# Patient Record
Sex: Female | Born: 1960 | Race: Black or African American | Hispanic: No | Smoking: Never smoker
Health system: Southern US, Community
[De-identification: ages and names within clinical notes are randomized; demographics above are authoritative.]

## PROBLEM LIST (undated history)

## (undated) DIAGNOSIS — Z8619 Personal history of other infectious and parasitic diseases: Secondary | ICD-10-CM

## (undated) DIAGNOSIS — I2510 Atherosclerotic heart disease of native coronary artery without angina pectoris: Secondary | ICD-10-CM

## (undated) DIAGNOSIS — E785 Hyperlipidemia, unspecified: Secondary | ICD-10-CM

## (undated) DIAGNOSIS — F419 Anxiety disorder, unspecified: Secondary | ICD-10-CM

## (undated) DIAGNOSIS — I10 Essential (primary) hypertension: Secondary | ICD-10-CM

## (undated) DIAGNOSIS — F32A Depression, unspecified: Secondary | ICD-10-CM

## (undated) DIAGNOSIS — E0500 Thyrotoxicosis with diffuse goiter without thyrotoxic crisis or storm: Secondary | ICD-10-CM

## (undated) DIAGNOSIS — R87619 Unspecified abnormal cytological findings in specimens from cervix uteri: Secondary | ICD-10-CM

## (undated) DIAGNOSIS — G43909 Migraine, unspecified, not intractable, without status migrainosus: Secondary | ICD-10-CM

## (undated) HISTORY — DX: Essential (primary) hypertension: I10

## (undated) HISTORY — DX: Atherosclerotic heart disease of native coronary artery without angina pectoris: I25.10

## (undated) HISTORY — PX: TUBAL LIGATION: SHX77

## (undated) HISTORY — DX: Unspecified abnormal cytological findings in specimens from cervix uteri: R87.619

## (undated) HISTORY — DX: Migraine, unspecified, not intractable, without status migrainosus: G43.909

## (undated) HISTORY — DX: Anxiety disorder, unspecified: F41.9

## (undated) HISTORY — DX: Depression, unspecified: F32.A

## (undated) HISTORY — DX: Personal history of other infectious and parasitic diseases: Z86.19

## (undated) HISTORY — DX: Hyperlipidemia, unspecified: E78.5

---

## 1997-10-24 ENCOUNTER — Ambulatory Visit (HOSPITAL_COMMUNITY): Admission: RE | Admit: 1997-10-24 | Discharge: 1997-10-24 | Payer: Self-pay | Admitting: Obstetrics and Gynecology

## 2012-06-14 LAB — HM MAMMOGRAPHY

## 2012-06-14 LAB — HM PAP SMEAR

## 2015-05-04 ENCOUNTER — Encounter (HOSPITAL_COMMUNITY): Payer: Self-pay

## 2015-05-04 ENCOUNTER — Emergency Department (HOSPITAL_COMMUNITY): Payer: Self-pay

## 2015-05-04 ENCOUNTER — Emergency Department (HOSPITAL_COMMUNITY)
Admission: EM | Admit: 2015-05-04 | Discharge: 2015-05-04 | Disposition: A | Discharge status: Home / Self Care | Payer: Self-pay | Attending: Emergency Medicine | Admitting: Emergency Medicine

## 2015-05-04 DIAGNOSIS — I10 Essential (primary) hypertension: Secondary | ICD-10-CM | POA: Insufficient documentation

## 2015-05-04 DIAGNOSIS — Z79899 Other long term (current) drug therapy: Secondary | ICD-10-CM | POA: Insufficient documentation

## 2015-05-04 DIAGNOSIS — G43811 Other migraine, intractable, with status migrainosus: Secondary | ICD-10-CM | POA: Insufficient documentation

## 2015-05-04 MED ORDER — METOPROLOL TARTRATE 50 MG PO TABS: 50.0000 mg | ORAL_TABLET | Freq: Two times a day (BID) | ORAL | Status: DC

## 2015-05-04 MED ORDER — SODIUM CHLORIDE 0.9 % IV SOLN
1000.0000 mL | Freq: Once | INTRAVENOUS | Status: AC
  Administered 2015-05-04: 1000 mL via INTRAVENOUS

## 2015-05-04 MED ORDER — LISINOPRIL 10 MG PO TABS
10.0000 mg | ORAL_TABLET | Freq: Once | ORAL | Status: AC
  Administered 2015-05-04: 10 mg via ORAL
  Filled 2015-05-04: qty 1

## 2015-05-04 MED ORDER — DIPHENHYDRAMINE HCL 50 MG/ML IJ SOLN
25.0000 mg | Freq: Once | INTRAMUSCULAR | Status: AC
  Administered 2015-05-04: 25 mg via INTRAVENOUS
  Filled 2015-05-04: qty 1

## 2015-05-04 MED ORDER — HYDROCODONE-ACETAMINOPHEN 5-325 MG PO TABS: 1.0000 | ORAL_TABLET | ORAL | Status: DC | PRN

## 2015-05-04 MED ORDER — MORPHINE SULFATE (PF) 4 MG/ML IV SOLN
4.0000 mg | Freq: Once | INTRAVENOUS | Status: AC
  Administered 2015-05-04: 4 mg via INTRAVENOUS
  Filled 2015-05-04: qty 1

## 2015-05-04 MED ORDER — ONDANSETRON 4 MG PO TBDP: 4.0000 mg | ORAL_TABLET | ORAL | Status: DC | PRN

## 2015-05-04 MED ORDER — METOPROLOL TARTRATE 25 MG PO TABS
50.0000 mg | ORAL_TABLET | Freq: Once | ORAL | Status: AC
  Administered 2015-05-04: 50 mg via ORAL
  Filled 2015-05-04: qty 2

## 2015-05-04 MED ORDER — HYDRALAZINE HCL 20 MG/ML IJ SOLN
10.0000 mg | Freq: Once | INTRAMUSCULAR | Status: AC
  Administered 2015-05-04: 10 mg via INTRAVENOUS
  Filled 2015-05-04: qty 1

## 2015-05-04 MED ORDER — ONDANSETRON HCL 4 MG/2ML IJ SOLN
4.0000 mg | Freq: Once | INTRAMUSCULAR | Status: AC
  Administered 2015-05-04: 4 mg via INTRAVENOUS
  Filled 2015-05-04: qty 2

## 2015-05-04 MED ORDER — SODIUM CHLORIDE 0.9 % IV SOLN
1000.0000 mL | INTRAVENOUS | Status: DC
  Administered 2015-05-04: 1000 mL via INTRAVENOUS

## 2015-05-04 MED ORDER — AMITRIPTYLINE HCL 100 MG PO TABS: 100.0000 mg | ORAL_TABLET | Freq: Every day | ORAL | Status: DC

## 2015-05-04 MED ORDER — AMLODIPINE BESYLATE 5 MG PO TABS
5.0000 mg | ORAL_TABLET | Freq: Once | ORAL | Status: AC
  Administered 2015-05-04: 5 mg via ORAL
  Filled 2015-05-04: qty 1

## 2015-05-04 MED ORDER — METOCLOPRAMIDE HCL 5 MG/ML IJ SOLN
10.0000 mg | Freq: Once | INTRAMUSCULAR | Status: AC
  Administered 2015-05-04: 10 mg via INTRAVENOUS
  Filled 2015-05-04: qty 2

## 2015-05-04 MED ORDER — LISINOPRIL 10 MG PO TABS: 10.0000 mg | ORAL_TABLET | Freq: Every day | ORAL | Status: DC

## 2015-05-04 MED ORDER — HYDROMORPHONE HCL 1 MG/ML IJ SOLN
1.0000 mg | Freq: Once | INTRAMUSCULAR | Status: AC
  Administered 2015-05-04: 1 mg via INTRAVENOUS
  Filled 2015-05-04: qty 1

## 2015-05-04 NOTE — ED Provider Notes (Signed)
CSN: [id number]     Arrival date & time 05/04/15  0610 History   First MD Initiated Contact with Patient 05/04/15 [phone]     Chief Complaint  Patient presents with  . Migraine     (Consider location/radiation/quality/duration/timing/severity/associated sxs/prior Treatment) HPI Patient reports that she was awakened yesterday morning with a migraine headache. She states the headache is generalized and severe. She reports her blood pressure was also elevated. The patient reports that she gets migraines independent of her blood pressure being elevated and that has been a long-term diagnosis for her. She does describe this as a typical migraine. She has had nausea and vomiting in association with this as well as light sensitivity. She denies any gait dysfunction, focal weakness or numbness. The patient reports that due to lack of insurance coverage she has gone to taking her blood pressure medications every other day to try to cover the gap. She reports by doing that, her blood pressures usually fairly well controlled. She takes clonidine on an as-needed basis and otherwise takes amlodipine and metoprolol. Her migraine medication is diclofenac. She denies chest pain or shortness of breath. No abdominal pain. No lower extremity swelling or pain. Past Medical History  Diagnosis Date  . Migraines   . Hypertension    History reviewed. No pertinent past surgical history. History reviewed. No pertinent family history. Social History  Substance Use Topics  . Smoking status: Never Smoker   . Smokeless tobacco: None  . Alcohol Use: No   OB History    No data available     Review of Systems 10 Systems reviewed and are negative for acute change except as noted in the HPI.    Allergies  Review of patient's allergies indicates no known allergies.  Home Medications   Prior to Admission medications   Medication Sig Start Date End Date Taking? Authorizing Provider  amitriptyline (ELAVIL) 100 MG  tablet Take 100 mg by mouth at bedtime.   Yes Historical Provider, MD  amitriptyline (ELAVIL) 100 MG tablet Take 1 tablet (100 mg total) by mouth at bedtime. 05/04/15   Arby BarretteMarcy Marissa Weaver, MD  amLODipine (NORVASC) 5 MG tablet Take 5 mg by mouth daily.   Yes Historical Provider, MD  cloNIDine (CATAPRES) 0.1 MG tablet Take 0.1 mg by mouth daily as needed (for hypertension).   Yes Historical Provider, MD  diclofenac (VOLTAREN) 50 MG EC tablet Take 50 mg by mouth 2 (two) times daily as needed for moderate pain.   Yes Historical Provider, MD  divalproex (DEPAKOTE) 250 MG DR tablet Take 250 mg by mouth 2 (two) times daily.   Yes Historical Provider, MD  HYDROcodone-acetaminophen (NORCO/VICODIN) 5-325 MG tablet Take 1-2 tablets by mouth every 4 (four) hours as needed for moderate pain or severe pain. 05/04/15   Arby BarretteMarcy Ryen Rhames, MD  lisinopril (PRINIVIL,ZESTRIL) 10 MG tablet Take 1 tablet (10 mg total) by mouth daily. 05/04/15   Arby BarretteMarcy Denay Pleitez, MD  metoprolol (LOPRESSOR) 50 MG tablet Take 1 tablet (50 mg total) by mouth 2 (two) times daily. 05/04/15   Arby BarretteMarcy Genny Caulder, MD  metoprolol succinate (TOPROL-XL) 50 MG 24 hr tablet Take 50 mg by mouth daily. Take with or immediately following a meal.   Yes Historical Provider, MD  ondansetron (ZOFRAN ODT) 4 MG disintegrating tablet Take 1 tablet (4 mg total) by mouth every 4 (four) hours as needed for nausea or vomiting. 05/04/15   Arby BarretteMarcy Lucca Greggs, MD   BP 136/80 mmHg  Pulse 78  Temp(Src) 98.1 F (36.7 C) (  Oral)  Resp 16  Ht  (1.702 m)  Wt 140 lb (63.504 kg)  BMI 21.92 kg/m2  SpO2 95% Physical Exam  Constitutional: She is oriented to person, place, and time. She appears well-developed and well-nourished.  Patient is lying supine on her right side. She appears uncomfortable. She is alert and appropriate. She is nontoxic. No respiratory distress.  HENT:  Head: Normocephalic and atraumatic.  Right Ear: External ear normal.  Left Ear: External ear normal.   Nose: Nose normal.  Mouth/Throat: Oropharynx is clear and moist.  Eyes: EOM are normal. Pupils are equal, round, and reactive to light.  Neck: Neck supple.  Cardiovascular: Normal rate, regular rhythm, normal heart sounds and intact distal pulses.   Pulmonary/Chest: Effort normal and breath sounds normal.  Abdominal: Soft. Bowel sounds are normal. She exhibits no distension. There is no tenderness.  Musculoskeletal: Normal range of motion. She exhibits no edema or tenderness.  Neurological: She is alert and oriented to person, place, and time. She has normal strength. No cranial nerve deficit. She exhibits normal muscle tone. Coordination normal. GCS eye subscore is 4. GCS verbal subscore is 5. GCS motor subscore is 6.  Skin: Skin is warm, dry and intact.  Psychiatric: She has a normal mood and affect.    ED Course  Procedures (including critical care time) Labs Review Labs Reviewed - No data to display  Imaging Review No results found. I have personally reviewed and evaluated these images and lab results as part of my medical decision-making.   EKG Interpretation None     08:15 patient recheck. Patient has just received ordered medications thus no change yet. Recheck 09:00. Patient denies any significant improvement. Will add narcotic pain medication and additional antihypertensive as well as CT head. She is alert and appropriate without confusion. Still appears to be in pain. Recheck 10:35 patient reports headache has improved significantly now. Mental status clear. CT head negative Recheck 12:40 patient now starting to feel a significant improved. She feels this point that her migraine actually be resolving. MDM   Final diagnoses:  Essential hypertension  Other migraine with status migrainosus, intractable   Patient reports a similar episodes in the past. She identifies a history of both migraines as well as hypertension. Due to financial constraints, the patient has been  taking her blood pressure medications every other day. At this time she does present with headache but no neurologic dysfunction. Due to the patient's hypertension CT head was obtained and found to be negative. Treatment was approached both by managing hypertension as well as pain. Patient's mental status remained clear and once blood pressure and pain were managed, symptoms resolved. At this time she has been provided with the resource guide for community medical resources. Patient is encouraged to work with case management to identify weight have adequate blood pressure medications on a regular basis. I have filled all medications as closely as possible using the $4 list to assist the patient through this next month. She is counseled on signs and symptoms for which return.    Arby Barrette, MD 05/14/15 [phone]

## 2015-05-04 NOTE — ED Notes (Signed)
Patient transported to CT 

## 2015-05-04 NOTE — ED Notes (Signed)
Pt is in stable condition upon d/c and ambulates from ED. 

## 2015-05-04 NOTE — Discharge Instructions (Signed)
Hypertension Hypertension, commonly called high blood pressure, is when the force of blood pumping through your arteries is too strong. Your arteries are the blood vessels that carry blood from your heart throughout your body. A blood pressure reading consists of a higher number over a lower number, such as 110/72. The higher number (systolic) is the pressure inside your arteries when your heart pumps. The lower number (diastolic) is the pressure inside your arteries when your heart relaxes. Ideally you want your blood pressure below 120/80. Hypertension forces your heart to work harder to pump blood. Your arteries may become narrow or stiff. Having untreated or uncontrolled hypertension can cause heart attack, stroke, kidney disease, and other problems. RISK FACTORS Some risk factors for high blood pressure are controllable. Others are not.  Risk factors you cannot control include:   Race. You may be at higher risk if you are African American.  Age. Risk increases with age.  Gender. Men are at higher risk than women before age 45 years. After age 65, women are at higher risk than men. Risk factors you can control include:  Not getting enough exercise or physical activity.  Being overweight.  Getting too much fat, sugar, calories, or salt in your diet.  Drinking too much alcohol. SIGNS AND SYMPTOMS Hypertension does not usually cause signs or symptoms. Extremely high blood pressure (hypertensive crisis) may cause headache, anxiety, shortness of breath, and nosebleed. DIAGNOSIS To check if you have hypertension, your health care provider will measure your blood pressure while you are seated, with your arm held at the level of your heart. It should be measured at least twice using the same arm. Certain conditions can cause a difference in blood pressure between your right and left arms. A blood pressure reading that is higher than normal on one occasion does not mean that you need treatment. If  it is not clear whether you have high blood pressure, you may be asked to return on a different day to have your blood pressure checked again. Or, you may be asked to monitor your blood pressure at home for 1 or more weeks. TREATMENT Treating high blood pressure includes making lifestyle changes and possibly taking medicine. Living a healthy lifestyle can help lower high blood pressure. You may need to change some of your habits. Lifestyle changes may include:  Following the DASH diet. This diet is high in fruits, vegetables, and whole grains. It is low in salt, red meat, and added sugars.  Keep your sodium intake below 2,300 mg per day.  Getting at least 30-45 minutes of aerobic exercise at least 4 times per week.  Losing weight if necessary.  Not smoking.  Limiting alcoholic beverages.  Learning ways to reduce stress. Your health care provider may prescribe medicine if lifestyle changes are not enough to get your blood pressure under control, and if one of the following is true:  You are 18-59 years of age and your systolic blood pressure is above 140.  You are 60 years of age or older, and your systolic blood pressure is above 150.  Your diastolic blood pressure is above 90.  You have diabetes, and your systolic blood pressure is over 140 or your diastolic blood pressure is over 90.  You have kidney disease and your blood pressure is above 140/90.  You have heart disease and your blood pressure is above 140/90. Your personal target blood pressure may vary depending on your medical conditions, your age, and other factors. HOME CARE INSTRUCTIONS    Have your blood pressure rechecked as directed by your health care provider.   Take medicines only as directed by your health care provider. Follow the directions carefully. Blood pressure medicines must be taken as prescribed. The medicine does not work as well when you skip doses. Skipping doses also puts you at risk for  problems.  Do not smoke.   Monitor your blood pressure at home as directed by your health care provider. SEEK MEDICAL CARE IF:   You think you are having a reaction to medicines taken.  You have recurrent headaches or feel dizzy.  You have swelling in your ankles.  You have trouble with your vision. SEEK IMMEDIATE MEDICAL CARE IF:  You develop a severe headache or confusion.  You have unusual weakness, numbness, or feel faint.  You have severe chest or abdominal pain.  You vomit repeatedly.  You have trouble breathing. MAKE SURE YOU:   Understand these instructions.  Will watch your condition.  Will get help right away if you are not doing well or get worse.   This information is not intended to replace advice given to you by your health care provider. Make sure you discuss any questions you have with your health care provider.   Document Released: 05/31/2005 Document Revised: 10/15/2014 Document Reviewed: 03/23/2013 Elsevier Interactive Patient Education 2016 [employer]. Migraine Headache A migraine headache is an intense, throbbing pain on one or both sides of your head. A migraine can last for 30 minutes to several hours. CAUSES  The exact cause of a migraine headache is not always known. However, a migraine may be caused when nerves in the brain become irritated and release chemicals that cause inflammation. This causes pain. Certain things may also trigger migraines, such as:  Alcohol.  Smoking.  Stress.  Menstruation.  Aged cheeses.  Foods or drinks that contain nitrates, glutamate, aspartame, or tyramine.  Lack of sleep.  Chocolate.  Caffeine.  Hunger.  Physical exertion.  Fatigue.  Medicines used to treat chest pain (nitroglycerine), birth control pills, estrogen, and some blood pressure medicines. SIGNS AND SYMPTOMS  Pain on one or both sides of your head.  Pulsating or throbbing pain.  Severe pain that prevents daily  activities.  Pain that is aggravated by any physical activity.  Nausea, vomiting, or both.  Dizziness.  Pain with exposure to bright lights, loud noises, or activity.  General sensitivity to bright lights, loud noises, or smells. Before you get a migraine, you may get warning signs that a migraine is coming (aura). An aura may include:  Seeing flashing lights.  Seeing bright spots, halos, or zigzag lines.  Having tunnel vision or blurred vision.  Having feelings of numbness or tingling.  Having trouble talking.  Having muscle weakness. DIAGNOSIS  A migraine headache is often diagnosed based on:  Symptoms.  Physical exam.  A CT scan or MRI of your head. These imaging tests cannot diagnose migraines, but they can help rule out other causes of headaches. TREATMENT Medicines may be given for pain and nausea. Medicines can also be given to help prevent recurrent migraines.  HOME CARE INSTRUCTIONS  Only take over-the-counter or prescription medicines for pain or discomfort as directed by your health care provider. The use of long-term narcotics is not recommended.  Lie down in a dark, quiet room when you have a migraine.  Keep a journal to find out what may trigger your migraine headaches. For example, write down:  What you eat and drink.  How much  sleep you get.  Any change to your diet or medicines.  Limit alcohol consumption.  Quit smoking if you smoke.  Get 7-9 hours of sleep, or as recommended by your health care provider.  Limit stress.  Keep lights dim if bright lights bother you and make your migraines worse. SEEK IMMEDIATE MEDICAL CARE IF:   Your migraine becomes severe.  You have a fever.  You have a stiff neck.  You have vision loss.  You have muscular weakness or loss of muscle control.  You start losing your balance or have trouble walking.  You feel faint or pass out.  You have severe symptoms that are different from your first  symptoms. MAKE SURE YOU:   Understand these instructions.  Will watch your condition.  Will get help right away if you are not doing well or get worse.   This information is not intended to replace advice given to you by your health care provider. Make sure you discuss any questions you have with your health care provider.   Document Released: 05/31/2005 Document Revised: 06/21/2014 Document Reviewed: 02/05/2013 Elsevier Interactive Patient Education 2016 [employer].  Emergency Department Resource Guide 1) Find a Doctor and Pay Out of Pocket Although you won't have to find out who is covered by your insurance plan, it is a good idea to ask around and get recommendations. You will then need to call the office and see if the doctor you have chosen will accept you as a new patient and what types of options they offer for patients who are self-pay. Some doctors offer discounts or will set up payment plans for their patients who do not have insurance, but you will need to ask so you aren't surprised when you get to your appointment.  2) Contact Your Local Health Department Not all health departments have doctors that can see patients for sick visits, but many do, so it is worth a call to see if yours does. If you don't know where your local health department is, you can check in your phone book. The CDC also has a tool to help you locate your state's health department, and many state websites also have listings of all of their local health departments.  3) Find a Walk-in Clinic If your illness is not likely to be very severe or complicated, you may want to try a walk in clinic. These are popping up all over the country in pharmacies, drugstores, and shopping centers. They're usually staffed by nurse practitioners or physician assistants that have been trained to treat common illnesses and complaints. They're usually fairly quick and inexpensive. However, if you have serious medical issues or  chronic medical problems, these are probably not your best option.  No Primary Care Doctor: - Call Health Connect at  [phone] - they can help you locate a primary care doctor that  accepts your insurance, provides certain services, etc. - Physician Referral Service- [phone]  Chronic Pain Problems: Organization         Address  Phone   Notes  [facility]  [phone] Patients need to be referred by their primary care doctor.   Medication Assistance: Organization         Address  Phone   Notes  [facility] Medication [facility] [address] [place] [place], [place] [address] [phone] --Must be a resident of [facility] -- Must have NO insurance coverage whatsoever (no Medicaid/ Medicare, etc.) -- The pt. MUST  have a primary care doctor that directs their care regularly and follows them in the community   [facility]ist  [phone]   [employer]  [phone]    Agencies that provide inexpensive medical care: Organization         Address  Phone   Notes  [facility]  [phone]   [facility]    [phone]   [facility] [place] [address] [place], [place] [address] [phone]   [facility] [address] [place], [place] [phone]   Planned Parenthood    [phone]   [facility]    [phone]   Community Health and [facility]  [address], [place] Phone:  [phone], Fax:  [phone] Hours of Operation:  9 am - 6 pm, M-F.  Also accepts Medicaid/Medicare and self-pay.  [facility]  [address], [place] Phone: [phone], Fax: [phone]. Hours of Operation:  8:30 am - 5:30 pm, M-F.  Also accepts Medicaid and self-pay.  [facility] [place] [address] [place] Phone: [phone]   [facility] [address] Natasha Bence [place], [place]  [phone], Ext. [phone] Mondays & Thursdays: 7-9 AM.  First 15 patients are seen on a first come, first serve basis.    Medicaid-accepting [facility] Providers:  Organization         Address  Phone   Notes  [facility] [address], [place] [phone] Also accepts self-pay patients.  [facility] [address] Laurell Josephs [place], [place]  [phone]   [facility] [address], [place] [phone]   [facility] [address] [place] [phone]   Renaye Rakers [address], [place]   [phone] Only accepts [place] Access [place] patients after they have their name applied to their card.   Self-Pay (no insurance) in [facility]:  Organization         Address  Phone   Notes  Sickle Cell Patients, [facility] [address] [place], [place] [phone]   [facility] [address] [place], [place] [phone]   [facility]  [address], [place] [phone]   [facility]/Dr. Osei-Bonsu  [address], [place] or [address], [place] [phone] Phone number for both [place] and [place] locations is the same.  Urgent Medical and [facility] [address] [place] [phone]   [facility] [address], [place] or [address] [phone]   [facility] [address] [place] [phone], phone; [phone], fax Sees patients 1st and 3rd Saturday of every month.  Must not qualify for public or private insurance (i.e. Medicaid, Medicare, [place] Health Choice, Veterans' Benefits)  Household income should be no more than 200% of the poverty level The clinic cannot treat you if you are pregnant or think you are pregnant  Sexually transmitted  diseases are not treated at the clinic.    Dental Care: Organization         Address  Phone  Notes  [facility] Department of [facility] [address] [place],  [place] [phone] Accepts children up to age 52 who are enrolled in Medicaid or [place] Health Choice; pregnant women with a Medicaid card; and children who have applied for Medicaid or [place] Health Choice, but were declined, whose parents can pay a reduced fee at time of service.  [facility] [place]  [address], [place] [phone] Accepts children up to age 52 who are enrolled in [place] or [place] Health Choice; pregnant women with a Medicaid card; and children who have applied for Medicaid or [place] Health Choice, but were declined, whose parents can pay a reduced fee at time of service.  [facility]  [address], [place] [phone] Patients are seen by appointment only. Walk-ins are not accepted. [facility] will see patients 52 years of age and older. Monday - Tuesday (8am-5pm) Most Wednesdays (8:30-5pm) $30 per visit, cash only  [facility]  [address], [facility] [place] [phone] Patients are seen by appointment only. Walk-ins are not accepted. [facility] will see patients 52 years of age and older. One Wednesday Evening (Monthly: Volunteer Based).  $30 per visit, cash only  [employer] [facility] [facility]  [phone] for adults; Children under age 174, call Graduate Pediatric Dentistry at [phone]. Children aged 904-14, please call [phone] to request a pediatric application.  Dental services are provided in all areas of dental care including fillings, crowns and bridges, complete and partial dentures, implants, gum treatment, root canals, and extractions. Preventive care is also provided. Treatment is provided to both adults and children. Patients are selected via a  lottery and there is often a waiting list.   [facility] [address], [place]  [phone] [email or web address]   [facility] [address], [place], [place] [phone], Ext. [phone] Second and Fourth Thursday of each month, opens at 6:30 AM; Clinic ends at 9 AM.  Patients are seen on a first-come first-served basis, and a limited number are seen during each clinic.   [facility]  [address], [place], [place] [phone]   Eligibility Requirements You must have lived in [place], [place], or [place] counties for at least the last three months.   You cannot be eligible for state or federal sponsored [place]healthcare insurance, including CIGNAVeterans Administration, [place]icaid, or [place]icare.   You generally cannot be eligible for healthcare insurance through your employer.    How to apply: Eligibility screenings are held every Tuesday and Wednesday afternoon from 1:00 pm until 4:00 pm. You do not need an appointment for the interview!  [facility] [address], [place], [place] [phone]   [facility]  [phone]   [facility]  [phone]   [facility]  [phone]    Behavioral Health Resources in the Community: Intensive Outpatient Programs Organization         Address  Phone  Notes  [facility] [address], [place], [place] [phone]   [facility] [address], [place], [place] [phone]   [facility] [address], [place], [place]  [phone]   [facility] [address],  [place], [place] [phone] or [phone]   Substance Abuse Resources Organization         Address  Phone  Notes  [facility] Drug Services  [phone]   [facility]  [phone]   [facility] [place]  [phone]   Floydene Flock  [phone]   Residential &  Outpatient Substance Abuse Program  [phone]   Psychological Services Organization         Address  Phone  Notes  [facility] Health  [phone]   [facility]  [phone]   [facility] [address], [place] [phone] or [phone]    Mobile Crisis Teams Organization         Address  Phone  Notes  [facility], Mobile Crisis Care Unit  [phone]   [facility]  [address] [place], [place] [phone]   Doristine Locks [address] [place] [phone]    Self-Help/Support Groups Organization         Address  Phone             Notes  [facility] of [place] - variety of support groups  336- I7437963 Call for more information  Narcotics Anonymous (NA), [facility] [address], [employer] [place]  2 meetings at this location   Statistician         Address  Phone  Notes  [facility] [address],    [place]  [phone]   [facility]  [address], [place] [address], [place], [place] [phone]   [facility] [address] [place], [place] [phone] Admissions: 8am-3pm M-F  [facility] [address]    [place], [place] [phone]   [facility] [address] [place], [place], [place] [phone]   [facility] [address],  [place], [place] [phone]   [facility] - Intensive Outpatient [address]ure[address], [place], [place] [phone]   [facility] ([facility]iction [facility] [address] [place]  [place], [place] [phone] or [phone]   [facility] ([facility]) [address], [place] [employer] [place], [place] [phone] Accepts Medicaid  [facility] [address],  [place] [phone] Substance Abuse/Addiction Treatment   [facility] Organization          Address  Phone  Notes  [facility]  [phone]   Angie Fava, PhD [address] Ervin [place], [place]   [phone] or [phone]   [facility] Behavioral   [address] [place], [place] [phone]   [facility] [address], [place], [place] [phone] Insurance/Medicaid/sponsorship through [facility] and Families [address]                                    [place], [place] [phone] Therapy/tele-psych/case  [facility] [address], [place] [phone]    Dr. Lolly Mustache  [phone]   [facility]  [employer] [facility] Dept. 1) [address] [place] 2) [address], [place] 3)  [address], [place] [phone]  [phone]   [facility] Child Abuse Hotline (480) 321-0341 or [phone] (After Hours)

## 2015-05-04 NOTE — ED Notes (Addendum)
Pt states that she woke up yesterday morning with a migraine and has high blood pressure. Pt has a hx of both. Pt reports multiple episodes of vomiting over the night. Pt states that she has not taken anything for the migraine. Neuro in tact. Pt states that she has only been taking her BP medications every other day due to income/insurance reasons. Pt states that she was [place] for 1 week [place] ago [place] due to high BP.

## 2015-05-04 NOTE — ED Notes (Signed)
Waiting for case management to speak with pt rt inability to afford BP meds.

## 2015-05-29 ENCOUNTER — Ambulatory Visit: Payer: Self-pay | Admitting: Internal Medicine

## 2015-05-29 ENCOUNTER — Encounter (HOSPITAL_BASED_OUTPATIENT_CLINIC_OR_DEPARTMENT_OTHER): Payer: Self-pay | Admitting: *Deleted

## 2015-05-29 ENCOUNTER — Telehealth: Payer: Self-pay

## 2015-05-29 ENCOUNTER — Emergency Department (HOSPITAL_BASED_OUTPATIENT_CLINIC_OR_DEPARTMENT_OTHER)
Admission: EM | Admit: 2015-05-29 | Discharge: 2015-05-29 | Disposition: A | Discharge status: Home / Self Care | Payer: Self-pay | Attending: Emergency Medicine | Admitting: Emergency Medicine

## 2015-05-29 ENCOUNTER — Other Ambulatory Visit: Payer: Self-pay

## 2015-05-29 DIAGNOSIS — I10 Essential (primary) hypertension: Secondary | ICD-10-CM | POA: Insufficient documentation

## 2015-05-29 DIAGNOSIS — Z79899 Other long term (current) drug therapy: Secondary | ICD-10-CM | POA: Insufficient documentation

## 2015-05-29 DIAGNOSIS — R519 Headache, unspecified: Secondary | ICD-10-CM

## 2015-05-29 DIAGNOSIS — H53149 Visual discomfort, unspecified: Secondary | ICD-10-CM | POA: Insufficient documentation

## 2015-05-29 DIAGNOSIS — R51 Headache: Secondary | ICD-10-CM

## 2015-05-29 DIAGNOSIS — R112 Nausea with vomiting, unspecified: Secondary | ICD-10-CM | POA: Insufficient documentation

## 2015-05-29 LAB — CBC WITH DIFFERENTIAL/PLATELET
Basophils Absolute: 0 10*3/uL (ref 0.0–0.1)
Basophils Relative: 0 %
Eosinophils Absolute: 0 10*3/uL (ref 0.0–0.7)
Eosinophils Relative: 0 %
HCT: 37.2 % (ref 36.0–46.0)
Hemoglobin: 12.4 g/dL (ref 12.0–15.0)
Lymphocytes Relative: 12 %
Lymphs Abs: 0.6 10*3/uL — ABNORMAL LOW (ref 0.7–4.0)
MCH: 28.6 pg (ref 26.0–34.0)
MCHC: 33.3 g/dL (ref 30.0–36.0)
MCV: 85.9 fL (ref 78.0–100.0)
Monocytes Absolute: 0.3 10*3/uL (ref 0.1–1.0)
Monocytes Relative: 5 %
Neutro Abs: 4.5 10*3/uL (ref 1.7–7.7)
Neutrophils Relative %: 83 %
Platelets: 234 10*3/uL (ref 150–400)
RBC: 4.33 MIL/uL (ref 3.87–5.11)
RDW: 13.4 % (ref 11.5–15.5)
WBC: 5.4 10*3/uL (ref 4.0–10.5)

## 2015-05-29 LAB — BASIC METABOLIC PANEL
Anion gap: 10 (ref 5–15)
BUN: 14 mg/dL (ref 6–20)
CO2: 24 mmol/L (ref 22–32)
Calcium: 9.2 mg/dL (ref 8.9–10.3)
Chloride: 105 mmol/L (ref 101–111)
Creatinine, Ser: 0.73 mg/dL (ref 0.44–1.00)
GFR calc Af Amer: >60 mL/min (ref >60)
GFR calc non Af Amer: >60 mL/min (ref >60)
Glucose, Bld: 123 mg/dL — ABNORMAL HIGH (ref 65–99)
Potassium: 3.4 mmol/L — ABNORMAL LOW (ref 3.5–5.1)
Sodium: 139 mmol/L (ref 135–145)

## 2015-05-29 MED ORDER — LABETALOL HCL 5 MG/ML IV SOLN
10.0000 mg | Freq: Once | INTRAVENOUS | Status: AC
  Administered 2015-05-29: 10 mg via INTRAVENOUS
  Filled 2015-05-29: qty 4

## 2015-05-29 MED ORDER — DIPHENHYDRAMINE HCL 50 MG/ML IJ SOLN
25.0000 mg | Freq: Once | INTRAMUSCULAR | Status: AC
Start: 2015-05-29 — End: 2015-05-29
  Administered 2015-05-29: 25 mg via INTRAVENOUS
  Filled 2015-05-29: qty 1

## 2015-05-29 MED ORDER — SODIUM CHLORIDE 0.9 % IV BOLUS (SEPSIS)
1000.0000 mL | Freq: Once | INTRAVENOUS | Status: AC
  Administered 2015-05-29: 1000 mL via INTRAVENOUS

## 2015-05-29 MED ORDER — KETOROLAC TROMETHAMINE 30 MG/ML IJ SOLN
30.0000 mg | Freq: Once | INTRAMUSCULAR | Status: AC
  Administered 2015-05-29: 30 mg via INTRAVENOUS
  Filled 2015-05-29: qty 1

## 2015-05-29 MED ORDER — PROCHLORPERAZINE EDISYLATE 5 MG/ML IJ SOLN
10.0000 mg | Freq: Once | INTRAMUSCULAR | Status: AC
  Administered 2015-05-29: 10 mg via INTRAVENOUS
  Filled 2015-05-29: qty 2

## 2015-05-29 MED ORDER — POTASSIUM CHLORIDE CRYS ER 20 MEQ PO TBCR
40.0000 meq | EXTENDED_RELEASE_TABLET | Freq: Once | ORAL | Status: AC
  Administered 2015-05-29: 40 meq via ORAL
  Filled 2015-05-29: qty 2

## 2015-05-29 NOTE — Telephone Encounter (Signed)
thx

## 2015-05-29 NOTE — ED Provider Notes (Signed)
CSN: [id number]     Arrival date & time 05/29/15  1014 History   First MD Initiated Contact with Patient 05/29/15 1040     Chief Complaint  Patient presents with  . Migraine     (Consider location/radiation/quality/duration/timing/severity/associated sxs/prior Treatment) HPI  54 year old female with a history of migraines and hypertension presents with a recurrent left-sided migraine this started in the middle the night. Was milder and has progressively worsened. Feels very similar to prior migraines. Patient has photophobia, nausea with vomiting, and the left-sided aching headache. Pain is in her left neck as well but she states this pretty typical. She denies weakness but states she has numbness in her right fingertips. She also endorses this is happened multiple times with her migraines. Her blood pressure has been in the 200s/100s while checking it at work. This is been ongoing for last 3 days. Denies chest pain or shortness of breath. She has been taking all the medicine she was given last time she was here on 11/30 for hypertension. She has a PCP appointment next month. No fevers or chills. She has migrated about once a month and this feels pretty typical.  Past Medical History  Diagnosis Date  . Migraines   . Hypertension    History reviewed. No pertinent past surgical history. History reviewed. No pertinent family history. Social History  Substance Use Topics  . Smoking status: Never Smoker   . Smokeless tobacco: None  . Alcohol Use: No   OB History    No data available     Review of Systems  Constitutional: Negative for fever.  Eyes: Positive for photophobia. Negative for visual disturbance.  Respiratory: Negative for shortness of breath.   Cardiovascular: Negative for chest pain.  Gastrointestinal: Positive for nausea and vomiting.  Neurological: Positive for numbness and headaches. Negative for weakness.  All other systems reviewed and are negative.     Allergies   Review of patient's allergies indicates no known allergies.  Home Medications   Prior to Admission medications   Medication Sig Start Date End Date Taking? Authorizing Provider  amitriptyline (ELAVIL) 100 MG tablet Take 1 tablet (100 mg total) by mouth at bedtime. 05/04/15   Arby Barrette, MD  amLODipine (NORVASC) 5 MG tablet Take 5 mg by mouth daily.    Historical Provider, MD  cloNIDine (CATAPRES) 0.1 MG tablet Take 0.1 mg by mouth daily as needed (for hypertension).    Historical Provider, MD  diclofenac (VOLTAREN) 50 MG EC tablet Take 50 mg by mouth 2 (two) times daily as needed for moderate pain.    Historical Provider, MD  divalproex (DEPAKOTE) 250 MG DR tablet Take 250 mg by mouth 2 (two) times daily.    Historical Provider, MD  HYDROcodone-acetaminophen (NORCO/VICODIN) 5-325 MG tablet Take 1-2 tablets by mouth every 4 (four) hours as needed for moderate pain or severe pain. 05/04/15   Arby Barrette, MD  lisinopril (PRINIVIL,ZESTRIL) 10 MG tablet Take 1 tablet (10 mg total) by mouth daily. 05/04/15   Arby Barrette, MD  metoprolol (LOPRESSOR) 50 MG tablet Take 1 tablet (50 mg total) by mouth 2 (two) times daily. 05/04/15   Arby Barrette, MD  ondansetron (ZOFRAN ODT) 4 MG disintegrating tablet Take 1 tablet (4 mg total) by mouth every 4 (four) hours as needed for nausea or vomiting. 05/04/15   Arby Barrette, MD   BP 180/123 mmHg  Pulse 93  Temp(Src) 98.1 F (36.7 C) (Oral)  Resp 16  Ht  (1.702 m)  Wt  140 lb (63.504 kg)  BMI 21.92 kg/m2 Physical Exam  Constitutional: She is oriented to person, place, and time. She appears well-developed and well-nourished. No distress.  HENT:  Head: Normocephalic and atraumatic.  Right Ear: External ear normal.  Left Ear: External ear normal.  Nose: Nose normal.  Eyes: EOM are normal. Pupils are equal, round, and reactive to light. Right eye exhibits no discharge. Left eye exhibits no discharge.  Neck: Normal range of motion. Neck  supple.  Cardiovascular: Normal rate, regular rhythm and normal heart sounds.   Pulmonary/Chest: Effort normal and breath sounds normal.  Abdominal: Soft. She exhibits no distension. There is no tenderness.  Neurological: She is alert and oriented to person, place, and time.  CN 2-12 grossly intact. 5/5 strength in all 4 extremities. Grossly normal sensation, including fingers. Normal finger to nose  Skin: Skin is warm and dry. She is not diaphoretic.  Nursing note and vitals reviewed.   ED Course  Procedures (including critical care time) Labs Review Labs Reviewed  BASIC METABOLIC PANEL - Abnormal; Notable for the following:    Potassium 3.4 (*)    Glucose, Bld 123 (*)    All other components within normal limits  CBC WITH DIFFERENTIAL/PLATELET - Abnormal; Notable for the following:    Lymphs Abs 0.6 (*)    All other components within normal limits    Imaging Review No results found. I have personally reviewed and evaluated these images and lab results as part of my medical decision-making.   EKG Interpretation None      MDM   Final diagnoses:  Left-sided headache  Hypertension, essential    Patient with recurrent left-sided, typical migraine headache with hypertension. She endorses taking all of the medicine she was prescribed just a few weeks ago. Patient has no neuro deficits on my exam. Low suspicion for a subarachnoid hemorrhage, stroke, or other bleed or infection. She states all the symptoms, including hypertension, are typical of her migraines. Good pain control with treatment in the ER. She was given a dose of labetalol given rising blood pressure and continued headache. Given she is now a symptomatically plan discharge with close follow-up with PCP as well as strict return precautions.    Pricilla LovelessScott Livy Ross, MD 05/29/15 1310

## 2015-05-29 NOTE — ED Notes (Signed)
Pt reports her usual migraine symptoms with photophobia and nausea x this am. Pt states her bp has also been high per her home machine over the last few days "210's".

## 2015-05-29 NOTE — Telephone Encounter (Signed)
New patient appt with Dr. Abner GreenspanBlyth scheduled 07/02/15.    Hx. Migraine, essential hypertension, recent ER visit 05/04/15-Essential HTN and Migraine  Pt came in for an acute visit with Dr. Drue NovelPaz today at 10:45 am with complaints of migraines.  Pt arrived early stating that she has a migraine, is not feeling well with elevated blood pressure.   Husband and daughter present.  Pt was brought into procedure room for an assessment by nurse.  Pt states she woke up early this morning around 2 am with a migraine headache, nausea, dizziness and elevated blood pressure.   She reports the same now.    Vomited once in procedures room. Emesis: thick clear/greenish sputum.  Pt states has not eaten or drank anything this morning.   BP: 208/129.  HR: 86.  Pt is alert and oriented x 4.  Reported the same to Dr. Drue NovelPaz.  Verbal order given to escort pt to ER.  Pt was escorted to ER via wheelchair.

## 2015-05-30 NOTE — Telephone Encounter (Signed)
thanks

## 2015-07-02 ENCOUNTER — Ambulatory Visit: Payer: Self-pay | Admitting: Family Medicine

## 2015-07-04 ENCOUNTER — Telehealth: Payer: Self-pay | Admitting: Family Medicine

## 2015-07-04 NOTE — Telephone Encounter (Signed)
NO charge no show

## 2015-07-04 NOTE — Telephone Encounter (Signed)
Pt was no show for new pt appt 07/02/15 10:00am, pt has not rescheduled, charge or no charge? Ok to reschedule with you if pt calls in?

## 2015-07-08 NOTE — Telephone Encounter (Signed)
Pt's brother called in to schedule an appt with Dr. Abner Greenspan. Informed him that Dr. B is no longer accepting new patients. She (pt) is more then welcome to get established with a different provider. Suggested one of the new providers to him. He says that he will speak with his sister and let us know.

## 2015-07-16 ENCOUNTER — Telehealth: Payer: Self-pay | Admitting: Family Medicine

## 2015-07-16 NOTE — Telephone Encounter (Signed)
Pt's brother Erby Pian is a current pt of Dr. Leonard Schwartz. He says that during his appt he spoke w/ provider about scheduling pt a new pt appt. He says that provider OKay'd it and asked front desk to schedule.

## 2015-07-28 ENCOUNTER — Telehealth: Payer: Self-pay | Admitting: General Practice

## 2015-07-28 ENCOUNTER — Encounter: Payer: Self-pay | Admitting: General Practice

## 2015-07-28 NOTE — Telephone Encounter (Signed)
Pre-visit phone call for pt completed. Chart updated to reflect.   

## 2015-07-29 ENCOUNTER — Ambulatory Visit (INDEPENDENT_AMBULATORY_CARE_PROVIDER_SITE_OTHER): Payer: 59 | Admitting: Family Medicine

## 2015-07-29 ENCOUNTER — Encounter: Payer: Self-pay | Admitting: Family Medicine

## 2015-07-29 VITALS — BP 162/110 | HR 79 | Temp 98.0°F | Ht 67.0 in | Wt 155.4 lb

## 2015-07-29 DIAGNOSIS — I10 Essential (primary) hypertension: Secondary | ICD-10-CM | POA: Diagnosis not present

## 2015-07-29 DIAGNOSIS — G43809 Other migraine, not intractable, without status migrainosus: Secondary | ICD-10-CM

## 2015-07-29 DIAGNOSIS — R87619 Unspecified abnormal cytological findings in specimens from cervix uteri: Secondary | ICD-10-CM

## 2015-07-29 DIAGNOSIS — Z8619 Personal history of other infectious and parasitic diseases: Secondary | ICD-10-CM | POA: Insufficient documentation

## 2015-07-29 MED ORDER — PROMETHAZINE HCL 25 MG PO TABS: 25.0000 mg | ORAL_TABLET | Freq: Three times a day (TID) | ORAL | Status: DC | PRN

## 2015-07-29 MED ORDER — METOPROLOL SUCCINATE ER 200 MG PO TB24: 200.0000 mg | ORAL_TABLET | Freq: Every day | ORAL | Status: DC

## 2015-07-29 MED ORDER — HYDROCODONE-ACETAMINOPHEN 5-325 MG PO TABS: 1.0000 | ORAL_TABLET | ORAL | Status: DC | PRN

## 2015-07-29 MED ORDER — BUTALBITAL-ASA-CAFFEINE 50-325-40 MG PO CAPS: 1.0000 | ORAL_CAPSULE | Freq: Two times a day (BID) | ORAL | Status: DC | PRN

## 2015-07-29 MED ORDER — AMITRIPTYLINE HCL 100 MG PO TABS: 100.0000 mg | ORAL_TABLET | Freq: Every day | ORAL | Status: DC

## 2015-07-29 MED ORDER — LISINOPRIL 10 MG PO TABS: 10.0000 mg | ORAL_TABLET | Freq: Every day | ORAL | Status: DC

## 2015-07-29 MED FILL — METOPROLOL SUCC ER 200 MG T: 200 | 30 days supply | Qty: 30 | Fill #0

## 2015-07-29 MED FILL — PROMETHAZINE 25 MG TABLET: 25 | 10 days supply | Qty: 30 | Fill #0

## 2015-07-29 MED FILL — HYDROCODON-APAP 5-325: 5-325 | 3 days supply | Qty: 30 | Fill #0

## 2015-07-29 MED FILL — BUTALBITAL COMPOUND CAPSULE: 50-325-40 | 15 days supply | Qty: 30 | Fill #0

## 2015-07-29 MED FILL — LISINOPRIL 10 MG TABLET: 10 | 30 days supply | Qty: 30 | Fill #0

## 2015-07-29 MED FILL — AMITRIPTYLINE HCL 100 MG TA: 100 | 30 days supply | Qty: 30 | Fill #0

## 2015-07-29 NOTE — Patient Instructions (Signed)
Call insurance to see if they pay for Zostavax/Shingles shot in your 89s.    Preventive Care for Adults, Female A healthy lifestyle and preventive care can promote health and wellness. Preventive health guidelines for women include the following key practices.  A routine yearly physical is a good way to check with your health care provider about your health and preventive screening. It is a chance to share any concerns and updates on your health and to receive a thorough exam.  Visit your dentist for a routine exam and preventive care every 6 months. Brush your teeth twice a day and floss once a day. Good oral hygiene prevents tooth decay and gum disease.  The frequency of eye exams is based on your age, health, family medical history, use of contact lenses, and other factors. Follow your health care provider's recommendations for frequency of eye exams.  Eat a healthy diet. Foods like vegetables, fruits, whole grains, low-fat dairy products, and lean protein foods contain the nutrients you need without too many calories. Decrease your intake of foods high in solid fats, added sugars, and salt. Eat the right amount of calories for you.Get information about a proper diet from your health care provider, if necessary.  Regular physical exercise is one of the most important things you can do for your health. Most adults should get at least 150 minutes of moderate-intensity exercise (any activity that increases your heart rate and causes you to sweat) each week. In addition, most adults need muscle-strengthening exercises on 2 or more days a week.  Maintain a healthy weight. The body mass index (BMI) is a screening tool to identify possible weight problems. It provides an estimate of body fat based on height and weight. Your health care provider can find your BMI and can help you achieve or maintain a healthy weight.For adults 20 years and older:  A BMI below 18.5 is considered underweight.  A BMI  of 18.5 to 24.9 is normal.  A BMI of 25 to 29.9 is considered overweight.  A BMI of 30 and above is considered obese.  Maintain normal blood lipids and cholesterol levels by exercising and minimizing your intake of saturated fat. Eat a balanced diet with plenty of fruit and vegetables. Blood tests for lipids and cholesterol should begin at age 29 and be repeated every 5 years. If your lipid or cholesterol levels are high, you are over 50, or you are at high risk for heart disease, you may need your cholesterol levels checked more frequently.Ongoing high lipid and cholesterol levels should be treated with medicines if diet and exercise are not working.  If you smoke, find out from your health care provider how to quit. If you do not use tobacco, do not start.  Lung cancer screening is recommended for adults aged 78-80 years who are at high risk for developing lung cancer because of a history of smoking. A yearly low-dose CT scan of the lungs is recommended for people who have at least a 30-pack-year history of smoking and are a current smoker or have quit within the past 15 years. A pack year of smoking is smoking an average of 1 pack of cigarettes a day for 1 year (for example: 1 pack a day for 30 years or 2 packs a day for 15 years). Yearly screening should continue until the smoker has stopped smoking for at least 15 years. Yearly screening should be stopped for people who develop a health problem that would prevent them from  having lung cancer treatment.  If you are pregnant, do not drink alcohol. If you are breastfeeding, be very cautious about drinking alcohol. If you are not pregnant and choose to drink alcohol, do not have more than 1 drink per day. One drink is considered to be 12 ounces (355 mL) of beer, 5 ounces (148 mL) of wine, or 1.5 ounces (44 mL) of liquor.  Avoid use of street drugs. Do not share needles with anyone. Ask for help if you need support or instructions about stopping the  use of drugs.  High blood pressure causes heart disease and increases the risk of stroke. Your blood pressure should be checked at least every 1 to 2 years. Ongoing high blood pressure should be treated with medicines if weight loss and exercise do not work.  If you are 57-27 years old, ask your health care provider if you should take aspirin to prevent strokes.  Diabetes screening is done by taking a blood sample to check your blood glucose level after you have not eaten for a certain period of time (fasting). If you are not overweight and you do not have risk factors for diabetes, you should be screened once every 3 years starting at age 31. If you are overweight or obese and you are 64-74 years of age, you should be screened for diabetes every year as part of your cardiovascular risk assessment.  Breast cancer screening is essential preventive care for women. You should practice "breast self-awareness." This means understanding the normal appearance and feel of your breasts and may include breast self-examination. Any changes detected, no matter how small, should be reported to a health care provider. Women in their 2s and 30s should have a clinical breast exam (CBE) by a health care provider as part of a regular health exam every 1 to 3 years. After age 84, women should have a CBE every year. Starting at age 53, women should consider having a mammogram (breast X-ray test) every year. Women who have a family history of breast cancer should talk to their health care provider about genetic screening. Women at a high risk of breast cancer should talk to their health care providers about having an MRI and a mammogram every year.  Breast cancer gene (BRCA)-related cancer risk assessment is recommended for women who have family members with BRCA-related cancers. BRCA-related cancers include breast, ovarian, tubal, and peritoneal cancers. Having family members with these cancers may be associated with an  increased risk for harmful changes (mutations) in the breast cancer genes BRCA1 and BRCA2. Results of the assessment will determine the need for genetic counseling and BRCA1 and BRCA2 testing.  Your health care provider may recommend that you be screened regularly for cancer of the pelvic organs (ovaries, uterus, and vagina). This screening involves a pelvic examination, including checking for microscopic changes to the surface of your cervix (Pap test). You may be encouraged to have this screening done every 3 years, beginning at age 19.  For women ages 85-65, health care providers may recommend pelvic exams and Pap testing every 3 years, or they may recommend the Pap and pelvic exam, combined with testing for human papilloma virus (HPV), every 5 years. Some types of HPV increase your risk of cervical cancer. Testing for HPV may also be done on women of any age with unclear Pap test results.  Other health care providers may not recommend any screening for nonpregnant women who are considered low risk for pelvic cancer and who do  not have symptoms. Ask your health care provider if a screening pelvic exam is right for you.  If you have had past treatment for cervical cancer or a condition that could lead to cancer, you need Pap tests and screening for cancer for at least 20 years after your treatment. If Pap tests have been discontinued, your risk factors (such as having a new sexual partner) need to be reassessed to determine if screening should resume. Some women have medical problems that increase the chance of getting cervical cancer. In these cases, your health care provider may recommend more frequent screening and Pap tests.  Colorectal cancer can be detected and often prevented. Most routine colorectal cancer screening begins at the age of 24 years and continues through age 45 years. However, your health care provider may recommend screening at an earlier age if you have risk factors for colon  cancer. On a yearly basis, your health care provider may provide home test kits to check for hidden blood in the stool. Use of a small camera at the end of a tube, to directly examine the colon (sigmoidoscopy or colonoscopy), can detect the earliest forms of colorectal cancer. Talk to your health care provider about this at age 46, when routine screening begins. Direct exam of the colon should be repeated every 5-10 years through age 63 years, unless early forms of precancerous polyps or small growths are found.  People who are at an increased risk for hepatitis B should be screened for this virus. You are considered at high risk for hepatitis B if:  You were born in a country where hepatitis B occurs often. Talk with your health care provider about which countries are considered high risk.  Your parents were born in a high-risk country and you have not received a shot to protect against hepatitis B (hepatitis B vaccine).  You have HIV or AIDS.  You use needles to inject street drugs.  You live with, or have sex with, someone who has hepatitis B.  You get hemodialysis treatment.  You take certain medicines for conditions like cancer, organ transplantation, and autoimmune conditions.  Hepatitis C blood testing is recommended for all people born from [birth year] through 1965 and any individual with known risks for hepatitis C.  Practice safe sex. Use condoms and avoid high-risk sexual practices to reduce the spread of sexually transmitted infections (STIs). STIs include gonorrhea, chlamydia, syphilis, trichomonas, herpes, HPV, and human immunodeficiency virus (HIV). Herpes, HIV, and HPV are viral illnesses that have no cure. They can result in disability, cancer, and death.  You should be screened for sexually transmitted illnesses (STIs) including gonorrhea and chlamydia if:  You are sexually active and are younger than 24 years.  You are older than 24 years and your health care provider tells you  that you are at risk for this type of infection.  Your sexual activity has changed since you were last screened and you are at an increased risk for chlamydia or gonorrhea. Ask your health care provider if you are at risk.  If you are at risk of being infected with HIV, it is recommended that you take a prescription medicine daily to prevent HIV infection. This is called preexposure prophylaxis (PrEP). You are considered at risk if:  You are sexually active and do not regularly use condoms or know the HIV status of your partner(s).  You take drugs by injection.  You are sexually active with a partner who has HIV.  Talk with your  health care provider about whether you are at high risk of being infected with HIV. If you choose to begin PrEP, you should first be tested for HIV. You should then be tested every 3 months for as long as you are taking PrEP.  Osteoporosis is a disease in which the bones lose minerals and strength with aging. This can result in serious bone fractures or breaks. The risk of osteoporosis can be identified using a bone density scan. Women ages 68 years and over and women at risk for fractures or osteoporosis should discuss screening with their health care providers. Ask your health care provider whether you should take a calcium supplement or vitamin D to reduce the rate of osteoporosis.  Menopause can be associated with physical symptoms and risks. Hormone replacement therapy is available to decrease symptoms and risks. You should talk to your health care provider about whether hormone replacement therapy is right for you.  Use sunscreen. Apply sunscreen liberally and repeatedly throughout the day. You should seek shade when your shadow is shorter than you. Protect yourself by wearing long sleeves, pants, a wide-brimmed hat, and sunglasses year round, whenever you are outdoors.  Once a month, do a whole body skin exam, using a mirror to look at the skin on your back. Tell  your health care provider of new moles, moles that have irregular borders, moles that are larger than a pencil eraser, or moles that have changed in shape or color.  Stay current with required vaccines (immunizations).  Influenza vaccine. All adults should be immunized every year.  Tetanus, diphtheria, and acellular pertussis (Td, Tdap) vaccine. Pregnant women should receive 1 dose of Tdap vaccine during each pregnancy. The dose should be obtained regardless of the length of time since the last dose. Immunization is preferred during the 27th-36th week of gestation. An adult who has not previously received Tdap or who does not know her vaccine status should receive 1 dose of Tdap. This initial dose should be followed by tetanus and diphtheria toxoids (Td) booster doses every 10 years. Adults with an unknown or incomplete history of completing a 3-dose immunization series with Td-containing vaccines should begin or complete a primary immunization series including a Tdap dose. Adults should receive a Td booster every 10 years.  Varicella vaccine. An adult without evidence of immunity to varicella should receive 2 doses or a second dose if she has previously received 1 dose. Pregnant females who do not have evidence of immunity should receive the first dose after pregnancy. This first dose should be obtained before leaving the health care facility. The second dose should be obtained 4-8 weeks after the first dose.  Human papillomavirus (HPV) vaccine. Females aged 13-26 years who have not received the vaccine previously should obtain the 3-dose series. The vaccine is not recommended for use in pregnant females. However, pregnancy testing is not needed before receiving a dose. If a female is found to be pregnant after receiving a dose, no treatment is needed. In that case, the remaining doses should be delayed until after the pregnancy. Immunization is recommended for any person with an immunocompromised  condition through the age of 21 years if she did not get any or all doses earlier. During the 3-dose series, the second dose should be obtained 4-8 weeks after the first dose. The third dose should be obtained 24 weeks after the first dose and 16 weeks after the second dose.  Zoster vaccine. One dose is recommended for adults aged 78 years  or older unless certain conditions are present.  Measles, mumps, and rubella (MMR) vaccine. Adults born before [birth year] generally are considered immune to measles and mumps. Adults born in [birth year] or later should have 1 or more doses of MMR vaccine unless there is a contraindication to the vaccine or there is laboratory evidence of immunity to each of the three diseases. A routine second dose of MMR vaccine should be obtained at least 28 days after the first dose for students attending postsecondary schools, health care workers, or international travelers. People who received inactivated measles vaccine or an unknown type of measles vaccine during 1963-1967 should receive 2 doses of MMR vaccine. People who received inactivated mumps vaccine or an unknown type of mumps vaccine before 1979 and are at high risk for mumps infection should consider immunization with 2 doses of MMR vaccine. For females of childbearing age, rubella immunity should be determined. If there is no evidence of immunity, females who are not pregnant should be vaccinated. If there is no evidence of immunity, females who are pregnant should delay immunization until after pregnancy. Unvaccinated health care workers born before [birth year] who lack laboratory evidence of measles, mumps, or rubella immunity or laboratory confirmation of disease should consider measles and mumps immunization with 2 doses of MMR vaccine or rubella immunization with 1 dose of MMR vaccine.  Pneumococcal 13-valent conjugate (PCV13) vaccine. When indicated, a person who is uncertain of his immunization history and has no record of immunization  should receive the PCV13 vaccine. All adults 79 years of age and older should receive this vaccine. An adult aged 88 years or older who has certain medical conditions and has not been previously immunized should receive 1 dose of PCV13 vaccine. This PCV13 should be followed with a dose of pneumococcal polysaccharide (PPSV23) vaccine. Adults who are at high risk for pneumococcal disease should obtain the PPSV23 vaccine at least 8 weeks after the dose of PCV13 vaccine. Adults older than 53 years of age who have normal immune system function should obtain the PPSV23 vaccine dose at least 1 year after the dose of PCV13 vaccine.  Pneumococcal polysaccharide (PPSV23) vaccine. When PCV13 is also indicated, PCV13 should be obtained first. All adults aged 71 years and older should be immunized. An adult younger than age 64 years who has certain medical conditions should be immunized. Any person who resides in a nursing home or long-term care facility should be immunized. An adult smoker should be immunized. People with an immunocompromised condition and certain other conditions should receive both PCV13 and PPSV23 vaccines. People with human immunodeficiency virus (HIV) infection should be immunized as soon as possible after diagnosis. Immunization during chemotherapy or radiation therapy should be avoided. Routine use of PPSV23 vaccine is not recommended for American Indians, [place], or people younger than 65 years unless there are medical conditions that require PPSV23 vaccine. When indicated, people who have unknown immunization and have no record of immunization should receive PPSV23 vaccine. One-time revaccination 5 years after the first dose of PPSV23 is recommended for people aged 19-64 years who have chronic kidney failure, nephrotic syndrome, asplenia, or immunocompromised conditions. People who received 1-2 doses of PPSV23 before age 19 years should receive another dose of PPSV23 vaccine at age 44 years  or later if at least 5 years have passed since the previous dose. Doses of PPSV23 are not needed for people immunized with PPSV23 at or after age 33 years.  Meningococcal vaccine. Adults with asplenia or persistent complement component  deficiencies should receive 2 doses of quadrivalent meningococcal conjugate (MenACWY-D) vaccine. The doses should be obtained at least 2 months apart. Microbiologists working with certain meningococcal bacteria, [place] recruits, people at risk during an outbreak, and people who travel to or live in countries with a high rate of meningitis should be immunized. A first-year college student up through age 63 years who is living in a residence hall should receive a dose if she did not receive a dose on or after her 16th birthday. Adults who have certain high-risk conditions should receive one or more doses of vaccine.  Hepatitis A vaccine. Adults who wish to be protected from this disease, have certain high-risk conditions, work with hepatitis A-infected animals, work in hepatitis A research labs, or travel to or work in countries with a high rate of hepatitis A should be immunized. Adults who were previously unvaccinated and who anticipate close contact with an international adoptee during the first 60 days after arrival in the [place] from a country with a high rate of hepatitis A should be immunized.  Hepatitis B vaccine. Adults who wish to be protected from this disease, have certain high-risk conditions, may be exposed to blood or other infectious body fluids, are household contacts or sex partners of hepatitis B positive people, are clients or workers in certain care facilities, or travel to or work in countries with a high rate of hepatitis B should be immunized.  Haemophilus influenzae type b (Hib) vaccine. A previously unvaccinated person with asplenia or sickle cell disease or having a scheduled splenectomy should receive 1 dose of Hib vaccine. Regardless of  previous immunization, a recipient of a hematopoietic stem cell transplant should receive a 3-dose series 6-12 months after her successful transplant. Hib vaccine is not recommended for adults with HIV infection. Preventive Services / Frequency Ages 53 to 87 years  Blood pressure check.** / Every 3-5 years.  Lipid and cholesterol check.** / Every 5 years beginning at age 58.  Clinical breast exam.** / Every 3 years for women in their 54s and 33s.  BRCA-related cancer risk assessment.** / For women who have family members with a BRCA-related cancer (breast, ovarian, tubal, or peritoneal cancers).  Pap test.** / Every 2 years from ages 68 through 43. Every 3 years starting at age 75 through age 55 or 44 with a history of 3 consecutive normal Pap tests.  HPV screening.** / Every 3 years from ages 28 through ages 54 to 22 with a history of 3 consecutive normal Pap tests.  Hepatitis C blood test.** / For any individual with known risks for hepatitis C.  Skin self-exam. / Monthly.  Influenza vaccine. / Every year.  Tetanus, diphtheria, and acellular pertussis (Tdap, Td) vaccine.** / Consult your health care provider. Pregnant women should receive 1 dose of Tdap vaccine during each pregnancy. 1 dose of Td every 10 years.  Varicella vaccine.** / Consult your health care provider. Pregnant females who do not have evidence of immunity should receive the first dose after pregnancy.  HPV vaccine. / 3 doses over 6 months, if 83 and younger. The vaccine is not recommended for use in pregnant females. However, pregnancy testing is not needed before receiving a dose.  Measles, mumps, rubella (MMR) vaccine.** / You need at least 1 dose of MMR if you were born in 1957 or later. You may also need a 2nd dose. For females of childbearing age, rubella immunity should be determined. If there is no evidence of immunity, females who  are not pregnant should be vaccinated. If there is no evidence of immunity,  females who are pregnant should delay immunization until after pregnancy.  Pneumococcal 13-valent conjugate (PCV13) vaccine.** / Consult your health care provider.  Pneumococcal polysaccharide (PPSV23) vaccine.** / 1 to 2 doses if you smoke cigarettes or if you have certain conditions.  Meningococcal vaccine.** / 1 dose if you are age 39 to 21 years and a Market researcher living in a residence hall, or have one of several medical conditions, you need to get vaccinated against meningococcal disease. You may also need additional booster doses.  Hepatitis A vaccine.** / Consult your health care provider.  Hepatitis B vaccine.** / Consult your health care provider.  Haemophilus influenzae type b (Hib) vaccine.** / Consult your health care provider. Ages 109 to 53 years  Blood pressure check.** / Every year.  Lipid and cholesterol check.** / Every 5 years beginning at age 30 years.  Lung cancer screening. / Every year if you are aged 49-80 years and have a 30-pack-year history of smoking and currently smoke or have quit within the past 15 years. Yearly screening is stopped once you have quit smoking for at least 15 years or develop a health problem that would prevent you from having lung cancer treatment.  Clinical breast exam.** / Every year after age 66 years.  BRCA-related cancer risk assessment.** / For women who have family members with a BRCA-related cancer (breast, ovarian, tubal, or peritoneal cancers).  Mammogram.** / Every year beginning at age 56 years and continuing for as long as you are in good health. Consult with your health care provider.  Pap test.** / Every 3 years starting at age 18 years through age 78 or 10 years with a history of 3 consecutive normal Pap tests.  HPV screening.** / Every 3 years from ages 24 years through ages 43 to 21 years with a history of 3 consecutive normal Pap tests.  Fecal occult blood test (FOBT) of stool. / Every year beginning at  age 96 years and continuing until age 34 years. You may not need to do this test if you get a colonoscopy every 10 years.  Flexible sigmoidoscopy or colonoscopy.** / Every 5 years for a flexible sigmoidoscopy or every 10 years for a colonoscopy beginning at age 54 years and continuing until age 40 years.  Hepatitis C blood test.** / For all people born from [birth year] through 1963 and any individual with known risks for hepatitis C.  Skin self-exam. / Monthly.  Influenza vaccine. / Every year.  Tetanus, diphtheria, and acellular pertussis (Tdap/Td) vaccine.** / Consult your health care provider. Pregnant women should receive 1 dose of Tdap vaccine during each pregnancy. 1 dose of Td every 10 years.  Varicella vaccine.** / Consult your health care provider. Pregnant females who do not have evidence of immunity should receive the first dose after pregnancy.  Zoster vaccine.** / 1 dose for adults aged 98 years or older.  Measles, mumps, rubella (MMR) vaccine.** / You need at least 1 dose of MMR if you were born in 1957 or later. You may also need a second dose. For females of childbearing age, rubella immunity should be determined. If there is no evidence of immunity, females who are not pregnant should be vaccinated. If there is no evidence of immunity, females who are pregnant should delay immunization until after pregnancy.  Pneumococcal 13-valent conjugate (PCV13) vaccine.** / Consult your health care provider.  Pneumococcal polysaccharide (PPSV23) vaccine.** / 1 to  2 doses if you smoke cigarettes or if you have certain conditions.  Meningococcal vaccine.** / Consult your health care provider.  Hepatitis A vaccine.** / Consult your health care provider.  Hepatitis B vaccine.** / Consult your health care provider.  Haemophilus influenzae type b (Hib) vaccine.** / Consult your health care provider. Ages 19 years and over  Blood pressure check.** / Every year.  Lipid and cholesterol check.**  / Every 5 years beginning at age 74 years.  Lung cancer screening. / Every year if you are aged 73-80 years and have a 30-pack-year history of smoking and currently smoke or have quit within the past 15 years. Yearly screening is stopped once you have quit smoking for at least 15 years or develop a health problem that would prevent you from having lung cancer treatment.  Clinical breast exam.** / Every year after age 36 years.  BRCA-related cancer risk assessment.** / For women who have family members with a BRCA-related cancer (breast, ovarian, tubal, or peritoneal cancers).  Mammogram.** / Every year beginning at age 33 years and continuing for as long as you are in good health. Consult with your health care provider.  Pap test.** / Every 3 years starting at age 59 years through age 68 or 58 years with 3 consecutive normal Pap tests. Testing can be stopped between 65 and 70 years with 3 consecutive normal Pap tests and no abnormal Pap or HPV tests in the past 10 years.  HPV screening.** / Every 3 years from ages 41 years through ages 42 or 62 years with a history of 3 consecutive normal Pap tests. Testing can be stopped between 65 and 70 years with 3 consecutive normal Pap tests and no abnormal Pap or HPV tests in the past 10 years.  Fecal occult blood test (FOBT) of stool. / Every year beginning at age 73 years and continuing until age 11 years. You may not need to do this test if you get a colonoscopy every 10 years.  Flexible sigmoidoscopy or colonoscopy.** / Every 5 years for a flexible sigmoidoscopy or every 10 years for a colonoscopy beginning at age 41 years and continuing until age 60 years.  Hepatitis C blood test.** / For all people born from [birth year] through 1963 and any individual with known risks for hepatitis C.  Osteoporosis screening.** / A one-time screening for women ages 44 years and over and women at risk for fractures or osteoporosis.  Skin self-exam. / Monthly.  Influenza  vaccine. / Every year.  Tetanus, diphtheria, and acellular pertussis (Tdap/Td) vaccine.** / 1 dose of Td every 10 years.  Varicella vaccine.** / Consult your health care provider.  Zoster vaccine.** / 1 dose for adults aged 43 years or older.  Pneumococcal 13-valent conjugate (PCV13) vaccine.** / Consult your health care provider.  Pneumococcal polysaccharide (PPSV23) vaccine.** / 1 dose for all adults aged 63 years and older.  Meningococcal vaccine.** / Consult your health care provider.  Hepatitis A vaccine.** / Consult your health care provider.  Hepatitis B vaccine.** / Consult your health care provider.  Haemophilus influenzae type b (Hib) vaccine.** / Consult your health care provider. ** Family history and personal history of risk and conditions may change your health care provider's recommendations.   This information is not intended to replace advice given to you by your health care provider. Make sure you discuss any questions you have with your health care provider.   Document Released: 07/27/2001 Document Revised: 06/21/2014 Document Reviewed: 10/26/2010 Elsevier Interactive Patient  Education 2016 [employer].

## 2015-07-29 NOTE — Progress Notes (Signed)
Pre visit review using our clinic review tool, if applicable. No additional management support is needed unless otherwise documented below in the visit note. 

## 2015-07-30 ENCOUNTER — Telehealth: Payer: Self-pay | Admitting: Family Medicine

## 2015-07-30 NOTE — Telephone Encounter (Signed)
Reviewing for Dr. Abner Greenspan who is out today.Please call to assess further. The note is not finished -- If she was started on the medication yesterday and has never taken Amitriptyline before I would recommend she take 1/2 tablet for a few days before going up to a full tablet to let her body adjust to the medication. If symptoms still persist when taking half a tablet she is to stop medication and call the office.

## 2015-07-30 NOTE — Telephone Encounter (Signed)
Spoke with Pt, she can not remember if she has previously taken Elavil in any dosage. Informed her that Dr. Abner Greenspan is not here today but Selena Batten, [place] reviewed chart and OV notes from 2/14 which were not completed yet. Several new medications prescribed yesterday at visit, questioned Pt if she has taken Hydrocodone yet, which she states she didn not take one yesterday but has taken one this morning, also questioned if she has previously been able to tolerate Codeine, which she stated she has not had any previous problem with. Informed her Selena Batten recommended Pt to try 1/2 tablet of Elavil tonight and if she can tolerate to take only 1/2 tablet for several days and then increase to 1 tablet. Instructed Pt however if symptoms return w/ the 1/2 tablet do not continue taking and call office. Pt agreed to plan and wanted to thank [place] for calling.

## 2015-07-30 NOTE — Telephone Encounter (Signed)
Pt says that she was seen by Dr. B and was prescribed a medication amitriptyline. She says that it is causing nausea and headaches. Pt isn't sure if she should continue medication.   Please advise.    CB: [phone].

## 2015-07-30 NOTE — Telephone Encounter (Signed)
Please advise 

## 2015-07-31 ENCOUNTER — Ambulatory Visit (INDEPENDENT_AMBULATORY_CARE_PROVIDER_SITE_OTHER): Payer: 59 | Admitting: Medical

## 2015-07-31 ENCOUNTER — Telehealth: Payer: Self-pay | Admitting: Medical

## 2015-07-31 ENCOUNTER — Encounter: Payer: Self-pay | Admitting: Medical

## 2015-07-31 VITALS — BP 122/84 | HR 88 | Temp 97.8°F | Ht 67.0 in | Wt 154.0 lb

## 2015-07-31 DIAGNOSIS — K529 Noninfective gastroenteritis and colitis, unspecified: Secondary | ICD-10-CM | POA: Diagnosis not present

## 2015-07-31 DIAGNOSIS — I10 Essential (primary) hypertension: Secondary | ICD-10-CM

## 2015-07-31 DIAGNOSIS — R51 Headache: Secondary | ICD-10-CM | POA: Diagnosis not present

## 2015-07-31 DIAGNOSIS — R519 Headache, unspecified: Secondary | ICD-10-CM

## 2015-07-31 MED ORDER — PROMETHAZINE HCL 25 MG/ML IJ SOLN
25.0000 mg | Freq: Once | INTRAMUSCULAR | Status: AC
Start: 2015-07-31 — End: 2015-07-31
  Administered 2015-07-31: 25 mg via INTRAMUSCULAR

## 2015-07-31 MED ORDER — KETOROLAC TROMETHAMINE 60 MG/2ML IM SOLN
60.0000 mg | Freq: Once | INTRAMUSCULAR | Status: AC
  Administered 2015-07-31: 60 mg via INTRAMUSCULAR

## 2015-07-31 MED ORDER — ONDANSETRON 4 MG PO TBDP: 4.0000 mg | ORAL_TABLET | Freq: Three times a day (TID) | ORAL | Status: DC | PRN

## 2015-07-31 NOTE — Progress Notes (Signed)
Subjective:    Patient ID: Carla West, female    DOB: Mar 23, 1961, 55 y.o.   MRN: [id number]  HPI  Pt in with some pain. Left side of her neck. Pt has history of migraine ha. She feels like it is starting. Pt has hx of migraines. She states almost got toradol this week when in earlier. But pt declined then. But now willing to take toradol.    Pt started with vomiting and some diarrhea since wed am. Today all afternoon 6-7 times. Mild abdominal discomfort.  Pt in today reporting  diarrhea for at least 2days. On review no  association with with any foods or meal that immediately preceded onset of diarrhea. Reports no contact with  persons with GI illness. Recent no antibiotics. Reports no history of an inflammatory bowel diseases. Reports approximately number of stools a day 6-7. Some  nausea and vomiting. Stomach cramps. Pt has tried otc treatments.  Pt started amitriptylene, butalbital and phenergan on Tuesday night. Next morning felt sick. Nausea all day. Pt states she has been all of these before. Elavil was a lower dose in the past.    Review of Systems  Constitutional: Negative for fever, chills and fatigue.  Respiratory: Negative for cough and shortness of breath.   Cardiovascular: Negative for chest pain and palpitations.  Gastrointestinal: Positive for nausea, abdominal pain and diarrhea. Negative for vomiting, constipation, blood in stool, abdominal distention and rectal pain.       Faint mild abd pain.  Musculoskeletal:       Lt trapezius tender.  Skin: Negative for rash.  Neurological: Positive for headaches. Negative for dizziness, syncope, weakness, light-headedness and numbness.  Hematological: Negative for adenopathy. Does not bruise/bleed easily.  Psychiatric/Behavioral: Negative for behavioral problems and confusion.   Past Medical History  Diagnosis Date  . Migraines   . Hypertension   . History of chicken pox     Social History   Social History  . Marital  Status: Divorced    Spouse Name: N/A  . Number of Children: N/A  . Years of Education: N/A   Occupational History  . Not on file.   Social History Main Topics  . Smoking status: Never Smoker   . Smokeless tobacco: Not on file  . Alcohol Use: No  . Drug Use: No  . 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 Not on file   Social History Narrative    Past Surgical History  Procedure Laterality Date  . Tubal ligation      Family History  Problem Relation Age of Onset  . Hypertension Mother   . Heart disease Mother   . Hyperlipidemia Father   . Hyperlipidemia Sister   . Other Brother     plan crash in [employer]  . Migraines Son   . Heart disease Brother     CHF, arrythmia  . Hypertension Brother   . Gout Brother   . Hypertension Brother     No Known Allergies  Current Outpatient Prescriptions on File Prior to Visit  Medication Sig Dispense Refill  . amitriptyline (ELAVIL) 100 MG tablet Take 1 tablet (100 mg total) by mouth at bedtime. 30 tablet 3  . butalbital-aspirin-caffeine (FIORINAL) 50-325-40 MG capsule Take 1 capsule by mouth 2 (two) times daily as needed for headache. 30 capsule 1  . HYDROcodone-acetaminophen (NORCO/VICODIN) 5-325 MG tablet Take 1-2 tablets by mouth  every 4 (four) hours as needed for moderate pain or severe pain. 30 tablet 0  . lisinopril (PRINIVIL,ZESTRIL) 10 MG tablet Take 1 tablet (10 mg total) by mouth daily. 30 tablet 3  . metoprolol (TOPROL XL) 200 MG 24 hr tablet Take 1 tablet (200 mg total) by mouth daily. 30 tablet 3  . promethazine (PHENERGAN) 25 MG tablet Take 1 tablet (25 mg total) by mouth every 8 (eight) hours as needed for nausea or vomiting. 30 tablet 1   No current facility-administered medications on file prior to visit.    BP 122/84 mmHg  Pulse 88  Temp(Src) 97.8 F (36.6 C) (Oral)  Ht  (1.702 m)  Wt 154 lb (69.854 kg)   BMI 24.11 kg/m2  SpO2 97%       Objective:   Physical Exam  General Mental Status- Alert. General Appearance- Not in acute distress.   Skin General: Color- Normal Color. Moisture- Normal Moisture.  Neck Carotid Arteries-  No carotid bruits. No JVD. Lt trap tender to palpation  Chest and Lung Exam Auscultation: Breath Sounds:-Normal.  Cardiovascular Auscultation:Rythm- Regular. Murmurs & Other Heart Sounds:Auscultation of the heart reveals- No Murmurs.  Abdomen Inspection:-Inspeection Normal. Palpation/Percussion:Note:No mass. Palpation and Percussion of the abdomen reveal- only faint  Tender, Non Distended + BS, no rebound or guarding.  Back- no cva tenderness  Neurologic Cranial Nerve exam:- CN III-XII intact(No nystagmus), symmetric smile. Strength:- 5/5 equal and symmetric strength both upper and lower extremities.      Assessment & Plan:  For migraine ha with possible tension component tordaol 60 mg im and phenergan 25 mg IM. Brother will drive her home. If ha worsens/severe with changing/worsening symptoms then ED evaluation.  You may have early viral gastroenteritis. Rest hydrate, bland diet and immodium otc. If diarrhea perists by Monday  then collect stool panel and turn in.  For time being would stop butalbital, elavil and phenergan tabs. Will see how Dr. Abner Greenspan may want to restart.  Will rx zofran if you need.  Continue the toprol and lisinopril. Your bp is well controlled now.  Follow up 7 days or as needed

## 2015-07-31 NOTE — Telephone Encounter (Signed)
Dr. Abner Greenspan pt was seen yesterday. She had likely migraine ha with some recent viral gastroenteritis type symptoms. She had nausea on wed morning. She attributed this to elavil, butalbital and phenergan which she started I believe on Tuesday. She attributes these new med to nausea. For the time being I advised to hold those. Gave toradol and phenergan IM at her request.  Prescribed zofran for following day if nausea returns. If you would review my note. I mentioned would run by you whether or not you want her to restart elavil and butalbital. I thought maybe wait till GI symptoms resolve then maybe start on 50 mg elavil and resume same dose butalbital. Pt seems to think high dose of elavil caused her symptoms. Of course nausea likely migraine or gastroenteritis related.

## 2015-07-31 NOTE — Progress Notes (Signed)
Pre visit review using our clinic review tool, if applicable. No additional management support is needed unless otherwise documented below in the visit note. 

## 2015-07-31 NOTE — Patient Instructions (Addendum)
For migraine ha with possible tension component tordaol 60 mg im and phenergan 25 mg IM. Brother will drive her home. If ha worsens/severe with changing/worsening symptoms then ED evaluation.  You may have early viral gastroenteritis. Rest hydrate, bland diet and immodium otc. If diarrhea perists by Monday  then collect stool panel and turn in.  For time being would stop butalbital, elavil and phenergan tabs. Will see how Dr. Abner Greenspan may want to restart.  Will rx zofran if you need.  Continue the toprol and lisinopril. Your bp is well controlled now.  Follow up 7 days or as needed

## 2015-08-01 NOTE — Telephone Encounter (Signed)
Spoke to the patient this morning.  She is doing much better.  Did not take a Elavil last night or today.  Yes, she had taken this before but she thinks only elavil 10 mg.  But she will try again tonight to take only 1/2 of the 100 mg elavil you prescribed.  Her BP is good now.

## 2015-08-01 NOTE — Telephone Encounter (Signed)
Please check and see how patient is doing. It seems like she was not already taking the Elavil when she started earlier this week as I thought she was when she became our patient this week. She can either stop the Elavil altogether or we can call her in the 25 mg tab to take qhs if she would like. Clarify if she has taken it and tolerated it at lower doses in the past. r

## 2015-08-03 ENCOUNTER — Encounter: Payer: Self-pay | Admitting: Family Medicine

## 2015-08-03 DIAGNOSIS — I10 Essential (primary) hypertension: Secondary | ICD-10-CM | POA: Insufficient documentation

## 2015-08-03 DIAGNOSIS — R87619 Unspecified abnormal cytological findings in specimens from cervix uteri: Secondary | ICD-10-CM

## 2015-08-03 DIAGNOSIS — G43909 Migraine, unspecified, not intractable, without status migrainosus: Secondary | ICD-10-CM | POA: Insufficient documentation

## 2015-08-03 HISTORY — DX: Essential (primary) hypertension: I10

## 2015-08-03 HISTORY — DX: Unspecified abnormal cytological findings in specimens from cervix uteri: R87.619

## 2015-08-03 HISTORY — DX: Migraine, unspecified, not intractable, without status migrainosus: G43.909

## 2015-08-03 NOTE — Assessment & Plan Note (Signed)
Will request old records and monitor

## 2015-08-03 NOTE — Progress Notes (Signed)
Patient ID: Carla West, female   DOB: 07-02-1960, 55 y.o.   MRN: [id number]   Subjective:    Patient ID: Carla West, female    DOB: 1961/05/13, 55 y.o.   MRN: [id number]  Chief Complaint  Patient presents with  . Establish Care    HPI Patient is in today for new patient appointment. [place] Her PMH consists of significant migraine headaches with nausea and vomiting and a history of hypertension. Feels pretty good today but notes some left neck stiffness. No trauma or injury. Denies CP/palp/SOB/HA/congestion/fevers/GI or GU c/o. Taking meds as prescribed. Does note some increased stress with move  Past Medical History  Diagnosis Date  . Migraines   . Hypertension   . History of chicken pox   . Benign essential HTN 08/03/2015  . Migraine headache 08/03/2015  . Abnormal Pap smear of cervix 08/03/2015    Past Surgical History  Procedure Laterality Date  . Tubal ligation      Family History  Problem Relation Age of Onset  . Hypertension Mother   . Heart disease Mother   . Hyperlipidemia Father   . Hyperlipidemia Sister   . Other Brother     plan crash in [employer]  . Migraines Son   . Heart disease Brother     CHF, arrythmia  . Hypertension Brother   . Gout Brother   . Hypertension Brother     Social History   Social History  . Marital Status: Divorced    Spouse Name: N/A  . Number of Children: N/A  . Years of Education: N/A   Occupational History  . Not on file.   Social History Main Topics  . Smoking status: Never Smoker   . Smokeless tobacco: Not on file  . Alcohol Use: No  . Drug Use: No  . 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 Not on file   Social History Narrative    Outpatient Prescriptions Prior to Visit  Medication Sig Dispense Refill  . amitriptyline (ELAVIL) 100 MG tablet Take 1 tablet (100 mg total)  by mouth at bedtime. 30 tablet 1  . amLODipine (NORVASC) 5 MG tablet Take 5 mg by mouth daily.    . cloNIDine (CATAPRES) 0.1 MG tablet Take 0.1 mg by mouth daily as needed (for hypertension).    Marland Kitchen diclofenac (VOLTAREN) 50 MG EC tablet Take 50 mg by mouth 2 (two) times daily as needed for moderate pain.    . divalproex (DEPAKOTE) 250 MG DR tablet Take 250 mg by mouth 2 (two) times daily.    Marland Kitchen HYDROcodone-acetaminophen (NORCO/VICODIN) 5-325 MG tablet Take 1-2 tablets by mouth every 4 (four) hours as needed for moderate pain or severe pain. 20 tablet 0  . lisinopril (PRINIVIL,ZESTRIL) 10 MG tablet Take 1 tablet (10 mg total) by mouth daily. 30 tablet 1  . metoprolol (LOPRESSOR) 50 MG tablet Take 1 tablet (50 mg total) by mouth 2 (two) times daily. 60 tablet 1  . ondansetron (ZOFRAN ODT) 4 MG disintegrating tablet Take 1 tablet (4 mg total) by mouth every 4 (four) hours as needed for nausea or vomiting. 20 tablet 0   No facility-administered medications prior to visit.    No Known Allergies  Review of Systems  Constitutional: Negative for fever, chills and malaise/fatigue.  HENT: Negative for congestion and hearing loss.   Eyes:  Negative for discharge.  Respiratory: Negative for cough, sputum production and shortness of breath.   Cardiovascular: Negative for chest pain, palpitations and leg swelling.  Gastrointestinal: Negative for heartburn, nausea, vomiting, abdominal pain, diarrhea, constipation and blood in stool.  Genitourinary: Negative for dysuria, urgency, frequency and hematuria.  Musculoskeletal: Positive for neck pain. Negative for myalgias, back pain and falls.  Skin: Negative for rash.  Neurological: Negative for dizziness, sensory change, loss of consciousness, weakness and headaches.  Endo/Heme/Allergies: Negative for environmental allergies. Does not bruise/bleed easily.  Psychiatric/Behavioral: Negative for depression and suicidal ideas. The patient is not nervous/anxious and  does not have insomnia.        Objective:    Physical Exam  Constitutional: She is oriented to person, place, and time. She appears well-developed and well-nourished. No distress.  HENT:  Head: Normocephalic and atraumatic.  Eyes: Conjunctivae are normal.  Neck: Neck supple. No thyromegaly present.  Cardiovascular: Normal rate, regular rhythm and normal heart sounds.   No murmur heard. Pulmonary/Chest: Effort normal and breath sounds normal. No respiratory distress.  Abdominal: Soft. Bowel sounds are normal. She exhibits no distension and no mass. There is no tenderness.  Musculoskeletal: She exhibits no edema.  Lymphadenopathy:    She has no cervical adenopathy.  Neurological: She is alert and oriented to person, place, and time.  Skin: Skin is warm and dry.  Psychiatric: She has a normal mood and affect. Her behavior is normal.    BP 162/110 mmHg  Pulse 79  Temp(Src) 98 F (36.7 C) (Oral)  Ht  (1.702 m)  Wt 155 lb 6 oz (70.478 kg)  BMI 24.33 kg/m2  SpO2 96% Wt Readings from Last 3 Encounters:  07/31/15 154 lb (69.854 kg)  07/29/15 155 lb 6 oz (70.478 kg)  05/29/15 140 lb (63.504 kg)     Lab Results  Component Value Date   WBC 5.4 05/29/2015   HGB 12.4 05/29/2015   HCT 37.2 05/29/2015   PLT 234 05/29/2015   GLUCOSE 123* 05/29/2015   NA 139 05/29/2015   K 3.4* 05/29/2015   CL 105 05/29/2015   CREATININE 0.73 05/29/2015   BUN 14 05/29/2015   CO2 24 05/29/2015    No results found for: TSH Lab Results  Component Value Date   WBC 5.4 05/29/2015   HGB 12.4 05/29/2015   HCT 37.2 05/29/2015   MCV 85.9 05/29/2015   PLT 234 05/29/2015   Lab Results  Component Value Date   NA 139 05/29/2015   K 3.4* 05/29/2015   CO2 24 05/29/2015   GLUCOSE 123* 05/29/2015   BUN 14 05/29/2015   CREATININE 0.73 05/29/2015   CALCIUM 9.2 05/29/2015   ANIONGAP 10 05/29/2015   No results found for: CHOL No results found for: HDL No results found for: LDLCALC No  results found for: TRIG No results found for: CHOLHDL No results found for: ZOXW9U     Assessment & Plan:   Problem List Items Addressed This Visit    Abnormal Pap smear of cervix    Will request old records and monitor      Benign essential HTN - Primary    Poorly controlled will alter medications, encouraged DASH diet, minimize caffeine and obtain adequate sleep. Report concerning symptoms and follow up as directed and as needed. Will increase metoprolol and continue Lisinopril      Relevant Medications   metoprolol (TOPROL XL) 200 MG 24 hr tablet   lisinopril (PRINIVIL,ZESTRIL) 10 MG tablet   Migraine  headache    Long history, has used Elavil and Butalbital with some results. Is given refills and Encouraged increased hydration, 64 ounces of clear fluids daily. Minimize alcohol and caffeine. Eat small frequent meals with lean proteins and complex carbs. Avoid high and low blood sugars. Get adequate sleep, 7-8 hours a night. Needs exercise daily preferably in the morning. Has used Norco, Phenergan and Zofran prn, may continue      Relevant Medications   metoprolol (TOPROL XL) 200 MG 24 hr tablet   butalbital-aspirin-caffeine (FIORINAL) 50-325-40 MG capsule   lisinopril (PRINIVIL,ZESTRIL) 10 MG tablet   HYDROcodone-acetaminophen (NORCO/VICODIN) 5-325 MG tablet   amitriptyline (ELAVIL) 100 MG tablet      I have discontinued Ms. Matt's amLODipine, cloNIDine, divalproex, diclofenac, and metoprolol. I am also having her start on metoprolol, butalbital-aspirin-caffeine, and promethazine. Additionally, I am having her maintain her lisinopril, HYDROcodone-acetaminophen, and amitriptyline.  Meds ordered this encounter  Medications  . metoprolol (TOPROL XL) 200 MG 24 hr tablet    Sig: Take 1 tablet (200 mg total) by mouth daily.    Dispense:  30 tablet    Refill:  3  . butalbital-aspirin-caffeine (FIORINAL) 50-325-40 MG capsule    Sig: Take 1 capsule by mouth 2 (two) times daily  as needed for headache.    Dispense:  30 capsule    Refill:  1  . lisinopril (PRINIVIL,ZESTRIL) 10 MG tablet    Sig: Take 1 tablet (10 mg total) by mouth daily.    Dispense:  30 tablet    Refill:  3  . HYDROcodone-acetaminophen (NORCO/VICODIN) 5-325 MG tablet    Sig: Take 1-2 tablets by mouth every 4 (four) hours as needed for moderate pain or severe pain.    Dispense:  30 tablet    Refill:  0  . amitriptyline (ELAVIL) 100 MG tablet    Sig: Take 1 tablet (100 mg total) by mouth at bedtime.    Dispense:  30 tablet    Refill:  3  . promethazine (PHENERGAN) 25 MG tablet    Sig: Take 1 tablet (25 mg total) by mouth every 8 (eight) hours as needed for nausea or vomiting.    Dispense:  30 tablet    Refill:  1     Danise Edge, MD

## 2015-08-03 NOTE — Assessment & Plan Note (Addendum)
Long history, has used Elavil and Butalbital with some results. Is given refills and Encouraged increased hydration, 64 ounces of clear fluids daily. Minimize alcohol and caffeine. Eat small frequent meals with lean proteins and complex carbs. Avoid high and low blood sugars. Get adequate sleep, 7-8 hours a night. Needs exercise daily preferably in the morning. Has used Norco, Phenergan and Zofran prn, may continue

## 2015-08-03 NOTE — Assessment & Plan Note (Addendum)
Poorly controlled will alter medications, encouraged DASH diet, minimize caffeine and obtain adequate sleep. Report concerning symptoms and follow up as directed and as needed. Will increase metoprolol and continue Lisinopril

## 2015-08-03 NOTE — Telephone Encounter (Signed)
Does she want [place] to send in the 10 mg tabs, I am willing to send in the Elavil 10 or 25 mg tabs to restart it. r

## 2015-08-05 NOTE — Telephone Encounter (Signed)
Patient is ok to keep prescriptions as they are for now.  She is cutting them in half for now and doing ok. She is going to check BP when she gets home today and if it still is high she will call back to inform PCP.

## 2015-08-05 NOTE — Telephone Encounter (Signed)
Called left message to call back 

## 2015-10-09 MED FILL — LISINOPRIL 10 MG TABLET: 10 | 30 days supply | Qty: 30 | Fill #1

## 2015-10-09 MED FILL — PROMETHAZINE 25 MG TABLET: 25 | 10 days supply | Qty: 30 | Fill #1

## 2015-10-10 ENCOUNTER — Telehealth: Payer: Self-pay | Admitting: Family Medicine

## 2015-10-10 MED ORDER — HYDROCODONE-ACETAMINOPHEN 5-325 MG PO TABS: 1.0000 | ORAL_TABLET | ORAL | Status: DC | PRN

## 2015-10-10 MED ORDER — PROMETHAZINE HCL 25 MG RE SUPP: RECTAL | Status: DC

## 2015-10-10 MED FILL — AMITRIPTYLINE HCL 100 MG TA: 100 | 30 days supply | Qty: 30 | Fill #1

## 2015-10-10 MED FILL — PHENADOZ 25 MG SUPP: 25 | 15 days supply | Qty: 30 | Fill #0

## 2015-10-10 MED FILL — METOPROLOL SUCC ER 200 MG T: 200 | 30 days supply | Qty: 30 | Fill #1

## 2015-10-10 MED FILL — HYDROCODON-APAP 5-325: 5-325 | 3 days supply | Qty: 30 | Fill #0

## 2015-10-10 NOTE — Telephone Encounter (Signed)
Caller name: Velna HatchetSheila Relation to JW:JXBJpt:self Call back number: [phone] Pharmacy: [facility]  Reason for call: Pt is requesting rx for  promethazine (PHENERGAN) 25 MG suppository (since pt not able to tolerate pills in stomach when she has migraine, so pt is needing it as suppository) and is needing rx for HYDROcodone-acetaminophen (NORCO/VICODIN) 5-325 MG tablet. Pt states called yesterday for pharmacy to request prescription and is wanting to know if she can have it ASAP since her insurance is not active after Oct 13, 2015. Pt states is needing it today since pharmacy [facility] not open on the weekend need it today. Please advise.

## 2015-10-10 NOTE — Telephone Encounter (Signed)
Ok to d/c oral phenergan and start Phenergan 25 mg supp 1 pr bid prn n/v, disp #30 with 2 rf

## 2015-10-10 NOTE — Telephone Encounter (Signed)
Last refill for hydrocodone was on 07/29/2015  For #30 with 0 refills

## 2015-10-10 NOTE — Telephone Encounter (Signed)
Ok to refill??      Hydrocodone  

## 2015-10-10 NOTE — Telephone Encounter (Signed)
Sent in suppositories and patient informed hardcopy is ready for pickup at the front desk.

## 2016-01-14 ENCOUNTER — Emergency Department (HOSPITAL_COMMUNITY)
Admission: EM | Admit: 2016-01-14 | Discharge: 2016-01-14 | Disposition: A | Discharge status: Home / Self Care | Payer: Self-pay | Attending: Emergency Medicine | Admitting: Emergency Medicine

## 2016-01-14 ENCOUNTER — Emergency Department (HOSPITAL_COMMUNITY): Payer: Self-pay

## 2016-01-14 ENCOUNTER — Encounter (HOSPITAL_COMMUNITY): Payer: Self-pay | Admitting: Neurology

## 2016-01-14 DIAGNOSIS — R519 Headache, unspecified: Secondary | ICD-10-CM

## 2016-01-14 DIAGNOSIS — I160 Hypertensive urgency: Secondary | ICD-10-CM | POA: Insufficient documentation

## 2016-01-14 DIAGNOSIS — G43819 Other migraine, intractable, without status migrainosus: Secondary | ICD-10-CM | POA: Insufficient documentation

## 2016-01-14 DIAGNOSIS — I10 Essential (primary) hypertension: Secondary | ICD-10-CM

## 2016-01-14 DIAGNOSIS — R51 Headache: Secondary | ICD-10-CM

## 2016-01-14 LAB — BASIC METABOLIC PANEL
Anion gap: 10 (ref 5–15)
BUN: 13 mg/dL (ref 6–20)
CO2: 22 mmol/L (ref 22–32)
Calcium: 9.4 mg/dL (ref 8.9–10.3)
Chloride: 106 mmol/L (ref 101–111)
Creatinine, Ser: 0.89 mg/dL (ref 0.44–1.00)
GFR calc Af Amer: >60 mL/min (ref >60)
GFR calc non Af Amer: >60 mL/min (ref >60)
Glucose, Bld: 100 mg/dL — ABNORMAL HIGH (ref 65–99)
Potassium: 3.4 mmol/L — ABNORMAL LOW (ref 3.5–5.1)
Sodium: 138 mmol/L (ref 135–145)

## 2016-01-14 LAB — CBC WITH DIFFERENTIAL/PLATELET
Basophils Absolute: 0 10*3/uL (ref 0.0–0.1)
Basophils Relative: 0 %
Eosinophils Absolute: 0 10*3/uL (ref 0.0–0.7)
Eosinophils Relative: 0 %
HCT: 40.2 % (ref 36.0–46.0)
Hemoglobin: 13.2 g/dL (ref 12.0–15.0)
Lymphocytes Relative: 10 %
Lymphs Abs: 0.4 10*3/uL — ABNORMAL LOW (ref 0.7–4.0)
MCH: 28.9 pg (ref 26.0–34.0)
MCHC: 32.8 g/dL (ref 30.0–36.0)
MCV: 88.2 fL (ref 78.0–100.0)
Monocytes Absolute: 0.4 10*3/uL (ref 0.1–1.0)
Monocytes Relative: 10 %
Neutro Abs: 3.7 10*3/uL (ref 1.7–7.7)
Neutrophils Relative %: 80 %
Platelets: 226 10*3/uL (ref 150–400)
RBC: 4.56 MIL/uL (ref 3.87–5.11)
RDW: 13.8 % (ref 11.5–15.5)
WBC: 4.6 10*3/uL (ref 4.0–10.5)

## 2016-01-14 MED ORDER — SODIUM CHLORIDE 0.9 % IV SOLN: 12.5000 mg | Freq: Once | INTRAVENOUS | Status: DC

## 2016-01-14 MED ORDER — DIPHENHYDRAMINE HCL 50 MG/ML IJ SOLN
12.5000 mg | Freq: Once | INTRAMUSCULAR | Status: AC
  Administered 2016-01-14: 12.5 mg via INTRAVENOUS
  Filled 2016-01-14: qty 1

## 2016-01-14 MED ORDER — HALOPERIDOL LACTATE 5 MG/ML IJ SOLN
5.0000 mg | Freq: Once | INTRAMUSCULAR | Status: AC
  Administered 2016-01-14: 5 mg via INTRAVENOUS
  Filled 2016-01-14: qty 1

## 2016-01-14 MED ORDER — LABETALOL HCL 5 MG/ML IV SOLN
10.0000 mg | Freq: Once | INTRAVENOUS | Status: AC
  Administered 2016-01-14: 10 mg via INTRAVENOUS
  Filled 2016-01-14: qty 4

## 2016-01-14 MED ORDER — OXYCODONE-ACETAMINOPHEN 5-325 MG PO TABS
2.0000 | ORAL_TABLET | Freq: Once | ORAL | Status: AC
  Administered 2016-01-14: 2 via ORAL
  Filled 2016-01-14: qty 2

## 2016-01-14 MED ORDER — PROCHLORPERAZINE EDISYLATE 5 MG/ML IJ SOLN
10.0000 mg | Freq: Once | INTRAMUSCULAR | Status: AC
  Administered 2016-01-14: 10 mg via INTRAVENOUS
  Filled 2016-01-14: qty 2

## 2016-01-14 MED ORDER — ONDANSETRON HCL 4 MG/2ML IJ SOLN
4.0000 mg | Freq: Once | INTRAMUSCULAR | Status: AC
  Administered 2016-01-14: 4 mg via INTRAVENOUS
  Filled 2016-01-14: qty 2

## 2016-01-14 MED ORDER — METOPROLOL TARTRATE 5 MG/5ML IV SOLN
5.0000 mg | Freq: Once | INTRAVENOUS | Status: AC
  Administered 2016-01-14: 5 mg via INTRAVENOUS
  Filled 2016-01-14: qty 5

## 2016-01-14 MED ORDER — LISINOPRIL 10 MG PO TABS
10.0000 mg | ORAL_TABLET | Freq: Once | ORAL | Status: AC
  Administered 2016-01-14: 10 mg via ORAL
  Filled 2016-01-14: qty 1

## 2016-01-14 MED ORDER — MORPHINE SULFATE (PF) 4 MG/ML IV SOLN
4.0000 mg | Freq: Once | INTRAVENOUS | Status: AC
  Administered 2016-01-14: 4 mg via INTRAVENOUS
  Filled 2016-01-14: qty 1

## 2016-01-14 NOTE — ED Provider Notes (Signed)
[facility]-EMERGENCY DEPT Provider Note   CSN: [id number] Arrival date & time: 01/14/16  1040  First Provider Contact:  None       History   Chief Complaint Chief Complaint  Patient presents with  . Migraine    HPI Carla West is a 55 y.o. female.  HPI Patient presents to the emergency room with complaints of a migraine headache and hypertension. Patient states last evening she started having headache. She wasn't sure if it was a migraine or if she was starting to come down with a flu type illness.  She had a little bit of a sore throat but not now.  She denies any fevers. No rashes. No neck stiffness.  The patient does have a history of hypertension. She has been taking her medications. She's had similar trouble in the past with developing headaches when her blood pressure becomes very high. Past Medical History:  Diagnosis Date  . Abnormal Pap smear of cervix 08/03/2015  . Benign essential HTN 08/03/2015  . History of chicken pox   . Hypertension   . Migraine headache 08/03/2015  . Migraines     Patient Active Problem List   Diagnosis Date Noted  . Benign essential HTN 08/03/2015  . Migraine headache 08/03/2015  . Abnormal Pap smear of cervix 08/03/2015  . History of chicken pox     Past Surgical History:  Procedure Laterality Date  . TUBAL LIGATION      OB History    No data available       Home Medications    Prior to Admission medications   Medication Sig Start Date End Date Taking? Authorizing Provider  amitriptyline (ELAVIL) 100 MG tablet Take 1 tablet (100 mg total) by mouth at bedtime. 07/29/15  Yes Bradd Canary, MD  butalbital-aspirin-caffeine [facility]) [phone] MG capsule Take 1 capsule by mouth 2 (two) times daily as needed for headache. 07/29/15  Yes Bradd Canary, MD  HYDROcodone-acetaminophen (NORCO/VICODIN) 5-325 MG tablet Take 1-2 tablets by mouth every 4 (four) hours as needed for moderate pain or severe pain. 10/10/15  Yes Bradd Canary, MD    lisinopril (PRINIVIL,ZESTRIL) 10 MG tablet Take 1 tablet (10 mg total) by mouth daily. 07/29/15  Yes Bradd Canary, MD  metoprolol (TOPROL XL) 200 MG 24 hr tablet Take 1 tablet (200 mg total) by mouth daily. 07/29/15  Yes Bradd Canary, MD  ondansetron (ZOFRAN ODT) 4 MG disintegrating tablet Take 1 tablet (4 mg total) by mouth every 8 (eight) hours as needed for nausea or vomiting. 07/31/15  Yes Edward Saguier, PA-C  promethazine (PHENERGAN) 25 MG suppository Use one twice daily as needed for nausea and vomiting. 10/10/15  Yes Bradd Canary, MD    Family History Family History  Problem Relation Age of Onset  . Hypertension Mother   . Heart disease Mother   . Hyperlipidemia Father   . Hyperlipidemia Sister   . Other Brother     plan crash in [employer]  . Migraines Son   . Heart disease Brother     CHF, arrythmia  . Hypertension Brother   . Gout Brother   . Hypertension Brother     Social History Social History  Substance Use Topics  . Smoking status: Never Smoker  . Smokeless tobacco: Not on file  . Alcohol use No     Allergies   Review of patient's allergies indicates no known allergies.   Review of Systems Review of Systems  All other systems  reviewed and are negative.    Physical Exam Updated Vital Signs BP (!) 207/129   Pulse 78   Temp 98.7 F (37.1 C) (Oral)   Resp 18   SpO2 100%   Physical Exam  Constitutional: She appears well-developed and well-nourished. No distress.  HENT:  Head: Normocephalic and atraumatic.  Right Ear: External ear normal.  Left Ear: External ear normal.  Eyes: Conjunctivae are normal. Right eye exhibits no discharge. Left eye exhibits no discharge. No scleral icterus.  Neck: Normal range of motion. Neck supple. No tracheal deviation present.  No meningeal signs  Cardiovascular: Normal rate, regular rhythm and intact distal pulses.   Pulmonary/Chest: Effort normal and breath sounds normal. No stridor. No respiratory distress.  She has no wheezes. She has no rales.  Abdominal: Soft. Bowel sounds are normal. She exhibits no distension. There is no tenderness. There is no rebound and no guarding.  Musculoskeletal: She exhibits no edema or tenderness.  Neurological: She is alert. She has normal strength. No cranial nerve deficit (no facial droop, extraocular movements intact, no slurred speech) or sensory deficit. She exhibits normal muscle tone. She displays no seizure activity. Coordination normal.  Skin: Skin is warm and dry. No rash noted.  Psychiatric: She has a normal mood and affect.  Nursing note and vitals reviewed.    ED Treatments / Results  Labs (all labs ordered are listed, but only abnormal results are displayed) Labs Reviewed  CBC WITH DIFFERENTIAL/PLATELET - Abnormal; Notable for the following:       Result Value   Lymphs Abs 0.4 (*)    All other components within normal limits  BASIC METABOLIC PANEL - Abnormal; Notable for the following:    Potassium 3.4 (*)    Glucose, Bld 100 (*)    All other components within normal limits     Radiology Ct Head Wo Contrast  Result Date: 01/14/2016 CLINICAL DATA:  Right-sided headache and dizziness beginning last night. EXAM: CT HEAD WITHOUT CONTRAST TECHNIQUE: Contiguous axial images were obtained from the base of the skull through the vertex without intravenous contrast. COMPARISON:  CT head without contrast 05/04/2015 FINDINGS: No acute cortical infarct, hemorrhage, or mass lesion is present. The ventricles are of normal size. No significant extra-axial fluid collection is evident. The paranasal sinuses and mastoid air cells are clear. The calvarium is intact. No significant extracranial soft tissue lesion is present. The globes and orbits are intact. IMPRESSION: Negative CT of the head. Electronically Signed   By: Marin Roberts M.D.   On: 01/14/2016 16:32    Procedures Procedures (including critical care time)  Medications Ordered in  ED Medications  haloperidol lactate (HALDOL) injection 5 mg (not administered)  prochlorperazine (COMPAZINE) injection 10 mg (10 mg Intravenous Given 01/14/16 1419)  metoprolol (LOPRESSOR) injection 5 mg (5 mg Intravenous Given 01/14/16 1420)  diphenhydrAMINE (BENADRYL) injection 12.5 mg (12.5 mg Intravenous Given 01/14/16 1420)  labetalol (NORMODYNE,TRANDATE) injection 10 mg (10 mg Intravenous Given 01/14/16 1554)  morphine 4 MG/ML injection 4 mg (4 mg Intravenous Given 01/14/16 1604)  ondansetron (ZOFRAN) injection 4 mg (4 mg Intravenous Given 01/14/16 1601)     Initial Impression / Assessment and Plan / ED Course  I have reviewed the triage vital signs and the nursing notes.  Pertinent labs & imaging results that were available during my care of the patient were reviewed by me and considered in my medical decision making (see chart for details).  Clinical Course  Comment By Time  BP is  still elevated.  IV labetalol ordered.  Will ct head to rule out acute pathology Linwood Dibbles, MD 08/02 1532  Pt vomited.  Morphine and zofran ordered Linwood Dibbles, MD 08/02 1558   Pt presented with headache and HTN.  CT scan without acute findings.  Suspect HA is migraine related but could be component of htn urgency.  Will give additional dose of bp meds and additional med for migraine.  If no better may need admission.  Final Clinical Impressions(s) / ED Diagnoses   Final diagnoses:  Other migraine without status migrainosus, intractable  Hypertensive urgency      Linwood Dibbles, MD 01/14/16 1655

## 2016-01-14 NOTE — ED Provider Notes (Signed)
Patient signed out to me by Dr. Lynelle Doctor with complaints of headache and hypertension. Patient was hypertensive over 200 earlier in her visit. She has received labetalol and oral medication for high blood pressure. Her blood pressures currently down to a systolic of 150 and her symptoms have completely resolved. She is advised regarding return precautions and need for follow-up and voices understanding.   Carla Grizzle, MD 01/14/16 2031

## 2016-01-14 NOTE — ED Notes (Signed)
The pt returned from c-t  Headache better bp lower

## 2016-01-14 NOTE — ED Triage Notes (Signed)
Pt here c/o migraine h/a since last night, also BP at home 170 sytstolic with hx of migraines and HTN. Took BP meds this morning. H/a is right sided with photophobia, is a x 4. Ambulatory. BP 176/130.

## 2016-01-20 ENCOUNTER — Telehealth: Payer: Self-pay | Admitting: Family Medicine

## 2016-01-20 NOTE — Telephone Encounter (Signed)
That is fine to do.  Of course if admitted you will have to wait until released of course.

## 2016-01-20 NOTE — Telephone Encounter (Signed)
When calling pt's brother to schedule appt. He mentioned that sister (pt) was in the Emergency room also.    Should I call pt to schedule F/U?

## 2016-01-29 NOTE — Telephone Encounter (Signed)
Called pt several times to schedule FU appt. Unable to get through.    Please schedule if pt calls back in.

## 2016-04-02 ENCOUNTER — Ambulatory Visit (INDEPENDENT_AMBULATORY_CARE_PROVIDER_SITE_OTHER): Payer: PRIVATE HEALTH INSURANCE | Admitting: Family

## 2016-04-02 ENCOUNTER — Telehealth: Payer: Self-pay | Admitting: Family Medicine

## 2016-04-02 ENCOUNTER — Encounter: Payer: Self-pay | Admitting: Family

## 2016-04-02 DIAGNOSIS — G43809 Other migraine, not intractable, without status migrainosus: Secondary | ICD-10-CM

## 2016-04-02 DIAGNOSIS — I10 Essential (primary) hypertension: Secondary | ICD-10-CM

## 2016-04-02 MED ORDER — METOPROLOL SUCCINATE ER 200 MG PO TB24: 200.0000 mg | ORAL_TABLET | Freq: Every day | ORAL | 3 refills | Status: DC

## 2016-04-02 MED ORDER — ONDANSETRON 4 MG PO TBDP: 4.0000 mg | ORAL_TABLET | Freq: Three times a day (TID) | ORAL | 0 refills | Status: DC | PRN

## 2016-04-02 MED ORDER — SUMATRIPTAN SUCCINATE 50 MG PO TABS: ORAL_TABLET | ORAL | 5 refills | Status: DC

## 2016-04-02 MED ORDER — CLONIDINE HCL 0.1 MG PO TABS
0.1000 mg | ORAL_TABLET | Freq: Once | ORAL | Status: AC
  Administered 2016-04-02: 0.1 mg via ORAL

## 2016-04-02 MED ORDER — BUTALBITAL-ASA-CAFFEINE 50-325-40 MG PO CAPS: 1.0000 | ORAL_CAPSULE | Freq: Two times a day (BID) | ORAL | 0 refills | Status: DC | PRN

## 2016-04-02 MED ORDER — LISINOPRIL 20 MG PO TABS: 20.0000 mg | ORAL_TABLET | Freq: Every day | ORAL | 1 refills | Status: DC

## 2016-04-02 NOTE — Telephone Encounter (Signed)
Caller name: Relationship to patient: Self  Can be reached: [phone]   Reason for call: Patient called to ask if she can get an out of work note for today and tomorrow because of her BP. [facility]

## 2016-04-02 NOTE — Assessment & Plan Note (Addendum)
Likely worsened by recent elevated BP.  She has responded to imitrex in the past, will give trial of prn imitrex.  She reports that she was intolerant to elavil.  Reports good compliance with her medications but per refill hx she should have been out of medications  In May.

## 2016-04-02 NOTE — Progress Notes (Signed)
Pre visit review using our clinic review tool, if applicable. No additional management support is needed unless otherwise documented below in the visit note. 

## 2016-04-02 NOTE — Telephone Encounter (Signed)
Ok with me 

## 2016-04-02 NOTE — Telephone Encounter (Signed)
Pt says that she would like to switch providers [facility]. Is this switch okay?      CB: [phone]   Pt has to come back in 2 weeks per AVS (per Melissa), pt would like to schedule cpe. Is this okay,Melissa ?

## 2016-04-02 NOTE — Progress Notes (Signed)
Subjective:    Patient ID: [name], female    DOB: 1960/06/26, 55 y.o.   MRN: [id number]  HPI  Carla West is  55 yr old female who presents today for follow up.  1) Migraine- patient reports increased frequency of migraines. Typical pattern is once a month, she is now having once every 2 weeks. She has not used elavil in 3 days.  Makes her jittery and hard to sleep.  She used fiorinal as needed for migraines.  She is on metoprolol but is out currently.   2) HTN- notes elevated BP's at home.   BP Readings from Last 3 Encounters:  04/02/16 (!) 166/102  01/14/16 141/91  07/31/15 122/84      Review of Systems    see HPI  Past Medical History:  Diagnosis Date  . Abnormal Pap smear of cervix 08/03/2015  . Benign essential HTN 08/03/2015  . History of chicken pox   . Hypertension   . Migraine headache 08/03/2015  . Migraines      Social History   Social History  . Marital status: Divorced    Spouse name: N/A  . Number of children: N/A  . Years of education: N/A   Occupational History  . Not on file.   Social History Main Topics  . Smoking status: Never Smoker  . Smokeless tobacco: Not on file  . Alcohol use No  . Drug use: No  . 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 Not on file   Social History Narrative  . No narrative on file    Past Surgical History:  Procedure Laterality Date  . TUBAL LIGATION      Family History  Problem Relation Age of Onset  . Hypertension Mother   . Heart disease Mother   . Hyperlipidemia Father   . Hyperlipidemia Sister   . Other Brother     [place] [employer]  . Migraines Son   . Heart disease Brother     CHF, arrythmia  . Hypertension Brother   . Gout Brother   . Hypertension Brother     Allergies  Allergen Reactions  . Elavil [Amitriptyline Hcl]     Current Outpatient Prescriptions on File Prior to  Visit  Medication Sig Dispense Refill  . amitriptyline (ELAVIL) 100 MG tablet Take 1 tablet (100 mg total) by mouth at bedtime. 30 tablet 3  . HYDROcodone-acetaminophen (NORCO/VICODIN) 5-325 MG tablet Take 1-2 tablets by mouth every 4 (four) hours as needed for moderate pain or severe pain. 30 tablet 0  . promethazine (PHENERGAN) 25 MG suppository Use one twice daily as needed for nausea and vomiting. 30 each 2   No current facility-administered medications on file prior to visit.     BP (!) 166/102   Pulse 80   Temp 98 F (36.7 C) (Oral)   Resp 16   Ht 5\' 7"  (1.702 m)   Wt 154 lb 9.6 oz (70.1 kg)   SpO2 99%   BMI 24.21 kg/m    Objective:   Physical Exam  Constitutional: She appears well-developed and well-nourished.  HENT:  Head: Normocephalic and atraumatic.  Cardiovascular: Normal rate, regular rhythm and normal heart sounds.   No murmur heard. Pulmonary/Chest: Effort normal and breath sounds normal. No respiratory distress. She has no wheezes.  Musculoskeletal: She exhibits no edema.  Psychiatric: She has a normal mood and  affect. Her behavior is normal. Judgment and thought content normal.          Assessment & Plan:

## 2016-04-02 NOTE — Assessment & Plan Note (Addendum)
Uncontrolled. Increase lisinopril from 10mg  ot 20mg .  Follow up in 2 weeks for BP recheck and blood work. Clonidine given 0.1mg  PO x 1 today.  Following administration of clonidine, patient's BP was improved at 166/102.

## 2016-04-02 NOTE — Patient Instructions (Signed)
If you develop migraine you may take a dose of imitrex.  Reserve fiorinal only for use if headache is not resolved with imitrex.  Increase lisinopril form 10mg  to 20mg .

## 2016-04-02 NOTE — Telephone Encounter (Signed)
OK with me.

## 2016-04-02 NOTE — Telephone Encounter (Signed)
Letter faxed to [phone] per patient request.   KP

## 2016-04-15 NOTE — Telephone Encounter (Signed)
Pt has been scheduled.  °

## 2016-04-15 NOTE — Telephone Encounter (Signed)
Ok

## 2016-04-19 ENCOUNTER — Telehealth: Payer: Self-pay | Admitting: *Deleted

## 2016-04-19 NOTE — Telephone Encounter (Signed)
Attempted to reach pt for pre-visit call. Received message stating the the mailbox was full and could not leave message.

## 2016-04-20 ENCOUNTER — Ambulatory Visit: Payer: PRIVATE HEALTH INSURANCE | Admitting: Family

## 2016-05-19 ENCOUNTER — Ambulatory Visit (INDEPENDENT_AMBULATORY_CARE_PROVIDER_SITE_OTHER): Payer: PRIVATE HEALTH INSURANCE | Admitting: Family

## 2016-05-19 ENCOUNTER — Encounter: Payer: Self-pay | Admitting: Family

## 2016-05-19 VITALS — BP 174/122 | HR 69 | Temp 97.9°F | Resp 18 | Ht 66.0 in | Wt 157.6 lb

## 2016-05-19 DIAGNOSIS — I10 Essential (primary) hypertension: Secondary | ICD-10-CM | POA: Diagnosis not present

## 2016-05-19 DIAGNOSIS — Z0000 Encounter for general adult medical examination without abnormal findings: Secondary | ICD-10-CM

## 2016-05-19 MED ORDER — METOPROLOL TARTRATE 50 MG PO TABS: 50.0000 mg | ORAL_TABLET | Freq: Two times a day (BID) | ORAL | 2 refills | Status: DC

## 2016-05-19 MED ORDER — AMITRIPTYLINE HCL 100 MG PO TABS: 100.0000 mg | ORAL_TABLET | Freq: Every day | ORAL | 3 refills | Status: DC

## 2016-05-19 MED ORDER — CLONIDINE HCL 0.1 MG PO TABS
0.1000 mg | ORAL_TABLET | Freq: Once | ORAL | Status: AC
  Administered 2016-05-19: 0.1 mg via ORAL

## 2016-05-19 MED ORDER — LISINOPRIL 40 MG PO TABS: 40.0000 mg | ORAL_TABLET | Freq: Every day | ORAL | 3 refills | Status: DC

## 2016-05-19 MED ORDER — ONDANSETRON 4 MG PO TBDP: 4.0000 mg | ORAL_TABLET | Freq: Three times a day (TID) | ORAL | 0 refills | Status: DC | PRN

## 2016-05-19 MED ORDER — BUTALBITAL-ASA-CAFFEINE 50-325-40 MG PO CAPS: 1.0000 | ORAL_CAPSULE | Freq: Two times a day (BID) | ORAL | 0 refills | Status: DC | PRN

## 2016-05-19 NOTE — Patient Instructions (Addendum)
Take an additional 20mg  of lisinopril today. This evening take 50mg  of metoprolol, and beginning tomorrow you should continue metoprolol 50mg  twice daily and lisinopril 40mg  once daily. If BP >200/120, go to the ER.  If BP is <110/60, hold BP medication and call us. You will be contacted about your referral for colonoscopy. Complete lab work prior to leaving.

## 2016-05-19 NOTE — Progress Notes (Signed)
Subjective:    Patient ID: Unknown Carla West, female    DOB: [birth year], 55 y.o.   MRN: [id number]  HPI  Ms. Carla West is a 55 yr old female who presents today to establish care with me. She has been followed by another provider at our clinic previously. Pmhx is significant for the following:  1) HTN- last visit her lisinopril was increased from 10mg  to 20mg . She is only taking 50mg  of toprol xl.  States that the 200mg  dose was going to be too expensive so she never picked it up.   BP Readings from Last 3 Encounters:  05/19/16 (!) 199/123  04/02/16 (!) 166/102  01/14/16 141/91    She reports + abdominal bloating/gas.  She has not changed her diet. Notes normal BM's.  Review of Systems    see HPI  Past Medical History:  Diagnosis Date  . Abnormal Pap smear of cervix 08/03/2015  . Benign essential HTN 08/03/2015  . History of chicken pox   . Migraine headache 08/03/2015     Social History   Social History  . Marital status: Divorced    Spouse name: N/A  . Number of children: N/A  . Years of education: N/A   Occupational History  . Not on file.   Social History Main Topics  . Smoking status: Never Smoker  . Smokeless tobacco: Not on file  . Alcohol use No  . Drug use: No  . 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 Not on file   Social History Narrative  . No narrative on file    Past Surgical History:  Procedure Laterality Date  . TUBAL LIGATION      Family History  Problem Relation Age of Onset  . Hypertension Mother   . Heart disease Mother   . Hyperlipidemia Father   . Hyperlipidemia Sister   . Other Brother     [place] [employer]  . Migraines Son   . Heart disease Brother     CHF, arrythmia  . Hypertension Brother   . Gout Brother   . Hypertension Brother     Allergies  Allergen Reactions  . Elavil [Amitriptyline Hcl]     Current  Outpatient Prescriptions on File Prior to Visit  Medication Sig Dispense Refill  . amitriptyline (ELAVIL) 100 MG tablet Take 1 tablet (100 mg total) by mouth at bedtime. 30 tablet 3  . butalbital-aspirin-caffeine (FIORINAL) 50-325-40 MG capsule Take 1 capsule by mouth 2 (two) times daily as needed for headache. 30 capsule 0  . HYDROcodone-acetaminophen (NORCO/VICODIN) 5-325 MG tablet Take 1-2 tablets by mouth every 4 (four) hours as needed for moderate pain or severe pain. 30 tablet 0  . lisinopril (PRINIVIL,ZESTRIL) 20 MG tablet Take 1 tablet (20 mg total) by mouth daily. 30 tablet 1  . metoprolol (TOPROL XL) 200 MG 24 hr tablet Take 1 tablet (200 mg total) by mouth daily. 30 tablet 3  . ondansetron (ZOFRAN ODT) 4 MG disintegrating tablet Take 1 tablet (4 mg total) by mouth every 8 (eight) hours as needed for nausea or vomiting. 20 tablet 0  . promethazine (PHENERGAN) 25 MG suppository Use one twice daily as needed for nausea and vomiting. 30 each 2  . SUMAtriptan (IMITREX) 50 MG tablet 1 tab by mouth at start of migraine, may repeat in 2 hrs as needed (max 2 tabs in 24 hours) 10 tablet  5   No current facility-administered medications on file prior to visit.     BP (!) 199/123 (BP Location: Right Arm, Cuff Size: Normal)   Pulse 67   Temp 97.9 F (36.6 C) (Oral)   Resp 18   Ht 5\' 6"  (1.676 m)   Wt 157 lb 9.6 oz (71.5 kg)   SpO2 100% Comment: room air  BMI 25.44 kg/m    Objective:   Physical Exam  Constitutional: She is oriented to person, place, and time. She appears well-developed and well-nourished.  HENT:  Head: Normocephalic and atraumatic.  Cardiovascular: Normal rate, regular rhythm and normal heart sounds.   No murmur heard. Pulmonary/Chest: Effort normal and breath sounds normal. No respiratory distress. She has no wheezes.  Neurological: She is alert and oriented to person, place, and time.  Psychiatric: She has a normal mood and affect. Her behavior is normal. Judgment  and thought content normal.          Assessment & Plan:  HTN- patient was given 0.1mg  clonidine, and BP remained elevated.  Pt given another 0.1mg  and follow up BP 174/122.  Advised pt as follows:  Take an additional 20mg  of lisinopril today. This evening take 50mg  of metoprolol, and beginning tomorrow you should continue metoprolol 50mg  twice daily and lisinopril 40mg  once daily. If BP >200/120, go to the ER.  If BP is <110/60, hold BP medication and call us. You will be contacted about your referral for colonoscopy. Complete lab work prior to leaving.

## 2016-05-19 NOTE — Progress Notes (Signed)
Pre visit review using our clinic review tool, if applicable. No additional management support is needed unless otherwise documented below in the visit note. 

## 2016-05-28 ENCOUNTER — Ambulatory Visit: Payer: PRIVATE HEALTH INSURANCE | Admitting: Family

## 2016-05-29 ENCOUNTER — Ambulatory Visit (HOSPITAL_BASED_OUTPATIENT_CLINIC_OR_DEPARTMENT_OTHER): Payer: PRIVATE HEALTH INSURANCE

## 2016-06-28 ENCOUNTER — Emergency Department (HOSPITAL_COMMUNITY)
Admission: EM | Admit: 2016-06-28 | Discharge: 2016-06-28 | Disposition: A | Discharge status: Home / Self Care | Payer: PRIVATE HEALTH INSURANCE | Attending: Emergency Medicine | Admitting: Emergency Medicine

## 2016-06-28 ENCOUNTER — Encounter (HOSPITAL_COMMUNITY): Payer: Self-pay | Admitting: *Deleted

## 2016-06-28 ENCOUNTER — Emergency Department (HOSPITAL_COMMUNITY): Payer: PRIVATE HEALTH INSURANCE

## 2016-06-28 DIAGNOSIS — J111 Influenza due to unidentified influenza virus with other respiratory manifestations: Secondary | ICD-10-CM

## 2016-06-28 DIAGNOSIS — R69 Illness, unspecified: Secondary | ICD-10-CM

## 2016-06-28 DIAGNOSIS — R197 Diarrhea, unspecified: Secondary | ICD-10-CM | POA: Insufficient documentation

## 2016-06-28 DIAGNOSIS — J069 Acute upper respiratory infection, unspecified: Secondary | ICD-10-CM | POA: Diagnosis not present

## 2016-06-28 LAB — CBC
HCT: 33.2 % — ABNORMAL LOW (ref 36.0–46.0)
Hemoglobin: 11 g/dL — ABNORMAL LOW (ref 12.0–15.0)
MCH: 28.7 pg (ref 26.0–34.0)
MCHC: 33.1 g/dL (ref 30.0–36.0)
MCV: 86.7 fL (ref 78.0–100.0)
Platelets: 165 10*3/uL (ref 150–400)
RBC: 3.83 MIL/uL — ABNORMAL LOW (ref 3.87–5.11)
RDW: 14.1 % (ref 11.5–15.5)
WBC: 3.3 10*3/uL — ABNORMAL LOW (ref 4.0–10.5)

## 2016-06-28 LAB — COMPREHENSIVE METABOLIC PANEL
ALT: 22 U/L (ref 14–54)
AST: 32 U/L (ref 15–41)
Albumin: 3.5 g/dL (ref 3.5–5.0)
Alkaline Phosphatase: 62 U/L (ref 38–126)
Anion gap: 9 (ref 5–15)
BUN: 15 mg/dL (ref 6–20)
CO2: 24 mmol/L (ref 22–32)
Calcium: 8.9 mg/dL (ref 8.9–10.3)
Chloride: 106 mmol/L (ref 101–111)
Creatinine, Ser: 0.92 mg/dL (ref 0.44–1.00)
GFR calc Af Amer: >60 mL/min (ref >60)
GFR calc non Af Amer: >60 mL/min (ref >60)
Glucose, Bld: 127 mg/dL — ABNORMAL HIGH (ref 65–99)
Potassium: 3.1 mmol/L — ABNORMAL LOW (ref 3.5–5.1)
Sodium: 139 mmol/L (ref 135–145)
Total Bilirubin: 0.7 mg/dL (ref 0.3–1.2)
Total Protein: 6.8 g/dL (ref 6.5–8.1)

## 2016-06-28 LAB — I-STAT TROPONIN, ED: Troponin i, poc: 0.01 ng/mL (ref 0.00–0.08)

## 2016-06-28 LAB — LIPASE, BLOOD: Lipase: 18 U/L (ref 11–51)

## 2016-06-28 MED ORDER — OSELTAMIVIR PHOSPHATE 75 MG PO CAPS: 75.0000 mg | ORAL_CAPSULE | Freq: Two times a day (BID) | ORAL | 0 refills | Status: DC

## 2016-06-28 MED ORDER — ONDANSETRON 8 MG PO TBDP: 8.0000 mg | ORAL_TABLET | Freq: Three times a day (TID) | ORAL | 0 refills | Status: DC | PRN

## 2016-06-28 MED ORDER — ACETAMINOPHEN 325 MG PO TABS
ORAL_TABLET | ORAL | Status: AC
  Filled 2016-06-28: qty 2

## 2016-06-28 MED ORDER — ONDANSETRON HCL 4 MG/2ML IJ SOLN
4.0000 mg | Freq: Once | INTRAMUSCULAR | Status: AC
  Administered 2016-06-28: 4 mg via INTRAVENOUS
  Filled 2016-06-28: qty 2

## 2016-06-28 MED ORDER — SODIUM CHLORIDE 0.9 % IV BOLUS (SEPSIS)
2000.0000 mL | Freq: Once | INTRAVENOUS | Status: AC
  Administered 2016-06-28: 2000 mL via INTRAVENOUS

## 2016-06-28 MED ORDER — ALBUTEROL SULFATE HFA 108 (90 BASE) MCG/ACT IN AERS
2.0000 | INHALATION_SPRAY | RESPIRATORY_TRACT | Status: DC
  Administered 2016-06-28: 2 via RESPIRATORY_TRACT
  Filled 2016-06-28: qty 6.7

## 2016-06-28 MED ORDER — MORPHINE SULFATE (PF) 4 MG/ML IV SOLN
4.0000 mg | Freq: Once | INTRAVENOUS | Status: AC
  Administered 2016-06-28: 4 mg via INTRAVENOUS
  Filled 2016-06-28: qty 1

## 2016-06-28 MED ORDER — ACETAMINOPHEN 325 MG PO TABS
650.0000 mg | ORAL_TABLET | Freq: Once | ORAL | Status: AC
  Administered 2016-06-28: 650 mg via ORAL

## 2016-06-28 MED ORDER — KETOROLAC TROMETHAMINE 30 MG/ML IJ SOLN
30.0000 mg | Freq: Once | INTRAMUSCULAR | Status: AC
  Administered 2016-06-28: 30 mg via INTRAVENOUS
  Filled 2016-06-28: qty 1

## 2016-06-28 NOTE — ED Provider Notes (Signed)
[facility]-EMERGENCY DEPT Provider Note   CSN: [id number] Arrival date & time: 06/28/16  1006     History   Chief Complaint Chief Complaint  Patient presents with  . Palpitations  . URI  . Diarrhea    HPI Carla West is a 56 y.o. female.  HPI Patient presents emergency department with headache and diarrhea as well as upper respiratory symptoms and productive cough.  No recent sick contacts.  She reports nausea without vomiting.  She denies diarrhea.  Patient found to have low-grade fever of 100.9 on arrival to the emergency department.  She states she feels achy and weak.  No other complaints at this time.  Symptoms are moderate in severity   Past Medical History:  Diagnosis Date  . Abnormal Pap smear of cervix 08/03/2015  . Benign essential HTN 08/03/2015  . History of chicken pox   . Migraine headache 08/03/2015    Patient Active Problem List   Diagnosis Date Noted  . Benign essential HTN 08/03/2015  . Migraine headache 08/03/2015  . Abnormal Pap smear of cervix 08/03/2015  . History of chicken pox     Past Surgical History:  Procedure Laterality Date  . TUBAL LIGATION      OB History    No data available       Home Medications    Prior to Admission medications   Medication Sig Start Date End Date Taking? Authorizing Provider  amitriptyline (ELAVIL) 100 MG tablet Take 1 tablet (100 mg total) by mouth at bedtime. 05/19/16   Sandford Craze, NP  butalbital-aspirin-caffeine [facility]) 50-325-40 MG capsule Take 1 capsule by mouth 2 (two) times daily as needed for headache. 05/19/16   Sandford Craze, NP  HYDROcodone-acetaminophen (NORCO/VICODIN) 5-325 MG tablet Take 1-2 tablets by mouth every 4 (four) hours as needed for moderate pain or severe pain. 10/10/15   Bradd Canary, MD  lisinopril (PRINIVIL,ZESTRIL) 40 MG tablet Take 1 tablet (40 mg total) by mouth daily. 05/19/16   Sandford Craze, NP  metoprolol (LOPRESSOR) 50 MG tablet Take 1 tablet (50 mg total)  by mouth 2 (two) times daily. 05/19/16   Sandford Craze, NP  ondansetron (ZOFRAN ODT) 8 MG disintegrating tablet Take 1 tablet (8 mg total) by mouth every 8 (eight) hours as needed for nausea or vomiting. 06/28/16   Azalia Bilis, MD  oseltamivir (TAMIFLU) 75 MG capsule Take 1 capsule (75 mg total) by mouth every 12 (twelve) hours. 06/28/16   Azalia Bilis, MD  promethazine (PHENERGAN) 25 MG suppository Use one twice daily as needed for nausea and vomiting. 10/10/15   Bradd Canary, MD  SUMAtriptan (IMITREX) 50 MG tablet 1 tab by mouth at start of migraine, may repeat in 2 hrs as needed (max 2 tabs in 24 hours) 04/02/16   Sandford Craze, NP    Family History Family History  Problem Relation Age of Onset  . Hypertension Mother   . Heart disease Mother   . Hyperlipidemia Father   . Hyperlipidemia Sister   . Other Brother     plan crash in [employer]  . Migraines Son   . Heart disease Brother     CHF, arrythmia  . Hypertension Brother   . Gout Brother   . Hypertension Brother     Social History Social History  Substance Use Topics  . Smoking status: Never Smoker  . Smokeless tobacco: Never Used  . Alcohol use No     Allergies   Elavil [amitriptyline hcl]   Review of  Systems Review of Systems  All other systems reviewed and are negative.    Physical Exam Updated Vital Signs BP (!) 172/109   Pulse 84   Temp 99.4 F (37.4 C) (Oral)   Resp 17   Ht 5\' 7"  (1.702 m)   Wt 150 lb (68 kg)   SpO2 95%   BMI 23.49 kg/m   Physical Exam  Constitutional: She is oriented to person, place, and time. She appears well-developed and well-nourished. No distress.  HENT:  Head: Normocephalic and atraumatic.  Eyes: EOM are normal.  Neck: Normal range of motion.  Cardiovascular: Normal rate, regular rhythm and normal heart sounds.   Pulmonary/Chest: Effort normal and breath sounds normal.  Abdominal: Soft. She exhibits no distension. There is no tenderness.  Musculoskeletal:  Normal range of motion.  Neurological: She is alert and oriented to person, place, and time.  Skin: Skin is warm and dry.  Psychiatric: She has a normal mood and affect. Judgment normal.  Nursing note and vitals reviewed.    ED Treatments / Results  Labs (all labs ordered are listed, but only abnormal results are displayed) Labs Reviewed  COMPREHENSIVE METABOLIC PANEL - Abnormal; Notable for the following:       Result Value   Potassium 3.1 (*)    Glucose, Bld 127 (*)    All other components within normal limits  CBC - Abnormal; Notable for the following:    WBC 3.3 (*)    RBC 3.83 (*)    Hemoglobin 11.0 (*)    HCT 33.2 (*)    All other components within normal limits  LIPASE, BLOOD  URINALYSIS, ROUTINE W REFLEX MICROSCOPIC  I-STAT TROPOININ, ED  I-STAT TROPOININ, ED    EKG  EKG Interpretation  Date/Time:  Monday June 28 2016 10:15:33 EST Ventricular Rate:  85 PR Interval:  170 QRS Duration: 76 QT Interval:  348 QTC Calculation: 414 R Axis:   81 Text Interpretation:  Normal sinus rhythm Left ventricular hypertrophy with repolarization abnormality Abnormal ECG No significant change was found Confirmed by Issabella Rix  MD, Caryn BeeKEVIN ([id number]) on 06/28/2016 11:37:11 AM       Radiology Dg Chest 2 View  Result Date: 06/28/2016 CLINICAL DATA:  Left chest pain, diarrhea, cough and fever beginning today. EXAM: CHEST  2 VIEW COMPARISON:  None. FINDINGS: Lungs are clear. Heart size is upper normal. No pneumothorax or pleural fluid. No bony abnormality. IMPRESSION: No acute disease. Electronically Signed   By: Drusilla Kannerhomas  Dalessio M.D.   On: 06/28/2016 12:23    Procedures Procedures (including critical care time)  Medications Ordered in ED Medications  albuterol (PROVENTIL HFA;VENTOLIN HFA) 108 (90 Base) MCG/ACT inhaler 2 puff (2 puffs Inhalation Given 06/28/16 1151)  acetaminophen (TYLENOL) tablet 650 mg (650 mg Oral Given 06/28/16 1037)  sodium chloride 0.9 % bolus 2,000 mL (2,000 mLs  Intravenous New Bag/Given 06/28/16 1158)  ondansetron (ZOFRAN) injection 4 mg (4 mg Intravenous Given 06/28/16 1158)  ketorolac (TORADOL) 30 MG/ML injection 30 mg (30 mg Intravenous Given 06/28/16 1158)  morphine 4 MG/ML injection 4 mg (4 mg Intravenous Given 06/28/16 1158)     Initial Impression / Assessment and Plan / ED Course  I have reviewed the triage vital signs and the nursing notes.  Pertinent labs & imaging results that were available during my care of the patient were reviewed by me and considered in my medical decision making (see chart for details).  Clinical Course     1:16 PM Likely influenza-like illness.  Patient feels better at this time.  Discharge home in good condition.  Final Clinical Impressions(s) / ED Diagnoses   Final diagnoses:  Influenza-like illness    New Prescriptions New Prescriptions   ONDANSETRON (ZOFRAN ODT) 8 MG DISINTEGRATING TABLET    Take 1 tablet (8 mg total) by mouth every 8 (eight) hours as needed for nausea or vomiting.   OSELTAMIVIR (TAMIFLU) 75 MG CAPSULE    Take 1 capsule (75 mg total) by mouth every 12 (twelve) hours.     Azalia Bilis, MD 06/28/16 1316

## 2016-06-28 NOTE — ED Notes (Signed)
Pt comfortable with discharge and follow up instructions. Pt esorted to waiting area. Rx x2

## 2016-06-28 NOTE — ED Notes (Signed)
Patient transported to X-ray 

## 2016-06-28 NOTE — ED Triage Notes (Addendum)
PT states palpitations, headache, upper resp s/s, diarrhea since yesterday.  Acuity increased d/t bp.

## 2016-07-05 ENCOUNTER — Encounter: Payer: Self-pay | Admitting: Gastroenterology

## 2016-08-05 ENCOUNTER — Ambulatory Visit: Payer: PRIVATE HEALTH INSURANCE | Admitting: Family Medicine

## 2016-08-05 ENCOUNTER — Ambulatory Visit (HOSPITAL_BASED_OUTPATIENT_CLINIC_OR_DEPARTMENT_OTHER): Payer: PRIVATE HEALTH INSURANCE

## 2016-08-05 ENCOUNTER — Telehealth: Payer: Self-pay | Admitting: Family Medicine

## 2016-08-05 NOTE — Telephone Encounter (Signed)
Pt called in at 8:00 to reschedule her appt. She said that she got called in to work unexpectedly. She rescheduled for in the morning with Melissa.

## 2016-08-06 ENCOUNTER — Encounter: Payer: Self-pay | Admitting: Family

## 2016-08-06 ENCOUNTER — Ambulatory Visit (INDEPENDENT_AMBULATORY_CARE_PROVIDER_SITE_OTHER): Payer: PRIVATE HEALTH INSURANCE | Admitting: Family

## 2016-08-06 ENCOUNTER — Encounter: Payer: Self-pay | Admitting: Family Medicine

## 2016-08-06 ENCOUNTER — Telehealth: Payer: Self-pay | Admitting: Family Medicine

## 2016-08-06 VITALS — BP 169/120 | HR 68 | Temp 97.8°F | Resp 16 | Ht 66.0 in | Wt 154.0 lb

## 2016-08-06 DIAGNOSIS — I10 Essential (primary) hypertension: Secondary | ICD-10-CM | POA: Diagnosis not present

## 2016-08-06 DIAGNOSIS — R002 Palpitations: Secondary | ICD-10-CM

## 2016-08-06 DIAGNOSIS — G43809 Other migraine, not intractable, without status migrainosus: Secondary | ICD-10-CM | POA: Diagnosis not present

## 2016-08-06 LAB — COMPREHENSIVE METABOLIC PANEL
ALT: 19 U/L (ref 0–35)
AST: 22 U/L (ref 0–37)
Albumin: 4.3 g/dL (ref 3.5–5.2)
Alkaline Phosphatase: 75 U/L (ref 39–117)
BUN: 17 mg/dL (ref 6–23)
CO2: 27 mEq/L (ref 19–32)
Calcium: 9.2 mg/dL (ref 8.4–10.5)
Chloride: 106 mEq/L (ref 96–112)
Creatinine, Ser: 0.91 mg/dL (ref 0.40–1.20)
GFR: 82.99 mL/min (ref >60.00)
Glucose, Bld: 83 mg/dL (ref 70–99)
Potassium: 3.6 mEq/L (ref 3.5–5.1)
Sodium: 141 mEq/L (ref 135–145)
Total Bilirubin: 0.4 mg/dL (ref 0.2–1.2)
Total Protein: 7.2 g/dL (ref 6.0–8.3)

## 2016-08-06 LAB — CBC WITH DIFFERENTIAL/PLATELET
Basophils Absolute: 0 10*3/uL (ref 0.0–0.1)
Basophils Relative: 0.4 % (ref 0.0–3.0)
Eosinophils Absolute: 0 10*3/uL (ref 0.0–0.7)
Eosinophils Relative: 0.9 % (ref 0.0–5.0)
HCT: 36.9 % (ref 36.0–46.0)
Hemoglobin: 12.4 g/dL (ref 12.0–15.0)
Lymphocytes Relative: 34.6 % (ref 12.0–46.0)
Lymphs Abs: 1.3 10*3/uL (ref 0.7–4.0)
MCHC: 33.6 g/dL (ref 30.0–36.0)
MCV: 86.4 fl (ref 78.0–100.0)
Monocytes Absolute: 0.4 10*3/uL (ref 0.1–1.0)
Monocytes Relative: 10.6 % (ref 3.0–12.0)
Neutro Abs: 2 10*3/uL (ref 1.4–7.7)
Neutrophils Relative %: 53.5 % (ref 43.0–77.0)
Platelets: 228 10*3/uL (ref 150.0–400.0)
RBC: 4.27 Mil/uL (ref 3.87–5.11)
RDW: 14.1 % (ref 11.5–15.5)
WBC: 3.7 10*3/uL — ABNORMAL LOW (ref 4.0–10.5)

## 2016-08-06 LAB — TSH: TSH: 0.54 u[IU]/mL (ref 0.35–4.50)

## 2016-08-06 MED ORDER — LOSARTAN POTASSIUM 50 MG PO TABS: 50.0000 mg | ORAL_TABLET | Freq: Every day | ORAL | 3 refills | Status: DC

## 2016-08-06 MED ORDER — SUMATRIPTAN SUCCINATE 50 MG PO TABS: ORAL_TABLET | ORAL | 5 refills | Status: DC

## 2016-08-06 MED ORDER — HYDROCHLOROTHIAZIDE 25 MG PO TABS: 25.0000 mg | ORAL_TABLET | Freq: Every day | ORAL | 3 refills | Status: DC

## 2016-08-06 NOTE — Telephone Encounter (Signed)
Refill sent. Notified pt. 

## 2016-08-06 NOTE — Patient Instructions (Signed)
Please complete lab work prior to leaving. Stop lisinopril, start losartan. Start HCTZ.

## 2016-08-06 NOTE — Progress Notes (Signed)
Subjective:    Patient ID: [name], female    DOB: 18-Dec-1960, 56 y.o.   MRN: [id number]  HPI  Carla West is a 56 yr old female who presents today for follow up.  1) HTN- Current medications include lisinopril, metoprolol.Reports that Carla West has been having sensation of "heart racing." Also has fatigue.  Reports cough on lisinopril.  BP Readings from Last 3 Encounters:  08/06/16 (!) 169/120  06/28/16 (!) 175/117  05/19/16 (!) 174/122   2) Migraine-  Reports increased frequency of headaches. Never got imitrex filled.    Review of Systems Past Medical History:  Diagnosis Date  . Abnormal Pap smear of cervix 08/03/2015  . Benign essential HTN 08/03/2015  . History of chicken pox   . Migraine headache 08/03/2015     Social History   Social History  . Marital status: Divorced    Spouse name: N/A  . Number of children: N/A  . Years of education: N/A   Occupational History  . Not on file.   Social History Main Topics  . Smoking status: Never Smoker  . Smokeless tobacco: Never Used  . Alcohol use No  . Drug use: No  . 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 Not on file   Social History Narrative  . No narrative on file    Past Surgical History:  Procedure Laterality Date  . TUBAL LIGATION      Family History  Problem Relation Age of Onset  . Hypertension Mother   . Heart disease Mother   . Hyperlipidemia Father   . Hyperlipidemia Sister   . Other Brother     [place] [employer]  . Migraines Son   . Heart disease Brother     CHF, arrythmia  . Hypertension Brother   . Gout Brother   . Hypertension Brother     Allergies  Allergen Reactions  . Elavil [Amitriptyline Hcl]     Current Outpatient Prescriptions on File Prior to Visit  Medication Sig Dispense Refill  . amitriptyline (ELAVIL) 100 MG tablet Take 1 tablet (100 mg total) by mouth at  bedtime. 30 tablet 3  . butalbital-aspirin-caffeine (FIORINAL) 50-325-40 MG capsule Take 1 capsule by mouth 2 (two) times daily as needed for headache. 30 capsule 0  . metoprolol (LOPRESSOR) 50 MG tablet Take 1 tablet (50 mg total) by mouth 2 (two) times daily. 60 tablet 2  . HYDROcodone-acetaminophen (NORCO/VICODIN) 5-325 MG tablet Take 1-2 tablets by mouth every 4 (four) hours as needed for moderate pain or severe pain. (Patient not taking: Reported on 08/06/2016) 30 tablet 0  . ondansetron (ZOFRAN ODT) 8 MG disintegrating tablet Take 1 tablet (8 mg total) by mouth every 8 (eight) hours as needed for nausea or vomiting. 10 tablet 0  . promethazine (PHENERGAN) 25 MG suppository Use one twice daily as needed for nausea and vomiting. 30 each 2  . SUMAtriptan (IMITREX) 50 MG tablet 1 tab by mouth at start of migraine, may repeat in 2 hrs as needed (max 2 tabs in 24 hours) (Patient not taking: Reported on 08/06/2016) 10 tablet 5   No current facility-administered medications on file prior to visit.     BP (!) 169/120 (BP Location: Right Arm, Cuff Size: Normal)   Pulse 68   Temp 97.8 F (36.6 C) (Oral)   Resp 16   Ht 5\' 6"  (1.676 m)  Wt 154 lb (69.9 kg)   SpO2 100% Comment: room air  BMI 24.86 kg/m       Objective:   Physical Exam  Constitutional: Carla West is oriented to person, place, and time. Carla West appears well-developed and well-nourished.  HENT:  Head: Normocephalic and atraumatic.  Cardiovascular: Normal rate, regular rhythm and normal heart sounds.   No murmur heard. Pulmonary/Chest: Effort normal and breath sounds normal. No respiratory distress. Carla West has no wheezes.  Musculoskeletal: Carla West exhibits no edema.  Neurological: Carla West is alert and oriented to person, place, and time.  Skin: Skin is warm and dry.  Psychiatric: Carla West has a normal mood and affect. Her behavior is normal. Judgment and thought content normal.          Assessment & Plan:  EKG is personally reviewed. It shows NSR  and is compared to previous EKG's- appears unchanged.

## 2016-08-06 NOTE — Progress Notes (Signed)
Pre visit review using our clinic review tool, if applicable. No additional management support is needed unless otherwise documented below in the visit note. 

## 2016-08-06 NOTE — Assessment & Plan Note (Signed)
Uncontrolled. Has not tried imitrex. She will get that filled and take in place of fiorinal.  Hopefully when her BP is improved, her headaches will also improve.

## 2016-08-06 NOTE — Telephone Encounter (Signed)
Relation to WU:JWJXpt:self Call back [phone] Pharmacy: [id number] [place] [address] [facility] [address] [facility] [address] [place] [phone] (Phone) [phone])     Reason for call:  Patient seen today and requesting SUMAtriptan (IMITREX) 50 MG tablet,

## 2016-08-06 NOTE — Assessment & Plan Note (Signed)
Uncontrolled. D/c lisinopril. Start losartan, add hctz.  Check baseline labs.

## 2016-08-09 ENCOUNTER — Encounter: Payer: Self-pay | Admitting: Family Medicine

## 2016-08-16 ENCOUNTER — Encounter: Payer: Self-pay | Admitting: Family

## 2016-08-16 ENCOUNTER — Ambulatory Visit (INDEPENDENT_AMBULATORY_CARE_PROVIDER_SITE_OTHER): Payer: PRIVATE HEALTH INSURANCE | Admitting: Family

## 2016-08-16 DIAGNOSIS — G43809 Other migraine, not intractable, without status migrainosus: Secondary | ICD-10-CM | POA: Diagnosis not present

## 2016-08-16 DIAGNOSIS — I10 Essential (primary) hypertension: Secondary | ICD-10-CM

## 2016-08-16 MED ORDER — OLMESARTAN-AMLODIPINE-HCTZ 20-5-12.5 MG PO TABS: 1.0000 | ORAL_TABLET | Freq: Every day | ORAL | 2 refills | Status: DC

## 2016-08-16 NOTE — Assessment & Plan Note (Signed)
Stable. Advised pt OK to remain off of elavil. If recurrent migraines, could consider restarting at a lower dose.

## 2016-08-16 NOTE — Assessment & Plan Note (Signed)
Uncontrolled. Will d/c losartan and hctz.  Begin tribenzor.  Follow up in 2 weeks for a nurse visit BP recheck and follow up bmet.

## 2016-08-16 NOTE — Progress Notes (Signed)
Pre visit review using our clinic review tool, if applicable. No additional management support is needed unless otherwise documented below in the visit note. 

## 2016-08-16 NOTE — Patient Instructions (Signed)
Stop HCTZ. Stop Losartan. Stop elavil.  Start Tribenzor.

## 2016-08-16 NOTE — Progress Notes (Signed)
Subjective:    Patient ID: [name], female    DOB: Jul 31, 1960, 56 y.o.   MRN: [id number]  HPI  Carla West is a 56 yr old female who presents today for follow up.   HTN- maintained on hctz, losartan. Denies CP/SOB or swelling.  BP Readings from Last 3 Encounters:  08/16/16 (!) 152/92  08/06/16 (!) 169/120  06/28/16 (!) 175/117   Migraine- reports that she has not been taking elavil. Thought she was taking but she wasn't. Took the last 2 nights and woke up very sleepy both days.  Reports that her migraines have been well controlle.    Review of Systems See HPI  Past Medical History:  Diagnosis Date  . Abnormal Pap smear of cervix 08/03/2015  . Benign essential HTN 08/03/2015  . History of chicken pox   . Migraine headache 08/03/2015     Social History   Social History  . Marital status: Divorced    Spouse name: N/A  . Number of children: N/A  . Years of education: N/A   Occupational History  . Not on file.   Social History Main Topics  . Smoking status: Never Smoker  . Smokeless tobacco: Never Used  . Alcohol use No  . Drug use: No  . 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 Not on file   Social History Narrative  . No narrative on file    Past Surgical History:  Procedure Laterality Date  . TUBAL LIGATION      Family History  Problem Relation Age of Onset  . Hypertension Mother   . Heart disease Mother   . Hyperlipidemia Father   . Hyperlipidemia Sister   . Other Brother     plan crash [place] [employer]  . Migraines Son   . Heart disease Brother     CHF, arrythmia  . Hypertension Brother   . Gout Brother   . Hypertension Brother     Allergies  Allergen Reactions  . Elavil [Amitriptyline Hcl]     Current Outpatient Prescriptions on File Prior to Visit  Medication Sig Dispense Refill  . metoprolol (LOPRESSOR) 50 MG tablet Take 1 tablet  (50 mg total) by mouth 2 (two) times daily. 60 tablet 2  . HYDROcodone-acetaminophen (NORCO/VICODIN) 5-325 MG tablet Take 1-2 tablets by mouth every 4 (four) hours as needed for moderate pain or severe pain. (Patient not taking: Reported on 08/06/2016) 30 tablet 0   No current facility-administered medications on file prior to visit.     BP (!) 152/92 (BP Location: Left Arm, Patient Position: Sitting, Cuff Size: Normal)   Pulse 75   Temp 97.8 F (36.6 C) (Oral)   Ht 5\' 7"  (1.702 m)   Wt 156 lb 4 oz (70.9 kg)   SpO2 94%   BMI 24.47 kg/m       Objective:   Physical Exam  Constitutional: She is oriented to person, place, and time. She appears well-developed and well-nourished.  HENT:  Head: Normocephalic and atraumatic.  Cardiovascular: Normal rate, regular rhythm and normal heart sounds.   No murmur heard. Pulmonary/Chest: Effort normal and breath sounds normal. No respiratory distress. She has no wheezes.  Musculoskeletal: She exhibits no edema.  Neurological: She is alert and oriented to person, place, and time.  Psychiatric: She has a normal mood and affect. Her behavior is normal. Judgment and thought content normal.  Assessment & Plan:

## 2016-08-18 NOTE — Telephone Encounter (Signed)
Pt called in because she received a no show fee. Never received a response back from provider on whether to charge pt or not.   Please advise, pt is asking to have fee waived.

## 2016-08-18 NOTE — Telephone Encounter (Signed)
Ok to waive. Sorry, I remember seeing that and thought I had responded. TY.

## 2016-08-27 ENCOUNTER — Encounter: Payer: PRIVATE HEALTH INSURANCE | Admitting: Gastroenterology

## 2016-09-21 ENCOUNTER — Telehealth: Payer: Self-pay | Admitting: Family Medicine

## 2016-09-21 ENCOUNTER — Encounter: Payer: Self-pay | Admitting: Family Medicine

## 2016-09-21 NOTE — Telephone Encounter (Signed)
°  Relation to pt: self  Call back [phone] Pharmacy: [id number] - [place], [place] - [address] AT [facility] [address] [facility] [place] [phone] (Fax)     Reason for call:  HYDROcodone-acetaminophen (NORCO/VICODIN) 5-325 MG tablet, metoprolol (LOPRESSOR) 50 MG tablet and Olmesartan-Amlodipine-HCTZ 20-5-12.5 MG TABS

## 2016-09-21 NOTE — Telephone Encounter (Signed)
Patients last appt with PCP was on 07/29/2015 Last refill on hydrocodone was on 10/10/15  #30 No contract or UDS. Called the patient left a detailed message she would need to schedule an appointment with Dr. Abner Greenspan in order to continue getting refills.

## 2016-09-22 NOTE — Telephone Encounter (Signed)
error:[id number] ° °

## 2016-09-23 ENCOUNTER — Ambulatory Visit (INDEPENDENT_AMBULATORY_CARE_PROVIDER_SITE_OTHER): Payer: PRIVATE HEALTH INSURANCE | Admitting: Family Medicine

## 2016-09-23 ENCOUNTER — Encounter: Payer: Self-pay | Admitting: Family Medicine

## 2016-09-23 ENCOUNTER — Ambulatory Visit (HOSPITAL_BASED_OUTPATIENT_CLINIC_OR_DEPARTMENT_OTHER)
Admission: RE | Admit: 2016-09-23 | Discharge: 2016-09-23 | Disposition: A | Discharge status: Home / Self Care | Payer: PRIVATE HEALTH INSURANCE | Source: Ambulatory Visit | Attending: Family | Admitting: Family

## 2016-09-23 VITALS — BP 136/100 | HR 70 | Temp 98.1°F | Ht 67.0 in | Wt 153.4 lb

## 2016-09-23 DIAGNOSIS — I10 Essential (primary) hypertension: Secondary | ICD-10-CM | POA: Diagnosis not present

## 2016-09-23 DIAGNOSIS — Z1231 Encounter for screening mammogram for malignant neoplasm of breast: Secondary | ICD-10-CM | POA: Insufficient documentation

## 2016-09-23 DIAGNOSIS — Z0000 Encounter for general adult medical examination without abnormal findings: Secondary | ICD-10-CM

## 2016-09-23 DIAGNOSIS — G43809 Other migraine, not intractable, without status migrainosus: Secondary | ICD-10-CM | POA: Diagnosis not present

## 2016-09-23 MED ORDER — HYDROCODONE-ACETAMINOPHEN 5-325 MG PO TABS: 1.0000 | ORAL_TABLET | ORAL | 0 refills | Status: DC | PRN

## 2016-09-23 MED ORDER — OLMESARTAN-AMLODIPINE-HCTZ 20-5-12.5 MG PO TABS: 1.0000 | ORAL_TABLET | Freq: Every day | ORAL | 2 refills | Status: DC

## 2016-09-23 MED ORDER — METOPROLOL TARTRATE 50 MG PO TABS: 50.0000 mg | ORAL_TABLET | Freq: Two times a day (BID) | ORAL | 2 refills | Status: DC

## 2016-09-23 NOTE — Patient Instructions (Signed)
Keep checking your blood pressure at work. Write down the values and bring them to your next appt.

## 2016-09-23 NOTE — Progress Notes (Signed)
Chief Complaint  Patient presents with  . Follow-up    on BP and med refills-pt has only taking 1 BP med this am-out of the Metoprolol    Subjective Carla West is a 56 y.o. female who presents for hypertension follow up. She does monitor home blood pressures. She did not remember her log today. Blood pressures ranging from 150-160's/90's on average. Her BP has consistently come down since making the change and is still lowering. She is compliant with medications- metoprolol 50 mg BID, Tribenzor 20-5-12.5 mg daily. Patient has these side effects of medication: none She is adhering to a healthy diet overall. Current exercise: none   Past Medical History:  Diagnosis Date  . Abnormal Pap smear of cervix 08/03/2015  . Benign essential HTN 08/03/2015  . History of chicken pox   . Migraine headache 08/03/2015   Family History  Problem Relation Age of Onset  . Hypertension Mother   . Heart disease Mother   . Hyperlipidemia Father   . Hyperlipidemia Sister   . Other Brother     plan crash in [employer]  . Migraines Son   . Heart disease Brother     CHF, arrythmia  . Hypertension Brother   . Gout Brother   . Hypertension Brother    Medications Current Outpatient Prescriptions on File Prior to Visit  Medication Sig Dispense Refill  . metoprolol (LOPRESSOR) 50 MG tablet Take 1 tablet (50 mg total) by mouth 2 (two) times daily. 60 tablet 2  . Olmesartan-Amlodipine-HCTZ 20-5-12.5 MG TABS Take 1 tablet by mouth daily. 30 tablet 2   Allergies Allergies  Allergen Reactions  . Elavil [Amitriptyline Hcl]     Review of Systems Cardiovascular: no chest pain Respiratory:  no shortness of breath  Exam BP (!) 136/100 (BP Location: Left Arm, Patient Position: Sitting, Cuff Size: Normal)   Pulse 70   Temp 98.1 F (36.7 C) (Oral)   Ht  (1.702 m)   Wt 153 lb 6.4 oz (69.6 kg)   SpO2 97%   BMI 24.03 kg/m  General:  well developed, well nourished, in no apparent distress Skin:   warm, no pallor or diaphoresis Eyes:  pupils equal and round, sclera anicteric without injection Heart :RRR, no murmurs, no bruits, no LE edema Lungs:  clear to auscultation, no accessory muscle use Psych: well oriented with normal range of affect and appropriate judgment/insight  Essential hypertension - Plan: Olmesartan-Amlodipine-HCTZ 20-5-12.5 MG TABS, metoprolol (LOPRESSOR) 50 MG tablet  Other migraine without status migrainosus, not intractable - Plan: HYDROcodone-acetaminophen (NORCO/VICODIN) 5-325 MG tablet  Status: uncontrolled Orders as above. States reg pcp has been filling Norco for migraines, will refill for today and defer further refills to that provider.  Will keep regimen the same today. Offered to increase dose of olmesartan in the combo pill, but as she continues to decrease, will monitor home BP's and follow up in 4 weeks. Counseled on diet and exercise F/u in 1 mo with Melissa for BP check- pt to bring in home bp readings. The patient voiced understanding and agreement to the plan.  Jilda Roche [place], DO 09/23/16  7:55 AM

## 2016-09-23 NOTE — Progress Notes (Signed)
Pre visit review using our clinic review tool, if applicable. No additional management support is needed unless otherwise documented below in the visit note. 

## 2016-09-24 ENCOUNTER — Telehealth: Payer: Self-pay

## 2016-09-24 ENCOUNTER — Other Ambulatory Visit: Payer: Self-pay | Admitting: Family

## 2016-09-24 DIAGNOSIS — R928 Other abnormal and inconclusive findings on diagnostic imaging of breast: Secondary | ICD-10-CM

## 2016-09-24 NOTE — Telephone Encounter (Signed)
Called patient with Dr. Antoine Poche response. Asked patient to call back and schedle with her provider for 4 weeks and/or to schedule a nurse visit in 2-3 weeks.

## 2016-09-24 NOTE — Telephone Encounter (Signed)
I wanted her to f/u with reg PCP in 4 weeks after her visit with me. Alternative would be nurse visit in 2-3 weeks. I thought she needed some more time on her regimen to plateau before making changes. TY.

## 2016-09-24 NOTE — Telephone Encounter (Signed)
Patient no show for BP check  Scheduled for today.

## 2016-09-24 NOTE — Telephone Encounter (Signed)
See if she is willing to come in for a bp check next week

## 2016-09-24 NOTE — Telephone Encounter (Signed)
Patient states she did not realize she had appointment today for BP check. Wants to know when you want her to come back in to office for re-check.Please advise.

## 2016-10-02 ENCOUNTER — Ambulatory Visit (HOSPITAL_BASED_OUTPATIENT_CLINIC_OR_DEPARTMENT_OTHER): Payer: PRIVATE HEALTH INSURANCE

## 2016-10-05 ENCOUNTER — Other Ambulatory Visit: Payer: PRIVATE HEALTH INSURANCE

## 2016-10-28 ENCOUNTER — Ambulatory Visit
Admission: RE | Admit: 2016-10-28 | Discharge: 2016-10-28 | Disposition: A | Discharge status: Home / Self Care | Payer: PRIVATE HEALTH INSURANCE | Source: Ambulatory Visit | Attending: Family | Admitting: Family

## 2016-10-28 DIAGNOSIS — R928 Other abnormal and inconclusive findings on diagnostic imaging of breast: Secondary | ICD-10-CM

## 2016-10-29 ENCOUNTER — Encounter: Payer: PRIVATE HEALTH INSURANCE | Admitting: Gastroenterology

## 2016-12-28 ENCOUNTER — Telehealth: Payer: Self-pay | Admitting: Family Medicine

## 2016-12-28 NOTE — Telephone Encounter (Signed)
Patient does not want to wait that long to be seen, she will be seen by Ramon DredgeEdward

## 2016-12-28 NOTE — Telephone Encounter (Signed)
Patient called into schedule appointment for Thursday, she could only do Thursday since its her day off. She is having right arm pain and still having trouble with her BP being elevated. She was upset that every time she calls she is never able to get in with her PCP, Dr Abner GreenspanBlyth. Is looking into switching drs with someone else in our office. I did schedule acute visit with Esperanza RichtersEdward Saguier on Thursday 12/30/16, Dr Mariel AloeBlyth's schedule is held for reschedules for 01/06/17

## 2016-12-28 NOTE — Telephone Encounter (Signed)
She can reschedule for Thursday the 26 since that's her day off. Dr. Abner GreenspanBlyth is going on vacation and is trying to still give patients the chance to come in within the next 2 weeks.

## 2016-12-30 ENCOUNTER — Ambulatory Visit (HOSPITAL_BASED_OUTPATIENT_CLINIC_OR_DEPARTMENT_OTHER)
Admission: RE | Admit: 2016-12-30 | Discharge: 2016-12-30 | Disposition: A | Discharge status: Home / Self Care | Payer: PRIVATE HEALTH INSURANCE | Source: Ambulatory Visit | Attending: Medical | Admitting: Medical

## 2016-12-30 ENCOUNTER — Telehealth: Payer: Self-pay | Admitting: Medical

## 2016-12-30 ENCOUNTER — Ambulatory Visit (INDEPENDENT_AMBULATORY_CARE_PROVIDER_SITE_OTHER): Payer: PRIVATE HEALTH INSURANCE | Admitting: Medical

## 2016-12-30 ENCOUNTER — Encounter: Payer: Self-pay | Admitting: Medical

## 2016-12-30 VITALS — BP 174/99 | HR 64 | Temp 98.3°F | Resp 16 | Ht 67.0 in | Wt 154.6 lb

## 2016-12-30 DIAGNOSIS — M792 Neuralgia and neuritis, unspecified: Secondary | ICD-10-CM | POA: Diagnosis not present

## 2016-12-30 DIAGNOSIS — M8580 Other specified disorders of bone density and structure, unspecified site: Secondary | ICD-10-CM

## 2016-12-30 DIAGNOSIS — M542 Cervicalgia: Secondary | ICD-10-CM | POA: Insufficient documentation

## 2016-12-30 DIAGNOSIS — I10 Essential (primary) hypertension: Secondary | ICD-10-CM | POA: Diagnosis not present

## 2016-12-30 DIAGNOSIS — M25511 Pain in right shoulder: Secondary | ICD-10-CM | POA: Diagnosis not present

## 2016-12-30 LAB — COMPREHENSIVE METABOLIC PANEL
ALT: 58 U/L — ABNORMAL HIGH (ref 0–35)
AST: 42 U/L — ABNORMAL HIGH (ref 0–37)
Albumin: 3.9 g/dL (ref 3.5–5.2)
Alkaline Phosphatase: 77 U/L (ref 39–117)
BUN: 15 mg/dL (ref 6–23)
CO2: 26 mEq/L (ref 19–32)
Calcium: 9.1 mg/dL (ref 8.4–10.5)
Chloride: 106 mEq/L (ref 96–112)
Creatinine, Ser: 0.81 mg/dL (ref 0.40–1.20)
GFR: 94.78 mL/min (ref >60.00)
Glucose, Bld: 90 mg/dL (ref 70–99)
Potassium: 3.8 mEq/L (ref 3.5–5.1)
Sodium: 140 mEq/L (ref 135–145)
Total Bilirubin: 0.4 mg/dL (ref 0.2–1.2)
Total Protein: 6.8 g/dL (ref 6.0–8.3)

## 2016-12-30 MED ORDER — ALENDRONATE SODIUM 10 MG PO TABS: 10.0000 mg | ORAL_TABLET | Freq: Every day | ORAL | 11 refills | Status: DC

## 2016-12-30 MED ORDER — METOPROLOL TARTRATE 50 MG PO TABS: 50.0000 mg | ORAL_TABLET | Freq: Two times a day (BID) | ORAL | 2 refills | Status: DC

## 2016-12-30 MED ORDER — OLMESARTAN MEDOXOMIL-HCTZ 40-25 MG PO TABS: 1.0000 | ORAL_TABLET | Freq: Every day | ORAL | 0 refills | Status: DC

## 2016-12-30 MED ORDER — TRAMADOL HCL 50 MG PO TABS: 50.0000 mg | ORAL_TABLET | Freq: Four times a day (QID) | ORAL | 0 refills | Status: DC | PRN

## 2016-12-30 MED ORDER — TIZANIDINE HCL 4 MG PO CAPS: ORAL_CAPSULE | ORAL | 0 refills | Status: DC

## 2016-12-30 NOTE — Telephone Encounter (Signed)
dexa scan order placed. Vit d order placed and fosamax rx written.

## 2016-12-30 NOTE — Progress Notes (Signed)
Subjective:    Patient ID: Unknown Carla West, female    DOB: 08-02-1960, 56 y.o.   MRN: [id number]  HPI  Pt in for high blood pressure. It is always high. This is despite medications currently on. Pt saw specialist for her high blood pressure in the past and she thinks may need specialist again(but then states she is willing to try to control her if seen by same provider each time). Trend of bp here has been high. Pt checked her bp every day. She states bp 170/90 range most of the time. 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  Pt gets low level ha daily. But absense of acute gross motor or sensory function deficits.  Pt also has pain in her neck and her rt arm. Pain for about 2 weeks.Pain was first in her neck. She thought related to work bent over typing osition. But gradually worsening. Some pain radiates to rt shoulder and arm. Pt states fingers are getting tingling and numb at times.     Review of Systems  Constitutional: Negative for chills, fatigue and fever.  Respiratory: Negative for cough, chest tightness, shortness of breath and wheezing.   Cardiovascular: Negative for chest pain and palpitations.  Gastrointestinal: Negative for abdominal pain, diarrhea and nausea.  Musculoskeletal: Positive for neck pain. Negative for back pain and joint swelling.       Rt shoulder pain.  Neurological: Positive for headaches. Negative for dizziness, seizures, syncope, speech difficulty and light-headedness.       Low level head ache.  Hematological: Negative for adenopathy. Does not bruise/bleed easily.  Psychiatric/Behavioral: Negative for confusion, dysphoric mood, sleep disturbance and suicidal ideas. The patient is not nervous/anxious.     Past Medical History:  Diagnosis Date  . Abnormal Pap smear of cervix 08/03/2015  . Benign essential HTN 08/03/2015  . History of chicken pox   . Migraine headache 08/03/2015     Social History   Social History  . Marital status: Divorced    Spouse name:  N/A  . Number of children: N/A  . Years of education: N/A   Occupational History  . Not on file.   Social History Main Topics  . Smoking status: Never Smoker  . Smokeless tobacco: Never Used  . Alcohol use No  . Drug use: No  . 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 Not on file   Social History Narrative  . No narrative on file    Past Surgical History:  Procedure Laterality Date  . TUBAL LIGATION      Family History  Problem Relation Age of Onset  . Hypertension Mother   . Heart disease Mother   . Hyperlipidemia Father   . Hyperlipidemia Sister   . Other Brother        plan crash in [employer]  . Migraines Son   . Heart disease Brother        CHF, arrythmia  . Hypertension Brother   . Gout Brother   . Hypertension Brother     Allergies  Allergen Reactions  . Elavil [Amitriptyline Hcl]     Current Outpatient Prescriptions on File Prior to Visit  Medication Sig Dispense Refill  . HYDROcodone-acetaminophen (NORCO/VICODIN) 5-325 MG tablet Take 1-2 tablets by mouth every 4 (four) hours as needed for moderate pain or severe pain. 30 tablet 0   No current facility-administered medications on file prior  to visit.     BP (!) 174/99   Pulse 64   Temp 98.3 F (36.8 C) (Oral)   Resp 16   Ht 5\' 7"  (1.702 m)   Wt 154 lb 9.6 oz (70.1 kg)   SpO2 100%   BMI 24.21 kg/m       Objective:   Physical Exam   General Mental Status- Alert. General Appearance- Not in acute distress.   Skin General: Color- Normal Color. Moisture- Normal Moisture.  Neck Carotid Arteries- Normal color. Moisture- Normal Moisture. No carotid bruits. No JVD. Mild rt trapezius. No mid c-spine tenderness.  Chest and Lung Exam Auscultation: Breath Sounds:-Normal.  Cardiovascular Auscultation:Rythm- Regular. Murmurs & Other Heart Sounds:Auscultation of the heart reveals- No  Murmurs.  Abdomen Inspection:-Inspeection Normal. Palpation/Percussion:Note:No mass. Palpation and Percussion of the abdomen reveal- Non Tender, Non Distended + BS, no rebound or guarding.    Neurologic Cranial Nerve exam:- CN III-XII intact(No nystagmus), symmetric smile. Drift Test:- No drift. Finger to Nose:- Normal/Intact Strength:- 5/5 equal and symmetric strength both upper and lower extremities. Rt hand- sharp and dull discrimination intact.   Rt shoulder- mild pain on range of motion. No crepitus.  Rt upper ext- good grip strength. Normal flexion and extension of elbow      Assessment & Plan:  Your bp is high today as it has been in the past. Increasing your current bp medication both components of omesartan/hctz . And refilled your metoprolol.  BP should start to come down. Check at your work and please follow up in 2 weeks. Make sure eating potassium rich diet. Get cmp lab today.  For neck, shoulder and radiating pain down your arm will get xray of neck and rt shoulder. Rx tinazadine to use at night. Also limited rx of tramadol.  If you get HA or high blood pressure with signs and symptoms as discussed then ED evaluation.  Follow up in 2 wks or as needed  Pt declined referral to specialist presently.  Adonna Horsley, Ramon Dredge, PA-C

## 2016-12-30 NOTE — Patient Instructions (Addendum)
Your bp is high today as it has been in the past. Increasing your current bp medication both components of omesartan/hctz . And refilled your metoprolol.  BP should start to come down. Check at your work and please follow up in 2 weeks. Make sure eating potassium rich diet. Get cmp lab today.  For neck, shoulder and radiating pain down your arm will get xray of neck and rt shoulder. Rx tinazidine to use at night. Also limited rx of tramadol.  If you get HA or high blood pressure with signs and symptoms as discussed then ED evaluation.  Follow up in 2 wks or as needed

## 2016-12-31 ENCOUNTER — Telehealth: Payer: Self-pay | Admitting: Medical

## 2016-12-31 ENCOUNTER — Telehealth: Payer: Self-pay | Admitting: Family Medicine

## 2016-12-31 DIAGNOSIS — M25511 Pain in right shoulder: Secondary | ICD-10-CM

## 2016-12-31 NOTE — Telephone Encounter (Signed)
°  Relation to ZO:XWRUpt:self Call back number: [phone]    Reason for call:  Patient calling back checking on the status of imaging results stating she's in a lot of pain and does not want to go thru the weekend in discomfort, please advise best # [phone]

## 2016-12-31 NOTE — Telephone Encounter (Signed)
Would you please try to get pt in with Dr. Pearletha ForgeHudnall by Tuesday or Wednesday. See Ashlee last note/conversation with pt. Referral already place.

## 2016-12-31 NOTE — Telephone Encounter (Signed)
Pt called in to request results.    CB: [phone]

## 2016-12-31 NOTE — Telephone Encounter (Addendum)
Called patient back.  She was rush for time, stating that she has a conference call at 4:30pm and could not talk long.  Pt states that medication is not helping. She tried it last night for the first time and it didn't do anything.  She still can't raise her arm above her head and states "there has got to be something wrong."  Pt is agreeable to sport medicine referral.  Referral ordered.

## 2017-01-05 ENCOUNTER — Telehealth: Payer: Self-pay | Admitting: Medical

## 2017-01-05 DIAGNOSIS — R748 Abnormal levels of other serum enzymes: Secondary | ICD-10-CM

## 2017-01-05 NOTE — Telephone Encounter (Signed)
Notified pt of lab results. Pt is concerned about liver enzyme elevation. Pt states she does not drank alcohol. Pt wants to know should she coming in before the 3 months to recheck liver enzymes.

## 2017-01-05 NOTE — Telephone Encounter (Signed)
Her liver enzyme elevation is more on mild/ minimum elevated side. Can repeat in 2 weeks if she wants. The other thought with mild liver enzyme elevation is she may have some mild fat in liver which is quite common. Will put future cmp in. May get abdomen US/assess for fatty liver in future if mild elevation persists or if worsens.

## 2017-01-05 NOTE — Telephone Encounter (Signed)
Future cmp placed. 

## 2017-01-06 ENCOUNTER — Ambulatory Visit: Payer: PRIVATE HEALTH INSURANCE | Admitting: Family Medicine

## 2017-01-11 NOTE — Telephone Encounter (Signed)
Pt notified. Pt scheduled lab appointment 01/18/17

## 2017-01-12 ENCOUNTER — Ambulatory Visit: Payer: PRIVATE HEALTH INSURANCE | Admitting: Medical

## 2017-01-12 DIAGNOSIS — Z0289 Encounter for other administrative examinations: Secondary | ICD-10-CM

## 2017-01-18 ENCOUNTER — Other Ambulatory Visit: Payer: PRIVATE HEALTH INSURANCE

## 2017-06-12 ENCOUNTER — Encounter (HOSPITAL_COMMUNITY): Payer: Self-pay | Admitting: Emergency Medicine

## 2017-06-12 ENCOUNTER — Other Ambulatory Visit: Payer: Self-pay

## 2017-06-12 ENCOUNTER — Emergency Department (HOSPITAL_COMMUNITY)
Admission: EM | Admit: 2017-06-12 | Discharge: 2017-06-12 | Disposition: A | Discharge status: Home / Self Care | Payer: PRIVATE HEALTH INSURANCE | Attending: Emergency Medicine | Admitting: Emergency Medicine

## 2017-06-12 ENCOUNTER — Emergency Department (HOSPITAL_COMMUNITY): Payer: PRIVATE HEALTH INSURANCE

## 2017-06-12 DIAGNOSIS — Z79899 Other long term (current) drug therapy: Secondary | ICD-10-CM | POA: Diagnosis not present

## 2017-06-12 DIAGNOSIS — I160 Hypertensive urgency: Secondary | ICD-10-CM | POA: Diagnosis not present

## 2017-06-12 DIAGNOSIS — I10 Essential (primary) hypertension: Secondary | ICD-10-CM | POA: Insufficient documentation

## 2017-06-12 DIAGNOSIS — R51 Headache: Secondary | ICD-10-CM | POA: Diagnosis present

## 2017-06-12 DIAGNOSIS — R519 Headache, unspecified: Secondary | ICD-10-CM

## 2017-06-12 LAB — CBC
HCT: 38.4 % (ref 36.0–46.0)
Hemoglobin: 12.5 g/dL (ref 12.0–15.0)
MCH: 27.8 pg (ref 26.0–34.0)
MCHC: 32.6 g/dL (ref 30.0–36.0)
MCV: 85.5 fL (ref 78.0–100.0)
Platelets: 247 10*3/uL (ref 150–400)
RBC: 4.49 MIL/uL (ref 3.87–5.11)
RDW: 14 % (ref 11.5–15.5)
WBC: 5.1 10*3/uL (ref 4.0–10.5)

## 2017-06-12 LAB — BASIC METABOLIC PANEL
Anion gap: 10 (ref 5–15)
BUN: 15 mg/dL (ref 6–20)
CO2: 24 mmol/L (ref 22–32)
Calcium: 9.1 mg/dL (ref 8.9–10.3)
Chloride: 104 mmol/L (ref 101–111)
Creatinine, Ser: 0.76 mg/dL (ref 0.44–1.00)
GFR calc Af Amer: >60 mL/min (ref >60)
GFR calc non Af Amer: >60 mL/min (ref >60)
Glucose, Bld: 100 mg/dL — ABNORMAL HIGH (ref 65–99)
Potassium: 3.6 mmol/L (ref 3.5–5.1)
Sodium: 138 mmol/L (ref 135–145)

## 2017-06-12 LAB — I-STAT TROPONIN, ED
Troponin i, poc: 0 ng/mL (ref 0.00–0.08)
Troponin i, poc: 0 ng/mL (ref 0.00–0.08)

## 2017-06-12 LAB — I-STAT BETA HCG BLOOD, ED (MC, WL, AP ONLY): I-stat hCG, quantitative: <5 m[IU]/mL (ref <5)

## 2017-06-12 LAB — CBG MONITORING, ED: Glucose-Capillary: 93 mg/dL (ref 65–99)

## 2017-06-12 MED ORDER — PROCHLORPERAZINE EDISYLATE 5 MG/ML IJ SOLN
10.0000 mg | Freq: Once | INTRAMUSCULAR | Status: AC
  Administered 2017-06-12: 10 mg via INTRAVENOUS
  Filled 2017-06-12: qty 2

## 2017-06-12 MED ORDER — OXYCODONE-ACETAMINOPHEN 5-325 MG PO TABS
1.0000 | ORAL_TABLET | Freq: Once | ORAL | Status: AC
  Administered 2017-06-12: 1 via ORAL
  Filled 2017-06-12: qty 1

## 2017-06-12 MED ORDER — KETOROLAC TROMETHAMINE 15 MG/ML IJ SOLN
15.0000 mg | Freq: Once | INTRAMUSCULAR | Status: AC
  Administered 2017-06-12: 15 mg via INTRAVENOUS
  Filled 2017-06-12: qty 1

## 2017-06-12 MED ORDER — DEXAMETHASONE SODIUM PHOSPHATE 10 MG/ML IJ SOLN
10.0000 mg | Freq: Once | INTRAMUSCULAR | Status: AC
  Administered 2017-06-12: 10 mg via INTRAVENOUS
  Filled 2017-06-12: qty 1

## 2017-06-12 MED ORDER — DIPHENHYDRAMINE HCL 50 MG/ML IJ SOLN
25.0000 mg | Freq: Once | INTRAMUSCULAR | Status: AC
  Administered 2017-06-12: 25 mg via INTRAVENOUS
  Filled 2017-06-12: qty 1

## 2017-06-12 MED ORDER — SODIUM CHLORIDE 0.9 % IV BOLUS (SEPSIS)
1000.0000 mL | Freq: Once | INTRAVENOUS | Status: AC
  Administered 2017-06-12: 1000 mL via INTRAVENOUS

## 2017-06-12 NOTE — ED Triage Notes (Signed)
Pt. Stated, I started having a migraine headache this morning with some SOB and left side pain,. Neg. VAN. Equal gfrips , no arm drift. symmetrical smile. Pt. Aler and oriented x 4.

## 2017-06-12 NOTE — ED Notes (Signed)
PIV attempt x 1 unsuccessful left AC 

## 2017-06-12 NOTE — ED Provider Notes (Signed)
[facility] EMERGENCY DEPARTMENT Provider Note   CSN: [id number] Arrival date & time: 06/12/17  8295     History   Chief Complaint Chief Complaint  Patient presents with  . Headache  . Migraine  . Emesis  . Shortness of Breath    HPI Carla West is a 56 y.o. female.  HPI  56 year old female presents with a chief complaint of a headache and hypertension.  She started noticing a headache last night and knew that she was getting a migraine.  At around 4 AM she woke up and took her blood pressure medicine because her headache was a little bit worse.  When she woke up again the headache was even worse and has progressively worsened.  Was up to a 9/10 in the waiting room but she was given a Percocet and now the pain is down to an 8/10.  The pain as a pounding sensation.  It is left-sided.  She will get migraines that are either right or left-sided so this is not too atypical.  She has some photophobia and mild spots in her vision but this also happens with headaches.  She has vomited once and has nausea.  She checked her blood pressure and it was over 200 systolic.  She states this has happened to her multiple times before and often when she has to come to the ED she will have both the headache and blood pressure.  Around the time that she vomited she had brief chest pain and shortness of breath but that has resolved.  Had some left arm pain but that also is gone.  She denies any focal weakness or numbness.  No fevers or neck pain/stiffness.  The headache is more severe than her typical migraines but otherwise feels exactly like other migraines.   Past Medical History:  Diagnosis Date  . Abnormal Pap smear of cervix 08/03/2015  . Benign essential HTN 08/03/2015  . History of chicken pox   . Migraine headache 08/03/2015    Patient Active Problem List   Diagnosis Date Noted  . Essential hypertension 08/03/2015  . Migraine headache 08/03/2015  . Abnormal Pap smear of  cervix 08/03/2015  . History of chicken pox     Past Surgical History:  Procedure Laterality Date  . TUBAL LIGATION      OB History    No data available       Home Medications    Prior to Admission medications   Medication Sig Start Date End Date Taking? Authorizing Provider  alendronate (FOSAMAX) 10 MG tablet Take 1 tablet (10 mg total) by mouth daily before breakfast. Take with a full glass of water on an empty stomach. 12/30/16   Saguier, Ramon Dredge, PA-C  HYDROcodone-acetaminophen (NORCO/VICODIN) 5-325 MG tablet Take 1-2 tablets by mouth every 4 (four) hours as needed for moderate pain or severe pain. 09/23/16   Sharlene Dory, DO  metoprolol tartrate (LOPRESSOR) 50 MG tablet Take 1 tablet (50 mg total) by mouth 2 (two) times daily. 12/30/16   Saguier, Ramon Dredge, PA-C  olmesartan-hydrochlorothiazide (BENICAR HCT) 40-25 MG tablet Take 1 tablet by mouth daily. 12/30/16   Saguier, Ramon Dredge, PA-C  tiZANidine (ZANAFLEX) 4 MG capsule 1 tab po q hs 12/30/16   Saguier, Ramon Dredge, PA-C  traMADol (ULTRAM) 50 MG tablet Take 1 tablet (50 mg total) by mouth every 6 (six) hours as needed. 12/30/16   Saguier, Ramon Dredge, PA-C    Family History Family History  Problem Relation Age of Onset  .  Hypertension Mother   . Heart disease Mother   . Hyperlipidemia Father   . Hyperlipidemia Sister   . Other Brother        plan crash [place] [employer]  . Migraines Son   . Heart disease Brother        CHF, arrythmia  . Hypertension Brother   . Gout Brother   . Hypertension Brother     Social History Social History   Tobacco Use  . Smoking status: Never Smoker  . Smokeless tobacco: Never Used  Substance Use Topics  . Alcohol use: No  . Drug use: No     Allergies   Elavil [amitriptyline hcl]   Review of Systems Review of Systems  Constitutional: Negative for fever.  Eyes: Positive for photophobia and visual disturbance.  Respiratory: Positive for shortness of breath.   Cardiovascular: Positive  for chest pain.  Gastrointestinal: Positive for nausea and vomiting. Negative for abdominal pain.  Musculoskeletal: Negative for neck pain.  Neurological: Positive for headaches. Negative for weakness and numbness.  All other systems reviewed and are negative.    Physical Exam Updated Vital Signs BP (!) 207/116 (BP Location: Right Arm)   Pulse 61   Temp 99.1 F (37.3 C)   Resp 18   Ht 5\' 7"  (1.702 m)   Wt 72.6 kg (160 lb)   SpO2 100%   BMI 25.06 kg/m   Physical Exam  Constitutional: She is oriented to person, place, and time. She appears well-developed and well-nourished.  Non-toxic appearance. She does not appear ill. No distress.  HENT:  Head: Normocephalic and atraumatic.  Right Ear: External ear normal.  Left Ear: External ear normal.  Nose: Nose normal.  Eyes: EOM are normal. Pupils are equal, round, and reactive to light. Right eye exhibits no discharge. Left eye exhibits no discharge.  Neck: Normal range of motion. Neck supple.  Cardiovascular: Normal rate, regular rhythm and normal heart sounds.  Pulmonary/Chest: Effort normal and breath sounds normal.  Abdominal: Soft. There is no tenderness.  Neurological: She is alert and oriented to person, place, and time.  CN 3-12 grossly intact. 5/5 strength in all 4 extremities. Grossly normal sensation. Normal finger to nose.   Skin: Skin is warm and dry.  Nursing note and vitals reviewed.    ED Treatments / Results  Labs (all labs ordered are listed, but only abnormal results are displayed) Labs Reviewed  BASIC METABOLIC PANEL - Abnormal; Notable for the following components:      Result Value   Glucose, Bld 100 (*)    All other components within normal limits  CBC  CBG MONITORING, ED  I-STAT TROPONIN, ED  I-STAT BETA HCG BLOOD, ED ([facility], [facility], [facility] ONLY)  I-STAT TROPONIN, ED    EKG  EKG Interpretation  Date/Time:  Sunday June 12 2017 10:06:22 EST Ventricular Rate:  57 PR Interval:  206 QRS Duration: 86 QT  Interval:  410 QTC Calculation: 399 R Axis:   74 Text Interpretation:  Sinus bradycardia Minimal voltage criteria for LVH, may be normal variant Borderline ECG Confirmed by Pricilla LovelessGoldston, Salli Bodin [phone]([id number]) on 06/12/2017 10:59:40 AM       EKG Interpretation  Date/Time:  Sunday June 12 2017 12:57:57 EST Ventricular Rate:  73 PR Interval:  206 QRS Duration: 82 QT Interval:  409 QTC Calculation: 451 R Axis:   71 Text Interpretation:  Sinus rhythm Prolonged PR interval Consider left ventricular hypertrophy no significant change since earlier in the day Confirmed by Pricilla LovelessGoldston, Greidys Deland (  [id number]) on 06/12/2017 1:00:28 PM        Radiology Dg Chest 2 View  Result Date: 06/12/2017 CLINICAL DATA:  Shortness of breath EXAM: CHEST  2 VIEW COMPARISON:  06/28/2016 FINDINGS: The heart size and mediastinal contours are within normal limits. Both lungs are clear. The visualized skeletal structures are unremarkable. IMPRESSION: No active cardiopulmonary disease. Electronically Signed   By: Signa Kellaylor  Stroud M.D.   On: 06/12/2017 10:42    Procedures Procedures (including critical care time)  Medications Ordered in ED Medications  oxyCODONE-acetaminophen (PERCOCET/ROXICET) 5-325 MG per tablet 1 tablet (1 tablet Oral Given 06/12/17 1045)  sodium chloride 0.9 % bolus 1,000 mL (0 mLs Intravenous Stopped 06/12/17 1400)  prochlorperazine (COMPAZINE) injection 10 mg (10 mg Intravenous Given 06/12/17 1139)  diphenhydrAMINE (BENADRYL) injection 25 mg (25 mg Intravenous Given 06/12/17 1139)  ketorolac (TORADOL) 15 MG/ML injection 15 mg (15 mg Intravenous Given 06/12/17 1139)  dexamethasone (DECADRON) injection 10 mg (10 mg Intravenous Given 06/12/17 1140)     Initial Impression / Assessment and Plan / ED Course  I have reviewed the triage vital signs and the nursing notes.  Pertinent labs & imaging results that were available during my care of the patient were reviewed by me and considered in my medical decision  making (see chart for details).     Patient's headache has resolved with treatment.  Her blood pressure is still elevated although mildly improved.  She frequently is here with elevated blood pressure and this is a long-term issue for her.  Given that her headache is resolved and she is still hypertensive, I do not think the hypertension is what was causing the symptoms.  Offered to help adjust some of her blood pressure medicines but she prefers to follow-up closely with her PCP for this.  She had brief and atypical chest pain early this morning, her ECG is unchanged and she has had 2- troponins.  I doubt PE, ACS, or dissection.  This appears to be an otherwise recurrent migraine for her.  Highly doubt acute CNS emergency such as aneurysm or infection.  Follow-up with PCP.  Discussed return precautions.  Final Clinical Impressions(s) / ED Diagnoses   Final diagnoses:  Left-sided headache  Hypertensive urgency    ED Discharge Orders    None       Pricilla LovelessGoldston, Aizik Reh, MD 06/12/17 1714

## 2017-06-15 ENCOUNTER — Encounter: Payer: Self-pay | Admitting: Medical

## 2017-06-15 ENCOUNTER — Ambulatory Visit (INDEPENDENT_AMBULATORY_CARE_PROVIDER_SITE_OTHER): Payer: Managed Care, Other (non HMO) | Admitting: Medical

## 2017-06-15 VITALS — BP 180/110 | HR 100 | Temp 98.2°F | Resp 16 | Ht 67.0 in | Wt 156.0 lb

## 2017-06-15 DIAGNOSIS — G43809 Other migraine, not intractable, without status migrainosus: Secondary | ICD-10-CM | POA: Diagnosis not present

## 2017-06-15 DIAGNOSIS — I10 Essential (primary) hypertension: Secondary | ICD-10-CM

## 2017-06-15 MED ORDER — ZOLMITRIPTAN 2.5 MG PO TABS: 2.5000 mg | ORAL_TABLET | Freq: Once | ORAL | 0 refills | Status: DC

## 2017-06-15 MED ORDER — TIZANIDINE HCL 4 MG PO CAPS: ORAL_CAPSULE | ORAL | 0 refills | Status: DC

## 2017-06-15 MED ORDER — BUTALBITAL-APAP-CAFFEINE 50-325-40 MG PO TABS: 1.0000 | ORAL_TABLET | Freq: Four times a day (QID) | ORAL | 0 refills | Status: DC | PRN

## 2017-06-15 MED ORDER — AMLODIPINE BESYLATE 10 MG PO TABS: 10.0000 mg | ORAL_TABLET | Freq: Every day | ORAL | 3 refills | Status: DC

## 2017-06-15 NOTE — Progress Notes (Signed)
Subjective:    Patient ID: Carla West, female    DOB: 1960-09-01, 57 y.o.   MRN: [id number]  HPI  Pt in for follow up.  Pt went to ED. She had migraine ha likely per her reort and has very high blood pressure. Pt given toradol and compazine and ha stopped. Often times she will get light sensitive ha with nausea. Seen in ED on 06-12-2017. She declined med to bring bp down in ED but chose to follow up here.  Ct of head in 2017 was negative.  Pt states in past when lived in [place] she had neurologist and cardiologist. Pt states wants to see cardiologist first. Pt wants to see Dr. Milas Kocher. Pt states in past one neurologist thought maybe amlodipine may have cause some of her migraine HA. She had stopped and it did not have impact on her ha's. Still had with same frequency and intensity in the past.   Pt in past gets light sensitive ha with some nausea. In past pt tried some imitrex but then that was stopped after 3 years.  Pt never got hydrocodone filled recently. Some mild left trapezius pain at times.      Review of Systems  Constitutional: Negative for chills and fatigue.  HENT: Negative for congestion, postnasal drip, rhinorrhea, sinus pressure and sinus pain.   Respiratory: Negative for chest tightness.   Cardiovascular: Negative for chest pain and palpitations.  Gastrointestinal: Negative for abdominal pain.  Musculoskeletal: Negative for back pain, neck pain and neck stiffness.       Left trapezius chronically tight and tender.  Skin: Negative for color change, rash and wound.  Neurological:       Very mild low level chronic headaches daily. Very low level presenlty. Nothing like the other day in ED.  Psychiatric/Behavioral: Negative for agitation, decreased concentration, hallucinations, sleep disturbance and suicidal ideas.       Does not explicitly states that she is stressed from work but during exam received various calls from her work and this appears to be  a bit  stressful.    Past Medical History:  Diagnosis Date  . Abnormal Pap smear of cervix 08/03/2015  . Benign essential HTN 08/03/2015  . History of chicken pox   . Migraine headache 08/03/2015     Social History   Socioeconomic History  . Marital status: Single    Spouse name: Not on file  . Number of children: Not on file  . Years of education: Not on file  . Highest education level: Not on file  Social Needs  . Financial resource strain: Not on file  . Food insecurity - worry: Not on file  . Food insecurity - inability: Not on file  . Transportation needs - medical: Not on file  . Transportation needs - non-medical: Not on file  Occupational History  . Not on file  Tobacco Use  . Smoking status: Never Smoker  . Smokeless tobacco: Never Used  Substance and Sexual Activity  . Alcohol use: No  . Drug use: No  . 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 Not on file  Social History Narrative  . Not on file    Past Surgical History:  Procedure Laterality Date  . TUBAL LIGATION      Family History  Problem Relation Age of Onset  . Hypertension Mother   . Heart disease Mother   .  Hyperlipidemia Father   . Hyperlipidemia Sister   . Other Brother        plan crash [place] [employer]  . Migraines Son   . Heart disease Brother        CHF, arrythmia  . Hypertension Brother   . Gout Brother   . Hypertension Brother     Allergies  Allergen Reactions  . Elavil [Amitriptyline Hcl]     Current Outpatient Medications on File Prior to Visit  Medication Sig Dispense Refill  . alendronate (FOSAMAX) 10 MG tablet Take 1 tablet (10 mg total) by mouth daily before breakfast. Take with a full glass of water on an empty stomach. 30 tablet 11  . metoprolol tartrate (LOPRESSOR) 50 MG tablet Take 1 tablet (50 mg total) by mouth 2 (two) times daily. 60 tablet 2  . olmesartan-hydrochlorothiazide  (BENICAR HCT) 40-25 MG tablet Take 1 tablet by mouth daily. 30 tablet 0   No current facility-administered medications on file prior to visit.     BP (!) 180/110   Pulse 100   Temp 98.2 F (36.8 C) (Oral)   Resp 16   Ht 5\' 7"  (1.702 m)   Wt 156 lb (70.8 kg)   SpO2 100%   BMI 24.43 kg/m       Objective:   Physical Exam  General Mental Status- Alert. General Appearance- Not in acute distress.   Skin General: Color- Normal Color. Moisture- Normal Moisture.  Neck Carotid Arteries- Normal color. Moisture- Normal Moisture. No carotid bruits. No JVD.  Left trapezius mild tender to palpation.  No neck stiffness.  Chest and Lung Exam Auscultation: Breath Sounds:-Normal.  Cardiovascular Auscultation:Rythm- Regular. Murmurs & Other Heart Sounds:Auscultation of the heart reveals- No Murmurs.  Abdomen Inspection:-Inspeection Normal. Palpation/Percussion:Note:No mass. Palpation and Percussion of the abdomen reveal- Non Tender, Non Distended + BS, no rebound or guarding.    Neurologic Cranial Nerve exam:- CN III-XII intact(No nystagmus), symmetric smile. Drift Test:- No drift. Romberg Exam:- Negative.  Heal to Toe Gait exam:-Normal. Finger to Nose:- Normal/Intact Strength:- 5/5 equal and symmetric strength both upper and lower extremities.      Assessment & Plan:  For your history of very high blood pressure, I want you to continue with the Benicar/HCTZ and Lopressor.  I do think amlodipine is a good option to reduce  blood pressure and in some cases it can be used for prophylaxis for migraines.  However in some persons report side effect of headache.  In the past when you stopped amlodipine per prior neurologist instruction it did not impact frequency of your headaches.  You could discuss this with our pharmacist downstairs.  Also considering possible clonidine but this would not be preferred first-line treatment.  Some potential side effects.  I did go ahead and made  referral to cardiologist.  For your migraine headache history, I did send Zomig to your pharmacy.  If this is to use early onset migraine type headaches.  Also did prescribe you Zanaflex to use for tension type headache at night.  Also made Fioricet available for backup severe headache.  However please do not use muscle relaxant and Fioricet at the same time.  I went ahead and made referral to neurologist as well.   If you have any headache with gross motor or sensory function deficits then be seen at the emergency department.  Follow-up in 7-10 days or as needed.  Modestine Scherzinger, Ramon DredgeEdward, PA-C

## 2017-06-15 NOTE — Patient Instructions (Addendum)
For your history of very high blood pressure, I want you to continue with the Benicar/HCTZ and Lopressor.  I do think amlodipine is a good option to reduce  blood pressure and in some cases it can be used for prophylaxis for migraines.  However in some persons report side effect of headache.  In the past when you stopped amlodipine per prior neurologist instruction it did not impact frequency of your headaches.  You could discuss this with our pharmacist downstairs.  Also considering possible clonidine but this would not be preferred first-line treatment.  Some potential side effects.  I did go ahead and made referral to cardiologist.  For your migraine headache history, I did send Zomig to your pharmacy.  If this is to use early onset migraine type headaches.  Also did prescribe you Zanaflex to use for tension type headache at night.  Also made Fioricet available for backup severe headache.  However please do not use muscle relaxant and Fioricet at the same time.  I went ahead and made referral to neurologist as well.   If you have any headache with gross motor or sensory function deficits then be seen at the emergency department.  Follow-up in 7-10 days or as needed.

## 2017-07-14 ENCOUNTER — Telehealth: Payer: Self-pay | Admitting: Family Medicine

## 2017-07-14 ENCOUNTER — Telehealth: Payer: Self-pay | Admitting: Medical

## 2017-07-14 MED ORDER — TRAZODONE HCL 50 MG PO TABS: 25.0000 mg | ORAL_TABLET | Freq: Every evening | ORAL | 3 refills | Status: DC | PRN

## 2017-07-14 MED ORDER — ALENDRONATE SODIUM 10 MG PO TABS: 10.0000 mg | ORAL_TABLET | Freq: Every day | ORAL | 11 refills | Status: DC

## 2017-07-14 NOTE — Telephone Encounter (Signed)
Copied from CRM [id number]. Topic: Quick Communication - See Telephone Encounter >> Jul 14, 2017  3:16 PM Diana EvesHoyt, Carla West wrote: CRM for notification. See Telephone encounter for:  Pt wants to review her meds with nurse before she picks them up. She doesn't want to take the wrong things.  07/14/17.

## 2017-07-14 NOTE — Telephone Encounter (Signed)
  Will you cancel pt fosamax sent to our pharmacy.  I am sending in trazadone to her pharmacy for insomnia. Also asked pt to check bp daily and my chart me in one week with readings.  Thanks,

## 2017-07-14 NOTE — Telephone Encounter (Signed)
Patient has seen Ramon DredgeEdward for most of her issues   Please advise

## 2017-07-14 NOTE — Telephone Encounter (Signed)
I called patient.  Please see phone note.

## 2017-08-24 ENCOUNTER — Ambulatory Visit: Payer: Managed Care, Other (non HMO) | Admitting: Cardiovascular Disease

## 2017-08-24 NOTE — Progress Notes (Deleted)
Cardiology Office Note   Date:  08/24/2017   ID:  Unknown Carla Gruetzmacher, DOB 1960/07/05, MRN [id number]  PCP:  Bradd CanaryBlyth, Stacey A, MD  Cardiologist:   Chilton Siiffany Weston, MD   No chief complaint on file.     History of Present Illness: Unknown Carla West is a 57 y.o. female with hypertension who is being seen today for the evaluation of *** at the request of Marisue BrooklynSaguier, Edward, PA-C.  ED 05/2017 SOB, BP >200   Past Medical History:  Diagnosis Date  . Abnormal Pap smear of cervix 08/03/2015  . Benign essential HTN 08/03/2015  . History of chicken pox   . Migraine headache 08/03/2015    Past Surgical History:  Procedure Laterality Date  . TUBAL LIGATION       Current Outpatient Medications  Medication Sig Dispense Refill  . alendronate (FOSAMAX) 10 MG tablet Take 1 tablet (10 mg total) by mouth daily before breakfast. Take with a full glass of water on an empty stomach. 30 tablet 11  . amLODipine (NORVASC) 10 MG tablet Take 1 tablet (10 mg total) by mouth daily. 30 tablet 3  . butalbital-acetaminophen-caffeine (FIORICET, ESGIC) 50-325-40 MG tablet Take 1 tablet by mouth every 6 (six) hours as needed for headache. 16 tablet 0  . metoprolol tartrate (LOPRESSOR) 50 MG tablet Take 1 tablet (50 mg total) by mouth 2 (two) times daily. 60 tablet 2  . olmesartan-hydrochlorothiazide (BENICAR HCT) 40-25 MG tablet Take 1 tablet by mouth daily. 30 tablet 0  . tiZANidine (ZANAFLEX) 4 MG capsule 1 tab po q hs 10 capsule 0  . traZODone (DESYREL) 50 MG tablet Take 0.5-1 tablets (25-50 mg total) by mouth at bedtime as needed for sleep. 30 tablet 3  . ZOLMitriptan (ZOMIG) 2.5 MG tablet Take 1 tablet (2.5 mg total) by mouth once for 1 dose. May repeat in 2 hours if headache persists or recurs. 10 tablet 0   No current facility-administered medications for this visit.     Allergies:   Elavil [amitriptyline hcl]    Social History:  The patient  reports that  has never smoked. she has never used smokeless  tobacco. She reports that she does not drink alcohol or use drugs.   Family History:  The patient's ***family history includes Gout in her brother; Heart disease in her brother and mother; Hyperlipidemia in her father and sister; Hypertension in her brother, brother, and mother; Migraines in her son; Other in her brother.    ROS:  Please see the history of present illness.   Otherwise, review of systems are positive for {NONE DEFAULTED:18576::"none"}.   All other systems are reviewed and negative.    PHYSICAL EXAM: VS:  There were no vitals taken for this visit. , BMI There is no height or weight on file to calculate BMI. GENERAL:  Well appearing HEENT:  Pupils equal round and reactive, fundi not visualized, oral mucosa unremarkable NECK:  No jugular venous distention, waveform within normal limits, carotid upstroke brisk and symmetric, no bruits, no thyromegaly LYMPHATICS:  No cervical adenopathy LUNGS:  Clear to auscultation bilaterally HEART:  RRR.  PMI not displaced or sustained,S1 and S2 within normal limits, no S3, no S4, no clicks, no rubs, *** murmurs ABD:  Flat, positive bowel sounds normal in frequency in pitch, no bruits, no rebound, no guarding, no midline pulsatile mass, no hepatomegaly, no splenomegaly EXT:  2 plus pulses throughout, no edema, no cyanosis no clubbing SKIN:  No rashes no nodules NEURO:  Cranial  nerves II through XII grossly intact, motor grossly intact throughout [facility]:  Cognitively intact, oriented to person place and time    EKG:  EKG {ACTION; IS/IS AOZ:[id number]} ordered today. The ekg ordered today demonstrates ***   Recent Labs: 12/30/2016: ALT 58 06/12/2017: BUN 15; Creatinine, Ser 0.76; Hemoglobin 12.5; Platelets 247; Potassium 3.6; Sodium 138    Lipid Panel No results found for: CHOL, TRIG, HDL, CHOLHDL, VLDL, LDLCALC, LDLDIRECT    Wt Readings from Last 3 Encounters:  06/15/17 156 lb (70.8 kg)  06/12/17 160 lb (72.6 kg)  12/30/16 154 lb 9.6  oz (70.1 kg)      ASSESSMENT AND PLAN:  ***   Current medicines are reviewed at length with the patient today.  The patient {ACTIONS; HAS/DOES NOT HAVE:19233} concerns regarding medicines.  The following changes have been made:  {PLAN; NO CHANGE:13088:s}  Labs/ tests ordered today include: *** No orders of the defined types were placed in this encounter.    Disposition:   FU with ***    This note was written with the assistance of speech recognition software.  Please excuse any transcriptional errors.  Signed, Kiffany Schelling C. Duke Salvia, MD, [facility]  08/24/2017 6:28 AM    [facility]

## 2017-08-25 ENCOUNTER — Encounter: Payer: Self-pay | Admitting: *Deleted

## 2017-08-31 ENCOUNTER — Telehealth: Payer: Self-pay | Admitting: Medical

## 2017-08-31 NOTE — Telephone Encounter (Signed)
Will you call pt and ask why she did not go to cardiologist appointment. Let me know why she did not go?

## 2017-09-01 NOTE — Telephone Encounter (Signed)
Pt states she had no idea she had an appointment. Pt states she will call office to reschedule.

## 2017-11-10 ENCOUNTER — Other Ambulatory Visit: Payer: Self-pay | Admitting: Medical

## 2017-11-10 ENCOUNTER — Telehealth: Payer: Self-pay | Admitting: Family Medicine

## 2017-11-10 DIAGNOSIS — M542 Cervicalgia: Secondary | ICD-10-CM

## 2017-11-10 DIAGNOSIS — I10 Essential (primary) hypertension: Secondary | ICD-10-CM

## 2017-11-10 DIAGNOSIS — G43809 Other migraine, not intractable, without status migrainosus: Secondary | ICD-10-CM

## 2017-11-10 DIAGNOSIS — M8580 Other specified disorders of bone density and structure, unspecified site: Secondary | ICD-10-CM

## 2017-11-10 DIAGNOSIS — M25511 Pain in right shoulder: Secondary | ICD-10-CM

## 2017-11-10 NOTE — Telephone Encounter (Signed)
Copied from CRM [id number]. Topic: Quick Communication - See Telephone Encounter >> Nov 10, 2017  1:19 PM Lorrine Kin, NT wrote: CRM for notification. See Telephone encounter for: 11/10/17. Patient is requesting a copy of her current medication list be faxed to [phone]. States that she is trying to call in a refill for her migraine medication but is unsure what that medication is called. Please advise. Cb#: [phone]

## 2017-11-10 NOTE — Telephone Encounter (Signed)
Faxed medication list over per patient

## 2017-11-11 NOTE — Telephone Encounter (Signed)
Faxed failed will resend

## 2017-11-14 NOTE — Telephone Encounter (Signed)
Would like to speak with nurse regarding what meds she is suppose to be on. Would like for nurse to speak with provider to make sure every thing is correct. Call back [phone]

## 2017-11-15 MED ORDER — OLMESARTAN MEDOXOMIL-HCTZ 40-25 MG PO TABS: 1.0000 | ORAL_TABLET | Freq: Every day | ORAL | 0 refills | Status: DC

## 2017-11-15 MED ORDER — TRAZODONE HCL 50 MG PO TABS: 25.0000 mg | ORAL_TABLET | Freq: Every evening | ORAL | 0 refills | Status: DC | PRN

## 2017-11-15 MED ORDER — BUTALBITAL-APAP-CAFFEINE 50-325-40 MG PO TABS: ORAL_TABLET | ORAL | 0 refills | Status: DC

## 2017-11-15 MED ORDER — AMLODIPINE BESYLATE 10 MG PO TABS: 10.0000 mg | ORAL_TABLET | Freq: Every day | ORAL | 0 refills | Status: DC

## 2017-11-15 MED ORDER — ALENDRONATE SODIUM 10 MG PO TABS: 10.0000 mg | ORAL_TABLET | Freq: Every day | ORAL | 0 refills | Status: DC

## 2017-11-15 MED ORDER — METOPROLOL TARTRATE 50 MG PO TABS: 50.0000 mg | ORAL_TABLET | Freq: Two times a day (BID) | ORAL | 0 refills | Status: DC

## 2017-11-15 MED ORDER — TIZANIDINE HCL 4 MG PO CAPS: ORAL_CAPSULE | ORAL | 0 refills | Status: DC

## 2017-11-15 NOTE — Telephone Encounter (Signed)
I ok'd switch to me provided that Dr. Abner Greenspanblyth approves.

## 2017-11-15 NOTE — Telephone Encounter (Signed)
OK with me.

## 2017-11-15 NOTE — Telephone Encounter (Signed)
Will you look at encounter notes. I signed print prescription of butalbital. You can call that in to the pharmacy. Irving Burtonmily printed that for me. Or I could electronically send that in to pharmacy. But can't do both.   Not sure where Irving Burtonmily put that signed prescripton.

## 2017-11-15 NOTE — Telephone Encounter (Signed)
Pt. requesting provider to review and refill medications, as pt. is still unsure about which BP meds she should be taking, and she is currently out of all of her medications. Pt. States the last time she took her medications was Friday 5/31. Pt. is a Dr. Abner GreenspanBlyth patient, but states she would prefer to see Esperanza RichtersEdward Saguier, PA, and make him her PCP due to his greater availability. Appointment made for 6/6 at 0800 with Ramon DredgeEdward per pt. Preference. 30-day supply on non-expired medications on patient's chart made, with additional refills pending per Edward's assessment on 6/6. Findings routed to [place], and note routed to Wm. Wrigley Jr[employer] CompanyJackie Matos to change PCP to [place].

## 2017-11-15 NOTE — Telephone Encounter (Signed)
Pt would like to change provider from Dr Abner GreenspanBlyth to Esperanza RichtersEdward Saguier, if ok with provider.

## 2017-11-17 ENCOUNTER — Ambulatory Visit: Payer: Managed Care, Other (non HMO) | Admitting: Medical

## 2017-11-17 DIAGNOSIS — Z0289 Encounter for other administrative examinations: Secondary | ICD-10-CM

## 2017-11-29 ENCOUNTER — Ambulatory Visit: Payer: Self-pay

## 2017-11-29 NOTE — Telephone Encounter (Signed)
Pt. Reports she has had elevated BP readings for 4 days. No symptoms. Appointment made for tomorrow with her provider.  Reason for Disposition . Systolic BP  >= 180 OR Diastolic >= 110  Answer Assessment - Initial Assessment Questions 1. BLOOD PRESSURE: "What is the blood pressure?" "Did you take at least two measurements 5 minutes apart?"     170/102 2. ONSET: "When did you take your blood pressure?"     This morning 3. HOW: "How did you obtain the blood pressure?" (e.g., visiting nurse, automatic home BP monitor)     Nurse at work 4. HISTORY: "Do you have a history of high blood pressure?"     Yes 5. MEDICATIONS: "Are you taking any medications for blood pressure?" "Have you missed any doses recently?"     No 6. OTHER SYMPTOMS: "Do you have any symptoms?" (e.g., headache, chest pain, blurred vision, difficulty breathing, weakness)     No 7. PREGNANCY: "Is there any chance you are pregnant?" "When was your last menstrual period?"     No  Protocols used: HIGH BLOOD PRESSURE-A-AH

## 2017-11-30 ENCOUNTER — Telehealth: Payer: Self-pay | Admitting: Medical

## 2017-11-30 ENCOUNTER — Ambulatory Visit: Payer: Managed Care, Other (non HMO) | Admitting: Medical

## 2017-11-30 DIAGNOSIS — Z0289 Encounter for other administrative examinations: Secondary | ICD-10-CM

## 2017-11-30 NOTE — Telephone Encounter (Signed)
Patient brother came in and stts that Carla West would take patient on. -Erby PianClifton Johnson ** is brother name. Patient brother wants to know if you would accept?  Adv both providers have to approve before an answer is given.

## 2017-11-30 NOTE — Telephone Encounter (Signed)
Yes I will see her. Let's et her in in next week or two

## 2017-11-30 NOTE — Telephone Encounter (Signed)
I am ok with her seeing Dr. Abner GreenspanBlyth as this was her original intent.   She only switched to me since access was issue recently.   I am happy to help out and see her intermittently. But I think if switches back to Dr. Abner GreenspanBlyth she needs to be seen quickly. She had recent moderate to high bp levels. I think she had appointment with me and may have cancelled.  Please make Dr. Abner GreenspanBlyth aware of the above.

## 2017-12-01 ENCOUNTER — Ambulatory Visit: Payer: Managed Care, Other (non HMO) | Admitting: Medical

## 2017-12-01 NOTE — Telephone Encounter (Signed)
Called Patient and offer appointment for 12-06-2017 @ 5:15pm. Patient agreed for the transfer of care appointment.  Thank you

## 2017-12-06 ENCOUNTER — Ambulatory Visit (INDEPENDENT_AMBULATORY_CARE_PROVIDER_SITE_OTHER): Payer: Managed Care, Other (non HMO) | Admitting: Family Medicine

## 2017-12-06 VITALS — BP 172/111 | HR 76 | Temp 98.0°F | Resp 18 | Wt 153.6 lb

## 2017-12-06 DIAGNOSIS — Z1231 Encounter for screening mammogram for malignant neoplasm of breast: Secondary | ICD-10-CM

## 2017-12-06 DIAGNOSIS — I159 Secondary hypertension, unspecified: Secondary | ICD-10-CM

## 2017-12-06 DIAGNOSIS — M542 Cervicalgia: Secondary | ICD-10-CM | POA: Diagnosis not present

## 2017-12-06 DIAGNOSIS — M8580 Other specified disorders of bone density and structure, unspecified site: Secondary | ICD-10-CM

## 2017-12-06 DIAGNOSIS — Z1239 Encounter for other screening for malignant neoplasm of breast: Secondary | ICD-10-CM

## 2017-12-06 DIAGNOSIS — G43809 Other migraine, not intractable, without status migrainosus: Secondary | ICD-10-CM | POA: Diagnosis not present

## 2017-12-06 DIAGNOSIS — I10 Essential (primary) hypertension: Secondary | ICD-10-CM | POA: Diagnosis not present

## 2017-12-06 DIAGNOSIS — R87619 Unspecified abnormal cytological findings in specimens from cervix uteri: Secondary | ICD-10-CM

## 2017-12-06 MED ORDER — BUTALBITAL-APAP-CAFFEINE 50-325-40 MG PO TABS: ORAL_TABLET | ORAL | 0 refills | Status: DC

## 2017-12-06 MED ORDER — TIZANIDINE HCL 4 MG PO CAPS: 4.0000 mg | ORAL_CAPSULE | Freq: Every evening | ORAL | 2 refills | Status: DC | PRN

## 2017-12-06 MED ORDER — OLMESARTAN MEDOXOMIL-HCTZ 40-25 MG PO TABS: 1.0000 | ORAL_TABLET | Freq: Every day | ORAL | 5 refills | Status: DC

## 2017-12-06 MED ORDER — AMLODIPINE BESYLATE 10 MG PO TABS: 10.0000 mg | ORAL_TABLET | Freq: Every day | ORAL | 5 refills | Status: DC

## 2017-12-06 MED ORDER — ZOLMITRIPTAN 2.5 MG PO TABS: 2.5000 mg | ORAL_TABLET | Freq: Every day | ORAL | 5 refills | Status: DC | PRN

## 2017-12-06 MED ORDER — METOPROLOL TARTRATE 100 MG PO TABS: 100.0000 mg | ORAL_TABLET | Freq: Two times a day (BID) | ORAL | 3 refills | Status: DC

## 2017-12-06 MED ORDER — ALENDRONATE SODIUM 70 MG PO TABS: 70.0000 mg | ORAL_TABLET | ORAL | 11 refills | Status: DC

## 2017-12-06 MED ORDER — TRAZODONE HCL 50 MG PO TABS
25.0000 mg | ORAL_TABLET | Freq: Every evening | ORAL | 5 refills | Status: DC | PRN
Start: 2017-12-06 — End: 2018-12-14

## 2017-12-06 NOTE — Progress Notes (Signed)
Subjective:  I acted as a Education administrator for Dr. Charlett Blake. [place], [place]  Patient ID: Carla West, female    DOB: June 28, 1960, 57 y.o.   MRN: [id number]  No chief complaint on file.   HPI  Patient is in today to re establish care. She has been struggling with very difficult to control blood pressure this past year despite numerous medication adjustments. She endorses some migraine headaches but they have not worsened in intensity or frequency with her blood pressure problems. No recent febrile illness or hospitalizations. Denies CP/palp/SOB/congestion/fevers/GI or GU c/o. Taking meds as prescribed  Patient Care Team: Mosie Lukes, MD as PCP - General (Family Medicine)   Past Medical History:  Diagnosis Date  . Abnormal Pap smear of cervix 08/03/2015  . Benign essential HTN 08/03/2015  . History of chicken pox   . Migraine headache 08/03/2015    Past Surgical History:  Procedure Laterality Date  . TUBAL LIGATION      Family History  Problem Relation Age of Onset  . Hypertension Mother   . Heart disease Mother   . Hyperlipidemia Father   . Hyperlipidemia Sister   . Other Brother        plan crash in [employer]  . Migraines Son   . Heart disease Brother        CHF, arrythmia  . Hypertension Brother   . Gout Brother   . Hypertension Brother     Social History   Socioeconomic History  . Marital status: Single    Spouse name: Not on file  . Number of children: Not on file  . Years of education: Not on file  . Highest education level: Not on file  Occupational History  . Not on file  Social Needs  . Financial resource strain: Not on file  . Food insecurity:    Worry: Not on file    Inability: Not on file  . Transportation needs:    Medical: Not on file    Non-medical: Not on file  Tobacco Use  . Smoking status: Never Smoker  . Smokeless tobacco: Never Used  Substance and Sexual Activity  . Alcohol use: No  . Drug use: No  . Sexual activity: Never    Comment:  lives with brother. 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 Physical activity:    Days per week: Not on file    Minutes per session: Not on file  . Stress: Not on file  Relationships  . Social connections:    Talks on phone: Not on file    Gets together: Not on file    Attends religious service: Not on file    Active member of club or organization: Not on file    Attends meetings of clubs or organizations: Not on file    Relationship status: Not on file  . Intimate partner violence:    Fear of current or ex partner: Not on file    Emotionally abused: Not on file    Physically abused: Not on file    Forced sexual activity: Not on file  Other Topics Concern  . Not on file  Social History Narrative  . Not on file    Outpatient Medications Prior to Visit  Medication Sig Dispense Refill  . alendronate (FOSAMAX) 10 MG tablet Take 1 tablet (10 mg total) by mouth daily before breakfast. Take with a full glass of water on an empty stomach. 30 tablet 0  . amLODipine (NORVASC)  10 MG tablet Take 1 tablet (10 mg total) by mouth daily. 30 tablet 0  . butalbital-acetaminophen-caffeine (FIORICET, ESGIC) 50-325-40 MG tablet TAKE 1 TABLET BY MOUTH EVERY 6 HOURS AS NEEDED FOR HEADACHE 16 tablet 0  . metoprolol tartrate (LOPRESSOR) 50 MG tablet Take 1 tablet (50 mg total) by mouth 2 (two) times daily. 30 tablet 0  . olmesartan-hydrochlorothiazide (BENICAR HCT) 40-25 MG tablet Take 1 tablet by mouth daily. 30 tablet 0  . tiZANidine (ZANAFLEX) 4 MG capsule 1 tab po q hs 30 capsule 0  . traZODone (DESYREL) 50 MG tablet Take 0.5-1 tablets (25-50 mg total) by mouth at bedtime as needed for sleep. 30 tablet 0  . ZOLMitriptan (ZOMIG) 2.5 MG tablet Take 1 tablet (2.5 mg total) by mouth once for 1 dose. May repeat in 2 hours if headache persists or recurs. 10 tablet 0   No facility-administered medications prior to visit.     Allergies  Allergen Reactions  . Elavil  [Amitriptyline Hcl]     Review of Systems  Constitutional: Positive for malaise/fatigue. Negative for fever.  HENT: Negative for congestion.   Eyes: Negative for blurred vision.  Respiratory: Negative for shortness of breath.   Cardiovascular: Negative for chest pain, palpitations and leg swelling.  Gastrointestinal: Negative for abdominal pain, blood in stool and nausea.  Genitourinary: Negative for dysuria and frequency.  Musculoskeletal: Negative for falls.  Skin: Negative for rash.  Neurological: Positive for headaches. Negative for dizziness and loss of consciousness.  Endo/Heme/Allergies: Negative for environmental allergies.  Psychiatric/Behavioral: Negative for depression. The patient is not nervous/anxious.        Objective:    Physical Exam  Constitutional: She is oriented to person, place, and time. She appears well-developed and well-nourished. No distress.  HENT:  Head: Normocephalic and atraumatic.  Nose: Nose normal.  Eyes: Right eye exhibits no discharge. Left eye exhibits no discharge.  Neck: Normal range of motion. Neck supple.  Cardiovascular: Normal rate and regular rhythm.  No murmur heard. Pulmonary/Chest: Effort normal and breath sounds normal.  Abdominal: Soft. Bowel sounds are normal. There is no tenderness.  Musculoskeletal: She exhibits no edema.  Neurological: She is alert and oriented to person, place, and time.  Skin: Skin is warm and dry.  Psychiatric: She has a normal mood and affect.  Nursing note and vitals reviewed.   BP (!) 172/111   Pulse 76   Temp 98 F (36.7 C) (Oral)   Resp 18   Wt 153 lb 9.6 oz (69.7 kg)   SpO2 98%   BMI 24.06 kg/m  Wt Readings from Last 3 Encounters:  12/06/17 153 lb 9.6 oz (69.7 kg)  06/15/17 156 lb (70.8 kg)  06/12/17 160 lb (72.6 kg)   BP Readings from Last 3 Encounters:  12/07/17 (!) 172/111  06/15/17 (!) 180/110  06/12/17 (!) 196/118     Immunization History  Administered Date(s) Administered    . Tdap 11/13/2015    Health Maintenance  Topic Date Due  . Hepatitis C Screening  July 20, 1960  . HIV Screening  01/22/1978  . COLONOSCOPY  01/22/2013  . PAP SMEAR  06/14/2017  . MAMMOGRAM  09/23/2017  . INFLUENZA VACCINE  01/12/2018  . TETANUS/TDAP  11/12/2025    Lab Results  Component Value Date   WBC 5.1 06/12/2017   HGB 12.5 06/12/2017   HCT 38.4 06/12/2017   PLT 247 06/12/2017   GLUCOSE 100 (H) 06/12/2017   ALT 58 (H) 12/30/2016   AST 42 (H) 12/30/2016  NA 138 06/12/2017   K 3.6 06/12/2017   CL 104 06/12/2017   CREATININE 0.76 06/12/2017   BUN 15 06/12/2017   CO2 24 06/12/2017   TSH 0.54 08/06/2016    Lab Results  Component Value Date   TSH 0.54 08/06/2016   Lab Results  Component Value Date   WBC 5.1 06/12/2017   HGB 12.5 06/12/2017   HCT 38.4 06/12/2017   MCV 85.5 06/12/2017   PLT 247 06/12/2017   Lab Results  Component Value Date   NA 138 06/12/2017   K 3.6 06/12/2017   CO2 24 06/12/2017   GLUCOSE 100 (H) 06/12/2017   BUN 15 06/12/2017   CREATININE 0.76 06/12/2017   BILITOT 0.4 12/30/2016   ALKPHOS 77 12/30/2016   AST 42 (H) 12/30/2016   ALT 58 (H) 12/30/2016   PROT 6.8 12/30/2016   ALBUMIN 3.9 12/30/2016   CALCIUM 9.1 06/12/2017   ANIONGAP 10 06/12/2017   GFR 94.78 12/30/2016   No results found for: CHOL No results found for: HDL No results found for: LDLCALC No results found for: TRIG No results found for: CHOLHDL No results found for: HGBA1C       Assessment & Plan:   Problem List Items Addressed This Visit    Essential hypertension    resistent to treatment despite taking several meds and having several meds changed. Will increase metoprolol to 100 mg po bid and then continue Amlodipine and OlmesartanHCT. Will proceed with renal ultrasound and see her back next week for bp check if she develops new symptoms she will seek care      Relevant Medications   olmesartan-hydrochlorothiazide (BENICAR HCT) 40-25 MG tablet   amLODipine  (NORVASC) 10 MG tablet   metoprolol tartrate (LOPRESSOR) 100 MG tablet   Migraine headache    She has a long history of migraines which stopped responding to Imitrex but respond to Zomig and Fioricet so Is given refills. She does not endorse that they are worsening with her blood pressure problems.      Relevant Medications   olmesartan-hydrochlorothiazide (BENICAR HCT) 40-25 MG tablet   amLODipine (NORVASC) 10 MG tablet   traZODone (DESYREL) 50 MG tablet   tiZANidine (ZANAFLEX) 4 MG capsule   ZOLMitriptan (ZOMIG) 2.5 MG tablet   metoprolol tartrate (LOPRESSOR) 100 MG tablet   butalbital-acetaminophen-caffeine (FIORICET, ESGIC) 50-325-40 MG tablet   Abnormal Pap smear of cervix    Scheduled for annual exam with pap      Breast cancer screening    MGM ordered      Relevant Orders   MM DIAG BREAST TOMO BILATERAL   Osteopenia    Has been on Fosamax this past year. Will repeat a Dexa scan and if it shows osteoporosis will switch to the 70 mg tabs weekly. Encouraged to get adequate exercise, calcium and vitamin d intake      Relevant Orders   DG Bone Density    Other Visit Diagnoses    Secondary hypertension    -  Primary   Relevant Medications   olmesartan-hydrochlorothiazide (BENICAR HCT) 40-25 MG tablet   amLODipine (NORVASC) 10 MG tablet   metoprolol tartrate (LOPRESSOR) 100 MG tablet   Other Relevant Orders   Comp Met (CMET)   CBC   Lipid Profile   TSH   T4, free   US RENAL ARTERY DUPLEX COMPLETE   Neck pain       Relevant Medications   traZODone (DESYREL) 50 MG tablet   tiZANidine (ZANAFLEX) 4 MG  capsule      I have discontinued Monda Bracamonte's alendronate and metoprolol tartrate. I have also changed her tiZANidine and ZOLMitriptan. Additionally, I am having her start on metoprolol tartrate and alendronate. Lastly, I am having her maintain her olmesartan-hydrochlorothiazide, amLODipine, traZODone, and butalbital-acetaminophen-caffeine.  Meds ordered this  encounter  Medications  . olmesartan-hydrochlorothiazide (BENICAR HCT) 40-25 MG tablet    Sig: Take 1 tablet by mouth daily.    Dispense:  30 tablet    Refill:  5  . amLODipine (NORVASC) 10 MG tablet    Sig: Take 1 tablet (10 mg total) by mouth daily.    Dispense:  30 tablet    Refill:  5  . traZODone (DESYREL) 50 MG tablet    Sig: Take 0.5-1 tablets (25-50 mg total) by mouth at bedtime as needed for sleep.    Dispense:  30 tablet    Refill:  5  . tiZANidine (ZANAFLEX) 4 MG capsule    Sig: Take 1 capsule (4 mg total) by mouth at bedtime as needed for muscle spasms. 1 tab po q hs    Dispense:  30 capsule    Refill:  2  . DISCONTD: butalbital-acetaminophen-caffeine (FIORICET, ESGIC) 50-325-40 MG tablet    Sig: TAKE 1 TABLET BY MOUTH EVERY 6 HOURS AS NEEDED FOR HEADACHE    Dispense:  30 tablet    Refill:  0  . ZOLMitriptan (ZOMIG) 2.5 MG tablet    Sig: Take 1 tablet (2.5 mg total) by mouth daily as needed for migraine or headache. May repeat in 2 hours if headache persists or recurs.    Dispense:  10 tablet    Refill:  5  . metoprolol tartrate (LOPRESSOR) 100 MG tablet    Sig: Take 1 tablet (100 mg total) by mouth 2 (two) times daily.    Dispense:  60 tablet    Refill:  3  . alendronate (FOSAMAX) 70 MG tablet    Sig: Take 1 tablet (70 mg total) by mouth every 7 (seven) days. Take with a full glass of water on an empty stomach.    Dispense:  4 tablet    Refill:  11  . butalbital-acetaminophen-caffeine (FIORICET, ESGIC) 50-325-40 MG tablet    Sig: TAKE 1 TABLET BY MOUTH EVERY 6 HOURS AS NEEDED FOR HEADACHE    Dispense:  30 tablet    Refill:  0    CMA served as scribe during this visit. History, Physical and Plan performed by medical provider. Documentation and orders reviewed and attested to.  Penni Homans, MD

## 2017-12-06 NOTE — Patient Instructions (Signed)

## 2017-12-07 ENCOUNTER — Other Ambulatory Visit: Payer: Self-pay | Admitting: *Deleted

## 2017-12-07 DIAGNOSIS — M8588 Other specified disorders of bone density and structure, other site: Secondary | ICD-10-CM | POA: Insufficient documentation

## 2017-12-07 DIAGNOSIS — M8580 Other specified disorders of bone density and structure, unspecified site: Secondary | ICD-10-CM | POA: Insufficient documentation

## 2017-12-07 DIAGNOSIS — Z0000 Encounter for general adult medical examination without abnormal findings: Secondary | ICD-10-CM | POA: Insufficient documentation

## 2017-12-07 DIAGNOSIS — I159 Secondary hypertension, unspecified: Secondary | ICD-10-CM

## 2017-12-07 LAB — COMPREHENSIVE METABOLIC PANEL
ALT: 22 U/L (ref 0–35)
AST: 19 U/L (ref 0–37)
Albumin: 4.2 g/dL (ref 3.5–5.2)
Alkaline Phosphatase: 88 U/L (ref 39–117)
BUN: 19 mg/dL (ref 6–23)
CO2: 28 mEq/L (ref 19–32)
Calcium: 9.5 mg/dL (ref 8.4–10.5)
Chloride: 105 mEq/L (ref 96–112)
Creatinine, Ser: 0.77 mg/dL (ref 0.40–1.20)
GFR: 100.14 mL/min (ref >60.00)
Glucose, Bld: 102 mg/dL — ABNORMAL HIGH (ref 70–99)
Potassium: 3.8 mEq/L (ref 3.5–5.1)
Sodium: 141 mEq/L (ref 135–145)
Total Bilirubin: 0.2 mg/dL (ref 0.2–1.2)
Total Protein: 7.2 g/dL (ref 6.0–8.3)

## 2017-12-07 LAB — CBC
HCT: 36.1 % (ref 36.0–46.0)
Hemoglobin: 12.1 g/dL (ref 12.0–15.0)
MCHC: 33.5 g/dL (ref 30.0–36.0)
MCV: 84.7 fl (ref 78.0–100.0)
Platelets: 245 10*3/uL (ref 150.0–400.0)
RBC: 4.26 Mil/uL (ref 3.87–5.11)
RDW: 13.4 % (ref 11.5–15.5)
WBC: 4.2 10*3/uL (ref 4.0–10.5)

## 2017-12-07 LAB — LIPID PANEL
Cholesterol: 235 mg/dL — ABNORMAL HIGH (ref 0–200)
HDL: 81.9 mg/dL (ref >39.00)
LDL Cholesterol: 135 mg/dL — ABNORMAL HIGH (ref 0–99)
NonHDL: 153.05
Total CHOL/HDL Ratio: 3
Triglycerides: 90 mg/dL (ref 0.0–149.0)
VLDL: 18 mg/dL (ref 0.0–40.0)

## 2017-12-07 LAB — TSH: TSH: <0.01 u[IU]/mL — ABNORMAL LOW (ref 0.35–4.50)

## 2017-12-07 LAB — T4, FREE: Free T4: 1.46 ng/dL (ref 0.60–1.60)

## 2017-12-07 NOTE — Assessment & Plan Note (Signed)
Has been on Fosamax this past year. Will repeat a Dexa scan and if it shows osteoporosis will switch to the 70 mg tabs weekly. Encouraged to get adequate exercise, calcium and vitamin d intake

## 2017-12-07 NOTE — Assessment & Plan Note (Signed)
MGM ordered 

## 2017-12-07 NOTE — Assessment & Plan Note (Signed)
Scheduled for annual exam with pap

## 2017-12-07 NOTE — Assessment & Plan Note (Signed)
resistent to treatment despite taking several meds and having several meds changed. Will increase metoprolol to 100 mg po bid and then continue Amlodipine and OlmesartanHCT. Will proceed with renal ultrasound and see her back next week for bp check if she develops new symptoms she will seek care

## 2017-12-07 NOTE — Assessment & Plan Note (Signed)
She has a long history of migraines which stopped responding to Imitrex but respond to Zomig and Fioricet so Is given refills. She does not endorse that they are worsening with her blood pressure problems.

## 2017-12-13 ENCOUNTER — Ambulatory Visit (HOSPITAL_COMMUNITY)
Admission: RE | Admit: 2017-12-13 | Discharge: 2017-12-13 | Disposition: A | Discharge status: Home / Self Care | Payer: Managed Care, Other (non HMO) | Source: Ambulatory Visit | Attending: Family Medicine | Admitting: Family Medicine

## 2017-12-13 DIAGNOSIS — I159 Secondary hypertension, unspecified: Secondary | ICD-10-CM | POA: Diagnosis not present

## 2017-12-13 NOTE — Progress Notes (Signed)
Preliminary results by tech - Renal duplex Completed. Marilynne Halstedita Hernan Turnage, BS, RDMS, RVT

## 2017-12-14 ENCOUNTER — Ambulatory Visit (INDEPENDENT_AMBULATORY_CARE_PROVIDER_SITE_OTHER): Payer: Managed Care, Other (non HMO) | Admitting: Medical

## 2017-12-14 ENCOUNTER — Other Ambulatory Visit: Payer: Self-pay

## 2017-12-14 DIAGNOSIS — I10 Essential (primary) hypertension: Secondary | ICD-10-CM

## 2017-12-14 DIAGNOSIS — G43809 Other migraine, not intractable, without status migrainosus: Secondary | ICD-10-CM

## 2017-12-14 NOTE — Progress Notes (Addendum)
Pt here for Blood pressure check per Dr. Abner GreenspanBlyth  Pt currently takes Metoprolol 100 mg bid, amlodipine 10 mg, and olmesartan 40-25 mg daily   Pt reports compliance with medication.  BP today @ =116/75  HR =63  Pt advised per Esperanza RichtersEdward Saguier DOD to continue current medication regimen and to follow up with pcp in  Months. Patient states she does not want to wait that long and states she has appointment on 12/22/17.   Agree with the above advise/we discussed.  Esperanza RichtersEdward Saguier, PA-C

## 2017-12-14 NOTE — Telephone Encounter (Signed)
While in with nurse visit today 12/14/17 pt presented with several issues. Pt states her zomig and tizanidine are too expensive for her to purchase at the pharmacy she would like to know if there is an alternative. She states she has noticed a rash on her arms and if she touches it it flares up. She would also like to know the Renal results preformed yesterday. Advised to her I do not have the results but when we do we will give her a call. Final thing, she states she has noticed varicose veins on her legs and feels like "they are tiny spiders pinching her" .  I advised patient she will need an appointment to discuss this but she would like me to send you this message.

## 2017-12-15 NOTE — Telephone Encounter (Signed)
Notify labs mostly good, renal blood work normal vascular ultrasound normal. TSH suppressed but free T4 normal can refer to endocrinology if she would like. Would like to run a thyroid peroxidase antibody and a free T3 since the previous 2 thyroid labs contradict each other. Cholesterol is up as previously noted. maintain heart healthy diet, increase exercise, avoid trans fats, consider a krill oil cap daily

## 2017-12-15 NOTE — Telephone Encounter (Signed)
Tizanidine is usually the cheapest one she can shop around to other pharmacies or have the pharmacist look up her formulary and see what other muscle relaxer would be cheaper with her insurance. Can do the same with the Zomig. Find out what other triptan is cheaper and we can switch it. Can refer to vascular surgeon to have her veins evaluated if she would like or we can discuss at next visit. Same thing with skin rash can refer to derm or eval at next visit

## 2017-12-16 NOTE — Telephone Encounter (Signed)
Spoke with patient she stated she wants a new medication sent to her pharmacy for her migraines.  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   Please advise

## 2017-12-17 NOTE — Telephone Encounter (Signed)
OK send in Axert 12.5 mg tabs, 1 tab po prn migraine if no resolution after 2 hours can repeat dosing once, max of 25 mg in 24 hours. Disp #10

## 2017-12-20 MED ORDER — ALMOTRIPTAN MALATE 12.5 MG PO TABS: 12.5000 mg | ORAL_TABLET | Freq: Two times a day (BID) | ORAL | 0 refills | Status: DC | PRN

## 2017-12-20 NOTE — Addendum Note (Signed)
Addended by: Valentina GuMELLEN, EMILY R on: 12/20/2017 09:17 AM   Modules accepted: Orders

## 2017-12-20 NOTE — Telephone Encounter (Signed)
Author phoned pt. To relay Dr. Mariel AloeBlyth's message re: starting axert for migraines. No answer, unable to leave VM d/t full mailbox. Will try again later. Order pended for Dr. Abner GreenspanBlyth review.

## 2017-12-21 NOTE — Telephone Encounter (Signed)
Author phoned pt. to relay axert instructions, and pt. verbalized understanding. Scheduled to see Dr. Abner GreenspanBlyth 7/11 at 0830.

## 2017-12-22 ENCOUNTER — Encounter: Payer: Self-pay | Admitting: Family Medicine

## 2017-12-22 ENCOUNTER — Ambulatory Visit (INDEPENDENT_AMBULATORY_CARE_PROVIDER_SITE_OTHER): Payer: Managed Care, Other (non HMO) | Admitting: Family Medicine

## 2017-12-22 VITALS — BP 126/85 | HR 64 | Temp 98.2°F | Resp 18 | Wt 150.6 lb

## 2017-12-22 DIAGNOSIS — E785 Hyperlipidemia, unspecified: Secondary | ICD-10-CM

## 2017-12-22 DIAGNOSIS — I10 Essential (primary) hypertension: Secondary | ICD-10-CM | POA: Diagnosis not present

## 2017-12-22 DIAGNOSIS — R7989 Other specified abnormal findings of blood chemistry: Secondary | ICD-10-CM | POA: Diagnosis not present

## 2017-12-22 DIAGNOSIS — G43809 Other migraine, not intractable, without status migrainosus: Secondary | ICD-10-CM

## 2017-12-22 MED ORDER — CYCLOBENZAPRINE HCL 5 MG PO TABS: 2.5000 mg | ORAL_TABLET | Freq: Every evening | ORAL | 1 refills | Status: DC | PRN

## 2017-12-22 NOTE — Patient Instructions (Addendum)
[employer] at [email or web address] best supplements  Vitamin B complex, Vitamin D 2000 IU daily, Multivitamin with minerals once to twice weekly  covermymeds Goodrx  [facility], [facility], [facility] Hypertension Hypertension, commonly called high blood pressure, is when the force of blood pumping through the arteries is too strong. The arteries are the blood vessels that carry blood from the heart throughout the body. Hypertension forces the heart to work harder to pump blood and may cause arteries to become narrow or stiff. Having untreated or uncontrolled hypertension can cause heart attacks, strokes, kidney disease, and other problems. A blood pressure reading consists of a higher number over a lower number. Ideally, your blood pressure should be below 120/80. The first ("top") number is called the systolic pressure. It is a measure of the pressure in your arteries as your heart beats. The second ("bottom") number is called the diastolic pressure. It is a measure of the pressure in your arteries as the heart relaxes. What are the causes? The cause of this condition is not known. What increases the risk? Some risk factors for high blood pressure are under your control. Others are not. Factors you can change  Smoking.  Having type 2 diabetes mellitus, high cholesterol, or both.  Not getting enough exercise or physical activity.  Being overweight.  Having too much fat, sugar, calories, or salt (sodium) in your diet.  Drinking too much alcohol. Factors that are difficult or impossible to change  Having chronic kidney disease.  Having a family history of high blood pressure.  Age. Risk increases with age.  Race. You may be at higher risk if you are African-American.  Gender. Men are at higher risk than women before age 91. After age 67, women are at higher risk than men.  Having obstructive sleep apnea.  Stress. What are the signs or symptoms? Extremely high blood pressure  (hypertensive crisis) may cause:  Headache.  Anxiety.  Shortness of breath.  Nosebleed.  Nausea and vomiting.  Severe chest pain.  Jerky movements you cannot control (seizures).  How is this diagnosed? This condition is diagnosed by measuring your blood pressure while you are seated, with your arm resting on a surface. The cuff of the blood pressure monitor will be placed directly against the skin of your upper arm at the level of your heart. It should be measured at least twice using the same arm. Certain conditions can cause a difference in blood pressure between your right and left arms. Certain factors can cause blood pressure readings to be lower or higher than normal (elevated) for a short period of time:  When your blood pressure is higher when you are in a health care provider's office than when you are at home, this is called white coat hypertension. Most people with this condition do not need medicines.  When your blood pressure is higher at home than when you are in a health care provider's office, this is called masked hypertension. Most people with this condition may need medicines to control blood pressure.  If you have a high blood pressure reading during one visit or you have normal blood pressure with other risk factors:  You may be asked to return on a different day to have your blood pressure checked again.  You may be asked to monitor your blood pressure at home for 1 week or longer.  If you are diagnosed with hypertension, you may have other blood or imaging tests to help your health care provider understand your overall  risk for other conditions. How is this treated? This condition is treated by making healthy lifestyle changes, such as eating healthy foods, exercising more, and reducing your alcohol intake. Your health care provider may prescribe medicine if lifestyle changes are not enough to get your blood pressure under control, and if:  Your systolic blood  pressure is above 130.  Your diastolic blood pressure is above 80.  Your personal target blood pressure may vary depending on your medical conditions, your age, and other factors. Follow these instructions at home: Eating and drinking  Eat a diet that is high in fiber and potassium, and low in sodium, added sugar, and fat. An example eating plan is called the DASH (Dietary Approaches to Stop Hypertension) diet. To eat this way: ? Eat plenty of fresh fruits and vegetables. Try to fill half of your plate at each meal with fruits and vegetables. ? Eat whole grains, such as whole wheat pasta, brown rice, or whole grain bread. Fill about one quarter of your plate with whole grains. ? Eat or drink low-fat dairy products, such as skim milk or low-fat yogurt. ? Avoid fatty cuts of meat, processed or cured meats, and poultry with skin. Fill about one quarter of your plate with lean proteins, such as fish, chicken without skin, beans, eggs, and tofu. ? Avoid premade and processed foods. These tend to be higher in sodium, added sugar, and fat.  Reduce your daily sodium intake. Most people with hypertension should eat less than 1,500 mg of sodium a day.  Limit alcohol intake to no more than 1 drink a day for nonpregnant women and 2 drinks a day for men. One drink equals 12 oz of beer, 5 oz of wine, or 1 oz of hard liquor. Lifestyle  Work with your health care provider to maintain a healthy body weight or to lose weight. Ask what an ideal weight is for you.  Get at least 30 minutes of exercise that causes your heart to beat faster (aerobic exercise) most days of the week. Activities may include walking, swimming, or biking.  Include exercise to strengthen your muscles (resistance exercise), such as pilates or lifting weights, as part of your weekly exercise routine. Try to do these types of exercises for 30 minutes at least 3 days a week.  Do not use any products that contain nicotine or tobacco, such  as cigarettes and e-cigarettes. If you need help quitting, ask your health care provider.  Monitor your blood pressure at home as told by your health care provider.  Keep all follow-up visits as told by your health care provider. This is important. Medicines  Take over-the-counter and prescription medicines only as told by your health care provider. Follow directions carefully. Blood pressure medicines must be taken as prescribed.  Do not skip doses of blood pressure medicine. Doing this puts you at risk for problems and can make the medicine less effective.  Ask your health care provider about side effects or reactions to medicines that you should watch for. Contact a health care provider if:  You think you are having a reaction to a medicine you are taking.  You have headaches that keep coming back (recurring).  You feel dizzy.  You have swelling in your ankles.  You have trouble with your vision. Get help right away if:  You develop a severe headache or confusion.  You have unusual weakness or numbness.  You feel faint.  You have severe pain in your chest or abdomen.  You vomit repeatedly.  You have trouble breathing. Summary  Hypertension is when the force of blood pumping through your arteries is too strong. If this condition is not controlled, it may put you at risk for serious complications.  Your personal target blood pressure may vary depending on your medical conditions, your age, and other factors. For most people, a normal blood pressure is less than 120/80.  Hypertension is treated with lifestyle changes, medicines, or a combination of both. Lifestyle changes include weight loss, eating a healthy, low-sodium diet, exercising more, and limiting alcohol. This information is not intended to replace advice given to you by your health care provider. Make sure you discuss any questions you have with your health care provider. Document Released: 05/31/2005 Document  Revised: 04/28/2016 Document Reviewed: 04/28/2016 Elsevier Interactive Patient Education  [employer].

## 2017-12-22 NOTE — Progress Notes (Signed)
Subjective:  I acted as a Neurosurgeon for Dr. Abner Greenspan. [place], [place]  Patient ID: Carla West, female    DOB: [birth year], 57 y.o.   MRN: [id number]  Chief Complaint  Patient presents with  . Follow-up    HPI  Patient is in today for 3 week follow up and her blood pressure shows much improvement. She continues to struggle with fatigue and long work hours.  She is tolerating the new blood pressure medications.  She has had some trouble with back pain.  No recent febrile illness or acute hospitalizations.  She had a renal ultrasound which was unremarkable. Denies CP/palp/SOB/HA/congestion/fevers/GI or GU c/o. Taking meds as prescribed  Patient Care Team: Bradd Canary, MD as PCP - General (Family Medicine)   Past Medical History:  Diagnosis Date  . Abnormal Pap smear of cervix 08/03/2015  . Benign essential HTN 08/03/2015  . History of chicken pox   . Migraine headache 08/03/2015    Past Surgical History:  Procedure Laterality Date  . TUBAL LIGATION      Family History  Problem Relation Age of Onset  . Hypertension Mother   . Heart disease Mother   . Hyperlipidemia Father   . Hyperlipidemia Sister   . Other Brother        plan crash in [employer]  . Migraines Son   . Heart disease Brother        CHF, arrythmia  . Hypertension Brother   . Gout Brother   . Hypertension Brother     Social History   Socioeconomic History  . Marital status: Single    Spouse name: Not on file  . Number of children: Not on file  . Years of education: Not on file  . Highest education level: Not on file  Occupational History  . Not on file  Social Needs  . Financial resource strain: Not on file  . Food insecurity:    Worry: Not on file    Inability: Not on file  . Transportation needs:    Medical: Not on file    Non-medical: Not on file  Tobacco Use  . Smoking status: Never Smoker  . Smokeless tobacco: Never Used  Substance and Sexual Activity  . Alcohol use: No  . Drug use: No    . 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 Physical activity:    Days per week: Not on file    Minutes per session: Not on file  . Stress: Not on file  Relationships  . Social connections:    Talks on phone: Not on file    Gets together: Not on file    Attends religious service: Not on file    Active member of club or organization: Not on file    Attends meetings of clubs or organizations: Not on file    Relationship status: Not on file  . Intimate partner violence:    Fear of current or ex partner: Not on file    Emotionally abused: Not on file    Physically abused: Not on file    Forced sexual activity: Not on file  Other Topics Concern  . Not on file  Social History Narrative  . Not on file    Outpatient Medications Prior to Visit  Medication Sig Dispense Refill  . alendronate (FOSAMAX) 70 MG tablet Take 1 tablet (70 mg total) by mouth every 7 (seven) days. Take  with a full glass of water on an empty stomach. 4 tablet 11  . almotriptan (AXERT) 12.5 MG tablet Take 1 tablet (12.5 mg total) by mouth 2 (two) times daily as needed for migraine. may repeat in 2 hours if needed 10 tablet 0  . amLODipine (NORVASC) 10 MG tablet Take 1 tablet (10 mg total) by mouth daily. 30 tablet 5  . butalbital-acetaminophen-caffeine (FIORICET, ESGIC) 50-325-40 MG tablet TAKE 1 TABLET BY MOUTH EVERY 6 HOURS AS NEEDED FOR HEADACHE 30 tablet 0  . metoprolol tartrate (LOPRESSOR) 100 MG tablet Take 1 tablet (100 mg total) by mouth 2 (two) times daily. 60 tablet 3  . olmesartan-hydrochlorothiazide (BENICAR HCT) 40-25 MG tablet Take 1 tablet by mouth daily. 30 tablet 5  . traZODone (DESYREL) 50 MG tablet Take 0.5-1 tablets (25-50 mg total) by mouth at bedtime as needed for sleep. 30 tablet 5  . tiZANidine (ZANAFLEX) 4 MG capsule Take 1 capsule (4 mg total) by mouth at bedtime as needed for muscle spasms. 1 tab po q hs  30 capsule 2  . ZOLMitriptan (ZOMIG) 2.5 MG tablet Take 1 tablet (2.5 mg total) by mouth daily as needed for migraine or headache. May repeat in 2 hours if headache persists or recurs. 10 tablet 5   No facility-administered medications prior to visit.     Allergies  Allergen Reactions  . Elavil [Amitriptyline Hcl]     Review of Systems  Constitutional: Positive for malaise/fatigue. Negative for fever.  HENT: Negative for congestion.   Eyes: Negative for blurred vision.  Respiratory: Negative for shortness of breath.   Cardiovascular: Negative for chest pain, palpitations and leg swelling.  Gastrointestinal: Negative for abdominal pain, blood in stool and nausea.  Genitourinary: Negative for dysuria and frequency.  Musculoskeletal: Positive for back pain. Negative for falls.  Skin: Negative for rash.  Neurological: Positive for headaches. Negative for dizziness and loss of consciousness.  Endo/Heme/Allergies: Negative for environmental allergies.  Psychiatric/Behavioral: Negative for depression. The patient is not nervous/anxious.        Objective:    Physical Exam  Constitutional: She is oriented to person, place, and time. She appears well-developed and well-nourished. No distress.  HENT:  Head: Normocephalic and atraumatic.  Nose: Nose normal.  Eyes: Right eye exhibits no discharge. Left eye exhibits no discharge.  Neck: Normal range of motion. Neck supple.  Cardiovascular: Normal rate and regular rhythm.  No murmur heard. Pulmonary/Chest: Effort normal and breath sounds normal.  Abdominal: Soft. Bowel sounds are normal. There is no tenderness.  Musculoskeletal: She exhibits no edema.  Neurological: She is alert and oriented to person, place, and time.  Skin: Skin is warm and dry.  Psychiatric: She has a normal mood and affect.  Nursing note and vitals reviewed.   BP 126/85 (BP Location: Left Arm, Patient Position: Sitting, Cuff Size: Normal)   Pulse 64   Temp 98.2  F (36.8 C) (Oral)   Resp 18   Wt 150 lb 9.6 oz (68.3 kg)   SpO2 100%   BMI 23.59 kg/m  Wt Readings from Last 3 Encounters:  12/22/17 150 lb 9.6 oz (68.3 kg)  12/06/17 153 lb 9.6 oz (69.7 kg)  06/15/17 156 lb (70.8 kg)   BP Readings from Last 3 Encounters:  12/22/17 126/85  12/07/17 (!) 172/111  06/15/17 (!) 180/110     Immunization History  Administered Date(s) Administered  . Tdap 11/13/2015    Health Maintenance  Topic Date Due  . Hepatitis C Screening  1960-12-28  . HIV Screening  01/22/1978  . COLONOSCOPY  01/22/2013  . PAP SMEAR  06/14/2017  . MAMMOGRAM  09/23/2017  . INFLUENZA VACCINE  01/12/2018  . TETANUS/TDAP  11/12/2025    Lab Results  Component Value Date   WBC 4.2 12/06/2017   HGB 12.1 12/06/2017   HCT 36.1 12/06/2017   PLT 245.0 12/06/2017   GLUCOSE 102 (H) 12/06/2017   CHOL 235 (H) 12/06/2017   TRIG 90.0 12/06/2017   HDL 81.90 12/06/2017   LDLCALC 135 (H) 12/06/2017   ALT 22 12/06/2017   AST 19 12/06/2017   NA 141 12/06/2017   K 3.8 12/06/2017   CL 105 12/06/2017   CREATININE 0.77 12/06/2017   BUN 19 12/06/2017   CO2 28 12/06/2017   TSH <0.01 (L) 12/06/2017    Lab Results  Component Value Date   TSH <0.01 (L) 12/06/2017   Lab Results  Component Value Date   WBC 4.2 12/06/2017   HGB 12.1 12/06/2017   HCT 36.1 12/06/2017   MCV 84.7 12/06/2017   PLT 245.0 12/06/2017   Lab Results  Component Value Date   NA 141 12/06/2017   K 3.8 12/06/2017   CO2 28 12/06/2017   GLUCOSE 102 (H) 12/06/2017   BUN 19 12/06/2017   CREATININE 0.77 12/06/2017   BILITOT 0.2 12/06/2017   ALKPHOS 88 12/06/2017   AST 19 12/06/2017   ALT 22 12/06/2017   PROT 7.2 12/06/2017   ALBUMIN 4.2 12/06/2017   CALCIUM 9.5 12/06/2017   ANIONGAP 10 06/12/2017   GFR 100.14 12/06/2017   Lab Results  Component Value Date   CHOL 235 (H) 12/06/2017   Lab Results  Component Value Date   HDL 81.90 12/06/2017   Lab Results  Component Value Date   LDLCALC 135  (H) 12/06/2017   Lab Results  Component Value Date   TRIG 90.0 12/06/2017   Lab Results  Component Value Date   CHOLHDL 3 12/06/2017   No results found for: HGBA1C       Assessment & Plan:   Problem List Items Addressed This Visit    Essential hypertension    Improved control on current meds. Renal ultrasound unremarkable. No changes to meds.      Migraine headache    Encouraged increased hydration, 64 ounces of clear fluids daily. Minimize alcohol and caffeine. Eat small frequent meals with lean proteins and complex carbs. Avoid high and low blood sugars. Get adequate sleep, 7-8 hours a night. Needs exercise daily preferably in the morning.      Relevant Medications   cyclobenzaprine (FLEXERIL) 5 MG tablet   Abnormal TSH - Primary    Continue to monitor tsh suppressed but free t4       Relevant Orders   TSH   T4, free   T3, free   Thyroid peroxidase antibody   T3, free   Thyroid peroxidase antibody   Hyperlipidemia    Encouraged heart healthy diet, increase exercise, avoid trans fats, consider a krill oil cap daily         I have discontinued Georganne Osborn's tiZANidine and ZOLMitriptan. I am also having her start on cyclobenzaprine. Additionally, I am having her maintain her olmesartan-hydrochlorothiazide, amLODipine, traZODone, metoprolol tartrate, alendronate, butalbital-acetaminophen-caffeine, and almotriptan.  Meds ordered this encounter  Medications  . cyclobenzaprine (FLEXERIL) 5 MG tablet    Sig: Take 0.5-2 tablets (2.5-10 mg total) by mouth at bedtime as needed for muscle spasms.    Dispense:  30 tablet  Refill:  1    CMA served as Education administrator during this visit. History, Physical and Plan performed by medical provider. Documentation and orders reviewed and attested to.  Penni Homans, MD

## 2017-12-23 ENCOUNTER — Other Ambulatory Visit: Payer: Managed Care, Other (non HMO)

## 2017-12-27 ENCOUNTER — Ambulatory Visit: Payer: Managed Care, Other (non HMO) | Admitting: Cardiovascular Disease

## 2017-12-28 ENCOUNTER — Ambulatory Visit: Payer: Managed Care, Other (non HMO) | Admitting: Medical

## 2017-12-28 DIAGNOSIS — R7989 Other specified abnormal findings of blood chemistry: Secondary | ICD-10-CM | POA: Insufficient documentation

## 2017-12-28 DIAGNOSIS — E785 Hyperlipidemia, unspecified: Secondary | ICD-10-CM | POA: Insufficient documentation

## 2017-12-28 NOTE — Assessment & Plan Note (Signed)
Continue to monitor tsh suppressed but free t4

## 2017-12-28 NOTE — Assessment & Plan Note (Signed)
Encouraged heart healthy diet, increase exercise, avoid trans fats, consider a krill oil cap daily 

## 2017-12-28 NOTE — Assessment & Plan Note (Signed)
Encouraged increased hydration, 64 ounces of clear fluids daily. Minimize alcohol and caffeine. Eat small frequent meals with lean proteins and complex carbs. Avoid high and low blood sugars. Get adequate sleep, 7-8 hours a night. Needs exercise daily preferably in the morning.  

## 2017-12-28 NOTE — Assessment & Plan Note (Signed)
Improved control on current meds. Renal ultrasound unremarkable. No changes to meds.

## 2018-01-04 ENCOUNTER — Encounter (HOSPITAL_BASED_OUTPATIENT_CLINIC_OR_DEPARTMENT_OTHER): Payer: Managed Care, Other (non HMO)

## 2018-01-04 ENCOUNTER — Other Ambulatory Visit: Payer: Managed Care, Other (non HMO)

## 2018-02-08 ENCOUNTER — Telehealth: Payer: Self-pay | Admitting: Family Medicine

## 2018-02-08 NOTE — Telephone Encounter (Signed)
Copied from CRM [id number]. Topic: Quick Communication - See Telephone Encounter >> Feb 08, 2018 11:40 AM Ninfa MeekerPoole, Bridgett H wrote: CRM for notification. See Telephone encounter for: 02/08/18.  Called pt but mailfox was full. Tried to reschedule appt for 02/24/18 with Dr. Abner GreenspanBlyth.

## 2018-02-24 ENCOUNTER — Ambulatory Visit: Payer: Managed Care, Other (non HMO) | Admitting: Family Medicine

## 2018-02-28 ENCOUNTER — Encounter: Payer: Self-pay | Admitting: Family Medicine

## 2018-02-28 ENCOUNTER — Ambulatory Visit (INDEPENDENT_AMBULATORY_CARE_PROVIDER_SITE_OTHER): Payer: Managed Care, Other (non HMO) | Admitting: Family Medicine

## 2018-02-28 VITALS — BP 190/118 | HR 64 | Temp 98.1°F | Resp 18 | Ht 67.0 in | Wt 153.6 lb

## 2018-02-28 DIAGNOSIS — Z23 Encounter for immunization: Secondary | ICD-10-CM | POA: Diagnosis not present

## 2018-02-28 DIAGNOSIS — G43809 Other migraine, not intractable, without status migrainosus: Secondary | ICD-10-CM | POA: Diagnosis not present

## 2018-02-28 DIAGNOSIS — I10 Essential (primary) hypertension: Secondary | ICD-10-CM

## 2018-02-28 DIAGNOSIS — R7989 Other specified abnormal findings of blood chemistry: Secondary | ICD-10-CM

## 2018-02-28 MED ORDER — METOPROLOL TARTRATE 100 MG PO TABS: 100.0000 mg | ORAL_TABLET | Freq: Two times a day (BID) | ORAL | 3 refills | Status: DC

## 2018-02-28 MED ORDER — HYDRALAZINE HCL 25 MG PO TABS: 25.0000 mg | ORAL_TABLET | Freq: Three times a day (TID) | ORAL | 2 refills | Status: DC

## 2018-02-28 MED ORDER — BUTALBITAL-APAP-CAFFEINE 50-325-40 MG PO TABS: ORAL_TABLET | ORAL | 0 refills | Status: DC

## 2018-02-28 MED ORDER — OLMESARTAN MEDOXOMIL 40 MG PO TABS: 40.0000 mg | ORAL_TABLET | Freq: Every day | ORAL | 1 refills | Status: DC

## 2018-02-28 NOTE — Patient Instructions (Signed)

## 2018-03-01 LAB — T4, FREE: Free T4: 1.17 ng/dL (ref 0.60–1.60)

## 2018-03-01 LAB — TSH: TSH: <0.01 u[IU]/mL — ABNORMAL LOW (ref 0.35–4.50)

## 2018-03-01 LAB — T3, FREE: T3, Free: 3.5 pg/mL (ref 2.3–4.2)

## 2018-03-02 LAB — THYROID PEROXIDASE ANTIBODY: Thyroperoxidase Ab SerPl-aCnc: 4 IU/mL (ref <9)

## 2018-03-05 NOTE — Assessment & Plan Note (Signed)
Check labs and ultrasound due to persistently suppressed TSH.

## 2018-03-05 NOTE — Progress Notes (Signed)
Subjective:    Patient ID: Carla West, female    DOB: 15-Aug-1960, 57 y.o.   MRN: [id number]  No chief complaint on file.   HPI Patient is in today for evaluation of persistently elevated blood pressure. She notes it is worse today than when she checks it elsewhere. She endorses some fatigue and occasional headaches but they are tolerable most days. Denies CP/palp/SOB/congestion/fevers/GI or GU c/o. Taking meds as prescribed  Past Medical History:  Diagnosis Date  . Abnormal Pap smear of cervix 08/03/2015  . Benign essential HTN 08/03/2015  . History of chicken pox   . Migraine headache 08/03/2015    Past Surgical History:  Procedure Laterality Date  . TUBAL LIGATION      Family History  Problem Relation Age of Onset  . Hypertension Mother   . Heart disease Mother   . Hyperlipidemia Father   . Hyperlipidemia Sister   . Other Brother        plan crash in [employer]  . Migraines Son   . Heart disease Brother        CHF, arrythmia  . Hypertension Brother   . Gout Brother   . Hypertension Brother     Social History   Socioeconomic History  . Marital status: Single    Spouse name: Not on file  . Number of children: Not on file  . Years of education: Not on file  . Highest education level: Not on file  Occupational History  . Not on file  Social Needs  . Financial resource strain: Not on file  . Food insecurity:    Worry: Not on file    Inability: Not on file  . Transportation needs:    Medical: Not on file    Non-medical: Not on file  Tobacco Use  . Smoking status: Never Smoker  . Smokeless tobacco: Never Used  Substance and Sexual Activity  . Alcohol use: No  . Drug use: No  . 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 Physical activity:    Days per week: Not on file    Minutes per session: Not on file  . Stress: Not on file  Relationships  . Social connections:      Talks on phone: Not on file    Gets together: Not on file    Attends religious service: Not on file    Active member of club or organization: Not on file    Attends meetings of clubs or organizations: Not on file    Relationship status: Not on file  . Intimate partner violence:    Fear of current or ex partner: Not on file    Emotionally abused: Not on file    Physically abused: Not on file    Forced sexual activity: Not on file  Other Topics Concern  . Not on file  Social History Narrative  . Not on file    Outpatient Medications Prior to Visit  Medication Sig Dispense Refill  . alendronate (FOSAMAX) 70 MG tablet Take 1 tablet (70 mg total) by mouth every 7 (seven) days. Take with a full glass of water on an empty stomach. 4 tablet 11  . almotriptan (AXERT) 12.5 MG tablet Take 1 tablet (12.5 mg total) by mouth 2 (two) times daily as needed for migraine. may repeat in 2 hours if needed 10 tablet 0  . amLODipine (NORVASC) 10 MG tablet Take 1  tablet (10 mg total) by mouth daily. 30 tablet 5  . cyclobenzaprine (FLEXERIL) 5 MG tablet Take 0.5-2 tablets (2.5-10 mg total) by mouth at bedtime as needed for muscle spasms. 30 tablet 1  . traZODone (DESYREL) 50 MG tablet Take 0.5-1 tablets (25-50 mg total) by mouth at bedtime as needed for sleep. 30 tablet 5  . butalbital-acetaminophen-caffeine (FIORICET, ESGIC) 50-325-40 MG tablet TAKE 1 TABLET BY MOUTH EVERY 6 HOURS AS NEEDED FOR HEADACHE 30 tablet 0  . metoprolol tartrate (LOPRESSOR) 100 MG tablet Take 1 tablet (100 mg total) by mouth 2 (two) times daily. 60 tablet 3  . olmesartan-hydrochlorothiazide (BENICAR HCT) 40-25 MG tablet Take 1 tablet by mouth daily. 30 tablet 5   No facility-administered medications prior to visit.     Allergies  Allergen Reactions  . Elavil [Amitriptyline Hcl]     Review of Systems  Constitutional: Positive for malaise/fatigue. Negative for fever.  HENT: Negative for congestion.   Eyes: Negative for  blurred vision.  Respiratory: Negative for shortness of breath.   Cardiovascular: Negative for chest pain, palpitations and leg swelling.  Gastrointestinal: Negative for abdominal pain, blood in stool and nausea.  Genitourinary: Negative for dysuria and frequency.  Musculoskeletal: Negative for falls.  Skin: Negative for rash.  Neurological: Positive for headaches. Negative for dizziness and loss of consciousness.  Endo/Heme/Allergies: Negative for environmental allergies.  Psychiatric/Behavioral: Negative for depression. The patient is not nervous/anxious.        Objective:    Physical Exam  Constitutional: She is oriented to person, place, and time. She appears well-developed and well-nourished. No distress.  HENT:  Head: Normocephalic and atraumatic.  Nose: Nose normal.  Eyes: Right eye exhibits no discharge. Left eye exhibits no discharge.  Neck: Normal range of motion. Neck supple.  Cardiovascular: Normal rate and regular rhythm.  No murmur heard. Pulmonary/Chest: Effort normal and breath sounds normal.  Abdominal: Soft. Bowel sounds are normal. There is no tenderness.  Musculoskeletal: She exhibits no edema.  Neurological: She is alert and oriented to person, place, and time.  Skin: Skin is warm and dry.  Psychiatric: She has a normal mood and affect.  Nursing note and vitals reviewed.   BP (!) 190/118 (BP Location: Left Arm, Patient Position: Sitting, Cuff Size: Normal)   Pulse 64   Temp 98.1 F (36.7 C) (Oral)   Resp 18   Ht 5\' 7"  (1.702 m)   Wt 153 lb 9.6 oz (69.7 kg)   SpO2 97%   BMI 24.06 kg/m  Wt Readings from Last 3 Encounters:  02/28/18 153 lb 9.6 oz (69.7 kg)  12/22/17 150 lb 9.6 oz (68.3 kg)  12/06/17 153 lb 9.6 oz (69.7 kg)     Lab Results  Component Value Date   WBC 4.2 12/06/2017   HGB 12.1 12/06/2017   HCT 36.1 12/06/2017   PLT 245.0 12/06/2017   GLUCOSE 102 (H) 12/06/2017   CHOL 235 (H) 12/06/2017   TRIG 90.0 12/06/2017   HDL 81.90  12/06/2017   LDLCALC 135 (H) 12/06/2017   ALT 22 12/06/2017   AST 19 12/06/2017   NA 141 12/06/2017   K 3.8 12/06/2017   CL 105 12/06/2017   CREATININE 0.77 12/06/2017   BUN 19 12/06/2017   CO2 28 12/06/2017   TSH <0.01 (L) 02/28/2018    Lab Results  Component Value Date   TSH <0.01 (L) 02/28/2018   Lab Results  Component Value Date   WBC 4.2 12/06/2017   HGB 12.1 12/06/2017  HCT 36.1 12/06/2017   MCV 84.7 12/06/2017   PLT 245.0 12/06/2017   Lab Results  Component Value Date   NA 141 12/06/2017   K 3.8 12/06/2017   CO2 28 12/06/2017   GLUCOSE 102 (H) 12/06/2017   BUN 19 12/06/2017   CREATININE 0.77 12/06/2017   BILITOT 0.2 12/06/2017   ALKPHOS 88 12/06/2017   AST 19 12/06/2017   ALT 22 12/06/2017   PROT 7.2 12/06/2017   ALBUMIN 4.2 12/06/2017   CALCIUM 9.5 12/06/2017   ANIONGAP 10 06/12/2017   GFR 100.14 12/06/2017   Lab Results  Component Value Date   CHOL 235 (H) 12/06/2017   Lab Results  Component Value Date   HDL 81.90 12/06/2017   Lab Results  Component Value Date   LDLCALC 135 (H) 12/06/2017   Lab Results  Component Value Date   TRIG 90.0 12/06/2017   Lab Results  Component Value Date   CHOLHDL 3 12/06/2017   No results found for: HGBA1C     Assessment & Plan:   Problem List Items Addressed This Visit    Essential hypertension    Persistently elevated and higher than usual today. Will add Hydralazine 25 mg tid      Relevant Medications   metoprolol tartrate (LOPRESSOR) 100 MG tablet   hydrALAZINE (APRESOLINE) 25 MG tablet   olmesartan (BENICAR) 40 MG tablet   Migraine headache    Encouraged increased hydration, 64 ounces of clear fluids daily. Minimize alcohol and caffeine. Eat small frequent meals with lean proteins and complex carbs. Avoid high and low blood sugars. Get adequate sleep, 7-8 hours a night. Needs exercise daily preferably in the morning.      Relevant Medications   metoprolol tartrate (LOPRESSOR) 100 MG tablet    butalbital-acetaminophen-caffeine (FIORICET, ESGIC) 50-325-40 MG tablet   hydrALAZINE (APRESOLINE) 25 MG tablet   olmesartan (BENICAR) 40 MG tablet   Abnormal TSH    Check labs and ultrasound due to persistently suppressed TSH.        Other Visit Diagnoses    Needs flu shot    -  Primary   Relevant Orders   Flu Vaccine QUAD 6+ mos PF IM (Fluarix Quad PF) (Completed)      I have discontinued Velna HatchetSheila Schul's olmesartan-hydrochlorothiazide. I am also having her start on hydrALAZINE and olmesartan. Additionally, I am having her maintain her amLODipine, traZODone, alendronate, almotriptan, cyclobenzaprine, metoprolol tartrate, and butalbital-acetaminophen-caffeine.  Meds ordered this encounter  Medications  . DISCONTD: butalbital-acetaminophen-caffeine (FIORICET, ESGIC) 50-325-40 MG tablet    Sig: TAKE 1 TABLET BY MOUTH EVERY 6 HOURS AS NEEDED FOR HEADACHE    Dispense:  30 tablet    Refill:  0  . metoprolol tartrate (LOPRESSOR) 100 MG tablet    Sig: Take 1 tablet (100 mg total) by mouth 2 (two) times daily.    Dispense:  60 tablet    Refill:  3  . butalbital-acetaminophen-caffeine (FIORICET, ESGIC) 50-325-40 MG tablet    Sig: TAKE 1 TABLET BY MOUTH EVERY 6 HOURS AS NEEDED FOR HEADACHE    Dispense:  30 tablet    Refill:  0  . hydrALAZINE (APRESOLINE) 25 MG tablet    Sig: Take 1 tablet (25 mg total) by mouth 3 (three) times daily.    Dispense:  90 tablet    Refill:  2  . olmesartan (BENICAR) 40 MG tablet    Sig: Take 1 tablet (40 mg total) by mouth daily.    Dispense:  90 tablet  Refill:  1     Penni Homans, MD

## 2018-03-05 NOTE — Assessment & Plan Note (Signed)
Encouraged increased hydration, 64 ounces of clear fluids daily. Minimize alcohol and caffeine. Eat small frequent meals with lean proteins and complex carbs. Avoid high and low blood sugars. Get adequate sleep, 7-8 hours a night. Needs exercise daily preferably in the morning.  

## 2018-03-05 NOTE — Assessment & Plan Note (Signed)
Persistently elevated and higher than usual today. Will add Hydralazine 25 mg tid

## 2018-04-04 ENCOUNTER — Other Ambulatory Visit: Payer: Self-pay | Admitting: Family Medicine

## 2018-04-04 DIAGNOSIS — Z299 Encounter for prophylactic measures, unspecified: Secondary | ICD-10-CM

## 2018-04-04 DIAGNOSIS — E785 Hyperlipidemia, unspecified: Secondary | ICD-10-CM

## 2018-04-04 DIAGNOSIS — R7989 Other specified abnormal findings of blood chemistry: Secondary | ICD-10-CM

## 2018-04-04 DIAGNOSIS — I10 Essential (primary) hypertension: Secondary | ICD-10-CM

## 2018-04-07 ENCOUNTER — Other Ambulatory Visit: Payer: Managed Care, Other (non HMO)

## 2018-04-11 ENCOUNTER — Other Ambulatory Visit (INDEPENDENT_AMBULATORY_CARE_PROVIDER_SITE_OTHER): Payer: Managed Care, Other (non HMO)

## 2018-04-11 ENCOUNTER — Encounter: Payer: Self-pay | Admitting: Family Medicine

## 2018-04-11 ENCOUNTER — Ambulatory Visit (INDEPENDENT_AMBULATORY_CARE_PROVIDER_SITE_OTHER): Payer: Managed Care, Other (non HMO) | Admitting: Family Medicine

## 2018-04-11 ENCOUNTER — Other Ambulatory Visit (HOSPITAL_COMMUNITY)
Admission: RE | Admit: 2018-04-11 | Discharge: 2018-04-11 | Disposition: A | Discharge status: Home / Self Care | Payer: Managed Care, Other (non HMO) | Source: Ambulatory Visit | Attending: Family Medicine | Admitting: Family Medicine

## 2018-04-11 ENCOUNTER — Other Ambulatory Visit: Payer: Self-pay | Admitting: Family Medicine

## 2018-04-11 VITALS — BP 140/92 | HR 67 | Temp 98.3°F | Resp 18 | Ht 67.0 in | Wt 152.2 lb

## 2018-04-11 DIAGNOSIS — Z124 Encounter for screening for malignant neoplasm of cervix: Secondary | ICD-10-CM | POA: Insufficient documentation

## 2018-04-11 DIAGNOSIS — G43809 Other migraine, not intractable, without status migrainosus: Secondary | ICD-10-CM | POA: Diagnosis not present

## 2018-04-11 DIAGNOSIS — Z0000 Encounter for general adult medical examination without abnormal findings: Secondary | ICD-10-CM

## 2018-04-11 DIAGNOSIS — R002 Palpitations: Secondary | ICD-10-CM | POA: Insufficient documentation

## 2018-04-11 DIAGNOSIS — M8580 Other specified disorders of bone density and structure, unspecified site: Secondary | ICD-10-CM

## 2018-04-11 DIAGNOSIS — R87619 Unspecified abnormal cytological findings in specimens from cervix uteri: Secondary | ICD-10-CM

## 2018-04-11 DIAGNOSIS — R7989 Other specified abnormal findings of blood chemistry: Secondary | ICD-10-CM

## 2018-04-11 DIAGNOSIS — I10 Essential (primary) hypertension: Secondary | ICD-10-CM

## 2018-04-11 DIAGNOSIS — M79674 Pain in right toe(s): Secondary | ICD-10-CM | POA: Insufficient documentation

## 2018-04-11 DIAGNOSIS — E785 Hyperlipidemia, unspecified: Secondary | ICD-10-CM

## 2018-04-11 DIAGNOSIS — Z1239 Encounter for other screening for malignant neoplasm of breast: Secondary | ICD-10-CM

## 2018-04-11 DIAGNOSIS — Z1211 Encounter for screening for malignant neoplasm of colon: Secondary | ICD-10-CM

## 2018-04-11 DIAGNOSIS — M542 Cervicalgia: Secondary | ICD-10-CM

## 2018-04-11 LAB — CBC
HCT: 38.1 % (ref 36.0–46.0)
Hemoglobin: 12.8 g/dL (ref 12.0–15.0)
MCHC: 33.5 g/dL (ref 30.0–36.0)
MCV: 87 fl (ref 78.0–100.0)
Platelets: 210 10*3/uL (ref 150.0–400.0)
RBC: 4.38 Mil/uL (ref 3.87–5.11)
RDW: 14.1 % (ref 11.5–15.5)
WBC: 4.3 10*3/uL (ref 4.0–10.5)

## 2018-04-11 LAB — COMPREHENSIVE METABOLIC PANEL
ALT: 26 U/L (ref 0–35)
AST: 21 U/L (ref 0–37)
Albumin: 4.1 g/dL (ref 3.5–5.2)
Alkaline Phosphatase: 92 U/L (ref 39–117)
BUN: 19 mg/dL (ref 6–23)
CO2: 28 mEq/L (ref 19–32)
Calcium: 9.1 mg/dL (ref 8.4–10.5)
Chloride: 105 mEq/L (ref 96–112)
Creatinine, Ser: 0.88 mg/dL (ref 0.40–1.20)
GFR: 85.73 mL/min (ref >60.00)
Glucose, Bld: 71 mg/dL (ref 70–99)
Potassium: 3.8 mEq/L (ref 3.5–5.1)
Sodium: 140 mEq/L (ref 135–145)
Total Bilirubin: 0.4 mg/dL (ref 0.2–1.2)
Total Protein: 6.5 g/dL (ref 6.0–8.3)

## 2018-04-11 LAB — TSH: TSH: <0.01 u[IU]/mL — ABNORMAL LOW (ref 0.35–4.50)

## 2018-04-11 LAB — T4, FREE: Free T4: 1.07 ng/dL (ref 0.60–1.60)

## 2018-04-11 LAB — LIPID PANEL
Cholesterol: 228 mg/dL — ABNORMAL HIGH (ref 0–200)
HDL: 80.6 mg/dL (ref >39.00)
LDL Cholesterol: 133 mg/dL — ABNORMAL HIGH (ref 0–99)
NonHDL: 147.45
Total CHOL/HDL Ratio: 3
Triglycerides: 70 mg/dL (ref 0.0–149.0)
VLDL: 14 mg/dL (ref 0.0–40.0)

## 2018-04-11 LAB — URIC ACID: Uric Acid, Serum: 4 mg/dL (ref 2.4–7.0)

## 2018-04-11 MED ORDER — ALMOTRIPTAN MALATE 12.5 MG PO TABS: 12.5000 mg | ORAL_TABLET | Freq: Two times a day (BID) | ORAL | 0 refills | Status: DC | PRN

## 2018-04-11 MED ORDER — CYCLOBENZAPRINE HCL 5 MG PO TABS: 2.5000 mg | ORAL_TABLET | Freq: Every evening | ORAL | 1 refills | Status: DC | PRN

## 2018-04-11 MED ORDER — OLMESARTAN MEDOXOMIL 40 MG PO TABS: 40.0000 mg | ORAL_TABLET | Freq: Every day | ORAL | 1 refills | Status: DC

## 2018-04-11 MED ORDER — METOPROLOL TARTRATE 100 MG PO TABS: 100.0000 mg | ORAL_TABLET | Freq: Two times a day (BID) | ORAL | 3 refills | Status: DC

## 2018-04-11 MED ORDER — AMLODIPINE BESYLATE 10 MG PO TABS: 10.0000 mg | ORAL_TABLET | Freq: Every day | ORAL | 5 refills | Status: DC

## 2018-04-11 MED ORDER — BUTALBITAL-APAP-CAFFEINE 50-325-40 MG PO TABS: ORAL_TABLET | ORAL | 0 refills | Status: DC

## 2018-04-11 MED ORDER — HYDRALAZINE HCL 25 MG PO TABS: 25.0000 mg | ORAL_TABLET | Freq: Three times a day (TID) | ORAL | 2 refills | Status: DC

## 2018-04-11 MED ORDER — AMITRIPTYLINE HCL 10 MG PO TABS: 10.0000 mg | ORAL_TABLET | Freq: Every day | ORAL | 3 refills | Status: DC

## 2018-04-11 MED ORDER — ALENDRONATE SODIUM 70 MG PO TABS: 70.0000 mg | ORAL_TABLET | ORAL | 11 refills | Status: DC

## 2018-04-11 NOTE — Assessment & Plan Note (Signed)
Encouraged heart healthy diet, increase exercise, avoid trans fats, consider a krill oil cap daily 

## 2018-04-11 NOTE — Assessment & Plan Note (Signed)
Notes several months of slowly worsening pain in toe #1, 2, 3 on right no shoes or time of day matter. Check uric acid too rule out gout. Try Lidocaine gel if worsens consider podiatry

## 2018-04-11 NOTE — Assessment & Plan Note (Addendum)
Occurs intermittently, racing gets sweaty at times. Occurs daily. Happens at night. Will refer to cardiology due to LVH and resisant HTN this past year as well

## 2018-04-11 NOTE — Assessment & Plan Note (Signed)
Well controlled, no changes to meds. Encouraged heart healthy diet such as the DASH diet and exercise as tolerated. improving

## 2018-04-11 NOTE — Assessment & Plan Note (Signed)
monitor

## 2018-04-11 NOTE — Assessment & Plan Note (Addendum)
Patient encouraged to maintain heart healthy diet, regular exercise, adequate sleep. Consider daily probiotics. Take medications as prescribed. Given and reviewed copy of ACP documents from [place] Secretary of State and encouraged to complete and return 

## 2018-04-11 NOTE — Patient Instructions (Signed)
Shingrix is the new chicken pox shot, 2 shots over 2-6 months, check with insurance and confirm coverage and call for RN visit.  Preventive Care 40-64 Years, Female Preventive care refers to lifestyle choices and visits with your health care provider that can promote health and wellness. What does preventive care include?  A yearly physical exam. This is also called an annual well check.  Dental exams once or twice a year.  Routine eye exams. Ask your health care provider how often you should have your eyes checked.  Personal lifestyle choices, including: ? Daily care of your teeth and gums. ? Regular physical activity. ? Eating a healthy diet. ? Avoiding tobacco and drug use. ? Limiting alcohol use. ? Practicing safe sex. ? Taking low-dose aspirin daily starting at age 11. ? Taking vitamin and mineral supplements as recommended by your health care provider. What happens during an annual well check? The services and screenings done by your health care provider during your annual well check will depend on your age, overall health, lifestyle risk factors, and family history of disease. Counseling Your health care provider may ask you questions about your:  Alcohol use.  Tobacco use.  Drug use.  Emotional well-being.  Home and relationship well-being.  Sexual activity.  Eating habits.  Work and work Statistician.  Method of birth control.  Menstrual cycle.  Pregnancy history.  Screening You may have the following tests or measurements:  Height, weight, and BMI.  Blood pressure.  Lipid and cholesterol levels. These may be checked every 5 years, or more frequently if you are over 51 years old.  Skin check.  Lung cancer screening. You may have this screening every year starting at age 82 if you have a 30-pack-year history of smoking and currently smoke or have quit within the past 15 years.  Fecal occult blood test (FOBT) of the stool. You may have this test every  year starting at age 69.  Flexible sigmoidoscopy or colonoscopy. You may have a sigmoidoscopy every 5 years or a colonoscopy every 10 years starting at age 19.  Hepatitis C blood test.  Hepatitis B blood test.  Sexually transmitted disease (STD) testing.  Diabetes screening. This is done by checking your blood sugar (glucose) after you have not eaten for a while (fasting). You may have this done every 1-3 years.  Mammogram. This may be done every 1-2 years. Talk to your health care provider about when you should start having regular mammograms. This may depend on whether you have a family history of breast cancer.  BRCA-related cancer screening. This may be done if you have a family history of breast, ovarian, tubal, or peritoneal cancers.  Pelvic exam and Pap test. This may be done every 3 years starting at age 73. Starting at age 14, this may be done every 5 years if you have a Pap test in combination with an HPV test.  Bone density scan. This is done to screen for osteoporosis. You may have this scan if you are at high risk for osteoporosis.  Discuss your test results, treatment options, and if necessary, the need for more tests with your health care provider. Vaccines Your health care provider may recommend certain vaccines, such as:  Influenza vaccine. This is recommended every year.  Tetanus, diphtheria, and acellular pertussis (Tdap, Td) vaccine. You may need a Td booster every 10 years.  Varicella vaccine. You may need this if you have not been vaccinated.  Zoster vaccine. You may need this  after age 14.  Measles, mumps, and rubella (MMR) vaccine. You may need at least one dose of MMR if you were born in 1957 or later. You may also need a second dose.  Pneumococcal 13-valent conjugate (PCV13) vaccine. You may need this if you have certain conditions and were not previously vaccinated.  Pneumococcal polysaccharide (PPSV23) vaccine. You may need one or two doses if you smoke  cigarettes or if you have certain conditions.  Meningococcal vaccine. You may need this if you have certain conditions.  Hepatitis A vaccine. You may need this if you have certain conditions or if you travel or work in places where you may be exposed to hepatitis A.  Hepatitis B vaccine. You may need this if you have certain conditions or if you travel or work in places where you may be exposed to hepatitis B.  Haemophilus influenzae type b (Hib) vaccine. You may need this if you have certain conditions.  Talk to your health care provider about which screenings and vaccines you need and how often you need them. This information is not intended to replace advice given to you by your health care provider. Make sure you discuss any questions you have with your health care provider. Document Released: 06/27/2015 Document Revised: 02/18/2016 Document Reviewed: 04/01/2015 Elsevier Interactive Patient Education  [employer].

## 2018-04-11 NOTE — Assessment & Plan Note (Signed)
Pap today, no concerns on exam.  

## 2018-04-11 NOTE — Assessment & Plan Note (Signed)
Encouraged to get adequate exercise, calcium and vitamin d intake 

## 2018-04-11 NOTE — Progress Notes (Signed)
Subjective:    Patient ID: Carla West, female    DOB: 1960/07/27, 57 y.o.   MRN: [id number]  No chief complaint on file.   HPI Patient is in today for annual preventative exam and follow-up on chronic medical concerns including hypertension and hyperlipidemia.  She notes some acute concerns today.  One is an increase in pain in 3 toes on her right foot.  She denies any injury or trauma.  No redness, warmth but she has pain in toes #1 2 and 3 on the right foot for the last several months.  She notes the trazodone is not helping well for sleep and makes her feel dizzy he is when she takes it so she has not been taking it.  She has trouble falling and staying asleep.  No recent febrile illness or acute hospitalizations.  Notes her blood pressure continues to run tween 135 and 160 systolic over 80s and 90s.  Overall she is feeling better although she has begun to notice increase in palpitations.  At this point she has palpitations multiple times each day and on occasion she can get short of breath with these episodes.  She is under a great deal of stress at work and at home but is unclear if this is the trigger or not.  She is managing her activities of daily living well.  She continues to work full-time. Denies SOB/HA/congestion/fevers/GI or GU c/o. Taking meds as prescribed  Past Medical History:  Diagnosis Date  . Abnormal Pap smear of cervix 08/03/2015  . Benign essential HTN 08/03/2015  . History of chicken pox   . Migraine headache 08/03/2015    Past Surgical History:  Procedure Laterality Date  . TUBAL LIGATION      Family History  Problem Relation Age of Onset  . Hypertension Mother   . Heart disease Mother   . Hyperlipidemia Father   . Hyperlipidemia Sister   . Other Brother        plan crash in [employer]  . Migraines Son   . Heart disease Brother        CHF, arrythmia  . Hypertension Brother   . Gout Brother   . Hypertension Brother     Social History    Socioeconomic History  . Marital status: Single    Spouse name: Not on file  . Number of children: Not on file  . Years of education: Not on file  . Highest education level: Not on file  Occupational History  . Not on file  Social Needs  . Financial resource strain: Not on file  . Food insecurity:    Worry: Not on file    Inability: Not on file  . Transportation needs:    Medical: Not on file    Non-medical: Not on file  Tobacco Use  . Smoking status: Never Smoker  . Smokeless tobacco: Never Used  Substance and Sexual Activity  . Alcohol use: No  . Drug use: No  . 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 Physical activity:    Days per week: Not on file    Minutes per session: Not on file  . Stress: Not on file  Relationships  . Social connections:    Talks on phone: Not on file    Gets together: Not on file    Attends religious service: Not on file    Active member of club or  organization: Not on file    Attends meetings of clubs or organizations: Not on file    Relationship status: Not on file  . Intimate partner violence:    Fear of current or ex partner: Not on file    Emotionally abused: Not on file    Physically abused: Not on file    Forced sexual activity: Not on file  Other Topics Concern  . Not on file  Social History Narrative  . Not on file    Outpatient Medications Prior to Visit  Medication Sig Dispense Refill  . traZODone (DESYREL) 50 MG tablet Take 0.5-1 tablets (25-50 mg total) by mouth at bedtime as needed for sleep. 30 tablet 5  . alendronate (FOSAMAX) 70 MG tablet Take 1 tablet (70 mg total) by mouth every 7 (seven) days. Take with a full glass of water on an empty stomach. 4 tablet 11  . almotriptan (AXERT) 12.5 MG tablet Take 1 tablet (12.5 mg total) by mouth 2 (two) times daily as needed for migraine. may repeat in 2 hours if needed 10 tablet 0  .  amLODipine (NORVASC) 10 MG tablet Take 1 tablet (10 mg total) by mouth daily. 30 tablet 5  . butalbital-acetaminophen-caffeine (FIORICET, ESGIC) 50-325-40 MG tablet TAKE 1 TABLET BY MOUTH EVERY 6 HOURS AS NEEDED FOR HEADACHE 30 tablet 0  . cyclobenzaprine (FLEXERIL) 5 MG tablet Take 0.5-2 tablets (2.5-10 mg total) by mouth at bedtime as needed for muscle spasms. 30 tablet 1  . hydrALAZINE (APRESOLINE) 25 MG tablet Take 1 tablet (25 mg total) by mouth 3 (three) times daily. 90 tablet 2  . metoprolol tartrate (LOPRESSOR) 100 MG tablet Take 1 tablet (100 mg total) by mouth 2 (two) times daily. 60 tablet 3  . olmesartan (BENICAR) 40 MG tablet Take 1 tablet (40 mg total) by mouth daily. 90 tablet 1   No facility-administered medications prior to visit.     Allergies  Allergen Reactions  . Elavil [Amitriptyline Hcl]     Review of Systems  Constitutional: Negative for fever and malaise/fatigue.  HENT: Negative for congestion.   Eyes: Negative for blurred vision.  Respiratory: Positive for shortness of breath.   Cardiovascular: Positive for palpitations. Negative for chest pain and leg swelling.  Gastrointestinal: Negative for abdominal pain, blood in stool and nausea.  Genitourinary: Negative for dysuria and frequency.  Musculoskeletal: Negative for falls.  Skin: Negative for rash.  Neurological: Negative for dizziness, loss of consciousness and headaches.  Endo/Heme/Allergies: Negative for environmental allergies.  Psychiatric/Behavioral: Negative for depression. The patient has insomnia. The patient is not nervous/anxious.        Objective:    Physical Exam  Constitutional: She is oriented to person, place, and time. No distress.  HENT:  Head: Normocephalic and atraumatic.  Right Ear: External ear normal.  Left Ear: External ear normal.  Nose: Nose normal.  Mouth/Throat: Oropharynx is clear and moist. No oropharyngeal exudate.  Eyes: Pupils are equal, round, and reactive to light.  Conjunctivae are normal. Right eye exhibits no discharge. Left eye exhibits no discharge. No scleral icterus.  Neck: Normal range of motion. Neck supple. No thyromegaly present.  Cardiovascular: Normal rate, regular rhythm, normal heart sounds and intact distal pulses.  No murmur heard. Pulmonary/Chest: Effort normal and breath sounds normal. No respiratory distress. She has no wheezes. She has no rales.  Abdominal: Soft. Bowel sounds are normal. She exhibits no distension and no mass. There is no tenderness.  Musculoskeletal: Normal range of motion. She  exhibits no edema or tenderness.  Lymphadenopathy:    She has no cervical adenopathy.  Neurological: She is alert and oriented to person, place, and time. She has normal reflexes. She displays normal reflexes. No cranial nerve deficit. Coordination normal.  Skin: Skin is warm and dry. No rash noted. She is not diaphoretic.    BP (!) 138/98 (BP Location: Left Arm, Patient Position: Sitting, Cuff Size: Normal)   Pulse 67   Temp 98.3 F (36.8 C) (Oral)   Resp 18   Ht 5\' 7"  (1.702 m)   Wt 152 lb 3.2 oz (69 kg)   SpO2 98%   BMI 23.84 kg/m  Wt Readings from Last 3 Encounters:  04/11/18 152 lb 3.2 oz (69 kg)  02/28/18 153 lb 9.6 oz (69.7 kg)  12/22/17 150 lb 9.6 oz (68.3 kg)     Lab Results  Component Value Date   WBC 4.2 12/06/2017   HGB 12.1 12/06/2017   HCT 36.1 12/06/2017   PLT 245.0 12/06/2017   GLUCOSE 102 (H) 12/06/2017   CHOL 235 (H) 12/06/2017   TRIG 90.0 12/06/2017   HDL 81.90 12/06/2017   LDLCALC 135 (H) 12/06/2017   ALT 22 12/06/2017   AST 19 12/06/2017   NA 141 12/06/2017   K 3.8 12/06/2017   CL 105 12/06/2017   CREATININE 0.77 12/06/2017   BUN 19 12/06/2017   CO2 28 12/06/2017   TSH <0.01 (L) 02/28/2018    Lab Results  Component Value Date   TSH <0.01 (L) 02/28/2018   Lab Results  Component Value Date   WBC 4.2 12/06/2017   HGB 12.1 12/06/2017   HCT 36.1 12/06/2017   MCV 84.7 12/06/2017   PLT 245.0  12/06/2017   Lab Results  Component Value Date   NA 141 12/06/2017   K 3.8 12/06/2017   CO2 28 12/06/2017   GLUCOSE 102 (H) 12/06/2017   BUN 19 12/06/2017   CREATININE 0.77 12/06/2017   BILITOT 0.2 12/06/2017   ALKPHOS 88 12/06/2017   AST 19 12/06/2017   ALT 22 12/06/2017   PROT 7.2 12/06/2017   ALBUMIN 4.2 12/06/2017   CALCIUM 9.5 12/06/2017   ANIONGAP 10 06/12/2017   GFR 100.14 12/06/2017   Lab Results  Component Value Date   CHOL 235 (H) 12/06/2017   Lab Results  Component Value Date   HDL 81.90 12/06/2017   Lab Results  Component Value Date   LDLCALC 135 (H) 12/06/2017   Lab Results  Component Value Date   TRIG 90.0 12/06/2017   Lab Results  Component Value Date   CHOLHDL 3 12/06/2017   No results found for: HGBA1C     Assessment & Plan:   Problem List Items Addressed This Visit    Essential hypertension    Well controlled, no changes to meds. Encouraged heart healthy diet such as the DASH diet and exercise as tolerated. improving      Relevant Medications   olmesartan (BENICAR) 40 MG tablet   metoprolol tartrate (LOPRESSOR) 100 MG tablet   amLODipine (NORVASC) 10 MG tablet   hydrALAZINE (APRESOLINE) 25 MG tablet   Other Relevant Orders   Ambulatory referral to Cardiology   CBC   Comprehensive metabolic panel   TSH   Migraine headache   Relevant Medications   butalbital-acetaminophen-caffeine (FIORICET, ESGIC) 50-325-40 MG tablet   olmesartan (BENICAR) 40 MG tablet   metoprolol tartrate (LOPRESSOR) 100 MG tablet   amLODipine (NORVASC) 10 MG tablet   cyclobenzaprine (FLEXERIL) 5 MG tablet  almotriptan (AXERT) 12.5 MG tablet   hydrALAZINE (APRESOLINE) 25 MG tablet   Abnormal Pap smear of cervix    Pap today, no concerns on exam.       Preventative health care    Patient encouraged to maintain heart healthy diet, regular exercise, adequate sleep. Consider daily probiotics. Take medications as prescribed. Given and reviewed copy of ACP  documents from [employer] and encouraged to complete and return      RESOLVED: Osteopenia    Encouraged to get adequate exercise, calcium and vitamin d intake      Abnormal TSH    monitor      Hyperlipidemia    Encouraged heart healthy diet, increase exercise, avoid trans fats, consider a krill oil cap daily      Relevant Medications   olmesartan (BENICAR) 40 MG tablet   metoprolol tartrate (LOPRESSOR) 100 MG tablet   amLODipine (NORVASC) 10 MG tablet   hydrALAZINE (APRESOLINE) 25 MG tablet   Other Relevant Orders   Lipid panel   Palpitations    Occurs intermittently, racing gets sweaty at times. Occurs daily. Happens at night. Will refer to cardiology due to LVH and resisant HTN this past year as well      Relevant Orders   Ambulatory referral to Cardiology   Pain in right toe(s)    Notes several months of slowly worsening pain in toe #1, 2, 3 on right no shoes or time of day matter. Check uric acid too rule out gout. Try Lidocaine gel if worsens consider podiatry      Relevant Orders   Uric acid    Other Visit Diagnoses    Cervical cancer screening    -  Primary   Relevant Orders   Cytology - PAP( [place])   Neck pain          I am having Unknown Foley maintain her traZODone, butalbital-acetaminophen-caffeine, olmesartan, metoprolol tartrate, alendronate, amLODipine, cyclobenzaprine, almotriptan, and hydrALAZINE.  Meds ordered this encounter  Medications  . butalbital-acetaminophen-caffeine (FIORICET, ESGIC) 50-325-40 MG tablet    Sig: TAKE 1 TABLET BY MOUTH EVERY 6 HOURS AS NEEDED FOR HEADACHE    Dispense:  30 tablet    Refill:  0  . olmesartan (BENICAR) 40 MG tablet    Sig: Take 1 tablet (40 mg total) by mouth daily.    Dispense:  90 tablet    Refill:  1  . metoprolol tartrate (LOPRESSOR) 100 MG tablet    Sig: Take 1 tablet (100 mg total) by mouth 2 (two) times daily.    Dispense:  60 tablet    Refill:  3  . alendronate (FOSAMAX) 70 MG  tablet    Sig: Take 1 tablet (70 mg total) by mouth every 7 (seven) days. Take with a full glass of water on an empty stomach.    Dispense:  4 tablet    Refill:  11  . amLODipine (NORVASC) 10 MG tablet    Sig: Take 1 tablet (10 mg total) by mouth daily.    Dispense:  30 tablet    Refill:  5  . cyclobenzaprine (FLEXERIL) 5 MG tablet    Sig: Take 0.5-2 tablets (2.5-10 mg total) by mouth at bedtime as needed for muscle spasms.    Dispense:  30 tablet    Refill:  1  . almotriptan (AXERT) 12.5 MG tablet    Sig: Take 1 tablet (12.5 mg total) by mouth 2 (two) times daily as needed for migraine. may  repeat in 2 hours if needed    Dispense:  10 tablet    Refill:  0  . hydrALAZINE (APRESOLINE) 25 MG tablet    Sig: Take 1 tablet (25 mg total) by mouth 3 (three) times daily.    Dispense:  90 tablet    Refill:  2     Danise Edge, MD

## 2018-04-12 LAB — CYTOLOGY - PAP: Diagnosis: NEGATIVE

## 2018-04-12 NOTE — Telephone Encounter (Signed)
Author phoned pt. To review lab results from 10/29 and relay Dr. Mariel Aloe message. No answer, VM full. Chartered loss adjuster sent [employer].

## 2018-04-18 ENCOUNTER — Telehealth: Payer: Self-pay

## 2018-04-18 NOTE — Telephone Encounter (Signed)
She is ovedue for Mammogram so hoping to image both beasts but her density with tenderness is in right axilla

## 2018-04-18 NOTE — Telephone Encounter (Signed)
Please advised which breast

## 2018-04-18 NOTE — Telephone Encounter (Signed)
Author called pt. to follow up on indication for mammogram as ordered by Dr. Abner Greenspan, as [facility] had called author this AM to see which breast (or both?) were of concern for lumps. No answer on pt's home/cell, unable to leave VM. Routed to [place], [place] to contact [facility] center at [phone] to confirm with Tanya.

## 2018-04-19 ENCOUNTER — Other Ambulatory Visit: Payer: Self-pay | Admitting: Family Medicine

## 2018-04-19 DIAGNOSIS — M79621 Pain in right upper arm: Secondary | ICD-10-CM

## 2018-04-19 NOTE — Telephone Encounter (Signed)
LMOM for Carla West w/ information. Instructed to call back if questions/concerns.

## 2018-04-20 ENCOUNTER — Emergency Department (HOSPITAL_BASED_OUTPATIENT_CLINIC_OR_DEPARTMENT_OTHER)
Admission: EM | Admit: 2018-04-20 | Discharge: 2018-04-20 | Disposition: A | Discharge status: Home / Self Care | Payer: Managed Care, Other (non HMO) | Attending: Emergency Medicine | Admitting: Emergency Medicine

## 2018-04-20 ENCOUNTER — Other Ambulatory Visit: Payer: Self-pay

## 2018-04-20 ENCOUNTER — Encounter (HOSPITAL_BASED_OUTPATIENT_CLINIC_OR_DEPARTMENT_OTHER): Payer: Self-pay

## 2018-04-20 DIAGNOSIS — Z79899 Other long term (current) drug therapy: Secondary | ICD-10-CM | POA: Insufficient documentation

## 2018-04-20 DIAGNOSIS — I10 Essential (primary) hypertension: Secondary | ICD-10-CM | POA: Insufficient documentation

## 2018-04-20 DIAGNOSIS — R519 Headache, unspecified: Secondary | ICD-10-CM

## 2018-04-20 DIAGNOSIS — R0989 Other specified symptoms and signs involving the circulatory and respiratory systems: Secondary | ICD-10-CM

## 2018-04-20 DIAGNOSIS — R51 Headache: Secondary | ICD-10-CM | POA: Diagnosis present

## 2018-04-20 MED ORDER — PROCHLORPERAZINE EDISYLATE 10 MG/2ML IJ SOLN
10.0000 mg | Freq: Once | INTRAMUSCULAR | Status: AC
  Administered 2018-04-20: 10 mg via INTRAVENOUS
  Filled 2018-04-20: qty 2

## 2018-04-20 MED ORDER — DEXAMETHASONE SODIUM PHOSPHATE 10 MG/ML IJ SOLN
10.0000 mg | Freq: Once | INTRAMUSCULAR | Status: AC
  Administered 2018-04-20: 10 mg via INTRAVENOUS
  Filled 2018-04-20: qty 1

## 2018-04-20 MED ORDER — SODIUM CHLORIDE 0.9 % IV BOLUS
1000.0000 mL | Freq: Once | INTRAVENOUS | Status: AC
  Administered 2018-04-20: 1000 mL via INTRAVENOUS

## 2018-04-20 MED ORDER — DIPHENHYDRAMINE HCL 50 MG/ML IJ SOLN
25.0000 mg | Freq: Once | INTRAMUSCULAR | Status: AC
  Administered 2018-04-20: 25 mg via INTRAVENOUS
  Filled 2018-04-20: qty 1

## 2018-04-20 MED FILL — ALENDRONATE NA 70 MG TAB: 70 | 28 days supply | Qty: 4 | Fill #0

## 2018-04-20 MED FILL — CYCLOBENZAPRINE HCL 5 MG TA: 5 | 15 days supply | Qty: 30 | Fill #0

## 2018-04-20 MED FILL — METOPROLOL TARTRATE 100 MG: 100 | 30 days supply | Qty: 60 | Fill #0

## 2018-04-20 MED FILL — AMLODIPINE BESYLATE 10 MG T: 10 | 30 days supply | Qty: 30 | Fill #0

## 2018-04-20 NOTE — ED Notes (Signed)
Pt states her head pain is much better and she is ready to go home; she is ambulatory to the restroom without difficulty.

## 2018-04-20 NOTE — ED Triage Notes (Signed)
C/o migraine HA since 8am-also c/o elevated BP with hx of HTN-NAD-steady gait

## 2018-04-20 NOTE — Discharge Instructions (Addendum)
Thank you for allowing me to care for you today in the Emergency Department.   You can call to get established with neurology regarding your headaches. Call the number on your discharge paperwork to get established with a primary care provider closer to your home.   Make sure to take your evening dose of your blood pressure medications.  If you feel a migraine coming on, take 1 tablet of almotriptan to abort a headache.   If you blood pressure remains high and you develop a severe headache suddenly, chest pain, blurred vision, if you become unable to pee, or develop severe abdominal pain, to the emergency department for re-evaluation.

## 2018-04-20 NOTE — ED Provider Notes (Signed)
[facility] EMERGENCY DEPARTMENT Provider Note   CSN: [id number] Arrival date & time: 04/20/18  1422     History   Chief Complaint Chief Complaint  Patient presents with  . Migraine    HPI Carla West is a 57 y.o. female with a history of hypertension, abnormal TSH, and migraines who presents to the emergency department with a chief complaint of headache.  The patient endorses a headache to her bilateral temples that began this morning with nausea, 2 episodes of nonbloody, nonbilious emesis, and dizziness.  She states the dizziness resolved earlier today.  She treated her symptoms prior to arrival with 1 tablet of Fioricet without improvement.  She denies numbness weakness, facial droop, slurred speech, tinnitus, otalgia, sinus pain or pressure, nasal congestion, neck pain or stiffness, fever, chills, blurred vision, diplopia, chest pain, palpitations, or dyspnea.  She states that the headache feels similar to previous migraines.  She reports that she was previously followed by neurology and at [facility], but has not been established with a neurologist since moving to [place].  She also reports that she has had difficulty controlling her blood pressure over the last few months despite taking her home medications.  She has been compliant with her home blood pressure medications.  She states she is taken to hydralazine 2 times a day instead of 3 times a day, but otherwise is taking her medications as prescribed.  Is not scheduled to follow-up with her primary care provider for another 3 months.  The history is provided by the patient. No language interpreter was used.    Past Medical History:  Diagnosis Date  . Abnormal Pap smear of cervix 08/03/2015  . Benign essential HTN 08/03/2015  . History of chicken pox   . Migraine headache 08/03/2015    Patient Active Problem List   Diagnosis Date Noted  . Palpitations 04/11/2018  . Pain in right toe(s)  04/11/2018  . Abnormal TSH 12/28/2017  . Hyperlipidemia 12/28/2017  . Preventative health care 12/07/2017  . Essential hypertension 08/03/2015  . Migraine headache 08/03/2015  . Abnormal Pap smear of cervix 08/03/2015  . History of chicken pox     Past Surgical History:  Procedure Laterality Date  . TUBAL LIGATION       OB History   None      Home Medications    Prior to Admission medications   Medication Sig Start Date End Date Taking? Authorizing Provider  alendronate (FOSAMAX) 70 MG tablet Take 1 tablet (70 mg total) by mouth every 7 (seven) days. Take with a full glass of water on an empty stomach. 04/11/18   Bradd Canary, MD  almotriptan (AXERT) 12.5 MG tablet Take 1 tablet (12.5 mg total) by mouth 2 (two) times daily as needed for migraine. may repeat in 2 hours if needed 04/11/18   Bradd Canary, MD  amitriptyline (ELAVIL) 10 MG tablet Take 1-2 tablets (10-20 mg total) by mouth at bedtime. 04/11/18   Bradd Canary, MD  amLODipine (NORVASC) 10 MG tablet Take 1 tablet (10 mg total) by mouth daily. 04/11/18   Bradd Canary, MD  butalbital-acetaminophen-caffeine (FIORICET, ESGIC) [phone] MG tablet TAKE 1 TABLET BY MOUTH EVERY 6 HOURS AS NEEDED FOR HEADACHE 04/11/18   Bradd Canary, MD  cyclobenzaprine (FLEXERIL) 5 MG tablet Take 0.5-2 tablets (2.5-10 mg total) by mouth at bedtime as needed for muscle spasms. 04/11/18   Bradd Canary, MD  hydrALAZINE (APRESOLINE) 25 MG tablet Take 1  tablet (25 mg total) by mouth 3 (three) times daily. 04/11/18   Bradd Canary, MD  metoprolol tartrate (LOPRESSOR) 100 MG tablet Take 1 tablet (100 mg total) by mouth 2 (two) times daily. 04/11/18   Bradd Canary, MD  olmesartan (BENICAR) 40 MG tablet Take 1 tablet (40 mg total) by mouth daily. 04/11/18   Bradd Canary, MD  traZODone (DESYREL) 50 MG tablet Take 0.5-1 tablets (25-50 mg total) by mouth at bedtime as needed for sleep. 12/06/17   Bradd Canary, MD    Family  History Family History  Problem Relation Age of Onset  . Hypertension Mother   . Heart disease Mother   . Hyperlipidemia Father   . Hyperlipidemia Sister   . Other Brother        plan crash in [employer]  . Migraines Son   . Heart disease Brother        CHF, arrythmia  . Hypertension Brother   . Gout Brother   . Hypertension Brother     Social History Social History   Tobacco Use  . Smoking status: Never Smoker  . Smokeless tobacco: Never Used  Substance Use Topics  . Alcohol use: No  . Drug use: No     Allergies   Elavil [amitriptyline hcl]   Review of Systems Review of Systems  Constitutional: Negative for activity change, chills and fever.  HENT: Negative for congestion, dental problem, sinus pressure, sinus pain, tinnitus and voice change.   Respiratory: Negative for shortness of breath and wheezing.   Cardiovascular: Negative for chest pain and palpitations.  Gastrointestinal: Positive for nausea and vomiting. Negative for abdominal pain, constipation and diarrhea.  Genitourinary: Negative for dysuria.  Musculoskeletal: Negative for back pain.  Skin: Negative for rash.  Allergic/Immunologic: Negative for immunocompromised state.  Neurological: Positive for dizziness (resolved) and headaches. Negative for seizures, syncope, weakness and light-headedness.  Psychiatric/Behavioral: Negative for confusion.     Physical Exam Updated Vital Signs BP (!) 179/102 (BP Location: Left Arm)   Pulse 75   Temp 98.5 F (36.9 C) (Oral)   Resp 18   Ht 5\' 7"  (1.702 m)   Wt 70.3 kg   SpO2 99%   BMI 24.28 kg/m   Physical Exam  Constitutional: She appears well-nourished. No distress.  HENT:  Head: Normocephalic.  Eyes: Pupils are equal, round, and reactive to light. Conjunctivae and EOM are normal.  Neck: Neck supple.  Cardiovascular: Normal rate, regular rhythm, normal heart sounds and intact distal pulses. Exam reveals no gallop and no friction rub.  No murmur  heard. Pulmonary/Chest: Effort normal and breath sounds normal. No stridor. No respiratory distress. She has no wheezes. She has no rales. She exhibits no tenderness.  Abdominal: Soft. She exhibits no distension.  Neurological: She is alert.  Alert and oriented x4.  Cranial nerves II through XII are grossly intact.  No cogwheeling.  No clonus.  Sensation is intact and symmetric throughout.  5 out of 5 strength against resistance of the bilateral upper and lower extremities.  No slurred speech.  Finger-to-nose is intact bilaterally.  Heel-to-shin is intact bilaterally.  Normal rapid alternating movements.  No pronator drift.  Skin: Skin is warm. No rash noted. She is not diaphoretic.  Psychiatric: Her behavior is normal.  Nursing note and vitals reviewed.    ED Treatments / Results  Labs (all labs ordered are listed, but only abnormal results are displayed) Labs Reviewed - No data to display  EKG None  Radiology No results found.  Procedures Procedures (including critical care time)  Medications Ordered in ED Medications - No data to display   Initial Impression / Assessment and Plan / ED Course  I have reviewed the triage vital signs and the nursing notes.  Pertinent labs & imaging results that were available during my care of the patient were reviewed by me and considered in my medical decision making (see chart for details).     57 year old female with a history of hypertension and migraines presenting with headache and elevated blood pressure since earlier today.  She has been compliant with her home antihypertensive medications.  She reports associated nausea, 2 episodes of nonbloody nonbilious emesis, and dizziness.  Dizziness is since resolved.  She has no other neurologic red flags on exam.  Neurologic exam is unremarkable.  She also states that her migraine feels like her usual migraines.  She does express concern about her labile blood pressure over the last few months.   She reports that the blood pressure today has not been higher than what she is seen, but she is concerned as to why she cannot get it under control despite taking her medications.  Given that patient's headache is like her similar migraines that she has no focal neurologic deficits, will defer labs and imaging at this time.  She was given a migraine cocktail and fluids and her headache is completely resolved prior to discharge.  She reports that she is feeling much better.  Blood pressure remains elevated; however, the patient did receive a liter of fluids.  She reports that she plans to follow-up to have her blood pressure rechecked in the next few days as she is tired of it being elevated.  She has been provided with a referral to neurology as well.  I have given the patient strict return precautions for signs and symptoms concerning for hypertensive urgency or emergency with elevated blood pressure.  The patient is agreeable and all questions were answered.  She is in no acute distress that she is safe for discharge home with outpatient follow-up at this time.  Final Clinical Impressions(s) / ED Diagnoses   Final diagnoses:  None    ED Discharge Orders    None       Barkley Boards, PA-C 04/20/18 2222    Maia Plan, MD 04/21/18 2033

## 2018-04-20 NOTE — ED Notes (Signed)
ED Provider at bedside. 

## 2018-04-20 NOTE — ED Notes (Signed)
Pt verbalizes understanding of d/c instructions and denies any further needs at this time. 

## 2018-04-26 ENCOUNTER — Ambulatory Visit
Admission: RE | Admit: 2018-04-26 | Discharge: 2018-04-26 | Disposition: A | Discharge status: Home / Self Care | Payer: Managed Care, Other (non HMO) | Source: Ambulatory Visit | Attending: Family Medicine | Admitting: Family Medicine

## 2018-04-26 DIAGNOSIS — M79621 Pain in right upper arm: Secondary | ICD-10-CM

## 2018-04-28 ENCOUNTER — Ambulatory Visit
Admission: RE | Admit: 2018-04-28 | Discharge: 2018-04-28 | Disposition: A | Discharge status: Home / Self Care | Payer: Managed Care, Other (non HMO) | Source: Ambulatory Visit | Attending: Family Medicine | Admitting: Family Medicine

## 2018-04-28 DIAGNOSIS — M8580 Other specified disorders of bone density and structure, unspecified site: Secondary | ICD-10-CM

## 2018-05-24 ENCOUNTER — Ambulatory Visit: Payer: Managed Care, Other (non HMO) | Admitting: Endocrinology

## 2018-05-24 DIAGNOSIS — Z0289 Encounter for other administrative examinations: Secondary | ICD-10-CM

## 2018-06-01 ENCOUNTER — Encounter: Payer: Self-pay | Admitting: Family Medicine

## 2018-06-05 ENCOUNTER — Telehealth: Payer: Self-pay

## 2018-06-05 NOTE — Telephone Encounter (Signed)
Copied from CRM [id number]. Topic: General - Other >> Jun 05, 2018  9:59 AM Gerrianne ScalePayne, Angela L wrote: Reason for CRM: pt calling stating that Dr Abner GreenspanBlyth was witting her a note for work and that she doesn't see it in my chart she need it for the six of Jan. Please write note today so that she can print it out

## 2018-06-12 ENCOUNTER — Other Ambulatory Visit: Payer: Self-pay

## 2018-06-12 ENCOUNTER — Encounter (HOSPITAL_BASED_OUTPATIENT_CLINIC_OR_DEPARTMENT_OTHER): Payer: Self-pay | Admitting: *Deleted

## 2018-06-12 ENCOUNTER — Emergency Department (HOSPITAL_BASED_OUTPATIENT_CLINIC_OR_DEPARTMENT_OTHER)
Admission: EM | Admit: 2018-06-12 | Discharge: 2018-06-12 | Disposition: A | Discharge status: Home / Self Care | Payer: Managed Care, Other (non HMO) | Attending: Emergency Medicine | Admitting: Emergency Medicine

## 2018-06-12 ENCOUNTER — Ambulatory Visit: Payer: Managed Care, Other (non HMO) | Admitting: Family Medicine

## 2018-06-12 ENCOUNTER — Ambulatory Visit: Payer: Self-pay

## 2018-06-12 DIAGNOSIS — R52 Pain, unspecified: Secondary | ICD-10-CM

## 2018-06-12 DIAGNOSIS — R51 Headache: Secondary | ICD-10-CM | POA: Diagnosis present

## 2018-06-12 DIAGNOSIS — G43109 Migraine with aura, not intractable, without status migrainosus: Secondary | ICD-10-CM | POA: Diagnosis not present

## 2018-06-12 DIAGNOSIS — R112 Nausea with vomiting, unspecified: Secondary | ICD-10-CM

## 2018-06-12 DIAGNOSIS — Z0289 Encounter for other administrative examinations: Secondary | ICD-10-CM

## 2018-06-12 DIAGNOSIS — M7918 Myalgia, other site: Secondary | ICD-10-CM | POA: Insufficient documentation

## 2018-06-12 DIAGNOSIS — I10 Essential (primary) hypertension: Secondary | ICD-10-CM | POA: Insufficient documentation

## 2018-06-12 LAB — COMPREHENSIVE METABOLIC PANEL
ALT: 33 U/L (ref 0–44)
AST: 26 U/L (ref 15–41)
Albumin: 3.9 g/dL (ref 3.5–5.0)
Alkaline Phosphatase: 93 U/L (ref 38–126)
Anion gap: 8 (ref 5–15)
BUN: 13 mg/dL (ref 6–20)
CO2: 22 mmol/L (ref 22–32)
Calcium: 9 mg/dL (ref 8.9–10.3)
Chloride: 107 mmol/L (ref 98–111)
Creatinine, Ser: 0.65 mg/dL (ref 0.44–1.00)
GFR calc Af Amer: >60 mL/min (ref >60)
GFR calc non Af Amer: >60 mL/min (ref >60)
Glucose, Bld: 112 mg/dL — ABNORMAL HIGH (ref 70–99)
Potassium: 3.9 mmol/L (ref 3.5–5.1)
Sodium: 137 mmol/L (ref 135–145)
Total Bilirubin: 0.3 mg/dL (ref 0.3–1.2)
Total Protein: 7.1 g/dL (ref 6.5–8.1)

## 2018-06-12 LAB — CBC WITH DIFFERENTIAL/PLATELET
Abs Immature Granulocytes: 0.01 10*3/uL (ref 0.00–0.07)
Basophils Absolute: 0 10*3/uL (ref 0.0–0.1)
Basophils Relative: 0 %
Eosinophils Absolute: 0 10*3/uL (ref 0.0–0.5)
Eosinophils Relative: 0 %
HCT: 40 % (ref 36.0–46.0)
Hemoglobin: 12.7 g/dL (ref 12.0–15.0)
Immature Granulocytes: 0 %
Lymphocytes Relative: 15 %
Lymphs Abs: 0.7 10*3/uL (ref 0.7–4.0)
MCH: 27.7 pg (ref 26.0–34.0)
MCHC: 31.8 g/dL (ref 30.0–36.0)
MCV: 87.1 fL (ref 80.0–100.0)
Monocytes Absolute: 0.3 10*3/uL (ref 0.1–1.0)
Monocytes Relative: 7 %
Neutro Abs: 3.3 10*3/uL (ref 1.7–7.7)
Neutrophils Relative %: 78 %
Platelets: 229 10*3/uL (ref 150–400)
RBC: 4.59 MIL/uL (ref 3.87–5.11)
RDW: 13.1 % (ref 11.5–15.5)
WBC: 4.2 10*3/uL (ref 4.0–10.5)
nRBC: 0 % (ref 0.0–0.2)

## 2018-06-12 LAB — LIPASE, BLOOD: Lipase: 21 U/L (ref 11–51)

## 2018-06-12 LAB — TROPONIN I: Troponin I: <0.03 ng/mL (ref <0.03)

## 2018-06-12 MED ORDER — ONDANSETRON 4 MG PO TBDP: 4.0000 mg | ORAL_TABLET | Freq: Three times a day (TID) | ORAL | 0 refills | Status: DC | PRN

## 2018-06-12 MED ORDER — OSELTAMIVIR PHOSPHATE 75 MG PO CAPS: 75.0000 mg | ORAL_CAPSULE | Freq: Two times a day (BID) | ORAL | 0 refills | Status: AC

## 2018-06-12 MED ORDER — IBUPROFEN 800 MG PO TABS: 800.0000 mg | ORAL_TABLET | Freq: Three times a day (TID) | ORAL | 0 refills | Status: DC | PRN

## 2018-06-12 MED ORDER — SODIUM CHLORIDE 0.9 % IV BOLUS
1000.0000 mL | Freq: Once | INTRAVENOUS | Status: AC
  Administered 2018-06-12: 1000 mL via INTRAVENOUS

## 2018-06-12 MED ORDER — KETOROLAC TROMETHAMINE 30 MG/ML IJ SOLN
30.0000 mg | Freq: Once | INTRAMUSCULAR | Status: AC
  Administered 2018-06-12: 30 mg via INTRAVENOUS
  Filled 2018-06-12: qty 1

## 2018-06-12 MED ORDER — METOCLOPRAMIDE HCL 5 MG/ML IJ SOLN
10.0000 mg | Freq: Once | INTRAMUSCULAR | Status: AC
  Administered 2018-06-12: 10 mg via INTRAVENOUS
  Filled 2018-06-12: qty 2

## 2018-06-12 MED ORDER — DIPHENHYDRAMINE HCL 50 MG/ML IJ SOLN
50.0000 mg | Freq: Once | INTRAMUSCULAR | Status: AC
  Administered 2018-06-12: 50 mg via INTRAVENOUS
  Filled 2018-06-12: qty 1

## 2018-06-12 MED FILL — ONDANSETRON ODT 4 MG TABLET: 4 | 6 days supply | Qty: 20 | Fill #0

## 2018-06-12 MED FILL — IBUPROFEN 800 MG TAB: 800 | 7 days supply | Qty: 21 | Fill #0

## 2018-06-12 MED FILL — OSELTAMIVIR PHOSPHATE 75 MG: 75 | 5 days supply | Qty: 10 | Fill #0

## 2018-06-12 NOTE — Telephone Encounter (Signed)
Patient came in to office for Appointment for Migraine, she went in to the lobby bathroom and pull the help cord. I found her on her knees in the bathroom floor with Br. Abner GreenspanBlyth there with me. We got her into a chair but pt could not sit up without help, so Dr. Abner GreenspanBlyth had registrar call for stretcher from ED. Pt was having vomiting & diarrhea, dizziness & weakness. Got set of vitals: Pulse Ox:98 HR: 96 BP: 208/103; forwarded to provider/SLS 12/30

## 2018-06-12 NOTE — ED Provider Notes (Signed)
Emergency Department Provider Note   I have reviewed the triage vital signs and the nursing notes.   HISTORY  Chief Complaint Migraine and Near Syncope   HPI Unknown Carla West is a 57 y.o. female presents to the emergency department for evaluation of migraine headache with vomiting and near syncope.  Patient states that she awoke this morning with her typical migraine headache.  She had some diarrhea which is also typical and went to her primary care physician.  She has primarily right-sided throbbing headache with photophobia and nausea.  While waiting for her appointment she had to go to the bathroom for vomiting at which point she felt like she almost passed out.  She denies any chest pain, heart palpitations, shortness of breath.  She was evaluated and referred to the emergency department.  She states that this feels like her typical migraine with the exception of some shaking chills and body aches.  She is concerned she may be developing flulike symptoms and is asking for Tamiflu.  No radiation of symptoms or other modifying factors.   Past Medical History:  Diagnosis Date  . Abnormal Pap smear of cervix 08/03/2015  . Benign essential HTN 08/03/2015  . History of chicken pox   . Migraine headache 08/03/2015    Patient Active Problem List   Diagnosis Date Noted  . Palpitations 04/11/2018  . Pain in right toe(s) 04/11/2018  . Abnormal TSH 12/28/2017  . Hyperlipidemia 12/28/2017  . Preventative health care 12/07/2017  . Essential hypertension 08/03/2015  . Migraine headache 08/03/2015  . Abnormal Pap smear of cervix 08/03/2015  . History of chicken pox     Past Surgical History:  Procedure Laterality Date  . TUBAL LIGATION      Allergies Elavil [amitriptyline hcl]  Family History  Problem Relation Age of Onset  . Hypertension Mother   . Heart disease Mother   . Hyperlipidemia Father   . Breast cancer Maternal Grandmother   . Hyperlipidemia Sister   . Other Brother         plan crash in [employer]  . Migraines Son   . Heart disease Brother        CHF, arrythmia  . Hypertension Brother   . Gout Brother   . Hypertension Brother     Social History Social History   Tobacco Use  . Smoking status: Never Smoker  . Smokeless tobacco: Never Used  Substance Use Topics  . Alcohol use: No  . Drug use: No    Review of Systems  Constitutional: No fever. Positive chills and body aches.  Eyes: No visual changes. Positive photophobia.  ENT: No sore throat. Cardiovascular: Denies chest pain. Respiratory: Denies shortness of breath. Gastrointestinal: No abdominal pain. Positive nausea and vomiting. Positive diarrhea.  No constipation. Genitourinary: Negative for dysuria. Musculoskeletal: Negative for back pain. Skin: Negative for rash. Neurological: Negative for focal weakness or numbness. Positive HA.   10-point ROS otherwise negative.  ____________________________________________   PHYSICAL EXAM:  VITAL SIGNS: ED Triage Vitals [06/12/18 1309]  Enc Vitals Group     BP (!) 163/98     Pulse Rate 80     Resp 18     Temp 98.3 F (36.8 C)     Temp Source Oral     SpO2 98 %     Weight 150 lb (68 kg)     Height 5\' 7"  (1.702 m)     Pain Score 9   Constitutional: Alert and oriented. Patient sitting in  dark room and covered in blankets.  Eyes: Conjunctivae are normal. PERRL. EOMI. Head: Atraumatic. Nose: No congestion/rhinnorhea. Mouth/Throat: Mucous membranes are moist.  Oropharynx non-erythematous. Neck: No stridor. Cardiovascular: Normal rate, regular rhythm. Good peripheral circulation. Grossly normal heart sounds.   Respiratory: Normal respiratory effort.  No retractions. Lungs CTAB. Gastrointestinal: Soft and nontender. No distention.  Musculoskeletal: No lower extremity tenderness nor edema. No gross deformities of extremities. Neurologic:  Normal speech and language. No gross focal neurologic deficits are appreciated.  Skin:  Skin is  warm, dry and intact. No rash noted.  ____________________________________________   LABS (all labs ordered are listed, but only abnormal results are displayed)  Labs Reviewed  COMPREHENSIVE METABOLIC PANEL - Abnormal; Notable for the following components:      Result Value   Glucose, Bld 112 (*)    All other components within normal limits  CBC WITH DIFFERENTIAL/PLATELET  TROPONIN I  LIPASE, BLOOD   ____________________________________________  RADIOLOGY  None ____________________________________________   PROCEDURES  Procedure(s) performed:   Procedures  None ____________________________________________   INITIAL IMPRESSION / ASSESSMENT AND PLAN / ED COURSE  Pertinent labs & imaging results that were available during my care of the patient were reviewed by me and considered in my medical decision making (see chart for details).  Patient presents to the emergency department headache symptoms typical of her migraines.  She had a near syncope event while vomiting.  Very low suspicion for cardiogenic syncope.  Patient is having body aches and chills.  She is concerned for possible flulike symptoms.  She does endorse later to me that she is having some nasal congestion.  I have agreed to offer Tamiflu and have her follow with the primary care physician.  She does have resolution of headache symptoms after migraine cocktail here.  Lab work with no acute findings.   ____________________________________________  FINAL CLINICAL IMPRESSION(S) / ED DIAGNOSES  Final diagnoses:  Migraine with aura and without status migrainosus, not intractable  Non-intractable vomiting with nausea, unspecified vomiting type  Body aches     MEDICATIONS GIVEN DURING THIS VISIT:  Medications  sodium chloride 0.9 % bolus 1,000 mL (0 mLs Intravenous Stopped 06/12/18 1443)  ketorolac (TORADOL) 30 MG/ML injection 30 mg (30 mg Intravenous Given 06/12/18 1343)  metoCLOPramide (REGLAN) injection  10 mg (10 mg Intravenous Given 06/12/18 1343)  diphenhydrAMINE (BENADRYL) injection 50 mg (50 mg Intravenous Given 06/12/18 1343)     NEW OUTPATIENT MEDICATIONS STARTED DURING THIS VISIT:  Discharge Medication List as of 06/12/2018  2:40 PM    START taking these medications   Details  ibuprofen (ADVIL,MOTRIN) 800 MG tablet Take 1 tablet (800 mg total) by mouth every 8 (eight) hours as needed for headache or moderate pain., Starting Mon 06/12/2018, Print    ondansetron (ZOFRAN ODT) 4 MG disintegrating tablet Take 1 tablet (4 mg total) by mouth every 8 (eight) hours as needed for nausea., Starting Mon 06/12/2018, Print    oseltamivir (TAMIFLU) 75 MG capsule Take 1 capsule (75 mg total) by mouth every 12 (twelve) hours for 5 days., Starting Mon 06/12/2018, Until Sat 06/17/2018, Print        Note:  This document was prepared using Dragon voice recognition software and may include unintentional dictation errors.  Alona BeneJoshua Long, MD Emergency Medicine    Long, Arlyss RepressJoshua G, MD 06/12/18 2126

## 2018-06-12 NOTE — Telephone Encounter (Signed)
Have her come in for evaluation and possible shots to see if that can help

## 2018-06-12 NOTE — ED Triage Notes (Signed)
Pt went to PCP today for a migraine that started at 3:30am. She had a syncopal episode while there and was sent here to ER.

## 2018-06-12 NOTE — Telephone Encounter (Signed)
Routed to Dr. Abner GreenspanBlyth and Carollee HerterPrincess, CMA to advise.

## 2018-06-12 NOTE — Discharge Instructions (Signed)

## 2018-06-12 NOTE — Telephone Encounter (Signed)
Patient scheduled with PCP @ 1pm

## 2018-06-12 NOTE — Telephone Encounter (Signed)
Patient called in with c/o "migraine, nausea, vomiting, diarrhea." She says "I woke up at 0330 this morning with a migraine. I am nauseated, I vomited twice and I have diarrhea. I checked my BP at 0300 and 0300 and it was 170/101. The headache is a 10, hurting over my right eye." I advised that she will need to go to the ED or UC. She says "why would I go there just to sit for hours when I can come in the office. Dr. Abner GreenspanBlyth told me they have emergency openings. I just need to come in and get the shots. I don't want to go sit for hours in the emergency room or urgent care." I advised no openings with any provider at the office. She asked me to check other [facility] locations for today. I advised after checking all of the [place] locations in [place] that there are no openings today, but several openings tomorrow. She says "will you just ask if Dr. Abner GreenspanBlyth can call the medicine in and I will give the shots to myself. I've done it before." I advised that I spoke to DewarEmily, [facility] and she advised if it's an emergency to go the the ED, but she will ask Dr. Abner GreenspanBlyth about an appointment and call the patient back with her recommendation. The patient says "ok, I'll wait on the call, but this is ridiculous."   Reason for Disposition . [1] SEVERE headache (e.g., excruciating) AND [2] not improved after 2 hours of pain medicine  Answer Assessment - Initial Assessment Questions 1. LOCATION: "Where does it hurt?"      Over right eye 2. ONSET: "When did the headache start?" (Minutes, hours or days)      This morning around 3:30-4 am 3. PATTERN: "Does the pain come and go, or has it been constant since it started?"     Constant 4. SEVERITY: "How bad is the pain?" and "What does it keep you from doing?"  (e.g., Scale 1-10; mild, moderate, or severe)   - MILD (1-3): doesn't interfere with normal activities    - MODERATE (4-7): interferes with normal activities or awakens from sleep    - SEVERE (8-10): excruciating pain, unable  to do any normal activities        10 5. RECURRENT SYMPTOM: "Have you ever had headaches before?" If so, ask: "When was the last time?" and "What happened that time?"      Yes 6. CAUSE: "What do you think is causing the headache?"     Migraine, high BP 7. MIGRAINE: "Have you been diagnosed with migraine headaches?" If so, ask: "Is this headache similar?"      Yes 8. HEAD INJURY: "Has there been any recent injury to the head?"      No 9. OTHER SYMPTOMS: "Do you have any other symptoms?" (fever, stiff neck, eye pain, sore throat, cold symptoms)     Nausea, vomiting, diarrhea, cold symptoms 10. PREGNANCY: "Is there any chance you are pregnant?" "When was your last menstrual period?"       No  Protocols used: HEADACHE-A-AH

## 2018-06-15 ENCOUNTER — Encounter: Payer: Self-pay | Admitting: Family Medicine

## 2018-06-15 MED FILL — AMLODIPINE BESYLATE 10 MG T: 10 | 30 days supply | Qty: 30 | Fill #1

## 2018-06-15 MED FILL — METOPROLOL TARTRATE 100 MG: 100 | 30 days supply | Qty: 60 | Fill #1

## 2018-06-15 NOTE — Telephone Encounter (Signed)
Patient presented to appointment for severe migraine with nausea, vomiting and high blood pressure which usually accompany hr Migraines but she also noted diarrhea and chills. While in the bathroom in our waiting room she had a near syncopal episodes and significant weakness. She was transferred to the emergency room for triage and further treatment

## 2018-06-16 ENCOUNTER — Other Ambulatory Visit: Payer: Self-pay | Admitting: Family Medicine

## 2018-06-16 DIAGNOSIS — G43809 Other migraine, not intractable, without status migrainosus: Secondary | ICD-10-CM

## 2018-06-16 MED ORDER — PROMETHAZINE HCL 25 MG PO TABS: 25.0000 mg | ORAL_TABLET | Freq: Three times a day (TID) | ORAL | 1 refills | Status: DC | PRN

## 2018-06-27 ENCOUNTER — Encounter: Payer: Self-pay | Admitting: Family Medicine

## 2018-07-20 ENCOUNTER — Ambulatory Visit: Payer: Managed Care, Other (non HMO) | Admitting: Family Medicine

## 2018-08-01 ENCOUNTER — Ambulatory Visit: Payer: Managed Care, Other (non HMO) | Admitting: Family Medicine

## 2018-08-02 ENCOUNTER — Encounter: Payer: Self-pay | Admitting: Neurology

## 2018-08-11 ENCOUNTER — Telehealth: Payer: Self-pay | Admitting: Family Medicine

## 2018-08-11 NOTE — Telephone Encounter (Signed)
Patient would like to discuss cancellations and reschedules with office manager. Patient was upset that we had to reschedule appointment now on 08/29/18 previously on 08/22/18.

## 2018-08-18 MED FILL — AMLODIPINE BESYLATE 10 MG T: 10 | 30 days supply | Qty: 30 | Fill #2

## 2018-08-18 MED FILL — AMITRIPTYLINE HCL 10 MG TAB: 10 | 30 days supply | Qty: 60 | Fill #0

## 2018-08-18 MED FILL — BUTALBITAL-APAP-CAFFEINE 50: 50-325-40 | 8 days supply | Qty: 30 | Fill #0

## 2018-08-18 MED FILL — ALMOTRIPTAN MALATE 12.5 MG: 12.5 | 30 days supply | Qty: 10 | Fill #0

## 2018-08-18 MED FILL — METOPROLOL TARTRATE 100 MG: 100 | 30 days supply | Qty: 60 | Fill #2

## 2018-08-22 ENCOUNTER — Ambulatory Visit: Payer: Managed Care, Other (non HMO) | Admitting: Family Medicine

## 2018-08-23 MED FILL — traZODone HCL 50 MG TABS: 50 | 30 days supply | Qty: 30 | Fill #0

## 2018-08-28 ENCOUNTER — Ambulatory Visit: Payer: Managed Care, Other (non HMO) | Admitting: Neurology

## 2018-08-29 ENCOUNTER — Ambulatory Visit: Payer: Managed Care, Other (non HMO) | Admitting: Family Medicine

## 2018-09-05 ENCOUNTER — Ambulatory Visit: Payer: Managed Care, Other (non HMO) | Admitting: Family Medicine

## 2018-09-18 ENCOUNTER — Other Ambulatory Visit: Payer: Self-pay | Admitting: Family Medicine

## 2018-09-18 DIAGNOSIS — G43809 Other migraine, not intractable, without status migrainosus: Secondary | ICD-10-CM

## 2018-09-18 MED FILL — AMITRIPTYLINE HCL 10 MG TAB: 10 | 30 days supply | Qty: 60 | Fill #1

## 2018-09-18 MED FILL — AMLODIPINE BESYLATE 10 MG T: 10 | 30 days supply | Qty: 30 | Fill #3

## 2018-09-18 MED FILL — METOPROLOL TARTRATE 100 MG: 100 | 30 days supply | Qty: 60 | Fill #3

## 2018-09-20 MED FILL — BUTALBITAL-APAP-CAFFEINE 50: 50-325-40 | 7 days supply | Qty: 30 | Fill #0

## 2018-09-25 MED FILL — traZODone HCL 50 MG TABS: 50 | 30 days supply | Qty: 30 | Fill #1

## 2018-10-23 ENCOUNTER — Other Ambulatory Visit: Payer: Self-pay | Admitting: Family Medicine

## 2018-10-23 MED FILL — AMLODIPINE BESYLATE 10 MG T: 10 | 30 days supply | Qty: 30 | Fill #4

## 2018-10-23 MED FILL — AMITRIPTYLINE HCL 10 MG TAB: 10 | 30 days supply | Qty: 60 | Fill #2

## 2018-10-23 MED FILL — METOPROLOL TARTRATE 100 MG: 100 | 30 days supply | Qty: 60 | Fill #0

## 2018-10-27 ENCOUNTER — Other Ambulatory Visit: Payer: Self-pay

## 2018-10-27 ENCOUNTER — Other Ambulatory Visit: Payer: Self-pay | Admitting: Family Medicine

## 2018-10-27 ENCOUNTER — Ambulatory Visit (HOSPITAL_BASED_OUTPATIENT_CLINIC_OR_DEPARTMENT_OTHER)
Admission: RE | Admit: 2018-10-27 | Discharge: 2018-10-27 | Disposition: A | Discharge status: Home / Self Care | Payer: Managed Care, Other (non HMO) | Source: Ambulatory Visit | Attending: Medical | Admitting: Medical

## 2018-10-27 ENCOUNTER — Telehealth: Payer: Self-pay | Admitting: Medical

## 2018-10-27 ENCOUNTER — Ambulatory Visit (INDEPENDENT_AMBULATORY_CARE_PROVIDER_SITE_OTHER): Payer: Managed Care, Other (non HMO) | Admitting: Medical

## 2018-10-27 VITALS — BP 123/80 | HR 88

## 2018-10-27 DIAGNOSIS — G43809 Other migraine, not intractable, without status migrainosus: Secondary | ICD-10-CM

## 2018-10-27 DIAGNOSIS — M25561 Pain in right knee: Secondary | ICD-10-CM

## 2018-10-27 DIAGNOSIS — M79609 Pain in unspecified limb: Secondary | ICD-10-CM | POA: Insufficient documentation

## 2018-10-27 DIAGNOSIS — M79661 Pain in right lower leg: Secondary | ICD-10-CM | POA: Insufficient documentation

## 2018-10-27 MED ORDER — HYDROCODONE-ACETAMINOPHEN 5-325 MG PO TABS: 1.0000 | ORAL_TABLET | Freq: Four times a day (QID) | ORAL | 0 refills | Status: DC | PRN

## 2018-10-27 MED FILL — traZODone HCL 50 MG TABS: 50 | 30 days supply | Qty: 30 | Fill #2

## 2018-10-27 NOTE — Telephone Encounter (Signed)
Resent norco to [facility] and sent message to jasmine to call [facility] and cancel [facility] sent to [facility]. That was wrong pharmacy per pt.

## 2018-10-27 NOTE — Patient Instructions (Addendum)
For your rt knee, rt popliteal area pain and calf pain, I want to get rt lower ext [place] and rt knee xray today.   Did discuss with pt differential diagnosis today and mentioned ED evaluation as we approach weekend if her o2 sat was poor or her reported shortnes of breath worsens.   Decided on doing above studies. Unable to do any lab work today in our office as our lab appointment were full(some restriction now due to covid pandemic.)  Will review pt imaging studies and make recommendations. [place] came back and showed no dvt. Xray also negative.  Rx tramadol for pain and avoid nsaids since bp tends to run high in the past.  Will also offer sports med referral.  Note also mentioned to pt that even though her [place] was normal if she feels any worsening shortness of breath then be seen in ED at [facility].  Follow up in one week or as needed

## 2018-10-27 NOTE — Telephone Encounter (Signed)
Will you cancel the prescription of norco that I sent to [facility] pharmacy.

## 2018-10-27 NOTE — Telephone Encounter (Signed)
Requesting: F[facility]et Contract: N/A UDS: N/A Last OV: 10/27/2018 w/ Saguier Next OV: N/A Last Refill: 09/20/2018, #30-0 RF Database:   Please advise

## 2018-10-27 NOTE — Progress Notes (Signed)
Subjective:    Patient ID: Carla West, female    DOB: 1960/08/23, 58 y.o.   MRN: [id number]  HPI  Virtual Visit via Video Note  I connected with Carla West on 10/27/18 at  1:40 PM EDT by a video enabled telemedicine application and verified that I am speaking with the correct person using two identifiers.  Location: Patient: work Provider: home   I discussed the limitations of evaluation and management by telemedicine and the availability of in person appointments. The patient expressed understanding and agreed to proceed.  History of Present Illness: Pt in with some knee pain, calf pain and pain behind knee for past 2 weeks. She states pain behind knee started on Monday. Pain behind knee when she drives and presses on gas. When she rubs back of popliteal area will feel sore. Pt states when she walks up stairs she will get out of breath more easily than usual  No report of any long trips, leg trauma, smoking or history of dvt.  No chest pain reported Only feels sob when goes up flights of steps.   Observations/Objective: General- no acute distress.  Rt knee-rt calf and popliteal pain on standing. When pt massages rt popliteal area reports pain. Pt states calfs are symmetric.  Neck on inspeection- no jvd. Trachea appears midline.   Assessment and Plan:  For your rt knee, rt popliteal area pain and calf pain, I want to get rt lower ext [place] and rt knee xray today.   Did discuss with pt differential diagnosis today and mentioned ED evaluation as we approach weekend if her o2 sat was poor or her reported shortnes of breath worsens.   Decided on doing above studies. Unable to do any lab work today in our office as our lab appointment were full(some restriction now due to covid pandemic.)  Will review pt imaging studies and make recommendations. [place] came back and showed no dvt. Xray also negative.  Rx tramadol for pain and avoid nsaids since bp tends to run high in the  past.  Will also offer sports med referral.  Note also mentioned to pt that even though her [place] was normal if she feels any worsening shortness of breath then be seen in ED at [facility].  Follow up in one week or as needed   [name], PA-C  Follow Up Instructions:    I discussed the assessment and treatment plan with the patient. The patient was provided an opportunity to ask questions and all were answered. The patient agreed with the plan and demonstrated an understanding of the instructions.   The patient was advised to call back or seek an in-person evaluation if the symptoms worsen or if the condition fails to improve as anticipated.     [name], PA-C    Review of Systems  Constitutional: Negative for chills, fatigue and fever.  Respiratory: Negative for chest tightness and shortness of breath.        Some mild sob on going up steps. I asked pt to check her o2 sat and pulse after climbing steps.  Cardiovascular: Negative for chest pain and palpitations.  Gastrointestinal: Negative for abdominal pain.  Musculoskeletal: Negative for back pain and myalgias.       See hpi on legs.  Skin: Negative for rash.  Neurological: Negative for facial asymmetry.  Psychiatric/Behavioral: Negative for behavioral problems and confusion.       Objective:   Physical Exam        Assessment &  Plan:

## 2018-10-31 ENCOUNTER — Ambulatory Visit: Payer: Managed Care, Other (non HMO) | Admitting: Family Medicine

## 2018-11-02 ENCOUNTER — Ambulatory Visit: Payer: Managed Care, Other (non HMO) | Admitting: Family Medicine

## 2018-11-09 ENCOUNTER — Ambulatory Visit: Payer: Self-pay | Admitting: *Deleted

## 2018-11-09 NOTE — Telephone Encounter (Signed)
Pt reporting diarrhea, requesting appt with E. Saguier tomorrow am. When attempted to triage pt and explained appt process pt stated she did not want a virtual appt. Before TN could explain further, and perhaps he would see her in office,pt hung up, saying she will CB tomorrow.

## 2018-11-10 ENCOUNTER — Ambulatory Visit (INDEPENDENT_AMBULATORY_CARE_PROVIDER_SITE_OTHER): Payer: Managed Care, Other (non HMO) | Admitting: Medical

## 2018-11-10 ENCOUNTER — Other Ambulatory Visit: Payer: Self-pay

## 2018-11-10 ENCOUNTER — Encounter: Payer: Self-pay | Admitting: Medical

## 2018-11-10 VITALS — BP 159/88 | HR 74 | Temp 98.8°F

## 2018-11-10 DIAGNOSIS — R197 Diarrhea, unspecified: Secondary | ICD-10-CM

## 2018-11-10 DIAGNOSIS — R05 Cough: Secondary | ICD-10-CM

## 2018-11-10 DIAGNOSIS — R059 Cough, unspecified: Secondary | ICD-10-CM

## 2018-11-10 NOTE — Progress Notes (Signed)
Subjective:    Patient ID: Carla West, female    DOB: 8Unknown Foley/04/1961, 58 y.o.   MRN: [id number]  HPI  Virtual Visit via Video Note  I connected with Carla West on 11/10/18 at 11:00 AM EDT by a video enabled telemedicine application and verified that I am speaking with the correct person using two identifiers.  Location: Patient: home Provider:home   I discussed the limitations of evaluation and management by telemedicine and the availability of in person appointments. The patient expressed understanding and agreed to proceed.  History of Present Illness:  Pt had diarrhea for about 2 weeks. 4 times a day of watery stools. No recent recent antibiotics. Some occasional cough x 1 day. 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 Works at [employer]. No fever,but some chills. Has cough for one day.   Pt has no sob or wheezing. Faint mild st.  Pt works in administration.  Pt was tested on 10/30/2018 for covid. Her test was negative. Some of staff are still pending.(these were screening test only). No symptoms for pt at that time.  In her facility no known c dificile.  Pt is using immodium and it helps some. Only taking one tablets. Pt eating bland foods.       Observations/Objective: Pt had diarrhea now for about 4 weeks. So considered some dehydration but bp high an pulse normal. So advised bland diet guidelines, hydrate well with gatorade and increase her immodium to 2 tab every 6-8 hours. If diarrhea persists by early next week then turn in gastropanel.  Pt also has cough x 1 day with some chills. Occasional and not productive cough. 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 So gave number to call to get scheduled for covid testing. Explained give them signs/symptoms info and explain asking for test to get test done today.   If pt test negative will follow her over next week to see if signs/symptoms worsen. If so might need  retesting or expansion of work up.  Work note/excuse for pt. Asked MA to fax that over.  Follow up this coming Wednesday. Asked for my chart update. Based on that might want virtual visit again.  Assessment and Plan: [phone](Fax)  Pt had diarrhea now for about 4 weeks. So considered some dehydration but bp high an pulse normal. So advised bland diet guidelines, hydrate well with gatorade and increase her immodium to 2 tab every 6-8 hours. If diarrhea persists by early next week then turn in gastropanel.  Pt also has cough x 1 day with some chills. Occasional and not productive cough. 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 So gave number to call to get scheduled for covid testing. Explained give them signs/symptoms info and explain asking for test to get test done today.   If pt test negative will follow her over next week to see if signs/symptoms worsen. If so might need retesting or expansion of work up.  Work note/excuse for pt. Asked MA to fax that over.  Follow up this coming Wednesday. Asked for my chart update. Based on that might want virtual visit again.  Follow Up Instructions:    I discussed the assessment and treatment plan with the patient. The patient was provided an opportunity to ask questions and all were answered. The patient agreed with the plan and demonstrated an understanding of the instructions.   The patient was advised to call  back or seek an in-person evaluation if the symptoms worsen or if the condition fails to improve as anticipated.     [name], PA-C   Review of Systems  Constitutional: Negative for chills, fatigue and fever.  Respiratory: Positive for cough. Negative for chest tightness, shortness of breath and wheezing.        Occasional for one day.  Cardiovascular: Negative for chest pain and palpitations.  Gastrointestinal: Positive for abdominal pain and diarrhea. Negative for blood in stool, nausea and vomiting.   Musculoskeletal: Negative for back pain.  Skin: Negative for rash.  Neurological: Negative for dizziness, weakness, numbness and headaches.  Hematological: Negative for adenopathy. Does not bruise/bleed easily.  Psychiatric/Behavioral: Negative for behavioral problems, decreased concentration and suicidal ideas. The patient is not nervous/anxious.     Past Medical History:  Diagnosis Date  . Abnormal Pap smear of cervix 08/03/2015  . Benign essential HTN 08/03/2015  . History of chicken pox   . Migraine headache 08/03/2015     Social History   Socioeconomic History  . Marital status: Single    Spouse name: Not on file  . Number of children: Not on file  . Years of education: Not on file  . Highest education level: Not on file  Occupational History  . Not on file  Social Needs  . Financial resource strain: Not on file  . Food insecurity:    Worry: Not on file    Inability: Not on file  . Transportation needs:    Medical: Not on file    Non-medical: Not on file  Tobacco Use  . Smoking status: Never Smoker  . Smokeless tobacco: Never Used  Substance and Sexual Activity  . Alcohol use: No  . Drug use: No  . 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 Physical activity:    Days per week: Not on file    Minutes per session: Not on file  . Stress: Not on file  Relationships  . Social connections:    Talks on phone: Not on file    Gets together: Not on file    Attends religious service: Not on file    Active member of club or organization: Not on file    Attends meetings of clubs or organizations: Not on file    Relationship status: Not on file  . Intimate partner violence:    Fear of current or ex partner: Not on file    Emotionally abused: Not on file    Physically abused: Not on file    Forced sexual activity: Not on file  Other Topics Concern  . Not on file  Social History  Narrative  . Not on file    Past Surgical History:  Procedure Laterality Date  . TUBAL LIGATION      Family History  Problem Relation Age of Onset  . Hypertension Mother   . Heart disease Mother   . Hyperlipidemia Father   . Breast cancer Maternal Grandmother   . Hyperlipidemia Sister   . Other Brother        plan crash in [employer]  . Migraines Son   . Heart disease Brother        CHF, arrythmia  . Hypertension Brother   . Gout Brother   . Hypertension Brother     Allergies  Allergen Reactions  . Elavil [Amitriptyline Hcl]     Current Outpatient Medications  on File Prior to Visit  Medication Sig Dispense Refill  . alendronate (FOSAMAX) 70 MG tablet Take 1 tablet (70 mg total) by mouth every 7 (seven) days. Take with a full glass of water on an empty stomach. 4 tablet 11  . almotriptan (AXERT) 12.5 MG tablet TAKE 1 TABLET BY MOUTH TWICE DAILY AS NEEDED FOR MIGRAINE. MAY REPEAT IN 2 HOURS IF NEEDED 10 tablet 0  . amitriptyline (ELAVIL) 10 MG tablet Take 1-2 tablets (10-20 mg total) by mouth at bedtime. 60 tablet 3  . amLODipine (NORVASC) 10 MG tablet Take 1 tablet (10 mg total) by mouth daily. 30 tablet 5  . butalbital-acetaminophen-caffeine (FIORICET) 50-325-40 MG tablet TAKE 1 TABLET BY MOUTH EVERY 6 HOURS AS NEEDED FOR HEADACHE 30 tablet 0  . cyclobenzaprine (FLEXERIL) 5 MG tablet Take 0.5-2 tablets (2.5-10 mg total) by mouth at bedtime as needed for muscle spasms. 30 tablet 1  . hydrALAZINE (APRESOLINE) 25 MG tablet Take 1 tablet (25 mg total) by mouth 3 (three) times daily. 90 tablet 2  . HYDROcodone-acetaminophen (NORCO) 5-325 MG tablet Take 1 tablet by mouth every 6 (six) hours as needed for moderate pain. 16 tablet 0  . ibuprofen (ADVIL,MOTRIN) 800 MG tablet Take 1 tablet (800 mg total) by mouth every 8 (eight) hours as needed for headache or moderate pain. 21 tablet 0  . metoprolol tartrate (LOPRESSOR) 100 MG tablet TAKE 1 TABLET BY MOUTH TWICE DAILY 60 tablet 3  .  olmesartan (BENICAR) 40 MG tablet Take 1 tablet (40 mg total) by mouth daily. 90 tablet 1  . ondansetron (ZOFRAN ODT) 4 MG disintegrating tablet Take 1 tablet (4 mg total) by mouth every 8 (eight) hours as needed for nausea. 20 tablet 0  . promethazine (PHENERGAN) 25 MG tablet Take 1 tablet (25 mg total) by mouth every 8 (eight) hours as needed for nausea or vomiting. 20 tablet 1  . traZODone (DESYREL) 50 MG tablet Take 0.5-1 tablets (25-50 mg total) by mouth at bedtime as needed for sleep. 30 tablet 5   No current facility-administered medications on file prior to visit.     BP (!) 159/88   Pulse 74   Temp 98.8 F (37.1 C)       Objective:   Physical Exam        Assessment & Plan:

## 2018-11-10 NOTE — Patient Instructions (Addendum)
Pt had diarrhea now for about 4 weeks. So considered some dehydration but bp high an pulse normal. So advised bland diet guidelines, hydrate well with gatorade and increase her immodium to 2 tab every 6-8 hours. If diarrhea persists by early next week then turn in gastropanel.  Pt also has cough x 1 day with some chills. Occasional and not productive cough. 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 So gave number to call to get scheduled for covid testing. Explained give them signs/symptoms info and explain asking for test to get test done today.   If pt test negative will follow her over next week to see if signs/symptoms worsen. If so might need retesting or expansion of work up.  Work note/excuse for pt. Asked MA to fax that over.  Follow up this coming Wednesday. Asked for my chart update. Based on that might want virtual visit again.

## 2018-11-13 ENCOUNTER — Ambulatory Visit: Payer: Self-pay | Admitting: *Deleted

## 2018-11-13 ENCOUNTER — Telehealth: Payer: Self-pay

## 2018-11-13 NOTE — Telephone Encounter (Signed)
Copied from CRM [id number]. Topic: General - Inquiry >> Nov 10, 2018 12:13 PM Maia Petties wrote: Reason for CRM: Pt called and asked about COVID testing, talked with Herbert Seta and CMA is going to send msg to [facility] for testing. Pt asking what to take for dry cough. >> Nov 10, 2018  2:20 PM Burchel, Abbi R wrote: Pt states she cannot be tested for a Covid-19 until Tues. 11/14/2018. She is requesting a rx for cough, please advise.     Dr Abner Greenspan. Patient has dry cough wants rx for it and also COVID testing  Please advise

## 2018-11-13 NOTE — Telephone Encounter (Signed)
OK to order testing for cough thru [facility] for cough, tessalon perles 200 mg cap, 1 cap po tid prn cough, disp #30 with 1 rf. She might also want an evisit so I can launch the COVID monitoring platform.

## 2018-11-13 NOTE — Telephone Encounter (Signed)
Patient calling stating that she had a televisit on Friday with [name] and her test results came back to day that she was positive for COVID-19. Results not available in Epic. Pt states she was tested on Friday at [facility] facility where she works. Pt states she is still having the same symptoms she experienced during visit on 11/10/18. Pt advised that she would need to have home isolation until no fever for 3 days without the use of fever reducing medicines, respiratory symptoms resolve, and at least 7 days from the start of symptoms.Pt given home care advice to treat symptoms and advised to report any worsening symptoms to PCP. Pt advised to seek treatment in the ED if she experiences increased difficulty breathing.  Pt verbalized understanding.  Pt can be contacted at [phone]. Reason for Disposition . [1] COVID-19 infection diagnosed or suspected AND [2] mild symptoms (fever, cough) AND [3] no trouble breathing or other complications  Answer Assessment - Initial Assessment Questions 1. COVID-19 DIAGNOSIS: "Who made your Coronavirus (COVID-19) diagnosis?" "Was it confirmed by a positive lab test?" If not diagnosed by a HCP, ask "Are there lots of cases (community spread) where you live?" (See public health department website, if unsure)   * MAJOR community spread: high number of cases; numbers of cases are increasing; many people hospitalized.   * MINOR community spread: low number of cases; not increasing; few or no people hospitalized     Yes was confirmed positive today after receiving results from test performed at work on Friday. 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 ONSET: "When did the COVID-19 symptoms start?"      n/a 3. WORST SYMPTOM: "What is your worst symptom?" (e.g., cough, fever, shortness of breath, muscle aches)     Not assessed 4. COUGH: "Do you have a cough?" If so, ask: "How bad is the cough?"       Not  assessed 5. FEVER: "Do you have a fever?" If so, ask: "What is your temperature, how was it measured, and when did it start?"    Not assessed 6. RESPIRATORY STATUS: "Describe your breathing?" (e.g., shortness of breath, wheezing, unable to speak)      Not assessed 7. BETTER-SAME-WORSE: "Are you getting better, staying the same or getting worse compared to yesterday?"  If getting worse, ask, "In what way?"     Not assessed 8. HIGH RISK DISEASE: "Do you have any chronic medical problems?" (e.g., asthma, heart or lung disease, weak immune system, etc.)     Not assessed 9. PREGNANCY: "Is there any chance you are pregnant?" "When was your last menstrual period?"     n/a 10. OTHER SYMPTOMS: "Do you have any other symptoms?"  (e.g., runny nose, headache, sore throat, loss of smell)       Not assessed  Protocols used: CORONAVIRUS (COVID-19) DIAGNOSED OR SUSPECTED-A-AH

## 2018-11-13 NOTE — Telephone Encounter (Signed)
Positive Covid. Please check and see if she needs anything from [place].

## 2018-11-14 ENCOUNTER — Encounter: Payer: Self-pay | Admitting: Medical

## 2018-11-14 ENCOUNTER — Ambulatory Visit: Payer: Managed Care, Other (non HMO) | Admitting: Family Medicine

## 2018-11-14 ENCOUNTER — Encounter: Payer: Self-pay | Admitting: Family Medicine

## 2018-11-14 NOTE — Progress Notes (Deleted)
Carla West - 58 y.o. female MRN [id number]  Date of birth: 1961/01/08  SUBJECTIVE:  Including CC & ROS.  No chief complaint on file.   Carla West is a 58 y.o. female that is  ***.  ***   Review of Systems  HISTORY: Past Medical, Surgical, Social, and Family History Reviewed & Updated per EMR.   Pertinent Historical Findings include:  Past Medical History:  Diagnosis Date  . Abnormal Pap smear of cervix 08/03/2015  . Benign essential HTN 08/03/2015  . History of chicken pox   . Migraine headache 08/03/2015    Past Surgical History:  Procedure Laterality Date  . TUBAL LIGATION      Allergies  Allergen Reactions  . Elavil [Amitriptyline Hcl]     Family History  Problem Relation Age of Onset  . Hypertension Mother   . Heart disease Mother   . Hyperlipidemia Father   . Breast cancer Maternal Grandmother   . Hyperlipidemia Sister   . Other Brother        plan crash in [employer] [place]  . Migraines Son   . Heart disease Brother        CHF, arrythmia  . Hypertension Brother   . Gout Brother   . Hypertension Brother      Social History   Socioeconomic History  . Marital status: Single    Spouse name: Not on file  . Number of children: Not on file  . Years of education: Not on file  . Highest education level: Not on file  Occupational History  . Not on file  Social Needs  . Financial resource strain: Not on file  . Food insecurity:    Worry: Not on file    Inability: Not on file  . Transportation needs:    Medical: Not on file    Non-medical: Not on file  Tobacco Use  . Smoking status: Never Smoker  . Smokeless tobacco: Never Used  Substance and Sexual Activity  . Alcohol use: No  . Drug use: No  . 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 Physical activity:    Days per week: Not on file    Minutes per session: Not on file  . Stress: Not on file   Relationships  . Social connections:    Talks on phone: Not on file    Gets together: Not on file    Attends religious service: Not on file    Active member of club or organization: Not on file    Attends meetings of clubs or organizations: Not on file    Relationship status: Not on file  . Intimate partner violence:    Fear of current or ex partner: Not on file    Emotionally abused: Not on file    Physically abused: Not on file    Forced sexual activity: Not on file  Other Topics Concern  . Not on file  Social History Narrative  . Not on file     PHYSICAL EXAM:  VS: There were no vitals taken for this visit. Physical Exam Gen: NAD, alert, cooperative with exam, well-appearing ENT: normal lips, normal nasal mucosa,  Eye: normal EOM, normal conjunctiva and lids CV:  no edema, +2 pedal pulses   Resp: no accessory muscle use, non-labored,  GI: no masses or tenderness, no hernia  Skin: no rashes, no areas of induration  Neuro: normal tone, normal sensation to  touch Psych:  normal insight, alert and oriented MSK:  ***      ASSESSMENT & PLAN:   No problem-specific Assessment & Plan notes found for this encounter.

## 2018-11-15 MED ORDER — BENZONATATE 200 MG PO CAPS: 200.0000 mg | ORAL_CAPSULE | Freq: Three times a day (TID) | ORAL | 1 refills | Status: DC | PRN

## 2018-11-15 NOTE — Telephone Encounter (Signed)
Test ordered patient was positive Patient made aware

## 2018-11-15 NOTE — Telephone Encounter (Signed)
Sent patient a message via my chart.  

## 2018-11-15 NOTE — Addendum Note (Signed)
Addended by: Crissie Sickles A on: 11/15/2018 01:28 PM   Modules accepted: Orders

## 2018-11-29 ENCOUNTER — Encounter: Payer: Self-pay | Admitting: Medical

## 2018-11-29 ENCOUNTER — Telehealth: Payer: Self-pay | Admitting: Medical

## 2018-11-29 MED ORDER — AZITHROMYCIN 250 MG PO TABS: ORAL_TABLET | ORAL | 0 refills | Status: DC

## 2018-11-29 MED ORDER — BENZONATATE 100 MG PO CAPS: 100.0000 mg | ORAL_CAPSULE | Freq: Three times a day (TID) | ORAL | 0 refills | Status: DC | PRN

## 2018-11-29 NOTE — Telephone Encounter (Signed)
Rx zpack and benzonatate sent to pharmacy.

## 2018-12-06 ENCOUNTER — Telehealth: Payer: Self-pay | Admitting: Family Medicine

## 2018-12-06 NOTE — Telephone Encounter (Signed)
Pt had call the office to schedule an appt, pt was on the phone and the call got cut off. LVM for pt to call the office and schedule the appt.

## 2018-12-06 NOTE — Telephone Encounter (Signed)
Transfer from [facility] was disconnected - called and LM for pt to call office. 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

## 2018-12-08 ENCOUNTER — Other Ambulatory Visit: Payer: Self-pay

## 2018-12-08 ENCOUNTER — Ambulatory Visit: Payer: Managed Care, Other (non HMO) | Admitting: Family Medicine

## 2018-12-11 MED FILL — METOPROLOL TARTRATE 100 MG: 100 | 30 days supply | Qty: 60 | Fill #1

## 2018-12-11 MED FILL — AMLODIPINE BESYLATE 10 MG T: 10 | 30 days supply | Qty: 30 | Fill #5

## 2018-12-12 ENCOUNTER — Other Ambulatory Visit: Payer: Self-pay

## 2018-12-12 ENCOUNTER — Ambulatory Visit (INDEPENDENT_AMBULATORY_CARE_PROVIDER_SITE_OTHER): Payer: Managed Care, Other (non HMO) | Admitting: Medical

## 2018-12-12 DIAGNOSIS — U071 COVID-19: Secondary | ICD-10-CM

## 2018-12-12 DIAGNOSIS — R05 Cough: Secondary | ICD-10-CM | POA: Diagnosis not present

## 2018-12-12 DIAGNOSIS — R195 Other fecal abnormalities: Secondary | ICD-10-CM

## 2018-12-12 DIAGNOSIS — R0600 Dyspnea, unspecified: Secondary | ICD-10-CM

## 2018-12-12 DIAGNOSIS — R059 Cough, unspecified: Secondary | ICD-10-CM

## 2018-12-12 MED ORDER — ALBUTEROL SULFATE HFA 108 (90 BASE) MCG/ACT IN AERS: 2.0000 | INHALATION_SPRAY | Freq: Four times a day (QID) | RESPIRATORY_TRACT | 0 refills | Status: DC | PRN

## 2018-12-12 MED ORDER — FLUTICASONE PROPIONATE HFA 110 MCG/ACT IN AERO: 2.0000 | INHALATION_SPRAY | Freq: Two times a day (BID) | RESPIRATORY_TRACT | 2 refills | Status: DC

## 2018-12-12 MED FILL — ALBUTEROL SULFATE HFA 108 (: 108 (90 BAS | 25 days supply | Qty: 9 | Fill #0

## 2018-12-12 MED FILL — FLOVENT HFA 110 MCG INHALER: 110 | 30 days supply | Qty: 12 | Fill #0

## 2018-12-12 NOTE — Patient Instructions (Addendum)
For hx of covid +, you are getting repeat test today. Let me know the results. If HD does not repeat 2nd test required by your work let me know and I could get that scheuled.  Mild persistent intermittent cough and mild sob rx flovent and albuterol.  I think loose stools will continue to improve as you are getting some formed now.   If you test both times negative then will arrange out pt cxr, stool panel test if diarrhea occurring by next week and consider referral to pulmonologist for pft.   covid new and some pt have lingering symptoms/slow recovery after testing negative.  Follow up date to be determined.

## 2018-12-12 NOTE — Progress Notes (Signed)
   Subjective:    Patient ID: Carla West, female    DOB: Nov 06, 1960, 58 y.o.   MRN: [id number]  HPI  Virtual Visit via Telephone Note  I connected with Rosario Adie on 12/12/18 at 11:20 AM EDT by telephone and verified that I am speaking with the correct person using two identifiers.  Location: Patient: home Provider: office.   I discussed the limitations, risks, security and privacy concerns of performing an evaluation and management service by telephone and the availability of in person appointments. I also discussed with the patient that there may be a patient responsible charge related to this service. The patient expressed understanding and agreed to proceed.  Pt did not check bp today. She will check and then send my chart message so I can update.   History of Present Illness: Pt states she again getting tested for covid. This is 2 weeks post first positive. She retested positive 2nd time after 3rd time since initial test but still positive. She needs to negatives to return to work.  Pt still has some intermittent diarrhea, intermittent cough and mild sob with activity. Pt states her stools is softer and other time bit watery.  I did give azithromycin for infections concerns early as concerned for infection.  Overall she states feeling better but discouraged that still has some symptoms. No fever reported. Her smell has still not returned.      Observations/Objective: General- no acute distress, pleasant. Oriented. Random cough during interview. No labored breathing.  Assessment and Plan: For hx of covid +, you are getting repeat test today. Let me know the results. If HD does not repeat 2nd test required by your work let me know and I could get that scheuled.  Mild persistent intermittent cough and mild sob rx flovent and albuterol.  I think loose stools will continue to improve as you are getting some formed now.   If you test both times negative then will  arrange out pt cxr, stool panel test if diarrhea occurring by next week and consider referral to pulmonologist for pft.   covid new and some pt have lingering symptoms/slow recovery after testing negative.  Follow up date to be determined.  Follow Up Instructions:    I discussed the assessment and treatment plan with the patient. The patient was provided an opportunity to ask questions and all were answered. The patient agreed with the plan and demonstrated an understanding of the instructions.   The patient was advised to call back or seek an in-person evaluation if the symptoms worsen or if the condition fails to improve as anticipated.  I provided 15 minutes of non-face-to-face time during this encounter.   Mackie Pai, PA-C   Review of Systems     Objective:   Physical Exam        Assessment & Plan:

## 2018-12-14 ENCOUNTER — Other Ambulatory Visit: Payer: Self-pay | Admitting: Family Medicine

## 2018-12-14 DIAGNOSIS — M542 Cervicalgia: Secondary | ICD-10-CM

## 2018-12-14 MED FILL — traZODone HCL 50 MG TABS: 50 | 30 days supply | Qty: 30 | Fill #0

## 2018-12-18 ENCOUNTER — Encounter: Payer: Self-pay | Admitting: Medical

## 2018-12-21 ENCOUNTER — Other Ambulatory Visit: Payer: Self-pay

## 2018-12-22 ENCOUNTER — Ambulatory Visit (INDEPENDENT_AMBULATORY_CARE_PROVIDER_SITE_OTHER): Payer: Managed Care, Other (non HMO) | Admitting: Medical

## 2018-12-22 ENCOUNTER — Encounter: Payer: Self-pay | Admitting: Medical

## 2018-12-22 ENCOUNTER — Telehealth: Payer: Self-pay | Admitting: Family Medicine

## 2018-12-22 VITALS — BP 150/90 | Ht 66.0 in | Wt 140.0 lb

## 2018-12-22 DIAGNOSIS — R0600 Dyspnea, unspecified: Secondary | ICD-10-CM

## 2018-12-22 DIAGNOSIS — U071 COVID-19: Secondary | ICD-10-CM | POA: Diagnosis not present

## 2018-12-22 DIAGNOSIS — R195 Other fecal abnormalities: Secondary | ICD-10-CM

## 2018-12-22 NOTE — Progress Notes (Signed)
   Subjective:    Patient ID: Carla West, female    DOB: 06-03-1961, 58 y.o.   MRN: [id number]  HPI  Virtual Visit via Telephone Note  I connected with Rosario Adie on 12/22/18 at 11:20 AM EDT by telephone and verified that I am speaking with the correct person using two identifiers.  Location: Patient: home Provider: office   I discussed the limitations, risks, security and privacy concerns of performing an evaluation and management service by telephone and the availability of in person appointments. I also discussed with the patient that there may be a patient responsible charge related to this service. The patient expressed understanding and agreed to proceed.   History of Present Illness:   Pt states she is better following covid infection. She still has some diarrhea and fatigue. Diarrhea has been progressivley getting better. But she is still getting fatigued around noonshe starts to feel fatigued. She will rest for about 2 hours then start working again. Her taste and smell is improved. She has faint sob after talking a lot on phone. Uses albuterol 2 puffs just once a day.  Early on she had severe cough, very watery stool and severe fatigue. First 3 week of illness.  Pt can return to her work/facility after 2 negative tests. However her tests are still positive.   Observations/Objective: General- No acute distress. Pleasant, oriented, and normal speech.   Assessment and Plan: Glad to hear that you are finally making some improvement/feeling better after about a month of struggling with COVID infection.  However your last test was still positive.  Hopefully your most recent test result is pending will be negative and then you can get set up through the health department for second test.  Currently continue to work at home and take her needed midafternoon break.  Presently only requiring 2 puffs of albuterol once daily.  If her signs symptoms change or worsen please  notify me.  We went through the return to work form today.  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  Copy of form left up front so family member can pick up later today.  Follow-up in 2 weeks virtual/telephone visit or as needed.  Follow Up Instructions:    I discussed the assessment and treatment plan with the patient. The patient was provided an opportunity to ask questions and all were answered. The patient agreed with the plan and demonstrated an understanding of the instructions.   The patient was advised to call back or seek an in-person evaluation if the symptoms worsen or if the condition fails to improve as anticipated.  I provided *25 minutes of non-face-to-face time during this encounter.   Mackie Pai, PA-C   Review of Systems     Objective:   Physical Exam        Assessment & Plan:

## 2018-12-22 NOTE — Telephone Encounter (Signed)
Paperwork to be picked up by Patriciaann Clan, pts sister, since pt is waiting for testing results.

## 2018-12-22 NOTE — Patient Instructions (Signed)
Glad to hear that you are finally making some improvement/feeling better after about a month of struggling with COVID infection.  However your last test was still positive.  Hopefully your most recent test result is pending will be negative and then you can get set up through the health department for second test.  Currently continue to work at home and take her needed midafternoon break.  Presently only requiring 2 puffs of albuterol once daily.  If her signs symptoms change or worsen please notify me.  We went through the return to work form today.  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  Copy of form left up front so family member can pick up later today.  Follow-up in 2 weeks virtual/telephone visit or as needed.

## 2019-01-04 ENCOUNTER — Other Ambulatory Visit: Payer: Self-pay

## 2019-01-04 ENCOUNTER — Ambulatory Visit: Payer: Managed Care, Other (non HMO) | Admitting: Medical

## 2019-01-04 NOTE — Progress Notes (Signed)
   Subjective:    Patient ID: Carla West, female    DOB: [birth year], 58 y.o.   MRN: [id number]  HPI    Review of Systems     Objective:   Physical Exam        Assessment & Plan:

## 2019-01-18 ENCOUNTER — Other Ambulatory Visit: Payer: Self-pay | Admitting: Family Medicine

## 2019-01-18 DIAGNOSIS — I10 Essential (primary) hypertension: Secondary | ICD-10-CM

## 2019-01-18 MED FILL — METOPROLOL TARTRATE 100 MG: 100 | 30 days supply | Qty: 60 | Fill #2

## 2019-01-18 MED FILL — BUTALBITAL-APAP-CAFFEINE 50: 50-325-40 | 8 days supply | Qty: 30 | Fill #0

## 2019-01-18 MED FILL — traZODone HCL 50 MG TABS: 50 | 30 days supply | Qty: 30 | Fill #1

## 2019-01-18 MED FILL — AMITRIPTYLINE HCL 10 MG TAB: 10 | 30 days supply | Qty: 60 | Fill #3

## 2019-01-19 MED FILL — AMLODIPINE BESYLATE 10 MG T: 10 | 30 days supply | Qty: 30 | Fill #0

## 2019-01-22 ENCOUNTER — Encounter: Payer: Self-pay | Admitting: Medical

## 2019-01-22 ENCOUNTER — Ambulatory Visit (INDEPENDENT_AMBULATORY_CARE_PROVIDER_SITE_OTHER): Payer: Managed Care, Other (non HMO) | Admitting: Medical

## 2019-01-22 ENCOUNTER — Other Ambulatory Visit: Payer: Self-pay

## 2019-01-22 DIAGNOSIS — R5383 Other fatigue: Secondary | ICD-10-CM

## 2019-01-22 DIAGNOSIS — R0789 Other chest pain: Secondary | ICD-10-CM

## 2019-01-22 DIAGNOSIS — R197 Diarrhea, unspecified: Secondary | ICD-10-CM

## 2019-01-22 NOTE — Patient Instructions (Signed)
Patient had recent fatigue for about total 6 to 8 weeks following her infection.  We will have her come in tomorrow and will get metabolic panel, CBC, TSH, B12 and B1 level.  Patient is also had intermittent daily loose stools for the past 8 weeks following covid infection as well.  When she is in tomorrow we will get gastro panel.  In addition she has had some chest pressure daily for the last week.  However none today during interview.  No associated cardiac type signs symptoms presently either.  When she is in tomorrow we will get EKG, chest x-ray and a BNP.  Might consider referral to cardiologist.  Did explain to patient that she has recurrent chest pressure or other cardiac type signs and symptoms that are significant and constant and go ahead and go to the emergency department today.  Follow tomorrow or as needed.

## 2019-01-22 NOTE — Progress Notes (Signed)
Subjective:    Patient ID: Carla West, female    DOB: [birth year], 58 y.o.   MRN: [id number]  HPI   Virtual Visit via Video Note  I connected with Carla West on 01/22/19 at  8:00 AM EDT by a video enabled telemedicine application and verified that I am speaking with the correct person using two identifiers.  Location: Patient: office/work Provider: work   I discussed the limitations of evaluation and management by telemedicine and the availability of in person appointments. The patient expressed understanding and agreed to proceed.  Pt did not get vitals yet but will get later at work and send on my chart. Visit done virtually due to pandemic.  History of Present Illness:  Pt in for evaluation.  She states she has persisting fatigue for now going on for about 6-8 weeks since covid infection.  Pt has has some daily diarrhea/loose stools. Occurs 3 times a day.  Pt does have some daily pressure in chest in morning and at night. She states occurs sometimes with activity and sometimes at rest. No jaw pain, no swelling of legs, no jaw pain or shoulder or left arm pain. Pt state she tried th inhaler and it did not help. No current pressure no pedal edema.  Initially speculates fatigue may be sob but not the case.    Observations/Objective: General-no acute distress, pleasant, oriented. Lungs- on inspection lungs appear unlabored. Neck- no tracheal deviation or jvd on inspection. Neuro- gross motor function appears intact.  Assessment and Plan:   Patient had recent fatigue for about total 6 to 8 weeks following her infection.  We will have her come in tomorrow and will get metabolic panel, CBC, TSH, B12 and B1 level.  Patient is also had intermittent daily loose stools for the past 8 weeks following covid infection as well.  When she is in tomorrow we will get gastro panel.  In addition she has had some chest pressure daily for the last week.  However none today during  interview.  No associated cardiac type signs symptoms presently either.  When she is in tomorrow we will get EKG, chest x-ray and a BNP.  Might consider referral to cardiologist.  Did explain to patient that she has recurrent chest pressure or other cardiac type signs and symptoms that are significant and constant and go ahead and go to the emergency department today.  Follow tomorrow or as needed.  Follow Up Instructions:    I discussed the assessment and treatment plan with the patient. The patient was provided an opportunity to ask questions and all were answered. The patient agreed with the plan and demonstrated an understanding of the instructions.   The patient was advised to call back or seek an in-person evaluation if the symptoms worsen or if the condition fails to improve as anticipated.  I provided 25  minutes of non-face-to-face time during this encounter.   Mackie Pai, PA-C     Review of Systems  Constitutional: Positive for fatigue. Negative for chills and fever.  HENT: Negative for congestion, ear discharge and ear pain.   Respiratory: Negative for cough, chest tightness, shortness of breath and wheezing.   Cardiovascular: Negative for chest pain and palpitations.       Atypical chest pressure.  Gastrointestinal: Positive for diarrhea. Negative for abdominal pain, nausea and vomiting.  Genitourinary: Negative for difficulty urinating, dysuria and flank pain.  Musculoskeletal: Negative for back pain, myalgias and neck stiffness.       No  pedal edema.  Skin: Negative for color change and rash.  Neurological: Negative for facial asymmetry, speech difficulty, weakness and light-headedness.  Hematological: Negative for adenopathy. Does not bruise/bleed easily.  Psychiatric/Behavioral: Negative for behavioral problems and decreased concentration.    Past Medical History:  Diagnosis Date  . Abnormal Pap smear of cervix 08/03/2015  . Benign essential HTN 08/03/2015   . History of chicken pox   . Migraine headache 08/03/2015     Social History   Socioeconomic History  . Marital status: Single    Spouse name: Not on file  . Number of children: Not on file  . Years of education: Not on file  . Highest education level: Not on file  Occupational History  . Not on file  Social Needs  . Financial resource strain: Not on file  . Food insecurity    Worry: Not on file    Inability: Not on file  . Transportation needs    Medical: Not on file    Non-medical: Not on file  Tobacco Use  . Smoking status: Never Smoker  . Smokeless tobacco: Never Used  Substance and Sexual Activity  . Alcohol use: No  . Drug use: No  . 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 Physical activity    Days per week: Not on file    Minutes per session: Not on file  . Stress: Not on file  Relationships  . Social Musicianconnections    Talks on phone: Not on file    Gets together: Not on file    Attends religious service: Not on file    Active member of club or organization: Not on file    Attends meetings of clubs or organizations: Not on file    Relationship status: Not on file  . Intimate partner violence    Fear of current or ex partner: Not on file    Emotionally abused: Not on file    Physically abused: Not on file    Forced sexual activity: Not on file  Other Topics Concern  . Not on file  Social History Narrative  . Not on file    Past Surgical History:  Procedure Laterality Date  . TUBAL LIGATION      Family History  Problem Relation Age of Onset  . Hypertension Mother   . Heart disease Mother   . Hyperlipidemia Father   . Breast cancer Maternal Grandmother   . Hyperlipidemia Sister   . Other Brother        plan crash in [employer] [place]  . Migraines Son   . Heart disease Brother        CHF, arrythmia  . Hypertension Brother   . Gout Brother   . Hypertension Brother      Allergies  Allergen Reactions  . Elavil [Amitriptyline Hcl]     Current Outpatient Medications on File Prior to Visit  Medication Sig Dispense Refill  . albuterol (VENTOLIN HFA) 108 (90 Base) MCG/ACT inhaler Inhale 2 puffs into the lungs every 6 (six) hours as needed for wheezing or shortness of breath. 18 g 0  . alendronate (FOSAMAX) 70 MG tablet Take 1 tablet (70 mg total) by mouth every 7 (seven) days. Take with a full glass of water on an empty stomach. 4 tablet 11  . almotriptan (AXERT) 12.5 MG tablet TAKE 1 TABLET BY MOUTH TWICE DAILY AS NEEDED FOR MIGRAINE. MAY REPEAT IN  2 HOURS IF NEEDED 10 tablet 0  . amitriptyline (ELAVIL) 10 MG tablet Take 1-2 tablets (10-20 mg total) by mouth at bedtime. 60 tablet 3  . amLODipine (NORVASC) 10 MG tablet TAKE 1 TABLET BY MOUTH ONCE DAILY 30 tablet 5  . azithromycin (ZITHROMAX) 250 MG tablet Take 2 tablets by mouth on day 1, followed by 1 tablet by mouth daily for 4 days. 6 tablet 0  . benzonatate (TESSALON) 100 MG capsule Take 1 capsule (100 mg total) by mouth 3 (three) times daily as needed for cough. 30 capsule 0  . benzonatate (TESSALON) 200 MG capsule Take 1 capsule (200 mg total) by mouth 3 (three) times daily as needed for cough. 30 capsule 1  . butalbital-acetaminophen-caffeine (FIORICET) 50-325-40 MG tablet TAKE 1 TABLET BY MOUTH EVERY 6 HOURS AS NEEDED FOR HEADACHE 30 tablet 0  . cyclobenzaprine (FLEXERIL) 5 MG tablet Take 0.5-2 tablets (2.5-10 mg total) by mouth at bedtime as needed for muscle spasms. 30 tablet 1  . fluticasone (FLOVENT HFA) 110 MCG/ACT inhaler Inhale 2 puffs into the lungs 2 (two) times daily. 1 Inhaler 2  . hydrALAZINE (APRESOLINE) 25 MG tablet Take 1 tablet (25 mg total) by mouth 3 (three) times daily. 90 tablet 2  . HYDROcodone-acetaminophen (NORCO) 5-325 MG tablet Take 1 tablet by mouth every 6 (six) hours as needed for moderate pain. 16 tablet 0  . ibuprofen (ADVIL,MOTRIN) 800 MG tablet Take 1 tablet (800 mg total)  by mouth every 8 (eight) hours as needed for headache or moderate pain. 21 tablet 0  . metoprolol tartrate (LOPRESSOR) 100 MG tablet TAKE 1 TABLET BY MOUTH TWICE DAILY 60 tablet 3  . olmesartan (BENICAR) 40 MG tablet Take 1 tablet (40 mg total) by mouth daily. 90 tablet 1  . ondansetron (ZOFRAN ODT) 4 MG disintegrating tablet Take 1 tablet (4 mg total) by mouth every 8 (eight) hours as needed for nausea. 20 tablet 0  . promethazine (PHENERGAN) 25 MG tablet Take 1 tablet (25 mg total) by mouth every 8 (eight) hours as needed for nausea or vomiting. 20 tablet 1  . traZODone (DESYREL) 50 MG tablet TAKE 1/2 TO 1 TABLET BY MOUTH AT BEDTIME AS NEEDED FOR SLEEP 30 tablet 4   No current facility-administered medications on file prior to visit.     There were no vitals taken for this visit.      Objective:   Physical Exam        Assessment & Plan:

## 2019-01-23 ENCOUNTER — Ambulatory Visit (INDEPENDENT_AMBULATORY_CARE_PROVIDER_SITE_OTHER): Payer: Managed Care, Other (non HMO)

## 2019-01-23 ENCOUNTER — Encounter: Payer: Self-pay | Admitting: Family Medicine

## 2019-01-23 ENCOUNTER — Telehealth: Payer: Self-pay | Admitting: Medical

## 2019-01-23 ENCOUNTER — Other Ambulatory Visit (INDEPENDENT_AMBULATORY_CARE_PROVIDER_SITE_OTHER): Payer: Managed Care, Other (non HMO)

## 2019-01-23 ENCOUNTER — Ambulatory Visit (HOSPITAL_BASED_OUTPATIENT_CLINIC_OR_DEPARTMENT_OTHER)
Admission: RE | Admit: 2019-01-23 | Discharge: 2019-01-23 | Disposition: A | Discharge status: Home / Self Care | Payer: Managed Care, Other (non HMO) | Source: Ambulatory Visit | Attending: Medical | Admitting: Medical

## 2019-01-23 ENCOUNTER — Other Ambulatory Visit: Payer: Self-pay

## 2019-01-23 ENCOUNTER — Other Ambulatory Visit (HOSPITAL_BASED_OUTPATIENT_CLINIC_OR_DEPARTMENT_OTHER): Payer: Self-pay | Admitting: Family Medicine

## 2019-01-23 DIAGNOSIS — R0789 Other chest pain: Secondary | ICD-10-CM | POA: Diagnosis present

## 2019-01-23 DIAGNOSIS — R5383 Other fatigue: Secondary | ICD-10-CM

## 2019-01-23 DIAGNOSIS — R7989 Other specified abnormal findings of blood chemistry: Secondary | ICD-10-CM

## 2019-01-23 DIAGNOSIS — R079 Chest pain, unspecified: Secondary | ICD-10-CM

## 2019-01-23 DIAGNOSIS — Z1231 Encounter for screening mammogram for malignant neoplasm of breast: Secondary | ICD-10-CM

## 2019-01-23 LAB — CBC
HCT: 33.8 % — ABNORMAL LOW (ref 36.0–46.0)
Hemoglobin: 11.1 g/dL — ABNORMAL LOW (ref 12.0–15.0)
MCHC: 32.9 g/dL (ref 30.0–36.0)
MCV: 81.4 fl (ref 78.0–100.0)
Platelets: 244 10*3/uL (ref 150.0–400.0)
RBC: 4.15 Mil/uL (ref 3.87–5.11)
RDW: 13.7 % (ref 11.5–15.5)
WBC: 3.2 10*3/uL — ABNORMAL LOW (ref 4.0–10.5)

## 2019-01-23 LAB — TROPONIN I: TNIDX: 0.01 ug/l (ref 0.00–0.06)

## 2019-01-23 LAB — BRAIN NATRIURETIC PEPTIDE: Pro B Natriuretic peptide (BNP): 152 pg/mL — ABNORMAL HIGH (ref 0.0–100.0)

## 2019-01-23 LAB — VITAMIN B12: Vitamin B-12: 430 pg/mL (ref 211–911)

## 2019-01-23 LAB — TSH: TSH: <0.01 u[IU]/mL — ABNORMAL LOW (ref 0.35–4.50)

## 2019-01-23 NOTE — Progress Notes (Addendum)
Pre visit review using our clinic review tool, if applicable. No additional management support is needed unless otherwise documented below in the visit note.  Patient here today for EKG with nurse visit. She was under the impression she was seeing edward to discuss ekg, b12 injection, and needed lab work. I have preformed EKG and sent patient to lab. Patient will see edward briefly in room after labs.   Did review ekg results with pt today and explained plan going forward regarding labs and referring to cardiology to evaluate heart function post covid.  Mackie Pai, PA-C

## 2019-01-23 NOTE — Addendum Note (Signed)
Addended by: Hinton Dyer on: 01/23/2019 11:02 AM   Modules accepted: Orders

## 2019-01-23 NOTE — Telephone Encounter (Signed)
Referral to endocrinologist placed. 

## 2019-01-23 NOTE — Addendum Note (Signed)
Addended by: Hinton Dyer on: 01/23/2019 11:00 AM   Modules accepted: Orders

## 2019-01-23 NOTE — Telephone Encounter (Signed)
Referral to cardiologist placed. 

## 2019-01-24 ENCOUNTER — Other Ambulatory Visit (INDEPENDENT_AMBULATORY_CARE_PROVIDER_SITE_OTHER): Payer: Managed Care, Other (non HMO)

## 2019-01-24 DIAGNOSIS — R7989 Other specified abnormal findings of blood chemistry: Secondary | ICD-10-CM

## 2019-01-24 LAB — T4, FREE: Free T4: 4.77 ng/dL — ABNORMAL HIGH (ref 0.60–1.60)

## 2019-01-25 ENCOUNTER — Encounter: Payer: Self-pay | Admitting: Medical

## 2019-01-27 LAB — VITAMIN B1: Vitamin B1 (Thiamine): 14 nmol/L (ref 8–30)

## 2019-01-29 ENCOUNTER — Other Ambulatory Visit: Payer: Self-pay

## 2019-01-29 ENCOUNTER — Encounter: Payer: Self-pay | Admitting: Cardiology

## 2019-01-29 ENCOUNTER — Ambulatory Visit (INDEPENDENT_AMBULATORY_CARE_PROVIDER_SITE_OTHER): Payer: Managed Care, Other (non HMO) | Admitting: Cardiology

## 2019-01-29 VITALS — BP 120/68 | HR 64 | Resp 12 | Ht 67.0 in | Wt 140.0 lb

## 2019-01-29 DIAGNOSIS — I10 Essential (primary) hypertension: Secondary | ICD-10-CM

## 2019-01-29 DIAGNOSIS — R002 Palpitations: Secondary | ICD-10-CM

## 2019-01-29 DIAGNOSIS — R0789 Other chest pain: Secondary | ICD-10-CM | POA: Diagnosis not present

## 2019-01-29 DIAGNOSIS — E785 Hyperlipidemia, unspecified: Secondary | ICD-10-CM

## 2019-01-29 DIAGNOSIS — R079 Chest pain, unspecified: Secondary | ICD-10-CM

## 2019-01-29 MED ORDER — ASPIRIN EC 81 MG PO TBEC: 81.0000 mg | DELAYED_RELEASE_TABLET | Freq: Every day | ORAL | 3 refills | Status: AC

## 2019-01-29 NOTE — Addendum Note (Signed)
Addended by: Polly Cobia A on: 01/29/2019 08:50 AM   Modules accepted: Orders

## 2019-01-29 NOTE — Addendum Note (Signed)
Addended by: Polly Cobia A on: 01/29/2019 09:03 AM   Modules accepted: Orders

## 2019-01-29 NOTE — Patient Instructions (Signed)
Medication Instructions:  Your physician has recommended you make the following change in your medication: START ASPIRIN 81 mg DAILY  If you need a refill on your cardiac medications before your next appointment, please call your pharmacy.   Lab work: NONE If you have labs (blood work) drawn today and your tests are completely normal, you will receive your results only by: Marland Kitchen. MyChart Message (if you have MyChart) OR . A paper copy in the mail If you have any lab test that is abnormal or we need to change your treatment, we will call you to review the results.  Testing/Procedures: Your physician has requested that you have a lexiscan myoview. For further information please visit [email or web address]. Please follow instruction sheet, as given.    Follow-Up: At [facility], you and your health needs are our priority.  As part of our continuing mission to provide you with exceptional heart care, we have created designated Provider Care Teams.  These Care Teams include your primary Cardiologist (physician) and Advanced Practice Providers (APPs -  Physician Assistants and Nurse Practitioners) who all work together to provide you with the care you need, when you need it. . You will need a follow up appointment in 1 months  Any Other Special Instructions Will Be Listed Below (If Applicable).   Cardiac Nuclear Scan A cardiac nuclear scan is a test that measures blood flow to the heart when a person is resting and when he or she is exercising. The test looks for problems such as:  Not enough blood reaching a portion of the heart.  The heart muscle not working normally. You may need this test if:  You have heart disease.  You have had abnormal lab results.  You have had heart surgery or a balloon procedure to open up blocked arteries (angioplasty).  You have chest pain.  You have shortness of breath. In this test, a radioactive dye (tracer) is injected into your bloodstream. After the  tracer has traveled to your heart, an imaging device is used to measure how much of the tracer is absorbed by or distributed to various areas of your heart. This procedure is usually done at a hospital and takes 2-4 hours. Tell a health care provider about:  Any allergies you have.  All medicines you are taking, including vitamins, herbs, eye drops, creams, and over-the-counter medicines.  Any problems you or family members have had with anesthetic medicines.  Any blood disorders you have.  Any surgeries you have had.  Any medical conditions you have.  Whether you are pregnant or may be pregnant. What are the risks? Generally, this is a safe procedure. However, problems may occur, including:  Serious chest pain and heart attack. This is only a risk if the stress portion of the test is done.  Rapid heartbeat.  Sensation of warmth in your chest. This usually passes quickly.  Allergic reaction to the tracer. What happens before the procedure?  Ask your health care provider about changing or stopping your regular medicines. This is especially important if you are taking diabetes medicines or blood thinners.  Follow instructions from your health care provider about eating or drinking restrictions.  Remove your jewelry on the day of the procedure. What happens during the procedure?  An IV will be inserted into one of your veins.  Your health care provider will inject a small amount of radioactive tracer through the IV.  You will wait for 20-40 minutes while the tracer travels through your bloodstream.  Your heart activity will be monitored with an electrocardiogram (ECG).  You will lie down on an exam table.  Images of your heart will be taken for about 15-20 minutes.  You may also have a stress test. For this test, one of the following may be done: ? You will exercise on a treadmill or stationary bike. While you exercise, your heart's activity will be monitored with an ECG,  and your blood pressure will be checked. ? You will be given medicines that will increase blood flow to parts of your heart. This is done if you are unable to exercise.  When blood flow to your heart has peaked, a tracer will again be injected through the IV.  After 20-40 minutes, you will get back on the exam table and have more images taken of your heart.  Depending on the type of tracer used, scans may need to be repeated 3-4 hours later.  Your IV line will be removed when the procedure is over. The procedure may vary among health care providers and hospitals. What happens after the procedure?  Unless your health care provider tells you otherwise, you may return to your normal schedule, including diet, activities, and medicines.  Unless your health care provider tells you otherwise, you may increase your fluid intake. This will help to flush the contrast dye from your body. Drink enough fluid to keep your urine pale yellow.  Ask your health care provider, or the department that is doing the test: ? When will my results be ready? ? How will I get my results? Summary  A cardiac nuclear scan measures the blood flow to the heart when a person is resting and when he or she is exercising.  Tell your health care provider if you are pregnant.  Before the procedure, ask your health care provider about changing or stopping your regular medicines. This is especially important if you are taking diabetes medicines or blood thinners.  After the procedure, unless your health care provider tells you otherwise, increase your fluid intake. This will help flush the contrast dye from your body.  After the procedure, unless your health care provider tells you otherwise, you may return to your normal schedule, including diet, activities, and medicines. This information is not intended to replace advice given to you by your health care provider. Make sure you discuss any questions you have with your  health care provider. Document Released: 06/25/2004 Document Revised: 11/14/2017 Document Reviewed: 11/14/2017 Elsevier Patient Education  2020 [employer]    Aspirin and Your Heart  Aspirin is a medicine that prevents the cells in the blood that are used for clotting, called platelets, from sticking together. Aspirin can be used to help reduce the risk of blood clots, heart attacks, and other heart-related problems. Can I take aspirin? Your health care provider will help you determine whether it is safe and beneficial for you to take aspirin daily. Taking aspirin daily may be helpful if you:  Have had a heart attack or chest pain.  Are at risk for a heart attack.  Have undergone open-heart surgery, such as coronary artery bypass surgery (CABG).  Have had coronary angioplasty or a stent.  Have had certain types of stroke or transient ischemic attack (TIA).  Have peripheral artery disease (PAD).  Have chronic heart rhythm problems such as atrial fibrillation and cannot take an anticoagulant.  Have valve disease or have had surgery on a valve. What are the risks? Daily use of aspirin can cause  side effects. Some of these include:  Bleeding. Bleeding problems can be minor or serious. An example of a minor problem is a cut that does not stop bleeding. An example of a more serious problem is stomach bleeding or, rarely, bleeding into the brain. Your risk of bleeding is increased if you are also taking non-steroidal anti-inflammatory drugs (NSAIDs).  Increased bruising.  Upset stomach.  An allergic reaction. People who have nasal polyps have an increased risk of developing an aspirin allergy. General guidelines  Take aspirin only as told by your health care provider. Make sure that you understand how much you should take and what form you should take. The two forms of aspirin are: ? Non-enteric-coated.This type of aspirin does not have a coating and is absorbed quickly. This type  of aspirin also comes in a chewable form. ? Enteric-coated. This type of aspirin has a coating that releases the medicine very slowly. Enteric-coated aspirin might cause less stomach upset than non-enteric-coated aspirin. This type of aspirin should not be chewed or crushed.  Limit alcohol intake to no more than 1 drink a day for nonpregnant women and 2 drinks a day for men. Drinking alcohol increases your risk of bleeding. One drink equals 12 oz of beer, 5 oz of wine, or 1 oz of hard liquor. Contact a health care provider if you:  Have unusual bleeding or bruising.  Have stomach pain or nausea.  Have ringing in your ears.  Have an allergic reaction that causes: ? Hives. ? Itchy skin. ? Swelling of the lips, tongue, or face. Get help right away if you:  Notice that your bowel movements are bloody, dark red, or black in color.  Vomit or cough up blood.  Have blood in your urine.  Cough, have noisy breathing (wheeze), or feel short of breath.  Have chest pain, especially if the pain spreads to the arms, back, neck, or jaw.  Have a severe headache, or a headache with confusion, or dizziness. These symptoms may represent a serious problem that is an emergency. Do not wait to see if the symptoms will go away. Get medical help right away. Call your local emergency services (911 in the [place] Do not drive yourself to the hospital. Summary  Aspirin can be used to help reduce the risk of blood clots, heart attacks, and other heart-related problems.  Daily use of aspirin can increase your risk of side effects. Your health care provider will help you determine whether it is safe and beneficial for you to take aspirin daily.  Take aspirin only as told by your health care provider. Make sure that you understand how much you can take and what form you can take. This information is not intended to replace advice given to you by your health care provider. Make sure you discuss any questions you  have with your health care provider. Document Released: 05/13/2008 Document Revised: 03/31/2017 Document Reviewed: 03/31/2017 Elsevier Patient Education  2020 [employer].

## 2019-01-29 NOTE — Progress Notes (Signed)
Cardiology Consultation:    Date:  01/29/2019   ID:  Carla West, DOB August 28, 1960, MRN [id number]  PCP:  Bradd CanaryBlyth, Stacey A, MD  Cardiologist:  Gypsy Balsamobert Krasowski, MD   Referring MD: Bradd CanaryBlyth, Stacey A, MD   Chief Complaint  Patient presents with  . Chest Pain  . Abnormal Blood work    History of Present Illness:    Carla FoleySheila Crosson is a 58 y.o. female who is being seen today for the evaluation of chest pain at the request of Bradd CanaryBlyth, Stacey A, MD.  She is referred to us because of atypical chest pain.  She said for last few weeks she has been experiencing chest heaviness that can happen at rest but can also happen with exercise.  Sometimes she can walk with no difficulty but sometimes when she walks she will develop the pain.  It lasts usually few minutes.  There is no shortness of breath there is no sweating associated with this sensation.  Overall she still very active and doing quite well.  Risk factors for coronary artery disease include longstanding hypertension diagnosed at least 10 years ago, dyslipidemia with LDL 133 however her HDL is always very high above 80.  She never smoked but she does have family history of premature coronary artery disease.  She did have cough with infection in March she recovered from it almost completely.  Does not exercise on a regular basis but she does have a dog that she walks on a regular basis.  Past Medical History:  Diagnosis Date  . Abnormal Pap smear of cervix 08/03/2015  . Benign essential HTN 08/03/2015  . History of chicken pox   . Migraine headache 08/03/2015    Past Surgical History:  Procedure Laterality Date  . TUBAL LIGATION      Current Medications: Current Meds  Medication Sig  . albuterol (VENTOLIN HFA) 108 (90 Base) MCG/ACT inhaler Inhale 2 puffs into the lungs every 6 (six) hours as needed for wheezing or shortness of breath.  Marland Kitchen. alendronate (FOSAMAX) 70 MG tablet Take 1 tablet (70 mg total) by mouth every 7 (seven) days. Take  with a full glass of water on an empty stomach.  Marland Kitchen. almotriptan (AXERT) 12.5 MG tablet TAKE 1 TABLET BY MOUTH TWICE DAILY AS NEEDED FOR MIGRAINE. MAY REPEAT IN 2 HOURS IF NEEDED  . amLODipine (NORVASC) 10 MG tablet TAKE 1 TABLET BY MOUTH ONCE DAILY  . butalbital-acetaminophen-caffeine (FIORICET) 50-325-40 MG tablet TAKE 1 TABLET BY MOUTH EVERY 6 HOURS AS NEEDED FOR HEADACHE  . cyclobenzaprine (FLEXERIL) 5 MG tablet Take 0.5-2 tablets (2.5-10 mg total) by mouth at bedtime as needed for muscle spasms.  Marland Kitchen. HYDROcodone-acetaminophen (NORCO) 5-325 MG tablet Take 1 tablet by mouth every 6 (six) hours as needed for moderate pain.  Marland Kitchen. ibuprofen (ADVIL,MOTRIN) 800 MG tablet Take 1 tablet (800 mg total) by mouth every 8 (eight) hours as needed for headache or moderate pain.  . metoprolol tartrate (LOPRESSOR) 100 MG tablet TAKE 1 TABLET BY MOUTH TWICE DAILY  . ondansetron (ZOFRAN ODT) 4 MG disintegrating tablet Take 1 tablet (4 mg total) by mouth every 8 (eight) hours as needed for nausea.  . promethazine (PHENERGAN) 25 MG tablet Take 1 tablet (25 mg total) by mouth every 8 (eight) hours as needed for nausea or vomiting.  . traZODone (DESYREL) 50 MG tablet TAKE 1/2 TO 1 TABLET BY MOUTH AT BEDTIME AS NEEDED FOR SLEEP     Allergies:   Elavil [amitriptyline hcl]   Social  History   Socioeconomic History  . Marital status: Single    Spouse name: Not on file  . Number of children: Not on file  . Years of education: Not on file  . Highest education level: Not on file  Occupational History  . Not on file  Social Needs  . Financial resource strain: Not on file  . Food insecurity    Worry: Not on file    Inability: Not on file  . Transportation needs    Medical: Not on file    Non-medical: Not on file  Tobacco Use  . Smoking status: Never Smoker  . Smokeless tobacco: Never Used  Substance and Sexual Activity  . Alcohol use: No  . Drug use: No  . 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 Physical activity    Days per week: Not on file    Minutes per session: Not on file  . Stress: Not on file  Relationships  . Social Herbalist on phone: Not on file    Gets together: Not on file    Attends religious service: Not on file    Active member of club or organization: Not on file    Attends meetings of clubs or organizations: Not on file    Relationship status: Not on file  Other Topics Concern  . Not on file  Social History Narrative  . Not on file     Family History: The patient's family history includes Breast cancer in her maternal grandmother; Gout in her brother; Heart disease in her brother and mother; Hyperlipidemia in her father and sister; Hypertension in her brother, brother, and mother; Migraines in her son; Other in her brother. ROS:   Please see the history of present illness.    All 14 point review of systems negative except as described per history of present illness.  EKGs/Labs/Other Studies Reviewed:    The following studies were reviewed today: EKG reviewed from August of this year showed normal sinus rhythm normal P interval, criteria for LVH with repolarization abnormalities.    Recent Labs: 06/12/2018: ALT 33; BUN 13; Creatinine, Ser 0.65; Potassium 3.9; Sodium 137 01/23/2019: Hemoglobin 11.1; Platelets 244.0; Pro B Natriuretic peptide (BNP) 152.0; TSH <0.01  Recent Lipid Panel    Component Value Date/Time   CHOL 228 (H) 04/11/2018 0821   TRIG 70.0 04/11/2018 0821   HDL 80.60 04/11/2018 0821   CHOLHDL 3 04/11/2018 0821   VLDL 14.0 04/11/2018 0821   LDLCALC 133 (H) 04/11/2018 0821    Physical Exam:    VS:  BP 120/68   Pulse 64   Resp 12   Ht 5\' 7"  (1.702 m)   Wt 140 lb (63.5 kg)   BMI 21.93 kg/m     Wt Readings from Last 3 Encounters:  01/29/19 140 lb (63.5 kg)  12/22/18 140 lb (63.5 kg)  06/12/18 150 lb (68 kg)     GEN:  Well nourished, well  developed in no acute distress HEENT: Normal NECK: No JVD; No carotid bruits LYMPHATICS: No lymphadenopathy CARDIAC: RRR, no murmurs, no rubs, no gallops RESPIRATORY:  Clear to auscultation without rales, wheezing or rhonchi  ABDOMEN: Soft, non-tender, non-distended MUSCULOSKELETAL:  No edema; No deformity  SKIN: Warm and dry NEUROLOGIC:  Alert and oriented x 3 PSYCHIATRIC:  Normal affect   ASSESSMENT:    1. Essential hypertension   2. Atypical chest pain  3. Palpitations   4. Hyperlipidemia, unspecified hyperlipidemia type    PLAN:    In order of problems listed above:  1. Essential hypertension.  Blood pressure appears to be well controlled today.  I asked her to check blood pressure on a regular basis and then bring results to me next time when she be here. 2. Atypical chest pain.  She does have risk factors for coronary artery disease.  I think the best approach to this would be proceed with stress testing.  I described procedure to her she is willing to proceed.  We discussed also alternative meaning medical therapy versus even cardiac catheterization or CT Angie of her chest.  We elected to proceed with stress testing.  I asked her to start taking baby aspirin every single day until we have stress test available. 3. Palpitations denies having any 4. Hyperlipidemia with LDL 133 however HDL is always very good.  Decision regarding treatment will be made based on results of her stress test. 5. I told her to keep walking however not to the point that she developed pain.  I also told her she need to go to the emergency room if pain does not subside with rest.   Medication Adjustments/Labs and Tests Ordered: Current medicines are reviewed at length with the patient today.  Concerns regarding medicines are outlined above.  No orders of the defined types were placed in this encounter.  No orders of the defined types were placed in this encounter.   Signed, Georgeanna Leaobert J. Krasowski,  MD, [facility]. 01/29/2019 8:42 AM    [facility]

## 2019-01-31 ENCOUNTER — Telehealth (HOSPITAL_COMMUNITY): Payer: Self-pay

## 2019-01-31 NOTE — Telephone Encounter (Signed)
Left instructions on her answering machine. Asked to call back with any questions. S.Oday Ridings EMTP

## 2019-02-01 ENCOUNTER — Encounter (HOSPITAL_COMMUNITY): Payer: Self-pay

## 2019-02-01 ENCOUNTER — Other Ambulatory Visit: Payer: Self-pay

## 2019-02-01 ENCOUNTER — Ambulatory Visit (HOSPITAL_COMMUNITY): Payer: Managed Care, Other (non HMO) | Attending: Cardiology

## 2019-02-01 VITALS — Ht 67.0 in | Wt 140.0 lb

## 2019-02-01 DIAGNOSIS — R0789 Other chest pain: Secondary | ICD-10-CM | POA: Insufficient documentation

## 2019-02-01 LAB — MYOCARDIAL PERFUSION IMAGING
LV dias vol: 92 mL (ref 46–106)
LV sys vol: 20 mL
Peak HR: 100 {beats}/min
Rest HR: 74 {beats}/min
SDS: 3
SRS: 2
SSS: 5
TID: 0.97

## 2019-02-01 MED ORDER — TECHNETIUM TC 99M TETROFOSMIN IV KIT
10.2000 | PACK | Freq: Once | INTRAVENOUS | Status: AC | PRN
  Administered 2019-02-01: 10.2 via INTRAVENOUS
  Filled 2019-02-01: qty 11

## 2019-02-01 MED ORDER — TECHNETIUM TC 99M TETROFOSMIN IV KIT
32.7000 | PACK | Freq: Once | INTRAVENOUS | Status: AC | PRN
  Administered 2019-02-01: 32.7 via INTRAVENOUS
  Filled 2019-02-01: qty 33

## 2019-02-01 MED ORDER — REGADENOSON 0.4 MG/5ML IV SOLN
0.4000 mg | Freq: Once | INTRAVENOUS | Status: AC
  Administered 2019-02-01: 0.4 mg via INTRAVENOUS

## 2019-02-02 ENCOUNTER — Encounter: Payer: Self-pay | Admitting: Medical

## 2019-02-15 ENCOUNTER — Encounter: Payer: Self-pay | Admitting: Medical

## 2019-02-15 ENCOUNTER — Ambulatory Visit (HOSPITAL_BASED_OUTPATIENT_CLINIC_OR_DEPARTMENT_OTHER)
Admission: RE | Admit: 2019-02-15 | Discharge: 2019-02-15 | Disposition: A | Discharge status: Home / Self Care | Payer: Managed Care, Other (non HMO) | Source: Ambulatory Visit | Attending: Medical | Admitting: Medical

## 2019-02-15 ENCOUNTER — Other Ambulatory Visit: Payer: Self-pay

## 2019-02-15 ENCOUNTER — Ambulatory Visit (INDEPENDENT_AMBULATORY_CARE_PROVIDER_SITE_OTHER): Payer: Managed Care, Other (non HMO) | Admitting: Medical

## 2019-02-15 VITALS — BP 125/79 | HR 82 | Temp 97.7°F | Resp 16 | Ht 67.0 in | Wt 139.0 lb

## 2019-02-15 DIAGNOSIS — R0789 Other chest pain: Secondary | ICD-10-CM

## 2019-02-15 DIAGNOSIS — Z1211 Encounter for screening for malignant neoplasm of colon: Secondary | ICD-10-CM

## 2019-02-15 DIAGNOSIS — R197 Diarrhea, unspecified: Secondary | ICD-10-CM | POA: Diagnosis not present

## 2019-02-15 DIAGNOSIS — R0600 Dyspnea, unspecified: Secondary | ICD-10-CM | POA: Insufficient documentation

## 2019-02-15 DIAGNOSIS — R5383 Other fatigue: Secondary | ICD-10-CM

## 2019-02-15 DIAGNOSIS — R109 Unspecified abdominal pain: Secondary | ICD-10-CM

## 2019-02-15 LAB — CBC WITH DIFFERENTIAL/PLATELET
Basophils Absolute: 0 10*3/uL (ref 0.0–0.1)
Basophils Relative: 0.3 % (ref 0.0–3.0)
Eosinophils Absolute: 0 10*3/uL (ref 0.0–0.7)
Eosinophils Relative: 1.1 % (ref 0.0–5.0)
HCT: 31.6 % — ABNORMAL LOW (ref 36.0–46.0)
Hemoglobin: 10.5 g/dL — ABNORMAL LOW (ref 12.0–15.0)
Lymphocytes Relative: 34.4 % (ref 12.0–46.0)
Lymphs Abs: 1.1 10*3/uL (ref 0.7–4.0)
MCHC: 33.3 g/dL (ref 30.0–36.0)
MCV: 81.2 fl (ref 78.0–100.0)
Monocytes Absolute: 0.6 10*3/uL (ref 0.1–1.0)
Monocytes Relative: 17.5 % — ABNORMAL HIGH (ref 3.0–12.0)
Neutro Abs: 1.5 10*3/uL (ref 1.4–7.7)
Neutrophils Relative %: 46.7 % (ref 43.0–77.0)
Platelets: 215 10*3/uL (ref 150.0–400.0)
RBC: 3.89 Mil/uL (ref 3.87–5.11)
RDW: 13.9 % (ref 11.5–15.5)
WBC: 3.1 10*3/uL — ABNORMAL LOW (ref 4.0–10.5)

## 2019-02-15 LAB — COMPREHENSIVE METABOLIC PANEL
ALT: 33 U/L (ref 0–35)
AST: 24 U/L (ref 0–37)
Albumin: 3.6 g/dL (ref 3.5–5.2)
Alkaline Phosphatase: 124 U/L — ABNORMAL HIGH (ref 39–117)
BUN: 13 mg/dL (ref 6–23)
CO2: 28 mEq/L (ref 19–32)
Calcium: 9.6 mg/dL (ref 8.4–10.5)
Chloride: 108 mEq/L (ref 96–112)
Creatinine, Ser: 0.52 mg/dL (ref 0.40–1.20)
GFR: 147.56 mL/min (ref >60.00)
Glucose, Bld: 86 mg/dL (ref 70–99)
Potassium: 3.8 mEq/L (ref 3.5–5.1)
Sodium: 142 mEq/L (ref 135–145)
Total Bilirubin: 0.4 mg/dL (ref 0.2–1.2)
Total Protein: 6.6 g/dL (ref 6.0–8.3)

## 2019-02-15 LAB — TROPONIN I (HIGH SENSITIVITY): High Sens Troponin I: 7 ng/L (ref 2–17)

## 2019-02-15 NOTE — Patient Instructions (Addendum)
You do have some intermittent atypical chest pain that has been worked up by cardiologist who ordered stress test.  You were informed of no abnormality with those results.  However you do have some symptoms of mild shortness of breath as well as left shoulder pain with some pain radiating down your left arm when you have those advance at daily.  No current symptoms presently but reports some yesterday morning.  We did EKG which showed normal sinus rhythm with no ischemia.  Repeated one troponin test that since the symptoms were 24 hours ago.  That result was negative and I will send no to cardiologist to inform him of your persisting symptoms.  Since you do have some fatigue and dyspnea at times post COVID along with family history of heart failure, I do think it would be worthwhile to get echocardiogram.  Order was placed.  I believe we need prior authorization before that you schedule at the med center.  Please call us back on Monday for update on the appointment and prior authorization approval.  If you have any constant severe pain/worsening pain then recommend ED evaluation.  If you have abnormal echocardiogram then would refer to Dr. Haroldine Laws as you report he sees other family members for CHF.  For intermittent daily loose stools with meals and mild abdomen discomfort, will get gastro panel, CBC and metabolic panel.  Your blood work did show some mild anemia so we will ask lab to add iron to your lab review.  For abnormal thyroid studies/low TSH, attend upcoming endocrinologist appointment.  On review it appears that no colonoscopy on record so we will go ahead and refer you to gastroenterologist.  Follow-up in 2 to 3 weeks or as needed.

## 2019-02-15 NOTE — Progress Notes (Signed)
Subjective:    Patient ID: Carla West, female    DOB: [birth year], 58 y.o.   MRN: [id number]  HPI  Pt in for follow up.   I have seen pt numerous times recently and asked her to see pcp on record. She informed me that I will be her pcp.  She states that she made this change last year and notified's but changes not made in epic.  Pt state she is still having fatigue daily. She describes feeling occasional shortness of breath at times with chest pressure. Some pain that radiates to her left arm. This happens about twice a day and might last for about 10-15 minutes. Last time she had this was yesterday at work. Her shoulder hurts a litte bit when that happens. Some pain that runs down medial aspect of left arm to hands at times. Pt brother and mother both had heart failure.  Pt had stress test. It reports 64% of max reached.   Pt is still have diarrhea after eating. Has 3 loose stools a day.   See last note pt has had covid in past. She is working now. Note results came back as negative.   Pt had abnormal tsh values. Pt has gotten called by endocrine. Appointment is upcoming.  Hx of migraines. She states migraine type ha about once every week. On meds.  Fatigue work up was negative except low tsh.   Review of Systems  Constitutional: Positive for diaphoresis. Negative for chills, fatigue and fever.       Pt lost weight with covid.  Some occasional sweats.  In menopause for years per pt.  Respiratory: Negative for chest tightness, shortness of breath and wheezing.   Cardiovascular: Negative for chest pain and palpitations.  Gastrointestinal: Positive for abdominal pain. Negative for blood in stool, diarrhea, nausea and vomiting.  Musculoskeletal: Negative for back pain.  Skin: Negative for rash.  Neurological: Negative for dizziness, numbness and headaches.  Hematological: Negative for adenopathy. Does not bruise/bleed easily.  Psychiatric/Behavioral: Negative for behavioral  problems, decreased concentration and dysphoric mood.    Past Medical History:  Diagnosis Date  . Abnormal Pap smear of cervix 08/03/2015  . Benign essential HTN 08/03/2015  . History of chicken pox   . Migraine headache 08/03/2015     Social History   Socioeconomic History  . Marital status: Divorced    Spouse name: Not on file  . Number of children: Not on file  . Years of education: Not on file  . Highest education level: Not on file  Occupational History  . Not on file  Social Needs  . Financial resource strain: Not on file  . Food insecurity    Worry: Not on file    Inability: Not on file  . Transportation needs    Medical: Not on file    Non-medical: Not on file  Tobacco Use  . Smoking status: Never Smoker  . Smokeless tobacco: Never Used  Substance and Sexual Activity  . Alcohol use: No  . Drug use: No  . 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 Physical activity    Days per week: Not on file    Minutes per session: Not on file  . Stress: Not on file  Relationships  . Social Herbalist on phone: Not on file    Gets together: Not on file    Attends religious service:  Not on file    Active member of club or organization: Not on file    Attends meetings of clubs or organizations: Not on file    Relationship status: Not on file  . Intimate partner violence    Fear of current or ex partner: Not on file    Emotionally abused: Not on file    Physically abused: Not on file    Forced sexual activity: Not on file  Other Topics Concern  . Not on file  Social History Narrative  . Not on file    Past Surgical History:  Procedure Laterality Date  . TUBAL LIGATION      Family History  Problem Relation Age of Onset  . Hypertension Mother   . Heart disease Mother   . Hyperlipidemia Father   . Breast cancer Maternal Grandmother   . Hyperlipidemia Sister   .  Other Brother        plan crash in [employer]  . Migraines Son   . Heart disease Brother        CHF, arrythmia  . Hypertension Brother   . Gout Brother   . Hypertension Brother     Allergies  Allergen Reactions  . Elavil [Amitriptyline Hcl]     Current Outpatient Medications on File Prior to Visit  Medication Sig Dispense Refill  . albuterol (VENTOLIN HFA) 108 (90 Base) MCG/ACT inhaler Inhale 2 puffs into the lungs every 6 (six) hours as needed for wheezing or shortness of breath. 18 g 0  . alendronate (FOSAMAX) 70 MG tablet Take 1 tablet (70 mg total) by mouth every 7 (seven) days. Take with a full glass of water on an empty stomach. 4 tablet 11  . almotriptan (AXERT) 12.5 MG tablet TAKE 1 TABLET BY MOUTH TWICE DAILY AS NEEDED FOR MIGRAINE. MAY REPEAT IN 2 HOURS IF NEEDED 10 tablet 0  . amLODipine (NORVASC) 10 MG tablet TAKE 1 TABLET BY MOUTH ONCE DAILY 30 tablet 5  . aspirin EC 81 MG tablet Take 1 tablet (81 mg total) by mouth daily. 90 tablet 3  . butalbital-acetaminophen-caffeine (FIORICET) 50-325-40 MG tablet TAKE 1 TABLET BY MOUTH EVERY 6 HOURS AS NEEDED FOR HEADACHE 30 tablet 0  . cyclobenzaprine (FLEXERIL) 5 MG tablet Take 0.5-2 tablets (2.5-10 mg total) by mouth at bedtime as needed for muscle spasms. 30 tablet 1  . HYDROcodone-acetaminophen (NORCO) 5-325 MG tablet Take 1 tablet by mouth every 6 (six) hours as needed for moderate pain. 16 tablet 0  . ibuprofen (ADVIL,MOTRIN) 800 MG tablet Take 1 tablet (800 mg total) by mouth every 8 (eight) hours as needed for headache or moderate pain. 21 tablet 0  . metoprolol tartrate (LOPRESSOR) 100 MG tablet TAKE 1 TABLET BY MOUTH TWICE DAILY 60 tablet 3  . ondansetron (ZOFRAN ODT) 4 MG disintegrating tablet Take 1 tablet (4 mg total) by mouth every 8 (eight) hours as needed for nausea. 20 tablet 0  . promethazine (PHENERGAN) 25 MG tablet Take 1 tablet (25 mg total) by mouth every 8 (eight) hours as needed for nausea or vomiting. 20 tablet 1   . traZODone (DESYREL) 50 MG tablet TAKE 1/2 TO 1 TABLET BY MOUTH AT BEDTIME AS NEEDED FOR SLEEP 30 tablet 4   No current facility-administered medications on file prior to visit.     There were no vitals taken for this visit.      Objective:   Physical Exam  General Mental Status- Alert. General Appearance- Not in acute distress.  Skin General: Color- Normal Color. Moisture- Normal Moisture.  Neck Carotid Arteries- Normal color. Moisture- Normal Moisture. No carotid bruits. No JVD.  Chest and Lung Exam Auscultation: Breath Sounds:-Normal.  Cardiovascular Auscultation:Rythm- Regular. Murmurs & Other Heart Sounds:Auscultation of the heart reveals- No Murmurs.  Abdomen Inspection:-Inspeection Normal. Palpation/Percussion:Note:No mass. Palpation and Percussion of the abdomen reveal- Non Tender, Non Distended + BS, no rebound or guarding.    Neurologic Cranial Nerve exam:- CN III-XII intact(No nystagmus), symmetric smile. Drift Test:- No drift. Romberg Exam:- Negative.  Heal to Toe Gait exam:-Normal. Finger to Nose:- Normal/Intact Strength:- 5/5 equal and symmetric strength both upper and lower extremities.     Assessment & Plan:   You do have some intermittent atypical chest pain that has been worked up by cardiologist who ordered stress test.  You were informed of no abnormality with those results.  However you do have some symptoms of mild shortness of breath as well as left shoulder pain with some pain radiating down your left arm when you have those advance at daily.  No current symptoms presently but reports some yesterday morning.  We did EKG which showed normal sinus rhythm with no ischemia.  Repeated one troponin test that since the symptoms were 24 hours ago.  That result was negative and I will send no to cardiologist to inform him of your persisting symptoms.  Since you do have some fatigue and dyspnea at times post COVID along with family history of heart  failure, I do think it would be worthwhile to get echocardiogram.  Order was placed.  I believe we need prior authorization before that you schedule at the med center.  Please call us back on Monday for update on the appointment and prior authorization approval.  If you have any constant severe pain/worsening pain then recommend ED evaluation.  If you have abnormal echocardiogram then would refer to Dr. Gala Romney as you report he sees other family members for CHF.  For intermittent daily loose stools with meals and mild abdomen discomfort, will get gastro panel, CBC and metabolic panel.  Your blood work did show some mild anemia so we will ask lab to add iron to your lab review.  For abnormal thyroid studies/low TSH, attend upcoming endocrinologist appointment.  On review it appears that no colonoscopy on record so we will go ahead and refer you to gastroenterologist.  Follow-up in 2 to 3 weeks or as needed.  [name], PA-C   40 minutes spent with patient today.  50% of time spent counseling patient diagnoses and plan going forward.         Follow up 2-3 weeks.

## 2019-02-16 ENCOUNTER — Encounter: Payer: Self-pay | Admitting: Medical

## 2019-02-20 ENCOUNTER — Ambulatory Visit (HOSPITAL_BASED_OUTPATIENT_CLINIC_OR_DEPARTMENT_OTHER): Payer: Managed Care, Other (non HMO)

## 2019-02-22 ENCOUNTER — Other Ambulatory Visit (HOSPITAL_BASED_OUTPATIENT_CLINIC_OR_DEPARTMENT_OTHER): Payer: Self-pay | Admitting: Family Medicine

## 2019-02-22 ENCOUNTER — Other Ambulatory Visit: Payer: Self-pay

## 2019-02-22 ENCOUNTER — Other Ambulatory Visit (HOSPITAL_COMMUNITY): Payer: Self-pay | Admitting: Medical

## 2019-02-22 ENCOUNTER — Telehealth: Payer: Self-pay | Admitting: Medical

## 2019-02-22 ENCOUNTER — Ambulatory Visit (HOSPITAL_BASED_OUTPATIENT_CLINIC_OR_DEPARTMENT_OTHER)
Admission: RE | Admit: 2019-02-22 | Discharge: 2019-02-22 | Disposition: A | Discharge status: Home / Self Care | Payer: Managed Care, Other (non HMO) | Source: Ambulatory Visit | Attending: Medical | Admitting: Medical

## 2019-02-22 DIAGNOSIS — R0600 Dyspnea, unspecified: Secondary | ICD-10-CM | POA: Insufficient documentation

## 2019-02-22 DIAGNOSIS — D649 Anemia, unspecified: Secondary | ICD-10-CM

## 2019-02-22 DIAGNOSIS — R5383 Other fatigue: Secondary | ICD-10-CM | POA: Insufficient documentation

## 2019-02-22 NOTE — Telephone Encounter (Signed)
Dr. Agustin Cree,  Carla West is still having chest pressure that is associated with left arm pain. I understand her initial testing was negative. Repeat ekg showed non specific t wave changes and negative troponin.  I ordered echo after you saw her since she kept expressing concern about her heart and mentioned fh of heart failure. The echo mentioned normal EF.   Wanted to keep you updated if you wanted to expand work up.  Thanks,  Mackie Pai, PA-C

## 2019-02-22 NOTE — Telephone Encounter (Signed)
Opened to review 

## 2019-02-22 NOTE — Telephone Encounter (Signed)
Future cbc and iron studies placed. 

## 2019-02-22 NOTE — Progress Notes (Signed)
  Echocardiogram 2D Echocardiogram has been performed.  Carla West 02/22/2019, 8:52 AM

## 2019-02-23 ENCOUNTER — Ambulatory Visit (HOSPITAL_COMMUNITY)
Admission: RE | Admit: 2019-02-23 | Discharge: 2019-02-23 | Disposition: A | Discharge status: Home / Self Care | Payer: Managed Care, Other (non HMO) | Source: Ambulatory Visit | Attending: Medical | Admitting: Medical

## 2019-02-23 DIAGNOSIS — I10 Essential (primary) hypertension: Secondary | ICD-10-CM | POA: Diagnosis not present

## 2019-02-23 DIAGNOSIS — I340 Nonrheumatic mitral (valve) insufficiency: Secondary | ICD-10-CM | POA: Insufficient documentation

## 2019-02-23 DIAGNOSIS — R0600 Dyspnea, unspecified: Secondary | ICD-10-CM | POA: Diagnosis present

## 2019-02-23 DIAGNOSIS — E785 Hyperlipidemia, unspecified: Secondary | ICD-10-CM | POA: Diagnosis not present

## 2019-02-23 NOTE — Progress Notes (Signed)
  Echocardiogram 2D Echocardiogram has been performed.  Carla West 02/23/2019, 9:31 AM

## 2019-02-26 ENCOUNTER — Ambulatory Visit: Payer: Managed Care, Other (non HMO) | Admitting: Internal Medicine

## 2019-02-26 ENCOUNTER — Emergency Department (HOSPITAL_COMMUNITY): Payer: Managed Care, Other (non HMO)

## 2019-02-26 ENCOUNTER — Encounter: Payer: Self-pay | Admitting: Internal Medicine

## 2019-02-26 ENCOUNTER — Other Ambulatory Visit: Payer: Self-pay

## 2019-02-26 ENCOUNTER — Encounter (HOSPITAL_COMMUNITY): Payer: Self-pay | Admitting: Emergency Medicine

## 2019-02-26 ENCOUNTER — Inpatient Hospital Stay (HOSPITAL_COMMUNITY)
Admission: EM | Admit: 2019-02-26 | Discharge: 2019-02-28 | DRG: 247 | Disposition: A | Discharge status: Home / Self Care | Payer: Managed Care, Other (non HMO) | Attending: Internal Medicine | Admitting: Internal Medicine

## 2019-02-26 VITALS — BP 120/80 | HR 94 | Ht 67.0 in | Wt 139.0 lb

## 2019-02-26 DIAGNOSIS — Z8619 Personal history of other infectious and parasitic diseases: Secondary | ICD-10-CM

## 2019-02-26 DIAGNOSIS — E785 Hyperlipidemia, unspecified: Secondary | ICD-10-CM | POA: Diagnosis present

## 2019-02-26 DIAGNOSIS — Z7983 Long term (current) use of bisphosphonates: Secondary | ICD-10-CM

## 2019-02-26 DIAGNOSIS — Z79899 Other long term (current) drug therapy: Secondary | ICD-10-CM

## 2019-02-26 DIAGNOSIS — E0590 Thyrotoxicosis, unspecified without thyrotoxic crisis or storm: Secondary | ICD-10-CM | POA: Diagnosis not present

## 2019-02-26 DIAGNOSIS — Z8249 Family history of ischemic heart disease and other diseases of the circulatory system: Secondary | ICD-10-CM

## 2019-02-26 DIAGNOSIS — I214 Non-ST elevation (NSTEMI) myocardial infarction: Principal | ICD-10-CM | POA: Diagnosis present

## 2019-02-26 DIAGNOSIS — E876 Hypokalemia: Secondary | ICD-10-CM | POA: Diagnosis present

## 2019-02-26 DIAGNOSIS — G43909 Migraine, unspecified, not intractable, without status migrainosus: Secondary | ICD-10-CM | POA: Diagnosis present

## 2019-02-26 DIAGNOSIS — Z888 Allergy status to other drugs, medicaments and biological substances status: Secondary | ICD-10-CM

## 2019-02-26 DIAGNOSIS — Z955 Presence of coronary angioplasty implant and graft: Secondary | ICD-10-CM

## 2019-02-26 DIAGNOSIS — I10 Essential (primary) hypertension: Secondary | ICD-10-CM | POA: Diagnosis present

## 2019-02-26 DIAGNOSIS — Z7982 Long term (current) use of aspirin: Secondary | ICD-10-CM

## 2019-02-26 DIAGNOSIS — Z79891 Long term (current) use of opiate analgesic: Secondary | ICD-10-CM

## 2019-02-26 DIAGNOSIS — F419 Anxiety disorder, unspecified: Secondary | ICD-10-CM | POA: Diagnosis present

## 2019-02-26 DIAGNOSIS — Z8349 Family history of other endocrine, nutritional and metabolic diseases: Secondary | ICD-10-CM

## 2019-02-26 LAB — CBC
HCT: 31.9 % — ABNORMAL LOW (ref 36.0–46.0)
Hemoglobin: 10.3 g/dL — ABNORMAL LOW (ref 12.0–15.0)
MCH: 26.7 pg (ref 26.0–34.0)
MCHC: 32.3 g/dL (ref 30.0–36.0)
MCV: 82.6 fL (ref 80.0–100.0)
Platelets: 211 10*3/uL (ref 150–400)
RBC: 3.86 MIL/uL — ABNORMAL LOW (ref 3.87–5.11)
RDW: 13.7 % (ref 11.5–15.5)
WBC: 5.8 10*3/uL (ref 4.0–10.5)
nRBC: 0 % (ref 0.0–0.2)

## 2019-02-26 LAB — BASIC METABOLIC PANEL
Anion gap: 11 (ref 5–15)
BUN: 20 mg/dL (ref 6–20)
CO2: 20 mmol/L — ABNORMAL LOW (ref 22–32)
Calcium: 9.1 mg/dL (ref 8.9–10.3)
Chloride: 110 mmol/L (ref 98–111)
Creatinine, Ser: 0.5 mg/dL (ref 0.44–1.00)
GFR calc Af Amer: >60 mL/min (ref >60)
GFR calc non Af Amer: >60 mL/min (ref >60)
Glucose, Bld: 109 mg/dL — ABNORMAL HIGH (ref 70–99)
Potassium: 3 mmol/L — ABNORMAL LOW (ref 3.5–5.1)
Sodium: 141 mmol/L (ref 135–145)

## 2019-02-26 LAB — TROPONIN I (HIGH SENSITIVITY): Troponin I (High Sensitivity): 29 ng/L — ABNORMAL HIGH (ref <18)

## 2019-02-26 MED ORDER — NITROGLYCERIN 2 % TD OINT
1.0000 [in_us] | TOPICAL_OINTMENT | Freq: Once | TRANSDERMAL | Status: AC
  Administered 2019-02-26: 1 [in_us] via TOPICAL
  Filled 2019-02-26: qty 1

## 2019-02-26 MED ORDER — METOPROLOL TARTRATE 100 MG PO TABS: 100.0000 mg | ORAL_TABLET | Freq: Every day | ORAL | 3 refills | Status: DC

## 2019-02-26 MED ORDER — MORPHINE SULFATE (PF) 4 MG/ML IV SOLN
4.0000 mg | Freq: Once | INTRAVENOUS | Status: AC
  Administered 2019-02-26: 4 mg via INTRAVENOUS
  Filled 2019-02-26: qty 1

## 2019-02-26 MED ORDER — SODIUM CHLORIDE 0.9% FLUSH
3.0000 mL | Freq: Once | INTRAVENOUS | Status: AC
  Administered 2019-02-26: 3 mL via INTRAVENOUS

## 2019-02-26 MED ORDER — ASPIRIN 81 MG PO CHEW: 324.0000 mg | CHEWABLE_TABLET | Freq: Once | ORAL | Status: AC

## 2019-02-26 MED ORDER — ONDANSETRON HCL 4 MG/2ML IJ SOLN
4.0000 mg | Freq: Once | INTRAMUSCULAR | Status: AC
  Administered 2019-02-26: 4 mg via INTRAVENOUS
  Filled 2019-02-26: qty 2

## 2019-02-26 NOTE — Patient Instructions (Addendum)
Please stop at the lab.  Please continue metoprolol 100 mg daily.  Please return in 3-4 months.   Hyperthyroidism  Hyperthyroidism is when the thyroid gland is too active (overactive). The thyroid gland is a small gland located in the lower front part of the neck, just in front of the windpipe (trachea). This gland makes hormones that help control how the body uses food for energy (metabolism) as well as how the heart and brain function. These hormones also play a role in keeping your bones strong. When the thyroid is overactive, it produces too much of a hormone called thyroxine. What are the causes? This condition may be caused by:  Graves' disease. This is a disorder in which the body's disease-fighting system (immune system) attacks the thyroid gland. This is the most common cause.  Inflammation of the thyroid gland.  A tumor in the thyroid gland.  Use of certain medicines, including: ? Prescription thyroid hormone replacement. ? Herbal supplements that mimic thyroid hormones. ? Amiodarone therapy.  Solid or fluid-filled lumps within your thyroid gland (thyroid nodules).  Taking in a large amount of iodine from foods or medicines. What increases the risk? You are more likely to develop this condition if:  You are female.  You have a family history of thyroid conditions.  You smoke tobacco.  You use a medicine called lithium.  You take medicines that affect the immune system (immunosuppressants). What are the signs or symptoms? Symptoms of this condition include:  Nervousness.  Inability to tolerate heat.  Unexplained weight loss.  Diarrhea.  Change in the texture of hair or skin.  Heart skipping beats or making extra beats.  Rapid heart rate.  Loss of menstruation.  Shaky hands.  Fatigue.  Restlessness.  Sleep problems.  Enlarged thyroid gland or a lump in the thyroid (nodule). You may also have symptoms of Graves' disease, which may include:   Protruding eyes.  Dry eyes.  Red or swollen eyes.  Problems with vision. How is this diagnosed? This condition may be diagnosed based on:  Your symptoms and medical history.  A physical exam.  Blood tests.  Thyroid ultrasound. This test involves using sound waves to produce images of the thyroid gland.  A thyroid scan. A radioactive substance is injected into a vein, and images show how much iodine is present in the thyroid.  Radioactive iodine uptake test (RAIU). A small amount of radioactive iodine is given by mouth to see how much iodine the thyroid absorbs after a certain amount of time. How is this treated? Treatment depends on the cause and severity of the condition. Treatment may include:  Medicines to reduce the amount of thyroid hormone your body makes.  Radioactive iodine treatment (radioiodine therapy). This involves swallowing a small dose of radioactive iodine, in capsule or liquid form, to kill thyroid cells.  Surgery to remove part or all of your thyroid gland. You may need to take thyroid hormone replacement medicine for the rest of your life after thyroid surgery.  Medicines to help manage your symptoms. Follow these instructions at home:   Take over-the-counter and prescription medicines only as told by your health care provider.  Do not use any products that contain nicotine or tobacco, such as cigarettes and e-cigarettes. If you need help quitting, ask your health care provider.  Follow any instructions from your health care provider about diet. You may be instructed to limit foods that contain iodine.  Keep all follow-up visits as told by your health care  provider. This is important. ? You will need to have blood tests regularly so that your health care provider can monitor your condition. Contact a health care provider if:  Your symptoms do not get better with treatment.  You have a fever.  You are taking thyroid hormone replacement medicine and  you: ? Have symptoms of depression. ? Feel like you are tired all the time. ? Gain weight. Get help right away if:  You have chest pain.  You have decreased alertness or a change in your awareness.  You have abdominal pain.  You feel dizzy.  You have a rapid heartbeat.  You have an irregular heartbeat.  You have difficulty breathing. Summary  The thyroid gland is a small gland located in the lower front part of the neck, just in front of the windpipe (trachea).  Hyperthyroidism is when the thyroid gland is too active (overactive) and produces too much of a hormone called thyroxine.  The most common cause is Graves' disease, a disorder in which your immune system attacks the thyroid gland.  Hyperthyroidism can cause various symptoms, such as unexplained weight loss, nervousness, inability to tolerate heat, or changes in your heartbeat.  Treatment may include medicine to reduce the amount of thyroid hormone your body makes, radioiodine therapy, surgery, or medicines to manage symptoms. This information is not intended to replace advice given to you by your health care provider. Make sure you discuss any questions you have with your health care provider. Document Released: 05/31/2005 Document Revised: 05/13/2017 Document Reviewed: 05/11/2017 Elsevier Patient Education  2020 [employer].

## 2019-02-26 NOTE — Progress Notes (Signed)
Patient ID: Carla FoleySheila West, female   DOB: 1961/01/30, 58 y.o.   MRN: [id number]    HPI  Carla West is a 58 y.o.-year-old female, referred by her PCP, Carla SchoolsEdward Saquier, PA-C, for evaluation and management of thyrotoxicosis.  She had Covid19 in 08/2018 - fatigue, palpitation, diarrhea, 15 lbs weight loss. She continues to have diarrhea and fatigue.  Patient was found to have abnormal TFTs in 11/2017.  TSH remains suppressed but free thyroid hormones were normal until 01/2020 free T4 returned elevated.  She was referred to endocrinology.  Of note, she is on metoprolol 100 mg 1x a day.  I reviewed pt's thyroid tests: Lab Results  Component Value Date   TSH <0.01 (L) 01/23/2019   TSH <0.01 (L) 04/11/2018   TSH <0.01 (L) 02/28/2018   TSH <0.01 (L) 12/06/2017   TSH 0.54 08/06/2016   FREET4 4.77 (H) 01/24/2019   FREET4 1.07 04/11/2018   FREET4 1.17 02/28/2018   FREET4 1.46 12/06/2017   T3FREE 3.5 02/28/2018   Antithyroid antibodies: No results found for: TSI  She mentions: - + fatigue - + excessive sweating/heat intolerance- postmenopausal - + tremors - + anxiety - + palpitations-getting worse - + hyperdefecation - + weight loss- 15 pounds around the time of her COVID-19 diagnosis at the beginning of the year - + insomnia - + hair loss - + SOB with exertion  Pt denies: - feeling nodules in neck - hoarseness - dysphagia - choking - + SOB with lying down  Pt does have a FH of thyroid ds. In MGF. No FH of thyroid cancer. No h/o radiation tx to head or neck.  No seaweed or kelp, no recent contrast studies. No steroid use. No herbal supplements. No Biotin use.  Pt. also has a history of migraines.  ROS: Constitutional: + see HPI Eyes: no blurry vision, no xerophthalmia ENT: no sore throat, + see HPI Cardiovascular: no CP/+ SOB/+ palpitations/+.  Call leg swelling Respiratory: no cough/+ SOB Gastrointestinal: no N/V/+ D/no C Musculoskeletal: no muscle/joint  aches Skin: no rashes Neurological: + tremors/no numbness/tingling/dizziness Psychiatric: no depression/+ anxiety  Past Medical History:  Diagnosis Date  . Abnormal Pap smear of cervix 08/03/2015  . Benign essential HTN 08/03/2015  . History of chicken pox   . Migraine headache 08/03/2015   Past Surgical History:  Procedure Laterality Date  . TUBAL LIGATION     Social History   Socioeconomic History  . Marital status: Divorced    Spouse name: Not on file  . Number of children: Not on file  . Years of education: Not on file  . Highest education level: Not on file  Occupational History  . Not on file  Social Needs  . Financial resource strain: Not on file  . Food insecurity    Worry: Not on file    Inability: Not on file  . Transportation needs    Medical: Not on file    Non-medical: Not on file  Tobacco Use  . Smoking status: Never Smoker  . Smokeless tobacco: Never Used  Substance and Sexual Activity  . Alcohol use: No  . Drug use: No  . 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 Physical activity    Days per week: Not on file    Minutes per session: Not on file  . Stress: Not on file  Relationships  . Social Musicianconnections    Talks on phone: Not  on file    Gets together: Not on file    Attends religious service: Not on file    Active member of club or organization: Not on file    Attends meetings of clubs or organizations: Not on file    Relationship status: Not on file  . Intimate partner violence    Fear of current or ex partner: Not on file    Emotionally abused: Not on file    Physically abused: Not on file    Forced sexual activity: Not on file  Other Topics Concern  . Not on file  Social History Narrative  . Not on file   Current Outpatient Medications on File Prior to Visit  Medication Sig Dispense Refill  . albuterol (VENTOLIN HFA) 108 (90 Base) MCG/ACT inhaler  Inhale 2 puffs into the lungs every 6 (six) hours as needed for wheezing or shortness of breath. 18 g 0  . alendronate (FOSAMAX) 70 MG tablet Take 1 tablet (70 mg total) by mouth every 7 (seven) days. Take with a full glass of water on an empty stomach. 4 tablet 11  . almotriptan (AXERT) 12.5 MG tablet TAKE 1 TABLET BY MOUTH TWICE DAILY AS NEEDED FOR MIGRAINE. MAY REPEAT IN 2 HOURS IF NEEDED 10 tablet 0  . amLODipine (NORVASC) 10 MG tablet TAKE 1 TABLET BY MOUTH ONCE DAILY 30 tablet 5  . aspirin EC 81 MG tablet Take 1 tablet (81 mg total) by mouth daily. 90 tablet 3  . butalbital-acetaminophen-caffeine (FIORICET) 50-325-40 MG tablet TAKE 1 TABLET BY MOUTH EVERY 6 HOURS AS NEEDED FOR HEADACHE 30 tablet 0  . cyclobenzaprine (FLEXERIL) 5 MG tablet Take 0.5-2 tablets (2.5-10 mg total) by mouth at bedtime as needed for muscle spasms. 30 tablet 1  . HYDROcodone-acetaminophen (NORCO) 5-325 MG tablet Take 1 tablet by mouth every 6 (six) hours as needed for moderate pain. 16 tablet 0  . ibuprofen (ADVIL,MOTRIN) 800 MG tablet Take 1 tablet (800 mg total) by mouth every 8 (eight) hours as needed for headache or moderate pain. 21 tablet 0  . ondansetron (ZOFRAN ODT) 4 MG disintegrating tablet Take 1 tablet (4 mg total) by mouth every 8 (eight) hours as needed for nausea. 20 tablet 0  . promethazine (PHENERGAN) 25 MG tablet Take 1 tablet (25 mg total) by mouth every 8 (eight) hours as needed for nausea or vomiting. 20 tablet 1  . traZODone (DESYREL) 50 MG tablet TAKE 1/2 TO 1 TABLET BY MOUTH AT BEDTIME AS NEEDED FOR SLEEP 30 tablet 4   No current facility-administered medications on file prior to visit.    Allergies  Allergen Reactions  . Elavil [Amitriptyline Hcl]    Family History  Problem Relation Age of Onset  . Hypertension Mother   . Heart disease Mother   . Hyperlipidemia Father   . Breast cancer Maternal Grandmother   . Hyperlipidemia Sister   . Other Brother        plan crash in [employer]  .  Migraines Son   . Heart disease Brother        CHF, arrythmia  . Hypertension Brother   . Gout Brother   . Hypertension Brother     PE: BP 120/80 (BP Location: Left Arm, Patient Position: Sitting, Cuff Size: Normal)   Pulse 94   Ht 5\' 7"  (1.702 m)   Wt 139 lb (63 kg)   SpO2 98%   BMI 21.77 kg/m  Wt Readings from Last 3 Encounters:  02/26/19 139  lb (63 kg)  02/15/19 139 lb (63 kg)  02/01/19 140 lb (63.5 kg)   Constitutional: Normal weight, in NAD Eyes: PERRLA, EOMI, no exophthalmos, no lid lag, no stare ENT: moist mucous membranes, + symmetric thyromegaly, no thyroid bruits, no cervical lymphadenopathy Cardiovascular: No tachycardia at the time of the exam, RR, No MRG, + slight peri-ankle edema bilaterally Respiratory: CTA B Gastrointestinal: abdomen soft, NT, ND, BS+ Musculoskeletal: no deformities, strength intact in all 4 Skin: moist, warm, no rashes Neurological: + Tremor with outstretched hands, DTR normal in all 4  ASSESSMENT: 1. Thyrotoxicosis  PLAN:  1. Patient with a history of undetectable TSH since 11/2017, with recently increased free T4, also with thyrotoxic sxs: weight loss, heat intolerance, hyperdefecation, tremors, palpitations, anxiety.  - she does not appear to have exogenous causes for the low TSH.  - We discussed that possible causes of thyrotoxicosis are:  Marland Kitchen Graves ds (most likely diagnosis) -I explained that this is an autoimmune disease in which autoantibodies can overstimulate her thyroid . Thyroiditis . toxic multinodular goiter/ toxic adenoma (I cannot feel nodules at palpation of her thyroid). - will check the TSH, fT3 and fT4 and also add thyroid stimulating antibodies to screen for Graves' disease.  - If the tests remain abnormal, we may need an uptake and scan to differentiate between the 3 above possible etiologies  - we discussed about possible modalities of treatment for the above conditions, to include methimazole use, radioactive iodine  ablation or (last resort) surgery.  We also discussed about possible side effects from methimazole and when to get in touch with me if we end up starting this medication. - we may need to do thyroid ultrasound depending on the results of the uptake and scan (if a cold nodule is present) - She has worsening palpitations.  Continues on metoprolol.  She has a long history of hypertension and is taking metoprolol and Norvasc. - No signs of Graves' ophthalmopathy; she does not have any double vision, blurry vision, eye pain, chemosis. - RTC in 3-4 months, but likely sooner for repeat labs  Component     Latest Ref Rng & Units 02/26/2019 02/27/2019  TSH     0.350 - 4.500 uIU/mL <0.01 (L) <0.010 (L)  T4,Free(Direct)     0.61 - 1.12 ng/dL 7.61 (H) 4.70 (H)  Triiodothyronine,Free,Serum     2.3 - 4.2 pg/mL 18.8 (H)   TSI     <140 % baseline 467 (H)    TFTs consistent with significant thyrotoxicosis.  TSI's are elevated, indicating Graves' disease. Of note, patient was admitted to the hospital in the afternoon after our appointment with NSTEMI.  She had catheterization.  I discussed with the admitting physician and we started patient on methimazole 5 mg twice a day.  In the light of the above tests, I will actually increase her methimazole dose to 10 mg twice a day and repeat her tests in 4 weeks.  [name], MD PhD [facility] Endocrinology

## 2019-02-26 NOTE — ED Triage Notes (Addendum)
Per EMS, pt from home w/ Left sided CP that started an hour ago, has been "on and off" for three days.  Hurts when she moves, takes a deep breath, has SOB.  Pt was positive for COVID in March, tests weekly (work) negative last week.  Given 324 ASA inroute.  18G in R wrist.  States it goes to the left arm and to the back.

## 2019-02-26 NOTE — ED Provider Notes (Signed)
[facility]ENCY [facility] Provider Note   CSN: [id number] Arrival date & time: 02/26/19  2120     History   Chief Complaint Chief Complaint  Patient presents with  . Chest Pain    HPI Carla West is a 58 y.o. female.     HPI   Carla West is a 58 y.o. female, with a history of HTN, presenting to the ED with chest pain beginning around 8 PM tonight.  Patient states she was at rest in bed.  Pain is central and left chest, "feels like an elephant sitting on the chest," radiating to the left arm and back, currently 8/10, constant. Accompanied by nausea.  Diarrhea for the past 2 days. States she has been experiencing intermittent palpitations for the past week and shortness of breath with exertion.  She had an episode of similar feeling chest pain last night, but less intense, lasted for 30 minutes before complete resolution. She has her temperature taken daily at work and these have been normal.  She has weekly test for COVID-19, the last of which was taken late last week, results are still pending, results were negative from the previous week. She notes she was infected with COVID-31 August 2018 and has had some intermittent shortness of breath since then.  She has been experiencing some mild bilateral ankle edema beginning last week. Patient received 324 mg ASA via EMS. Denies fever/chills, cough, vomiting, abdominal pain, syncope, diaphoresis, or any other complaints.    Past Medical History:  Diagnosis Date  . Abnormal Pap smear of cervix 08/03/2015  . Benign essential HTN 08/03/2015  . History of chicken pox   . Migraine headache 08/03/2015    Patient Active Problem List   Diagnosis Date Noted  . NSTEMI (non-ST elevated myocardial infarction) (HCC) 02/27/2019  . Hypokalemia 02/27/2019  . 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 Atypical chest pain 01/29/2019  . Palpitations 04/11/2018  . Pain in right toe(s) 04/11/2018  .  Abnormal TSH 12/28/2017  . Hyperlipidemia 12/28/2017  . Preventative health care 12/07/2017  . Essential hypertension 08/03/2015  . Migraine headache 08/03/2015  . Abnormal Pap smear of cervix 08/03/2015  . History of chicken pox     Past Surgical History:  Procedure Laterality Date  . TUBAL LIGATION       OB History   No obstetric history on file.      Home Medications    Prior to Admission medications   Medication Sig Start Date End Date Taking? Authorizing Provider  amLODipine (NORVASC) 10 MG tablet TAKE 1 TABLET BY MOUTH ONCE DAILY Patient taking differently: Take 10 mg by mouth daily.  01/19/19  Yes Bradd CanaryBlyth, Stacey A, MD  aspirin EC 81 MG tablet Take 1 tablet (81 mg total) by mouth daily. 01/29/19  Yes Georgeanna LeaKrasowski, Robert J, MD  metoprolol tartrate (LOPRESSOR) 100 MG tablet Take 1 tablet (100 mg total) by mouth daily. 02/26/19  Yes Carlus PavlovGherghe, Cristina, MD  albuterol (VENTOLIN HFA) 108 (90 Base) MCG/ACT inhaler Inhale 2 puffs into the lungs every 6 (six) hours as needed for wheezing or shortness of breath. Patient not taking: Reported on 02/27/2019 12/12/18   Saguier, Ramon DredgeEdward, PA-C  alendronate (FOSAMAX) 70 MG tablet Take 1 tablet (70 mg total) by mouth every 7 (seven) days. Take with a full glass of water on an empty stomach. Patient not taking: Reported on 02/27/2019 04/11/18   Bradd CanaryBlyth, Stacey A, MD  almotriptan (AXERT) 12.5 MG tablet TAKE 1 TABLET BY MOUTH TWICE  DAILY AS NEEDED FOR MIGRAINE. MAY REPEAT IN 2 HOURS IF NEEDED Patient not taking: Reported on 02/27/2019 09/20/18   Bradd Canary, MD  butalbital-acetaminophen-caffeine (FIORICET) 50-325-40 MG tablet TAKE 1 TABLET BY MOUTH EVERY 6 HOURS AS NEEDED FOR HEADACHE Patient not taking: TAKE 1 TABLET BY MOUTH EVERY 6 HOURS AS NEEDED FOR HEADACHE 10/29/18   Bradd Canary, MD  cyclobenzaprine (FLEXERIL) 5 MG tablet Take 0.5-2 tablets (2.5-10 mg total) by mouth at bedtime as needed for muscle spasms. Patient not taking: Reported on 02/27/2019  04/11/18   Bradd Canary, MD  HYDROcodone-acetaminophen [employer] [facility]) 5-325 MG tablet Take 1 tablet by mouth every 6 (six) hours as needed for moderate pain. Patient not taking: Reported on 02/27/2019 10/27/18   Saguier, Ramon Dredge, PA-C  ibuprofen (ADVIL,MOTRIN) 800 MG tablet Take 1 tablet (800 mg total) by mouth every 8 (eight) hours as needed for headache or moderate pain. Patient not taking: Reported on 02/27/2019 06/12/18   Long, Arlyss Repress, MD  ondansetron (ZOFRAN ODT) 4 MG disintegrating tablet Take 1 tablet (4 mg total) by mouth every 8 (eight) hours as needed for nausea. Patient not taking: Reported on 02/27/2019 06/12/18   Long, Arlyss Repress, MD  promethazine (PHENERGAN) 25 MG tablet Take 1 tablet (25 mg total) by mouth every 8 (eight) hours as needed for nausea or vomiting. Patient not taking: Reported on 02/27/2019 06/16/18   Bradd Canary, MD  traZODone (DESYREL) 50 MG tablet TAKE 1/2 TO 1 TABLET BY MOUTH AT BEDTIME AS NEEDED FOR SLEEP Patient not taking: Reported on 02/27/2019 12/14/18   Bradd Canary, MD    Family History Family History  Problem Relation Age of Onset  . Hypertension Mother   . Heart disease Mother   . Hyperlipidemia Father   . Breast cancer Maternal Grandmother   . Hyperlipidemia Sister   . Other Brother        plan crash in [employer]  . Migraines Son   . Heart disease Brother        CHF, arrythmia  . Hypertension Brother   . Gout Brother   . Hypertension Brother     Social History Social History   Tobacco Use  . Smoking status: Never Smoker  . Smokeless tobacco: Never Used  Substance Use Topics  . Alcohol use: No  . Drug use: No     Allergies   Elavil [amitriptyline hcl]   Review of Systems Review of Systems  Constitutional: Negative for chills and fever.  Respiratory: Positive for shortness of breath. Negative for cough.   Cardiovascular: Positive for chest pain, palpitations and leg swelling.  Gastrointestinal: Positive for diarrhea and nausea.  Negative for abdominal pain, blood in stool and vomiting.  Musculoskeletal: Negative for neck pain.  Neurological: Negative for syncope and weakness.  All other systems reviewed and are negative.    Physical Exam Updated Vital Signs BP (!) 152/85 (BP Location: Right Arm)   Pulse (!) 112   Temp 98 F (36.7 C) (Oral)   Resp 16   Ht 5\' 7"  (1.702 m)   Wt 63.5 kg   SpO2 100%   BMI 21.93 kg/m   Physical Exam Vitals signs and nursing note reviewed.  Constitutional:      General: She is in acute distress.     Appearance: She is well-developed. She is not diaphoretic.  HENT:     Head: Normocephalic and atraumatic.     Mouth/Throat:     Mouth: Mucous membranes are moist.  Pharynx: Oropharynx is clear.  Eyes:     Conjunctiva/sclera: Conjunctivae normal.  Neck:     Musculoskeletal: Neck supple.  Cardiovascular:     Rate and Rhythm: Regular rhythm. Tachycardia present.     Pulses: Normal pulses.          Radial pulses are 2+ on the right side and 2+ on the left side.       Posterior tibial pulses are 2+ on the right side and 2+ on the left side.     Heart sounds: Normal heart sounds.     Comments: Tactile temperature in the extremities appropriate and equal bilaterally. Pulmonary:     Effort: Pulmonary effort is normal. Tachypnea present.     Breath sounds: Normal breath sounds.     Comments: Increased work of breathing Abdominal:     Palpations: Abdomen is soft.     Tenderness: There is no abdominal tenderness. There is no guarding.  Musculoskeletal:     Right lower leg: No edema.     Left lower leg: No edema.  Lymphadenopathy:     Cervical: No cervical adenopathy.  Skin:    General: Skin is warm and dry.  Neurological:     Mental Status: She is alert.     Comments: Sensation grossly intact to light touch in the extremities.  Grip strengths equal bilaterally.  Strength 5/5 in all extremities. No gait disturbance. Coordination intact. Cranial nerves III-XII grossly  intact. No facial droop.   Psychiatric:        Mood and Affect: Mood and affect normal.        Speech: Speech normal.        Behavior: Behavior normal.      ED Treatments / Results  Labs (all labs ordered are listed, but only abnormal results are displayed) Labs Reviewed  BASIC METABOLIC PANEL - Abnormal; Notable for the following components:      Result Value   Potassium 3.0 (*)    CO2 20 (*)    Glucose, Bld 109 (*)    All other components within normal limits  CBC - Abnormal; Notable for the following components:   RBC 3.86 (*)    Hemoglobin 10.3 (*)    HCT 31.9 (*)    All other components within normal limits  TROPONIN I (HIGH SENSITIVITY) - Abnormal; Notable for the following components:   Troponin I (High Sensitivity) 29 (*)    All other components within normal limits  TROPONIN I (HIGH SENSITIVITY) - Abnormal; Notable for the following components:   Troponin I (High Sensitivity) 619 (*)    All other components within normal limits  SARS CORONAVIRUS 2 (TAT 6-24 HRS)  D-DIMER, QUANTITATIVE (NOT AT [facility])  HEPARIN LEVEL (UNFRACTIONATED)    EKG EKG Interpretation  Date/Time:  Monday February 26 2019 21:35:55 EDT Ventricular Rate:  107 PR Interval:  226 QRS Duration: 80 QT Interval:  328 QTC Calculation: 437 R Axis:   56 Text Interpretation:  Sinus tachycardia with 1st degree A-V block ST & T wave abnormality, consider inferior ischemia Abnormal ECG ST changes inferiorly and laterally Confirmed by Ross MarcusHorton, Courtney ([id number]) on 02/26/2019 11:05:41 PM   EKG Interpretation  Date/Time:  Tuesday February 27 2019 00:06:20 EDT Ventricular Rate:  109 PR Interval:  226 QRS Duration: 78 QT Interval:  300 QTC Calculation: 404 R Axis:   78 Text Interpretation:  Sinus tachycardia Prolonged PR interval LVH with secondary repolarization abnormality No significant change since last tracing Confirmed by Ross MarcusHorton, Courtney (  [id number]) on 02/27/2019 2:13:30 AM         Radiology  Dg Chest Portable 1 View  Result Date: 02/26/2019 CLINICAL DATA:  Chest pain on the left EXAM: PORTABLE CHEST 1 VIEW COMPARISON:  02/15/2019 FINDINGS: The heart size and mediastinal contours are within normal limits. Both lungs are clear. The visualized skeletal structures are unremarkable. IMPRESSION: No active disease. Electronically Signed   By: Inez Catalina M.D.   On: 02/26/2019 23:14    Procedures .Critical Care Performed by: Lorayne Bender, PA-C Authorized by: Lorayne Bender, PA-C   Critical care provider statement:    Critical care time (minutes):  35   Critical care time was exclusive of:  Separately billable procedures and treating other patients   Critical care was necessary to treat or prevent imminent or life-threatening deterioration of the following conditions:  Circulatory failure (NSTEMI)   Critical care was time spent personally by me on the following activities:  Development of treatment plan with patient or surrogate, discussions with consultants, ordering and performing treatments and interventions, ordering and review of laboratory studies, ordering and review of radiographic studies, pulse oximetry, re-evaluation of patient's condition, examination of patient, evaluation of patient's response to treatment and obtaining history from patient or surrogate   I assumed direction of critical care for this patient from another provider in my specialty: no     (including critical care time)  Medications Ordered in ED Medications  heparin ADULT infusion 100 units/mL (25000 units/276mL sodium chloride 0.45%) (850 Units/hr Intravenous New Bag/Given 02/27/19 0122)  potassium chloride SA (K-DUR) CR tablet 60 mEq (has no administration in time range)  0.9 %  sodium chloride infusion (has no administration in time range)  sodium chloride flush (NS) 0.9 % injection 3 mL (3 mLs Intravenous Given 02/26/19 2345)  nitroGLYCERIN (NITROGLYN) 2 % ointment 1 inch (1 inch Topical Given 02/26/19 2336)   aspirin chewable tablet 324 mg (324 mg Oral Given by EMS 02/26/19 2346)  ondansetron (ZOFRAN) injection 4 mg (4 mg Intravenous Given 02/26/19 2340)  morphine 4 MG/ML injection 4 mg (4 mg Intravenous Given 02/26/19 2340)  heparin bolus via infusion 4,000 Units (4,000 Units Intravenous Bolus from Bag 02/27/19 0124)  sodium chloride 0.9 % bolus 500 mL (500 mLs Intravenous New Bag/Given 02/27/19 0125)     Initial Impression / Assessment and Plan / ED Course  I have reviewed the triage vital signs and the nursing notes.  Pertinent labs & imaging results that were available during my care of the patient were reviewed by me and considered in my medical decision making (see chart for details).  Clinical Course as of Feb 27 239  Mon Feb 26, 2019  2252 Patient contact. Asked RN to put patient in triage room due to troponin elevation and EKG changes.    [SJ]  Tue Feb 27, 2019  0034 Patient is pain-free. She states she feels better overall. Pulse rate of 106 and regular.  Patient received the morphine and the nitroglycerin paste simultaneously so she was unable to discern which one helped her more.   [SJ]  0114 Spoke with Dr. Kalman Shan, cardiology fellow.  Due to patient's hyperthyroidism, requests admission via medicine service.   [SJ]  8299 Spoke with Dr. Blaine Hamper, hospitalist. Agrees to admit the patient.   [SJ]    Clinical Course User Index [SJ] Joy, Shawn C, PA-C       Patient presents with chest pain and shortness of breath.  Onset of pain and description  gives suspicion for ACS.  ST depression and mildly elevated initial troponin support this.  PE was also on the differential, but d-dimer negative. Second troponin significantly higher at 619, solidifies diagnosis of NSTEMI. Cardiology consulted and patient admitted via medicine service.  Patient is receiving care for hyperthyroidism through endocrinology, Dr. Elvera Lennox.  Endocrinology emergencies were considered, such as thyroid storm.  I suspect  this is less likely due to the improvement in the patient's hypertension (also not impressively elevated to begin with), low level tachycardia, and lack of mental status change. Despite the above, hyperthyroidism would explain the patient's mild tachycardia as well as intermittent palpitations.   Findings and plan of care discussed with Ross Marcus, MD. Dr. Wilkie Aye personally evaluated and examined this patient.  Vitals:   02/27/19 0015 02/27/19 0100 02/27/19 0115 02/27/19 0117  BP: (!) 142/86 130/81 137/85 137/85  Pulse: (!) 109 (!) 111 (!) 114 (!) 109  Resp: 16 (!) 24 20 (!) 24  Temp:    99.1 F (37.3 C)  TempSrc:    Oral  SpO2: 98% 99% 99% 99%  Weight:      Height:         Final Clinical Impressions(s) / ED Diagnoses   Final diagnoses:  NSTEMI (non-ST elevated myocardial infarction) [facility])  Hyperthyroidism    ED Discharge Orders    None       Concepcion Living 02/27/19 0240    Shon Baton, MD 02/27/19 [phone]

## 2019-02-27 ENCOUNTER — Encounter (HOSPITAL_COMMUNITY): Payer: Self-pay | Admitting: Emergency Medicine

## 2019-02-27 ENCOUNTER — Inpatient Hospital Stay (HOSPITAL_COMMUNITY): Payer: Managed Care, Other (non HMO)

## 2019-02-27 ENCOUNTER — Encounter (HOSPITAL_COMMUNITY)
Admission: EM | Disposition: A | Discharge status: Home / Self Care | Payer: Self-pay | Source: Home / Self Care | Attending: Internal Medicine

## 2019-02-27 ENCOUNTER — Telehealth (HOSPITAL_COMMUNITY): Payer: Self-pay

## 2019-02-27 DIAGNOSIS — E876 Hypokalemia: Secondary | ICD-10-CM | POA: Diagnosis present

## 2019-02-27 DIAGNOSIS — Z79899 Other long term (current) drug therapy: Secondary | ICD-10-CM | POA: Diagnosis not present

## 2019-02-27 DIAGNOSIS — I214 Non-ST elevation (NSTEMI) myocardial infarction: Secondary | ICD-10-CM | POA: Diagnosis present

## 2019-02-27 DIAGNOSIS — Z79891 Long term (current) use of opiate analgesic: Secondary | ICD-10-CM | POA: Diagnosis not present

## 2019-02-27 DIAGNOSIS — G43909 Migraine, unspecified, not intractable, without status migrainosus: Secondary | ICD-10-CM | POA: Diagnosis present

## 2019-02-27 DIAGNOSIS — I2510 Atherosclerotic heart disease of native coronary artery without angina pectoris: Secondary | ICD-10-CM

## 2019-02-27 DIAGNOSIS — G43809 Other migraine, not intractable, without status migrainosus: Secondary | ICD-10-CM

## 2019-02-27 DIAGNOSIS — E785 Hyperlipidemia, unspecified: Secondary | ICD-10-CM | POA: Diagnosis present

## 2019-02-27 DIAGNOSIS — Z7983 Long term (current) use of bisphosphonates: Secondary | ICD-10-CM | POA: Diagnosis not present

## 2019-02-27 DIAGNOSIS — Z8249 Family history of ischemic heart disease and other diseases of the circulatory system: Secondary | ICD-10-CM | POA: Diagnosis not present

## 2019-02-27 DIAGNOSIS — E0590 Thyrotoxicosis, unspecified without thyrotoxic crisis or storm: Secondary | ICD-10-CM | POA: Diagnosis present

## 2019-02-27 DIAGNOSIS — Z8349 Family history of other endocrine, nutritional and metabolic diseases: Secondary | ICD-10-CM | POA: Diagnosis not present

## 2019-02-27 DIAGNOSIS — I10 Essential (primary) hypertension: Secondary | ICD-10-CM

## 2019-02-27 DIAGNOSIS — Z8619 Personal history of other infectious and parasitic diseases: Secondary | ICD-10-CM | POA: Diagnosis not present

## 2019-02-27 DIAGNOSIS — F419 Anxiety disorder, unspecified: Secondary | ICD-10-CM | POA: Diagnosis present

## 2019-02-27 DIAGNOSIS — Z888 Allergy status to other drugs, medicaments and biological substances status: Secondary | ICD-10-CM | POA: Diagnosis not present

## 2019-02-27 DIAGNOSIS — Z7982 Long term (current) use of aspirin: Secondary | ICD-10-CM | POA: Diagnosis not present

## 2019-02-27 HISTORY — PX: CORONARY STENT INTERVENTION: CATH118234

## 2019-02-27 HISTORY — PX: LEFT HEART CATH AND CORONARY ANGIOGRAPHY: CATH118249

## 2019-02-27 LAB — BASIC METABOLIC PANEL
Anion gap: 11 (ref 5–15)
BUN: 12 mg/dL (ref 6–20)
CO2: 21 mmol/L — ABNORMAL LOW (ref 22–32)
Calcium: 9.3 mg/dL (ref 8.9–10.3)
Chloride: 108 mmol/L (ref 98–111)
Creatinine, Ser: 0.56 mg/dL (ref 0.44–1.00)
GFR calc Af Amer: >60 mL/min (ref >60)
GFR calc non Af Amer: >60 mL/min (ref >60)
Glucose, Bld: 76 mg/dL (ref 70–99)
Potassium: 4.4 mmol/L (ref 3.5–5.1)
Sodium: 140 mmol/L (ref 135–145)

## 2019-02-27 LAB — TROPONIN I (HIGH SENSITIVITY)
Troponin I (High Sensitivity): 2569 ng/L (ref <18)
Troponin I (High Sensitivity): 3340 ng/L (ref <18)
Troponin I (High Sensitivity): 619 ng/L (ref <18)

## 2019-02-27 LAB — LIPID PANEL
Cholesterol: 153 mg/dL (ref 0–200)
HDL: 64 mg/dL (ref >40)
LDL Cholesterol: 80 mg/dL (ref 0–99)
Total CHOL/HDL Ratio: 2.4 RATIO
Triglycerides: 47 mg/dL (ref <150)
VLDL: 9 mg/dL (ref 0–40)

## 2019-02-27 LAB — ECHOCARDIOGRAM LIMITED
Height: 67 in
Weight: 2240 oz

## 2019-02-27 LAB — HEPARIN LEVEL (UNFRACTIONATED): Heparin Unfractionated: 0.51 IU/mL (ref 0.30–0.70)

## 2019-02-27 LAB — T4, FREE
Free T4: 4.84 ng/dL — ABNORMAL HIGH (ref 0.60–1.60)
Free T4: 5.34 ng/dL — ABNORMAL HIGH (ref 0.61–1.12)

## 2019-02-27 LAB — TSH
TSH: <0.01 u[IU]/mL — ABNORMAL LOW (ref 0.35–4.50)
TSH: <0.01 u[IU]/mL — ABNORMAL LOW (ref 0.350–4.500)

## 2019-02-27 LAB — SARS CORONAVIRUS 2 (TAT 6-24 HRS): SARS Coronavirus 2: NEGATIVE

## 2019-02-27 LAB — BRAIN NATRIURETIC PEPTIDE: B Natriuretic Peptide: 624.9 pg/mL — ABNORMAL HIGH (ref 0.0–100.0)

## 2019-02-27 LAB — MAGNESIUM: Magnesium: 1.7 mg/dL (ref 1.7–2.4)

## 2019-02-27 LAB — T3, FREE: T3, Free: 18.8 pg/mL — ABNORMAL HIGH (ref 2.3–4.2)

## 2019-02-27 LAB — POCT ACTIVATED CLOTTING TIME
Activated Clotting Time: 279 seconds
Activated Clotting Time: 285 seconds
Activated Clotting Time: >1000 seconds

## 2019-02-27 LAB — D-DIMER, QUANTITATIVE: D-Dimer, Quant: 0.31 ug/mL-FEU (ref 0.00–0.50)

## 2019-02-27 SURGERY — LEFT HEART CATH AND CORONARY ANGIOGRAPHY
Anesthesia: LOCAL

## 2019-02-27 MED ORDER — IOHEXOL 350 MG/ML SOLN
INTRAVENOUS | Status: DC | PRN
  Administered 2019-02-27: 150 mL via INTRA_ARTERIAL

## 2019-02-27 MED ORDER — SODIUM CHLORIDE 0.9% FLUSH
3.0000 mL | Freq: Two times a day (BID) | INTRAVENOUS | Status: DC
  Administered 2019-02-27 – 2019-02-28 (×2): 3 mL via INTRAVENOUS

## 2019-02-27 MED ORDER — SODIUM CHLORIDE 0.9 % WEIGHT BASED INFUSION
3.0000 mL/kg/h | INTRAVENOUS | Status: DC
  Administered 2019-02-27: 3 mL/kg/h via INTRAVENOUS

## 2019-02-27 MED ORDER — ALBUTEROL SULFATE (2.5 MG/3ML) 0.083% IN NEBU: 2.5000 mg | INHALATION_SOLUTION | Freq: Four times a day (QID) | RESPIRATORY_TRACT | Status: DC | PRN

## 2019-02-27 MED ORDER — VERAPAMIL HCL 2.5 MG/ML IV SOLN
INTRAVENOUS | Status: AC
  Filled 2019-02-27: qty 2

## 2019-02-27 MED ORDER — TICAGRELOR 90 MG PO TABS
ORAL_TABLET | ORAL | Status: AC
  Filled 2019-02-27: qty 1

## 2019-02-27 MED ORDER — HEPARIN SODIUM (PORCINE) 1000 UNIT/ML IJ SOLN
INTRAMUSCULAR | Status: AC
  Filled 2019-02-27: qty 1

## 2019-02-27 MED ORDER — NITROGLYCERIN 2 % TD OINT
1.0000 [in_us] | TOPICAL_OINTMENT | Freq: Once | TRANSDERMAL | Status: DC
  Filled 2019-02-27: qty 30

## 2019-02-27 MED ORDER — HYDRALAZINE HCL 20 MG/ML IJ SOLN: 10.0000 mg | INTRAMUSCULAR | Status: AC | PRN

## 2019-02-27 MED ORDER — HEPARIN BOLUS VIA INFUSION
4000.0000 [IU] | Freq: Once | INTRAVENOUS | Status: AC
  Administered 2019-02-27: 4000 [IU] via INTRAVENOUS
  Filled 2019-02-27: qty 4000

## 2019-02-27 MED ORDER — POTASSIUM CHLORIDE CRYS ER 20 MEQ PO TBCR
60.0000 meq | EXTENDED_RELEASE_TABLET | Freq: Once | ORAL | Status: AC
  Administered 2019-02-27: 60 meq via ORAL
  Filled 2019-02-27: qty 3

## 2019-02-27 MED ORDER — NITROGLYCERIN 0.4 MG SL SUBL: 0.4000 mg | SUBLINGUAL_TABLET | SUBLINGUAL | Status: DC | PRN

## 2019-02-27 MED ORDER — TICAGRELOR 90 MG PO TABS
ORAL_TABLET | ORAL | Status: DC | PRN
  Administered 2019-02-27: 180 mg via ORAL

## 2019-02-27 MED ORDER — SODIUM CHLORIDE 0.9% FLUSH: 3.0000 mL | INTRAVENOUS | Status: DC | PRN

## 2019-02-27 MED ORDER — ACETAMINOPHEN 325 MG PO TABS
650.0000 mg | ORAL_TABLET | ORAL | Status: DC | PRN
  Administered 2019-02-27: 650 mg via ORAL
  Filled 2019-02-27: qty 2

## 2019-02-27 MED ORDER — TICAGRELOR 90 MG PO TABS
90.0000 mg | ORAL_TABLET | Freq: Two times a day (BID) | ORAL | Status: DC
  Administered 2019-02-28 (×2): 90 mg via ORAL
  Filled 2019-02-27 (×2): qty 1

## 2019-02-27 MED ORDER — MIDAZOLAM HCL 2 MG/2ML IJ SOLN
INTRAMUSCULAR | Status: DC | PRN
  Administered 2019-02-27 (×2): 1 mg via INTRAVENOUS

## 2019-02-27 MED ORDER — LIDOCAINE HCL (PF) 1 % IJ SOLN
INTRAMUSCULAR | Status: DC | PRN
  Administered 2019-02-27: 2 mL

## 2019-02-27 MED ORDER — SODIUM CHLORIDE 0.9% FLUSH: 3.0000 mL | Freq: Two times a day (BID) | INTRAVENOUS | Status: DC

## 2019-02-27 MED ORDER — HEPARIN (PORCINE) IN NACL 1000-0.9 UT/500ML-% IV SOLN
INTRAVENOUS | Status: DC | PRN
  Administered 2019-02-27 (×2): 500 mL

## 2019-02-27 MED ORDER — ONDANSETRON HCL 4 MG/2ML IJ SOLN
4.0000 mg | Freq: Four times a day (QID) | INTRAMUSCULAR | Status: DC | PRN
  Administered 2019-02-27 (×2): 4 mg via INTRAVENOUS
  Filled 2019-02-27 (×2): qty 2

## 2019-02-27 MED ORDER — METOPROLOL TARTRATE 100 MG PO TABS
100.0000 mg | ORAL_TABLET | Freq: Every day | ORAL | Status: DC
  Administered 2019-02-27 – 2019-02-28 (×2): 100 mg via ORAL
  Filled 2019-02-27: qty 1
  Filled 2019-02-27: qty 4

## 2019-02-27 MED ORDER — HYDRALAZINE HCL 20 MG/ML IJ SOLN: 5.0000 mg | INTRAMUSCULAR | Status: DC | PRN

## 2019-02-27 MED ORDER — METHIMAZOLE 5 MG PO TABS
5.0000 mg | ORAL_TABLET | Freq: Two times a day (BID) | ORAL | Status: DC
  Administered 2019-02-27 – 2019-02-28 (×2): 5 mg via ORAL
  Filled 2019-02-27 (×4): qty 1

## 2019-02-27 MED ORDER — ASPIRIN EC 81 MG PO TBEC
81.0000 mg | DELAYED_RELEASE_TABLET | Freq: Every day | ORAL | Status: DC
  Administered 2019-02-27 – 2019-02-28 (×2): 81 mg via ORAL
  Filled 2019-02-27 (×2): qty 1

## 2019-02-27 MED ORDER — FENTANYL CITRATE (PF) 100 MCG/2ML IJ SOLN
INTRAMUSCULAR | Status: AC
  Filled 2019-02-27: qty 2

## 2019-02-27 MED ORDER — SODIUM CHLORIDE 0.9 % IV SOLN: INTRAVENOUS | Status: AC

## 2019-02-27 MED ORDER — SODIUM CHLORIDE 0.9 % IV SOLN
INTRAVENOUS | Status: DC
  Administered 2019-02-27: 03:00:00 via INTRAVENOUS

## 2019-02-27 MED ORDER — SODIUM CHLORIDE 0.9 % IV SOLN: 250.0000 mL | INTRAVENOUS | Status: DC | PRN

## 2019-02-27 MED ORDER — FENTANYL CITRATE (PF) 100 MCG/2ML IJ SOLN
INTRAMUSCULAR | Status: DC | PRN
  Administered 2019-02-27 (×2): 25 ug via INTRAVENOUS

## 2019-02-27 MED ORDER — SODIUM CHLORIDE 0.9 % IV BOLUS
500.0000 mL | Freq: Once | INTRAVENOUS | Status: AC
  Administered 2019-02-27: 500 mL via INTRAVENOUS

## 2019-02-27 MED ORDER — LIDOCAINE HCL (PF) 1 % IJ SOLN
INTRAMUSCULAR | Status: AC
  Filled 2019-02-27: qty 30

## 2019-02-27 MED ORDER — SODIUM CHLORIDE 0.9 % WEIGHT BASED INFUSION: 1.0000 mL/kg/h | INTRAVENOUS | Status: DC

## 2019-02-27 MED ORDER — MIDAZOLAM HCL 2 MG/2ML IJ SOLN
INTRAMUSCULAR | Status: AC
  Filled 2019-02-27: qty 2

## 2019-02-27 MED ORDER — HEPARIN (PORCINE) IN NACL 1000-0.9 UT/500ML-% IV SOLN
INTRAVENOUS | Status: AC
  Filled 2019-02-27: qty 1000

## 2019-02-27 MED ORDER — MORPHINE SULFATE (PF) 2 MG/ML IV SOLN: 2.0000 mg | INTRAVENOUS | Status: DC | PRN

## 2019-02-27 MED ORDER — ASPIRIN EC 81 MG PO TBEC: 81.0000 mg | DELAYED_RELEASE_TABLET | Freq: Every day | ORAL | Status: DC

## 2019-02-27 MED ORDER — ATORVASTATIN CALCIUM 80 MG PO TABS
80.0000 mg | ORAL_TABLET | Freq: Every day | ORAL | Status: DC
  Administered 2019-02-27: 80 mg via ORAL
  Filled 2019-02-27: qty 1

## 2019-02-27 MED ORDER — AMLODIPINE BESYLATE 10 MG PO TABS
10.0000 mg | ORAL_TABLET | Freq: Every day | ORAL | Status: DC
  Administered 2019-02-27 – 2019-02-28 (×2): 10 mg via ORAL
  Filled 2019-02-27: qty 2
  Filled 2019-02-27: qty 1

## 2019-02-27 MED ORDER — LABETALOL HCL 5 MG/ML IV SOLN: 10.0000 mg | INTRAVENOUS | Status: AC | PRN

## 2019-02-27 MED ORDER — HEPARIN SODIUM (PORCINE) 1000 UNIT/ML IJ SOLN
INTRAMUSCULAR | Status: DC | PRN
  Administered 2019-02-27: 7000 [IU] via INTRAVENOUS
  Administered 2019-02-27: 2000 [IU] via INTRAVENOUS
  Administered 2019-02-27: 3000 [IU] via INTRAVENOUS

## 2019-02-27 MED ORDER — VERAPAMIL HCL 2.5 MG/ML IV SOLN
INTRAVENOUS | Status: DC | PRN
  Administered 2019-02-27: 10 mL via INTRA_ARTERIAL

## 2019-02-27 MED ORDER — ASPIRIN 81 MG PO CHEW: 81.0000 mg | CHEWABLE_TABLET | ORAL | Status: DC

## 2019-02-27 MED ORDER — HEPARIN (PORCINE) 25000 UT/250ML-% IV SOLN
850.0000 [IU]/h | INTRAVENOUS | Status: DC
  Administered 2019-02-27: 850 [IU]/h via INTRAVENOUS
  Filled 2019-02-27: qty 250

## 2019-02-27 SURGICAL SUPPLY — 20 items
BALLN SAPPHIRE 2.5X12 (BALLOONS) ×2
BALLN SAPPHIRE ~~LOC~~ 3.75X8 (BALLOONS) ×1 IMPLANT
BALLN ~~LOC~~ EMERGE MR 3.75X6 (BALLOONS) ×2
BALLOON SAPPHIRE 2.5X12 (BALLOONS) IMPLANT
BALLOON ~~LOC~~ EMERGE MR 3.75X6 (BALLOONS) IMPLANT
CATH 5FR JL3.5 JR4 ANG PIG MP (CATHETERS) ×1 IMPLANT
CATH LAUNCHER 6FR JL3.5 (CATHETERS) ×1 IMPLANT
CATH VISTA GUIDE 6FR XBLAD3.5 (CATHETERS) ×1 IMPLANT
DEVICE RAD COMP TR BAND LRG (VASCULAR PRODUCTS) ×1 IMPLANT
GLIDESHEATH SLEND SS 6F .021 (SHEATH) ×1 IMPLANT
GUIDELINER 6F (CATHETERS) ×1 IMPLANT
GUIDEWIRE INQWIRE 1.5J.035X260 (WIRE) IMPLANT
INQWIRE 1.5J .035X260CM (WIRE) ×2
KIT ENCORE 26 ADVANTAGE (KITS) ×1 IMPLANT
KIT HEART LEFT (KITS) ×2 IMPLANT
PACK CARDIAC CATHETERIZATION (CUSTOM PROCEDURE TRAY) ×2 IMPLANT
STENT RESOLUTE ONYX 3.5X12 (Permanent Stent) ×1 IMPLANT
TRANSDUCER W/STOPCOCK (MISCELLANEOUS) ×2 IMPLANT
TUBING CIL FLEX 10 FLL-RA (TUBING) ×2 IMPLANT
WIRE COUGAR XT STRL 190CM (WIRE) ×1 IMPLANT

## 2019-02-27 NOTE — Interval H&P Note (Signed)
History and Physical Interval Note:  02/27/2019 3:21 PM  Carla West  has presented today for surgery, with the diagnosis of nonstemi.  The various methods of treatment have been discussed with the patient and family. After consideration of risks, benefits and other options for treatment, the patient has consented to  Procedure(s): LEFT HEART CATH AND CORONARY ANGIOGRAPHY (N/A) as a surgical intervention.  The patient's history has been reviewed, patient examined, no change in status, stable for surgery.  I have reviewed the patient's chart and labs.  Questions were answered to the patient's satisfaction.    Cath Lab Visit (complete for each Cath Lab visit)  Clinical Evaluation Leading to the Procedure:   ACS: No.  Non-ACS:    Anginal Classification: CCS III  Anti-ischemic medical therapy: Maximal Therapy (2 or more classes of medications)  Non-Invasive Test Results: No non-invasive testing performed  Prior CABG: No previous CABG         Lauree Chandler

## 2019-02-27 NOTE — ED Notes (Signed)
Dr Farris Has into see pt and clear up any questions that pt has

## 2019-02-27 NOTE — Progress Notes (Signed)
ANTICOAGULATION CONSULT NOTE - Initial Consult  Pharmacy Consult for Heparin  Indication: chest pain/ACS  Allergies  Allergen Reactions  . Elavil [Amitriptyline Hcl]     Patient Measurements: Height: 5\' 7"  (170.2 cm) Weight: 140 lb (63.5 kg) IBW/kg (Calculated) : 61.6  Vital Signs: Temp: 99.1 F (37.3 C) (09/15 0117) Temp Source: Oral (09/15 0117) BP: 137/85 (09/15 0117) Pulse Rate: 109 (09/15 0117)  Labs: Recent Labs    02/26/19 2138 02/27/19 0003  HGB 10.3*  --   HCT 31.9*  --   PLT 211  --   CREATININE 0.50  --   TROPONINIHS 29* 619*    Estimated Creatinine Clearance: 76.4 mL/min (by C-G formula based on SCr of 0.5 mg/dL).   Medical History: Past Medical History:  Diagnosis Date  . Abnormal Pap smear of cervix 08/03/2015  . Benign essential HTN 08/03/2015  . History of chicken pox   . Migraine headache 08/03/2015    Assessment: 58 y/o M from home with chest pain, to start heparin, hx COVID+ in march, troponin is elevated, Hgb 10.3, renal function good, PTA meds reviewed.   Goal of Therapy:  Heparin level 0.3-0.7 units/ml Monitor platelets by anticoagulation protocol: Yes   Plan:  Heparin 4000 units BOLUS Start heparin drip at 850 units/hr 1000 HL Daily CBC/HL Monitor for bleeding   Narda Bonds 02/27/2019,1:21 AM

## 2019-02-27 NOTE — H&P (View-Only) (Signed)
Cardiology Consultation:  Patient ID: Carla West MRN: [id number]; DOB: Apr 20, 1961  Admit date: 02/26/2019 Date of Consult: 02/27/2019  Primary Care Provider: Mackie Pai, PA-C Primary Cardiologist: No primary care provider on file. Primary Electrophysiologist:  None  Patient Profile:  Carla West is a 58 y.o. female with a hx of hypertension, newly diagnosed hyperthyroidism who is being seen today for the evaluation of chest pain/non-STEMI at the request of Aline August, MD.  History of Present Illness:  Carla West presents with a 1 day history of chest pressure.  She reports she has been evaluated for shortness of breath and chest pressure since her diagnosis of coronavirus in March of this year.  She reports for the past 3 weeks she is had intermittent chest pressure that last 10 to 15 minutes.  She states the pain can occur anytime mainly at night, and resolves without any intervention.  She reports no identifiable triggers.  Of note, she had an echocardiogram in August of this year that was normal.  She also had a nuclear myocardial perfusion imaging stress test that was normal on August/20/2020.  It appears her symptoms have progressively gotten worse.  She was evaluated by endocrinology yesterday, and diagnosed with hyperthyroidism.  She is extremely hyperthyroid on exam.  She reports 15 to 20 pound weight loss that is unintentional.  She also reports intermittent palpitations as well as anxiety.  In the emergency room, her heart rate was noted to be sinus tachycardia, blood pressure 127/84, respiratory rate 16.  Laboratory data showed an initial high-sensitivity troponin of 29 that has increased to 619.  She continues to have chest pain.  She was given a nitro paste and this improved her chest pain, but had to take it off due to headache.  Kidney function is normal.  A bedside echocardiogram was performed that showed no evidence of wall motion abnormality, normal ejection  fraction.  No significant valvular heart disease.  EKG demonstrates sinus tachycardia with heart rate of 109, LVH is noted with repolarization abnormality.  There are diffuse ST depressions that are likely rate related.  Heart Pathway Score:       Past Medical History: Past Medical History:  Diagnosis Date   Abnormal Pap smear of cervix 08/03/2015   Benign essential HTN 08/03/2015   History of chicken pox    Migraine headache 08/03/2015    Past Surgical History: Past Surgical History:  Procedure Laterality Date   TUBAL LIGATION       Home Medications:  Prior to Admission medications   Medication Sig Start Date End Date Taking? Authorizing Provider  amLODipine (NORVASC) 10 MG tablet TAKE 1 TABLET BY MOUTH ONCE DAILY Patient taking differently: Take 10 mg by mouth daily.  01/19/19  Yes Mosie Lukes, MD  aspirin EC 81 MG tablet Take 1 tablet (81 mg total) by mouth daily. 01/29/19  Yes Park Liter, MD  metoprolol tartrate (LOPRESSOR) 100 MG tablet Take 1 tablet (100 mg total) by mouth daily. 02/26/19  Yes Philemon Kingdom, MD  albuterol (VENTOLIN HFA) 108 (90 Base) MCG/ACT inhaler Inhale 2 puffs into the lungs every 6 (six) hours as needed for wheezing or shortness of breath. Patient not taking: Reported on 02/27/2019 12/12/18   Saguier, Percell Miller, PA-C  alendronate (FOSAMAX) 70 MG tablet Take 1 tablet (70 mg total) by mouth every 7 (seven) days. Take with a full glass of water on an empty stomach. Patient not taking: Reported on 02/27/2019 04/11/18   Mosie Lukes, MD  almotriptan (AXERT) 12.5 MG tablet TAKE 1 TABLET BY MOUTH TWICE DAILY AS NEEDED FOR MIGRAINE. MAY REPEAT IN 2 HOURS IF NEEDED Patient not taking: Reported on 02/27/2019 09/20/18   Bradd Canary, MD  butalbital-acetaminophen-caffeine (FIORICET) 50-325-40 MG tablet TAKE 1 TABLET BY MOUTH EVERY 6 HOURS AS NEEDED FOR HEADACHE Patient not taking: TAKE 1 TABLET BY MOUTH EVERY 6 HOURS AS NEEDED FOR HEADACHE 10/29/18    Bradd Canary, MD  cyclobenzaprine (FLEXERIL) 5 MG tablet Take 0.5-2 tablets (2.5-10 mg total) by mouth at bedtime as needed for muscle spasms. Patient not taking: Reported on 02/27/2019 04/11/18   Bradd Canary, MD  HYDROcodone-acetaminophen [facility]) 5-325 MG tablet Take 1 tablet by mouth every 6 (six) hours as needed for moderate pain. Patient not taking: Reported on 02/27/2019 10/27/18   Saguier, Ramon Dredge, PA-C  ibuprofen (ADVIL,MOTRIN) 800 MG tablet Take 1 tablet (800 mg total) by mouth every 8 (eight) hours as needed for headache or moderate pain. Patient not taking: Reported on 02/27/2019 06/12/18   Long, Arlyss Repress, MD  ondansetron (ZOFRAN ODT) 4 MG disintegrating tablet Take 1 tablet (4 mg total) by mouth every 8 (eight) hours as needed for nausea. Patient not taking: Reported on 02/27/2019 06/12/18   Long, Arlyss Repress, MD  promethazine (PHENERGAN) 25 MG tablet Take 1 tablet (25 mg total) by mouth every 8 (eight) hours as needed for nausea or vomiting. Patient not taking: Reported on 02/27/2019 06/16/18   Bradd Canary, MD  traZODone (DESYREL) 50 MG tablet TAKE 1/2 TO 1 TABLET BY MOUTH AT BEDTIME AS NEEDED FOR SLEEP Patient not taking: Reported on 02/27/2019 12/14/18   Bradd Canary, MD    Inpatient Medications: Scheduled Meds:  amLODipine  10 mg Oral Daily   aspirin EC  81 mg Oral Daily   atorvastatin  80 mg Oral q1800   metoprolol tartrate  100 mg Oral Daily   Continuous Infusions:  sodium chloride 75 mL/hr at 02/27/19 0325   heparin 850 Units/hr (02/27/19 0122)   PRN Meds: acetaminophen, albuterol, hydrALAZINE, morphine injection, nitroGLYCERIN, ondansetron (ZOFRAN) IV  Allergies:    Allergies  Allergen Reactions   Elavil [Amitriptyline Hcl] Other (See Comments)    Jittery     Social History:   Social History   Socioeconomic History   Marital status: Divorced    Spouse name: Not on file   Number of children: Not on file   Years of education: Not on file    Highest education level: Not on file  Occupational History   Not on file  Social Needs   Financial resource strain: Not on file   Food insecurity    Worry: Not on file    Inability: Not on file   Transportation needs    Medical: Not on file    Non-medical: Not on file  Tobacco Use   Smoking status: Never Smoker   Smokeless tobacco: Never Used  Substance and Sexual Activity   Alcohol use: No   Drug use: No   Sexual activity: Never    Comment: lives with brother. 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  Lifestyle   Physical activity    Days per week: Not on file    Minutes per session: Not on file   Stress: Not on file  Relationships   Social connections    Talks on phone: Not on file    Gets together: Not on file    Attends religious service: Not on file  Active member of club or organization: Not on file    Attends meetings of clubs or organizations: Not on file    Relationship status: Not on file   Intimate partner violence    Fear of current or ex partner: Not on file    Emotionally abused: Not on file    Physically abused: Not on file    Forced sexual activity: Not on file  Other Topics Concern   Not on file  Social History Narrative   Not on file     Family History:    Family History  Problem Relation Age of Onset   Hypertension Mother    Heart disease Mother    Hyperlipidemia Father    Breast cancer Maternal Grandmother    Hyperlipidemia Sister    Other Brother        plan [place] [employer]   Migraines Son    Heart disease Brother        CHF, arrythmia   Hypertension Brother    Gout Brother    Hypertension Brother      ROS:  All other ROS reviewed and negative. Pertinent positives noted in the HPI.     Physical Exam/Data:   Vitals:   02/27/19 0800 02/27/19 0815 02/27/19 0830 02/27/19 0845  BP: (!) 153/94 (!) 144/87 (!) 141/89 135/84  Pulse: (!) 116 (!) 120 (!) 108 (!) 112  Resp: (!) 23 13 (!)  24 (!) 21  Temp:      TempSrc:      SpO2: 99% 99% 98% 98%  Weight:      Height:         Intake/Output Summary (Last 24 hours) at 02/27/2019 1035 Last data filed at 02/27/2019 0325 Gross per 24 hour  Intake 1000 ml  Output --  Net 1000 ml    Last 3 Weights 02/26/2019 02/26/2019 02/15/2019  Weight (lbs) 140 lb 139 lb 139 lb  Weight (kg) 63.504 kg 63.05 kg 63.05 kg    Body mass index is 21.93 kg/m.  General: Ill-appearing, sweaty Heart: Atraumatic, normal size  Eyes: PEERLA, EOMI  Neck: Supple, no JVD Endocrine: No thryomegaly Cardiac: Normal S1, S2; 3/6 holosystolic murmur best heard in the pulmonic position Lungs: Clear to auscultation bilaterally, no wheezing, rhonchi or rales  Abd: Soft, nontender, no hepatomegaly  Ext: Trace edema Musculoskeletal: No deformities, BUE and BLE strength normal and equal Skin: Warm and dry, no rashes   Neuro: Alert and oriented to person, place, time, and situation, CNII-XII grossly intact, no focal deficits  Psych: Normal mood and affect   EKG:  The EKG was personally reviewed and demonstrates: Sinus tachycardia, heart rate 109, likely LVH, diffuse ST depressions Telemetry:  Telemetry was personally reviewed and demonstrates: Sinus tachycardia heart rate 110s  Relevant CV Studies: Ejection fraction 60% on my review, no wall motion abnormalities noted on this limited study  Laboratory Data: High Sensitivity Troponin:   Recent Labs  Lab 02/26/19 2138 02/27/19 0003  TROPONINIHS 29* 619*     Cardiac EnzymesNo results for input(s): TROPONINI in the last 168 hours. No results for input(s): TROPIPOC in the last 168 hours.  Chemistry Recent Labs  Lab 02/26/19 2138  NA 141  K 3.0*  CL 110  CO2 20*  GLUCOSE 109*  BUN 20  CREATININE 0.50  CALCIUM 9.1  GFRNONAA >60  GFRAA >60  ANIONGAP 11    No results for input(s): PROT, ALBUMIN, AST, ALT, ALKPHOS, BILITOT in the last 168 hours. Hematology  Recent Labs  °Lab 02/26/19 °2138  °WBC 5.8   °RBC 3.86*  °HGB 10.3*  °HCT 31.9*  °MCV 82.6  °MCH 26.7  °MCHC 32.3  °RDW 13.7  °PLT 211  ° °BNPNo results for input(s): BNP, PROBNP in the last 168 hours.  °DDimer  °Recent Labs  °Lab 02/26/19 °2340  °DDIMER 0.31  ° ° °Radiology/Studies:  °Dg Chest Portable 1 View ° °Result Date: 02/26/2019 °CLINICAL DATA:  Chest pain on the left EXAM: PORTABLE CHEST 1 VIEW COMPARISON:  02/15/2019 FINDINGS: The heart size and mediastinal contours are within normal limits. Both lungs are clear. The visualized skeletal structures are unremarkable. IMPRESSION: No active disease. Electronically Signed   By: Mark  Lukens M.D.   On: 02/26/2019 23:14  ° ° °Assessment and Plan:  °1. Non-ST elevation myocardial infarction versus non-MI troponin elevation in the setting of thyroid storm °-Her overall picture is more consistent with thyroid storm, and increased myocardial oxygen demand.  However it is interesting that her chest pain does resolve with nitroglycerin.  She was started on a heparin drip, and informed she would need a cardiac catheterization today.  Given the elevation in her cardiac enzymes, I think it is not unreasonable proceed with cardiac cath today.  She will remain n.p.o. for this °-Her echocardiogram is reassuring there are no wall motion abnormalities °-Overall I think this will end up being a non-MI troponin elevation in the setting of thyroid storm.  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 O'Neal, MD °[place]   [facility]  °02/27/2019 10:35 AM ° °

## 2019-02-27 NOTE — Progress Notes (Signed)
  Echocardiogram 2D Echocardiogram has been performed.  Carla West G Cuca Benassi 02/27/2019, 9:32 AM

## 2019-02-27 NOTE — ED Notes (Signed)
Pt up to BR with assist daughter in room

## 2019-02-27 NOTE — H&P (Addendum)
History and Physical    Carla West AYT:[id number] DOB: 14-Jan-1961 DOA: 02/26/2019  Referring MD/NP/PA:   PCP: Mackie Pai, PA-C   Patient coming from:  The patient is coming from home.  At baseline, pt is independent for most of ADL.        Chief Complaint: Chest pain  HPI: Carla West is a 58 y.o. female with medical history significant of hypertension, hyperlipidemia, migraine headache, thyrotoxicosis, who presents with chest pain.  Patient states that she has been having intermittent chest pain for about 2 weeks, which has worsened today.  The chest pain is located left-sided chest, radiating to the left arm, 10 out of 10 at worst time, currently chest pain-free. It is associated with shortness of breath.  Patient does not have cough.  No fever or chills.  Patient has loose stool bowel movement recently.  No nausea, vomiting or abdominal pain.  No symptoms of UTI or unilateral weakness. Pt had positive Covid 19 in March, the repeated test was negative last week per his son. Of note, pt has hx of thyrotoxicosis. Endocrinologist, Dr. Cruzita Lederer is doing work up for pt currently.  ED Course: pt was found to have troponin 29 -->619, negative d-dimer, negative COVID-19 test, WBC 5.8, potassium 3.0, renal function normal, temperature 99.1, blood pressure 137/85, tachycardia, tachypnea, oxygen saturation 98% on room air.  Chest x-ray negative.  Patient is admitted to telemetry bed as inpatient.  Cardiology, Dr. Kalman Shan was consulted.  Review of Systems:   General: no fevers, chills, no body weight gain, has fatigue HEENT: no blurry vision, hearing changes or sore throat Respiratory: has dyspnea, no coughing, wheezing CV: has chest pain, no palpitations GI: no nausea, vomiting, abdominal pain, constipation. Has diarrhea. GU: no dysuria, burning on urination, increased urinary frequency, hematuria  Ext: no leg edema Neuro: no unilateral weakness, numbness, or tingling, no vision change or  hearing loss Skin: no rash, no skin tear. MSK: No muscle spasm, no deformity, no limitation of range of movement in spin Heme: No easy bruising.  Travel history: No recent long distant travel.  Allergy:  Allergies  Allergen Reactions  . Elavil [Amitriptyline Hcl] Other (See Comments)    Jittery     Past Medical History:  Diagnosis Date  . Abnormal Pap smear of cervix 08/03/2015  . Benign essential HTN 08/03/2015  . History of chicken pox   . Migraine headache 08/03/2015    Past Surgical History:  Procedure Laterality Date  . TUBAL LIGATION      Social History:  reports that she has never smoked. She has never used smokeless tobacco. She reports that she does not drink alcohol or use drugs.  Family History:  Family History  Problem Relation Age of Onset  . Hypertension Mother   . Heart disease Mother   . Hyperlipidemia Father   . Breast cancer Maternal Grandmother   . Hyperlipidemia Sister   . Other Brother        plan crash in [employer]  . Migraines Son   . Heart disease Brother        CHF, arrythmia  . Hypertension Brother   . Gout Brother   . Hypertension Brother      Prior to Admission medications   Medication Sig Start Date End Date Taking? Authorizing Provider  albuterol (VENTOLIN HFA) 108 (90 Base) MCG/ACT inhaler Inhale 2 puffs into the lungs every 6 (six) hours as needed for wheezing or shortness of breath. 12/12/18   Saguier, Percell Miller, PA-C  alendronate (FOSAMAX) 70 MG tablet Take 1 tablet (70 mg total) by mouth every 7 (seven) days. Take with a full glass of water on an empty stomach. 04/11/18   Bradd Canary, MD  almotriptan (AXERT) 12.5 MG tablet TAKE 1 TABLET BY MOUTH TWICE DAILY AS NEEDED FOR MIGRAINE. MAY REPEAT IN 2 HOURS IF NEEDED 09/20/18   Bradd Canary, MD  amLODipine (NORVASC) 10 MG tablet TAKE 1 TABLET BY MOUTH ONCE DAILY 01/19/19   Bradd Canary, MD  aspirin EC 81 MG tablet Take 1 tablet (81 mg total) by mouth daily. 01/29/19   Georgeanna Lea, MD  butalbital-acetaminophen-caffeine (FIORICET) [phone] MG tablet TAKE 1 TABLET BY MOUTH EVERY 6 HOURS AS NEEDED FOR HEADACHE 10/29/18   Bradd Canary, MD  cyclobenzaprine (FLEXERIL) 5 MG tablet Take 0.5-2 tablets (2.5-10 mg total) by mouth at bedtime as needed for muscle spasms. 04/11/18   Bradd Canary, MD  HYDROcodone-acetaminophen (NORCO) 5-325 MG tablet Take 1 tablet by mouth every 6 (six) hours as needed for moderate pain. 10/27/18   Saguier, Ramon Dredge, PA-C  ibuprofen (ADVIL,MOTRIN) 800 MG tablet Take 1 tablet (800 mg total) by mouth every 8 (eight) hours as needed for headache or moderate pain. 06/12/18   Long, Arlyss Repress, MD  metoprolol tartrate (LOPRESSOR) 100 MG tablet Take 1 tablet (100 mg total) by mouth daily. 02/26/19   [name], MD  ondansetron (ZOFRAN ODT) 4 MG disintegrating tablet Take 1 tablet (4 mg total) by mouth every 8 (eight) hours as needed for nausea. 06/12/18   Long, Arlyss Repress, MD  promethazine (PHENERGAN) 25 MG tablet Take 1 tablet (25 mg total) by mouth every 8 (eight) hours as needed for nausea or vomiting. 06/16/18   Bradd Canary, MD  traZODone (DESYREL) 50 MG tablet TAKE 1/2 TO 1 TABLET BY MOUTH AT BEDTIME AS NEEDED FOR SLEEP 12/14/18   Bradd Canary, MD    Physical Exam: Vitals:   02/27/19 0015 02/27/19 0100 02/27/19 0115 02/27/19 0117  BP: (!) 142/86 130/81 137/85 137/85  Pulse: (!) 109 (!) 111 (!) 114 (!) 109  Resp: 16 (!) 24 20 (!) 24  Temp:    99.1 F (37.3 C)  TempSrc:    Oral  SpO2: 98% 99% 99% 99%  Weight:      Height:       General: Not in acute distress HEENT:       Eyes: PERRL, EOMI, no scleral icterus.       ENT: No discharge from the ears and nose, no pharynx injection, no tonsillar enlargement.        Neck: No JVD, no bruit, no mass felt. Heme: No neck lymph node enlargement. Cardiac: S1/S2, RRR, tachycardia, no murmurs, No gallops or rubs. Respiratory: No rales, wheezing, rhonchi or rubs. GI: Soft, nondistended, nontender, no  rebound pain, no organomegaly, BS present. GU: No hematuria Ext: No pitting leg edema bilaterally. 2+DP/PT pulse bilaterally. Musculoskeletal: No joint deformities, No joint redness or warmth, no limitation of ROM in spin. Skin: No rashes.  Neuro: Alert, oriented X3, cranial nerves II-XII grossly intact, moves all extremities normally. Psych: Patient is not psychotic, no suicidal or hemocidal ideation.  Labs on Admission: I have personally reviewed following labs and imaging studies  CBC: Recent Labs  Lab 02/26/19 2138  WBC 5.8  HGB 10.3*  HCT 31.9*  MCV 82.6  PLT 211   Basic Metabolic Panel: Recent Labs  Lab 02/26/19 2138  NA 141  K  3.0*  CL 110  CO2 20*  GLUCOSE 109*  BUN 20  CREATININE 0.50  CALCIUM 9.1   GFR: Estimated Creatinine Clearance: 76.4 mL/min (by C-G formula based on SCr of 0.5 mg/dL). Liver Function Tests: No results for input(s): AST, ALT, ALKPHOS, BILITOT, PROT, ALBUMIN in the last 168 hours. No results for input(s): LIPASE, AMYLASE in the last 168 hours. No results for input(s): AMMONIA in the last 168 hours. Coagulation Profile: No results for input(s): INR, PROTIME in the last 168 hours. Cardiac Enzymes: No results for input(s): CKTOTAL, CKMB, CKMBINDEX, TROPONINI in the last 168 hours. BNP (last 3 results) Recent Labs    01/23/19 1122  PROBNP 152.0*   HbA1C: No results for input(s): HGBA1C in the last 72 hours. CBG: No results for input(s): GLUCAP in the last 168 hours. Lipid Profile: No results for input(s): CHOL, HDL, LDLCALC, TRIG, CHOLHDL, LDLDIRECT in the last 72 hours. Thyroid Function Tests: No results for input(s): TSH, T4TOTAL, FREET4, T3FREE, THYROIDAB in the last 72 hours. Anemia Panel: No results for input(s): VITAMINB12, FOLATE, FERRITIN, TIBC, IRON, RETICCTPCT in the last 72 hours. Urine analysis: No results found for: COLORURINE, APPEARANCEUR, LABSPEC, PHURINE, GLUCOSEU, HGBUR, BILIRUBINUR, KETONESUR, PROTEINUR,  UROBILINOGEN, NITRITE, LEUKOCYTESUR Sepsis Labs: @LABRCNTIP (procalcitonin:4,lacticidven:4) )No results found for this or any previous visit (from the past 240 hour(s)).   Radiological Exams on Admission: Dg Chest Portable 1 View  Result Date: 02/26/2019 CLINICAL DATA:  Chest pain on the left EXAM: PORTABLE CHEST 1 VIEW COMPARISON:  02/15/2019 FINDINGS: The heart size and mediastinal contours are within normal limits. Both lungs are clear. The visualized skeletal structures are unremarkable. IMPRESSION: No active disease. Electronically Signed   By: Alcide CleverMark  Lukens M.D.   On: 02/26/2019 23:14     EKG: Independently reviewed.  Sinus rhythm, QTC 404, low voltage, tachycardia, ST depression in inferior leads and in V4-V6.   Assessment/Plan Principal Problem:   NSTEMI (non-ST elevated myocardial infarction) (HCC) Active Problems:   Essential hypertension   Migraine headache   Hyperlipidemia   Thyrotoxicosis without thyroid storm   Hypokalemia   NSTEMI (non-ST elevated myocardial infarction) and chest pain: D-dimer negative.  Troponin 619.  EKG showed ST depression in inferior leads and V4-V6. Dr. Okey Dupreose of card was consulted.  - will admit to tele bed as inpt - Trend Trop - Repeat EKG in the am  - prn Nitroglycerin, Morphine, ASA, metoprolol - start lipitor 80 mg daily - Risk factor stratification: will check FLP and A1C  - check UDS - f/u card recommendations  HTN: Blood pressure 137/85 -Continue home medications: Amlodipine and metoprolol -IV hydralazine prn  Migraine headache: pt was on butalbital-acetaminophen-caffeine (FIORICET) 50-325-40 MG tablet before, not taking this med currently. No worsening HA currently -prn tylenol  Hyperlipidemia: -start lipitor as above  Hypokalemia: K=3.0 on admission. - Repleted - Check Mg level  Thyrotoxicosis without thyroid storm: pt is following up with Dr. Elvera LennoxGherghe. Last seen was yesterday. TSH <0.01, free T3 3.5 and free T4 4.77 on  01/23/19. Per Dr. Charlean SanfilippoGherghe's note, Luiz BlareGraves' disease is the most likely diagnosis. Other differential diagnosis include thyroiditis and toxic multinodular goiter/ toxic adenoma. Per Dr. Charlean SanfilippoGherghe's note, "will check the TSH, fT3 and fT4 and also add thyroid stimulating antibodies to screen for Graves' disease.  If the tests remain abnormal, we may need an uptake and scan to differentiate between the 3 above possible etiologies".  -will continue metoprolol now -f/u with Dr. Elvera LennoxGherghe  Inpatient status:  # Patient requires inpatient status  due to high intensity of service, high risk for further deterioration and high frequency of surveillance required.  I certify that at the point of admission it is my clinical judgment that the patient will require inpatient hospital care spanning beyond 2 midnights from the point of admission.  . This patient has multiple chronic comorbidities including hypertension, hyperlipidemia, migraine headache, thyrotoxicosis . Now patient has presenting with NSTEMI and chest pain, trop elevated to 619. Marland Kitchen. The worrisome physical exam findings include tachycardia . The initial radiographic and laboratory data are worrisome because of positive troponin VI 19, hypokalemia, ischemic change on EKG (ST depression in inferior leads and V4-V6). . Current medical needs: please see my assessment and plan . Predictability of an adverse outcome (risk): Patient has multiple comorbidities, now presents with  non-STEMI and chest pain, troponin elevated to 619.  EKG also showed ischemic change with ST depression in inferior leads and V4-V6.  Her presentation is highly complicated.  Patient is at high risk for deteriorating.  Patient may need cardiac cath for further work-up.  Will need to be treated in the hospital for at least 2 days.           DVT ppx: on IV heparin Code Status: Full code Family Communication: None at bed side. Disposition Plan:  Anticipate discharge back to previous home  environment Consults called:  Dr. Okey Dupreose of card Admission status:   Inpatient/tele      Date of Service 02/27/2019    Lorretta HarpXilin Aleksander Edmiston [facility]   If 7PM-7AM, please contact night-coverage [email or web address] Password [email or web address] 02/27/2019, 3:29 AM

## 2019-02-27 NOTE — Progress Notes (Signed)
[place] for Heparin  Indication: chest pain/ACS  Allergies  Allergen Reactions  . Elavil [Amitriptyline Hcl] Other (See Comments)    Jittery     Patient Measurements: Height: 5\' 7"  (170.2 cm) Weight: 140 lb (63.5 kg) IBW/kg (Calculated) : 61.6  Vital Signs: Temp: 98.6 F (37 C) (09/15 0545) Temp Source: Oral (09/15 0545) BP: 147/86 (09/15 0915) Pulse Rate: 109 (09/15 0915)  Labs: Recent Labs    02/26/19 2138 02/27/19 0003 02/27/19 1000  HGB 10.3*  --   --   HCT 31.9*  --   --   PLT 211  --   --   HEPARINUNFRC  --   --  0.51  CREATININE 0.50  --   --   TROPONINIHS 29* 619*  --     Estimated Creatinine Clearance: 76.4 mL/min (by C-G formula based on SCr of 0.5 mg/dL).   Assessment: 58 y/o M from home with chest pain on IV heparin per pharmacy dosing consult. Patient has a history of COVID+ in march now with elevated troponin.  Initial heparin level is therapeutic at 0.51 on 850 units/hr.  Hgb 10.3, renal function good, PTA meds reviewed.   Goal of Therapy:  Heparin level 0.3-0.7 units/ml Monitor platelets by anticoagulation protocol: Yes   Plan:  Continue heparin drip at 850 units/hr Repeat heparin level in 6 hours to confirm. Daily CBC/HL Monitor for bleeding  Sloan Leiter, PharmD, BCPS, BCCCP Clinical Pharmacist Please refer to [facility] for [place] numbers 02/27/2019,10:52 AM

## 2019-02-27 NOTE — ED Notes (Signed)
Admit dr into see pt 

## 2019-02-27 NOTE — Progress Notes (Signed)
Pt TR band removed at 6387 with no complications. At 2255 pt ambulated to bathroom with assistance from NT and radial site began to bleed. Pressure was held to site for 20 minutes- bleeding stopped and coflex/gauze applied. Will continue to monitor pt closely

## 2019-02-27 NOTE — Telephone Encounter (Signed)
Received a from patients brother stating patient is in ED with chest pain.  Message was inaudible so I called patients sister Ivin Booty.  She states pt is currently undergoing test to assess her heart. Advised will forward message to MD however explained that when patients are in hospital that have a history of heart failure, her doctors are notified of same.  Sister would like to speak with Dr Haroldine Laws more in detail.  Message forwarded to MD.

## 2019-02-27 NOTE — Progress Notes (Addendum)
Patient ID: Carla [place], female   DOB: 05-Jan-1961, 58 y.o.   MRN: [id number] Patient admitted early this morning for chest pain.  Cardiology has been consulted.  Patient seen and examined at bedside and plan of care discussed with him.  Will follow further recommendation on cardiology.  Currently n.p.o. for probable cardiac cath.  Follow 2D echo.  Follow a.m. labs.  Currently still having chest pains.  Spoke to Dr. Gherghe/endocrinology who had seen the patient yesterday and she recommends that patient be started on methimazole 5 mg twice a day as she thinks that patient probably has Graves' disease.  She should be discharged on the same dose of methimazole and patient will need follow-up with Dr. Arman Filter office in 3 to 4 weeks time for repeat labs which will be arranged by the office.  Patient does not need inpatient endocrinology evaluation at this time as per her.  Continue metoprolol for now.

## 2019-02-27 NOTE — ED Notes (Signed)
Walked patient to the bathroom did well now patient is back in bed on the monitor with family at bedside and call bell in reach

## 2019-02-27 NOTE — ED Notes (Signed)
Cardiology in at bedside. Per pt, states he will add her on to cath schedule. Cardiology removed pts nitro paste. Pt complains of HA. Tylenol given

## 2019-02-27 NOTE — ED Notes (Addendum)
Pt alert and oriented. Warm blankets given. Pt denies pain or sob. Assisted to the restroom via wheelchair. Heparin infusing.

## 2019-02-27 NOTE — Consult Note (Signed)
Cardiology Consultation:  Patient ID: Carla [place] MRN: [id number]; DOB: Apr 20, 1961  Admit date: 02/26/2019 Date of Consult: 02/27/2019  Primary Care Provider: Mackie Pai, PA-C Primary Cardiologist: No primary care provider on file. Primary Electrophysiologist:  None  Patient Profile:  Carla West is a 58 y.o. female with a hx of hypertension, newly diagnosed hyperthyroidism who is being seen today for the evaluation of chest pain/non-STEMI at the request of Carla August, MD.  History of Present Illness:  Carla West presents with a 1 day history of chest pressure.  She reports she has been evaluated for shortness of breath and chest pressure since her diagnosis of coronavirus in March of this year.  She reports for the past 3 weeks she is had intermittent chest pressure that last 10 to 15 minutes.  She states the pain can occur anytime mainly at night, and resolves without any intervention.  She reports no identifiable triggers.  Of note, she had an echocardiogram in [place] of this year that was normal.  She also had a nuclear myocardial perfusion imaging stress test that was normal on [place]/20/2020.  It appears her symptoms have progressively gotten worse.  She was evaluated by endocrinology yesterday, and diagnosed with hyperthyroidism.  She is extremely hyperthyroid on exam.  She reports 15 to 20 pound weight loss that is unintentional.  She also reports intermittent palpitations as well as anxiety.  In the emergency room, her heart rate was noted to be sinus tachycardia, blood pressure 127/84, respiratory rate 16.  Laboratory data showed an initial high-sensitivity troponin of 29 that has increased to 619.  She continues to have chest pain.  She was given a nitro paste and this improved her chest pain, but had to take it off due to headache.  Kidney function is normal.  A bedside echocardiogram was performed that showed no evidence of wall motion abnormality, normal ejection  fraction.  No significant valvular heart disease.  EKG demonstrates sinus tachycardia with heart rate of 109, LVH is noted with repolarization abnormality.  There are diffuse ST depressions that are likely rate related.  Heart Pathway Score:       Past Medical History: Past Medical History:  Diagnosis Date   Abnormal Pap smear of cervix 08/03/2015   Benign essential HTN 08/03/2015   History of chicken pox    Migraine headache 08/03/2015    Past Surgical History: Past Surgical History:  Procedure Laterality Date   TUBAL LIGATION       Home Medications:  Prior to Admission medications   Medication Sig Start Date End Date Taking? Authorizing Provider  amLODipine (NORVASC) 10 MG tablet TAKE 1 TABLET BY MOUTH ONCE DAILY Patient taking differently: Take 10 mg by mouth daily.  01/19/19  Yes Mosie Lukes, MD  aspirin EC 81 MG tablet Take 1 tablet (81 mg total) by mouth daily. 01/29/19  Yes Park Liter, MD  metoprolol tartrate (LOPRESSOR) 100 MG tablet Take 1 tablet (100 mg total) by mouth daily. 02/26/19  Yes Philemon Kingdom, MD  albuterol (VENTOLIN HFA) 108 (90 Base) MCG/ACT inhaler Inhale 2 puffs into the lungs every 6 (six) hours as needed for wheezing or shortness of breath. Patient not taking: Reported on 02/27/2019 12/12/18   Saguier, Percell Miller, PA-C  alendronate (FOSAMAX) 70 MG tablet Take 1 tablet (70 mg total) by mouth every 7 (seven) days. Take with a full glass of water on an empty stomach. Patient not taking: Reported on 02/27/2019 04/11/18   Mosie Lukes, MD  almotriptan (AXERT) 12.5 MG tablet TAKE 1 TABLET BY MOUTH TWICE DAILY AS NEEDED FOR MIGRAINE. MAY REPEAT IN 2 HOURS IF NEEDED Patient not taking: Reported on 02/27/2019 09/20/18   Bradd Canary, MD  butalbital-acetaminophen-caffeine (FIORICET) 50-325-40 MG tablet TAKE 1 TABLET BY MOUTH EVERY 6 HOURS AS NEEDED FOR HEADACHE Patient not taking: TAKE 1 TABLET BY MOUTH EVERY 6 HOURS AS NEEDED FOR HEADACHE 10/29/18    Bradd Canary, MD  cyclobenzaprine (FLEXERIL) 5 MG tablet Take 0.5-2 tablets (2.5-10 mg total) by mouth at bedtime as needed for muscle spasms. Patient not taking: Reported on 02/27/2019 04/11/18   Bradd Canary, MD  HYDROcodone-acetaminophen [facility]) 5-325 MG tablet Take 1 tablet by mouth every 6 (six) hours as needed for moderate pain. Patient not taking: Reported on 02/27/2019 10/27/18   Saguier, Ramon Dredge, PA-C  ibuprofen (ADVIL,MOTRIN) 800 MG tablet Take 1 tablet (800 mg total) by mouth every 8 (eight) hours as needed for headache or moderate pain. Patient not taking: Reported on 02/27/2019 06/12/18   Long, Arlyss Repress, MD  ondansetron (ZOFRAN ODT) 4 MG disintegrating tablet Take 1 tablet (4 mg total) by mouth every 8 (eight) hours as needed for nausea. Patient not taking: Reported on 02/27/2019 06/12/18   Long, Arlyss Repress, MD  promethazine (PHENERGAN) 25 MG tablet Take 1 tablet (25 mg total) by mouth every 8 (eight) hours as needed for nausea or vomiting. Patient not taking: Reported on 02/27/2019 06/16/18   Bradd Canary, MD  traZODone (DESYREL) 50 MG tablet TAKE 1/2 TO 1 TABLET BY MOUTH AT BEDTIME AS NEEDED FOR SLEEP Patient not taking: Reported on 02/27/2019 12/14/18   Bradd Canary, MD    Inpatient Medications: Scheduled Meds:  amLODipine  10 mg Oral Daily   aspirin EC  81 mg Oral Daily   atorvastatin  80 mg Oral q1800   metoprolol tartrate  100 mg Oral Daily   Continuous Infusions:  sodium chloride 75 mL/hr at 02/27/19 0325   heparin 850 Units/hr (02/27/19 0122)   PRN Meds: acetaminophen, albuterol, hydrALAZINE, morphine injection, nitroGLYCERIN, ondansetron (ZOFRAN) IV  Allergies:    Allergies  Allergen Reactions   Elavil [Amitriptyline Hcl] Other (See Comments)    Jittery     Social History:   Social History   Socioeconomic History   Marital status: Divorced    Spouse name: Not on file   Number of children: Not on file   Years of education: Not on file    Highest education level: Not on file  Occupational History   Not on file  Social Needs   Financial resource strain: Not on file   Food insecurity    Worry: Not on file    Inability: Not on file   Transportation needs    Medical: Not on file    Non-medical: Not on file  Tobacco Use   Smoking status: Never Smoker   Smokeless tobacco: Never Used  Substance and Sexual Activity   Alcohol use: No   Drug use: No   Sexual activity: Never    Comment: lives with brother. 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  Lifestyle   Physical activity    Days per week: Not on file    Minutes per session: Not on file   Stress: Not on file  Relationships   Social connections    Talks on phone: Not on file    Gets together: Not on file    Attends religious service: Not on file  Active member of club or organization: Not on file    Attends meetings of clubs or organizations: Not on file    Relationship status: Not on file   Intimate partner violence    Fear of current or ex partner: Not on file    Emotionally abused: Not on file    Physically abused: Not on file    Forced sexual activity: Not on file  Other Topics Concern   Not on file  Social History Narrative   Not on file     Family History:    Family History  Problem Relation Age of Onset   Hypertension Mother    Heart disease Mother    Hyperlipidemia Father    Breast cancer Maternal Grandmother    Hyperlipidemia Sister    Other Brother        plan crash in [employer]   Migraines Son    Heart disease Brother        CHF, arrythmia   Hypertension Brother    Gout Brother    Hypertension Brother      ROS:  All other ROS reviewed and negative. Pertinent positives noted in the HPI.     Physical Exam/Data:   Vitals:   02/27/19 0800 02/27/19 0815 02/27/19 0830 02/27/19 0845  BP: (!) 153/94 (!) 144/87 (!) 141/89 135/84  Pulse: (!) 116 (!) 120 (!) 108 (!) 112  Resp: (!) 23 13 (!)  24 (!) 21  Temp:      TempSrc:      SpO2: 99% 99% 98% 98%  Weight:      Height:         Intake/Output Summary (Last 24 hours) at 02/27/2019 1035 Last data filed at 02/27/2019 0325 Gross per 24 hour  Intake 1000 ml  Output --  Net 1000 ml    Last 3 Weights 02/26/2019 02/26/2019 02/15/2019  Weight (lbs) 140 lb 139 lb 139 lb  Weight (kg) 63.504 kg 63.05 kg 63.05 kg    Body mass index is 21.93 kg/m.  General: Ill-appearing, sweaty Heart: Atraumatic, normal size  Eyes: PEERLA, EOMI  Neck: Supple, no JVD Endocrine: No thryomegaly Cardiac: Normal S1, S2; 3/6 holosystolic murmur best heard in the pulmonic position Lungs: Clear to auscultation bilaterally, no wheezing, rhonchi or rales  Abd: Soft, nontender, no hepatomegaly  Ext: Trace edema Musculoskeletal: No deformities, BUE and BLE strength normal and equal Skin: Warm and dry, no rashes   Neuro: Alert and oriented to person, place, time, and situation, CNII-XII grossly intact, no focal deficits  Psych: Normal mood and affect   EKG:  The EKG was personally reviewed and demonstrates: Sinus tachycardia, heart rate 109, likely LVH, diffuse ST depressions Telemetry:  Telemetry was personally reviewed and demonstrates: Sinus tachycardia heart rate 110s  Relevant CV Studies: Ejection fraction 60% on my review, no wall motion abnormalities noted on this limited study  Laboratory Data: High Sensitivity Troponin:   Recent Labs  Lab 02/26/19 2138 02/27/19 0003  TROPONINIHS 29* 619*     Cardiac EnzymesNo results for input(s): TROPONINI in the last 168 hours. No results for input(s): TROPIPOC in the last 168 hours.  Chemistry Recent Labs  Lab 02/26/19 2138  NA 141  K 3.0*  CL 110  CO2 20*  GLUCOSE 109*  BUN 20  CREATININE 0.50  CALCIUM 9.1  GFRNONAA >60  GFRAA >60  ANIONGAP 11    No results for input(s): PROT, ALBUMIN, AST, ALT, ALKPHOS, BILITOT in the last 168 hours. Hematology  Recent Labs  Lab 02/26/19 2138  WBC 5.8   RBC 3.86*  HGB 10.3*  HCT 31.9*  MCV 82.6  MCH 26.7  MCHC 32.3  RDW 13.7  PLT 211   BNPNo results for input(s): BNP, PROBNP in the last 168 hours.  DDimer  Recent Labs  Lab 02/26/19 2340  DDIMER 0.31    Radiology/Studies:  Dg Chest Portable 1 View  Result Date: 02/26/2019 CLINICAL DATA:  Chest pain on the left EXAM: PORTABLE CHEST 1 VIEW COMPARISON:  02/15/2019 FINDINGS: The heart size and mediastinal contours are within normal limits. Both lungs are clear. The visualized skeletal structures are unremarkable. IMPRESSION: No active disease. Electronically Signed   By: Alcide CleverMark  Lukens M.D.   On: 02/26/2019 23:14    Assessment and Plan:  1. Non-ST elevation myocardial infarction versus non-MI troponin elevation in the setting of thyroid storm -Her overall picture is more consistent with thyroid storm, and increased myocardial oxygen demand.  However it is interesting that her chest pain does resolve with nitroglycerin.  She was started on a heparin drip, and informed she would need a cardiac catheterization today.  Given the elevation in her cardiac enzymes, I think it is not unreasonable proceed with cardiac cath today.  She will remain n.p.o. for this -Her echocardiogram is reassuring there are no wall motion abnormalities -Overall I think this will end up being a non-MI troponin elevation in the setting of thyroid storm.  She does need an endocrinology consult urgently -Please continue the beta-blocker as you have already done -Continue aspirin statin and heparin drip -We will follow-up with results of heart catheterization -I have ordered repeat cardiac enzymes as well -I will also order a BNP I think she is developing high-output heart failure      For questions or updates, please contact [facility] Please consult [email or web address] for contact info under     Signed, Gerri SporeWesley T. Flora Lipps'Neal, MD [facility]   [facility]  02/27/2019 10:35 AM

## 2019-02-27 NOTE — Consult Note (Signed)
Cardiology Consult    Patient ID: Unknown Carla Mcinturff MRN: [id number], DOB/AGE: 08/21/1960   Admit date: 02/26/2019 Date of Consult: 02/27/2019  Primary Physician: Esperanza RichtersSaguier, Edward, PA-C Primary Cardiologist: Gypsy Balsamobert Krasowski, MD Requesting Provider: Lorretta HarpXilin Niu, MD  Patient Profile    Unknown Carla West is a 58 y.o. female with a history of hypertension, mild dyslipidemia, and uncontrolled thyrotoxicosis. She is being seen today for the evaluation of chest pain.  History of Present Illness    She has been experiencing a few weeks of atypical chest pain that can seem to occur at any time. She saw Dr. Bing MatterKrasowski for this on 8/17 and subsequently had a normal Lexiscan SPECT MPI and TTE that showed hyperdynamic LV systolic function without WMA, mild LVH, and mild-moderate MR. She came to the ED today for more severe sharp left sided chest pain associated also with more generalized pressure across her chest and pain down her left arm. She has had more minor episodes over the last couple of days. The pain is now occurring mostly just at rest now and is very non-exertional, however is very pleuritic. She also notes that when it occurs she sits up and this does seem to help. She also had COVID-19 in March and has still been a little short of breath since then, she has since tested negative. She has associated mild nausea at times. She denies any known fevers, chills, or rash. Has had palpitations with her thyroid disease, but no known arrhythmias. Endorses mild ankle swelling, but no PND, orthopnea.   Initial ECG shows sinus tachycardia (persistent on telemetry) with fairly diffuse ST depression, most prominent in inferior and lateral/apical leads. Initial troponin was 29, then 619. D-dimer negative and normal oxygenation. Repeat COVID is negative. She was given nitroglycernin and morphine simultaneously and together relieved her pain. She received aspirin prior to arrival and has been started on heparin.  Past  Medical History   Past Medical History:  Diagnosis Date   Abnormal Pap smear of cervix 08/03/2015   Benign essential HTN 08/03/2015   History of chicken pox    Migraine headache 08/03/2015    Past Surgical History:  Procedure Laterality Date   TUBAL LIGATION       Allergies  Allergen Reactions   Elavil [Amitriptyline Hcl]    Inpatient Medications     heparin  4,000 Units Intravenous Once    Family History    Family History  Problem Relation Age of Onset   Hypertension Mother    Heart disease Mother    Hyperlipidemia Father    Breast cancer Maternal Grandmother    Hyperlipidemia Sister    Other Brother        plan crash in [employer]   Migraines Son    Heart disease Brother        CHF, arrythmia   Hypertension Brother    Gout Brother    Hypertension Brother    She indicated that her mother is alive. She indicated that her father is deceased. She indicated that her sister is alive. She indicated that three of her four brothers are alive. She indicated that her maternal grandmother is deceased. She indicated that her maternal grandfather is deceased. She indicated that her paternal grandmother is deceased. She indicated that her paternal grandfather is deceased. She indicated that her daughter is alive. She indicated that her son is alive.   Social History    Social History   Socioeconomic History   Marital status: Divorced  Spouse name: Not on file   Number of children: Not on file   Years of education: Not on file   Highest education level: Not on file  Occupational History   Not on file  Social Needs   Financial resource strain: Not on file   Food insecurity    Worry: Not on file    Inability: Not on file   Transportation needs    Medical: Not on file    Non-medical: Not on file  Tobacco Use   Smoking status: Never Smoker   Smokeless tobacco: Never Used  Substance and Sexual Activity   Alcohol use: No   Drug use: No    Sexual activity: Never    Comment: lives with brother. 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  Lifestyle   Physical activity    Days per week: Not on file    Minutes per session: Not on file   Stress: Not on file  Relationships   Social connections    Talks on phone: Not on file    Gets together: Not on file    Attends religious service: Not on file    Active member of club or organization: Not on file    Attends meetings of clubs or organizations: Not on file    Relationship status: Not on file   Intimate partner violence    Fear of current or ex partner: Not on file    Emotionally abused: Not on file    Physically abused: Not on file    Forced sexual activity: Not on file  Other Topics Concern   Not on file  Social History Narrative   Not on file     Review of Systems    General:  No chills, fever, night sweats. +weight loss.  Cardiovascular: See HPI Dermatological: No rash, lesions/masses Respiratory: see HPI Urologic: No hematuria, dysuria Abdominal:   +nausea and loose stools. No vomiting, hematochezia, melena, or hematemesis Neurologic:  No visual changes, lateralizing weakness, changes in mental status. All other systems reviewed and are otherwise negative except as noted above.  Physical Exam    Blood pressure 130/81, pulse (!) 111, temperature 98 F (36.7 C), temperature source Oral, resp. rate (!) 24, height 5\' 7"  (1.702 m), weight 63.5 kg, SpO2 99 %.    No intake or output data in the 24 hours ending 02/27/19 0115 Wt Readings from Last 3 Encounters:  02/26/19 63.5 kg  02/26/19 63 kg  02/15/19 63 kg    CONSTITUTIONAL: alert but tired appearing, in no acute distress HEENT: oropharynx clear and moist, no mucosal lesions, conjunctiva normal, EOM intact, pupils equal, no cervical adenopathy, mild thyromegaly CARDIOVASCULAR: Regular rhythm, tachycardic. Apical systolic MR murmur and aortic flow murmur. Difficult to rule out friction  rub, fain diastolic component heard at LSB intermittently. No s3/s4. Radial pulses 2+. No JVD. No carotid bruits. PULMONARY/CHEST WALL: no deformities, normal breath sounds bilaterally, normal work of breathing ABDOMINAL: soft, non-tender, non-distended EXTREMITIES: no edema, warm and well-perfused SKIN: Dry and intact without apparent rashes or wounds.  NEUROLOGIC: alert, normal gait, no abnormal movements, cranial nerves grossly intact.   Labs    HS-troponin 29, 619  Lab Results  Component Value Date   WBC 5.8 02/26/2019   HGB 10.3 (L) 02/26/2019   HCT 31.9 (L) 02/26/2019   MCV 82.6 02/26/2019   PLT 211 02/26/2019    Recent Labs  Lab 02/26/19 2138  NA 141  K 3.0*  CL 110  CO2 20*  BUN 20  CREATININE 0.50  CALCIUM 9.1  GLUCOSE 109*   Lab Results  Component Value Date   CHOL 228 (H) 04/11/2018   HDL 80.60 04/11/2018   LDLCALC 133 (H) 04/11/2018   TRIG 70.0 04/11/2018   Lab Results  Component Value Date   DDIMER 0.31 02/26/2019     Radiology Studies    Dg Chest 2 View  Result Date: 02/15/2019 CLINICAL DATA:  Diagnosed with COVID-19 in May, chest pain, shortness of breath, intermittent sweats, history hypertension EXAM: CHEST - 2 VIEW COMPARISON:  01/23/2019 FINDINGS: Upper normal heart size. Mediastinal contours and pulmonary vascularity normal. Lungs clear. No infiltrate, pleural effusion or pneumothorax. Bones unremarkable. IMPRESSION: Normal exam. Electronically Signed   By: Ulyses Southward M.D.   On: 02/15/2019 09:48   Dg Chest Portable 1 View  Result Date: 02/26/2019 CLINICAL DATA:  Chest pain on the left EXAM: PORTABLE CHEST 1 VIEW COMPARISON:  02/15/2019 FINDINGS: The heart size and mediastinal contours are within normal limits. Both lungs are clear. The visualized skeletal structures are unremarkable. IMPRESSION: No active disease. Electronically Signed   By: Alcide Clever M.D.   On: 02/26/2019 23:14    ECG & Cardiac Imaging    ECG: NSR with first degree AVB,  ST depression most prominent in inferior and anterolateral leads, borderline aVR elevation - personally reviewed.  Recent TTE and Lexiscan SPECT MPI were personally reviewed, results outlined in HPI.  Assessment & Plan    Myopericarditis vs NSTEMI: Her chest pain is very atypical, much more pleuritic and positional, not exertional. Overall most suggestive of pericarditis as PE essentially excluded with negative D-dimer. This could be sequelae of her uncontrolled thyrotoxicosis. Her troponin trend and ST changes on ECG however are more typical for ACS, and after talking with the patient and her son, they would favor excluding this diagnosis as well. Stress CM also on differential, probably less likely. She is currently chest pain free. - Trend troponin every 4 hours to peak. Lipid panel and A1c ordered. Daily ECG. - Limited TTE this morning to assess LV function/wall motion and for new effusion  - NPO for left heart cath today - Agree with continuing heparin for possible ACS - Continue daily aspirin and high-intensity statin - On Lopressor 100mg  daily.Marland KitchenMarland KitchenI would change to propranolol if we are going to specifically treat thyrotoxicosis, otherwise this should be modified to BID dosing. - Okay to remove nitro patch, currently having headaches and not clear if this is helping her. - If cath negative for obstructive disease would treat for pericarditis with colchicine 1.2mg  BID x 2 doses, then 0.6mg  BID for 3 months AND high dose NSAID    Signed, Clerance Lav, MD 02/27/2019, 1:15 AM  For questions or updates, please contact   Please consult [email or web address] for contact info under Cardiology/STEMI.

## 2019-02-28 ENCOUNTER — Ambulatory Visit: Payer: Managed Care, Other (non HMO) | Admitting: Cardiology

## 2019-02-28 ENCOUNTER — Encounter (HOSPITAL_COMMUNITY): Payer: Self-pay | Admitting: Cardiovascular Disease

## 2019-02-28 ENCOUNTER — Telehealth: Payer: Self-pay | Admitting: Cardiology

## 2019-02-28 LAB — COMPREHENSIVE METABOLIC PANEL
ALT: 35 U/L (ref 0–44)
AST: 45 U/L — ABNORMAL HIGH (ref 15–41)
Albumin: 2.8 g/dL — ABNORMAL LOW (ref 3.5–5.0)
Alkaline Phosphatase: 97 U/L (ref 38–126)
Anion gap: 10 (ref 5–15)
BUN: 14 mg/dL (ref 6–20)
CO2: 21 mmol/L — ABNORMAL LOW (ref 22–32)
Calcium: 9 mg/dL (ref 8.9–10.3)
Chloride: 106 mmol/L (ref 98–111)
Creatinine, Ser: 0.55 mg/dL (ref 0.44–1.00)
GFR calc Af Amer: >60 mL/min (ref >60)
GFR calc non Af Amer: >60 mL/min (ref >60)
Glucose, Bld: 132 mg/dL — ABNORMAL HIGH (ref 70–99)
Potassium: 3.7 mmol/L (ref 3.5–5.1)
Sodium: 137 mmol/L (ref 135–145)
Total Bilirubin: 0.9 mg/dL (ref 0.3–1.2)
Total Protein: 5.5 g/dL — ABNORMAL LOW (ref 6.5–8.1)

## 2019-02-28 LAB — CBC WITH DIFFERENTIAL/PLATELET
Abs Immature Granulocytes: 0.01 10*3/uL (ref 0.00–0.07)
Basophils Absolute: 0 10*3/uL (ref 0.0–0.1)
Basophils Relative: 0 %
Eosinophils Absolute: 0 10*3/uL (ref 0.0–0.5)
Eosinophils Relative: 0 %
HCT: 27.1 % — ABNORMAL LOW (ref 36.0–46.0)
Hemoglobin: 9.3 g/dL — ABNORMAL LOW (ref 12.0–15.0)
Immature Granulocytes: 0 %
Lymphocytes Relative: 29 %
Lymphs Abs: 1.4 10*3/uL (ref 0.7–4.0)
MCH: 27.9 pg (ref 26.0–34.0)
MCHC: 34.3 g/dL (ref 30.0–36.0)
MCV: 81.4 fL (ref 80.0–100.0)
Monocytes Absolute: 0.8 10*3/uL (ref 0.1–1.0)
Monocytes Relative: 16 %
Neutro Abs: 2.7 10*3/uL (ref 1.7–7.7)
Neutrophils Relative %: 55 %
Platelets: 178 10*3/uL (ref 150–400)
RBC: 3.33 MIL/uL — ABNORMAL LOW (ref 3.87–5.11)
RDW: 13.7 % (ref 11.5–15.5)
WBC: 4.9 10*3/uL (ref 4.0–10.5)
nRBC: 0 % (ref 0.0–0.2)

## 2019-02-28 LAB — MAGNESIUM: Magnesium: 1.7 mg/dL (ref 1.7–2.4)

## 2019-02-28 LAB — HEMOGLOBIN A1C
Hgb A1c MFr Bld: 5.5 % (ref 4.8–5.6)
Mean Plasma Glucose: 111 mg/dL

## 2019-02-28 LAB — HIV ANTIBODY (ROUTINE TESTING W REFLEX): HIV Screen 4th Generation wRfx: NONREACTIVE

## 2019-02-28 MED ORDER — ATORVASTATIN CALCIUM 80 MG PO TABS: 80.0000 mg | ORAL_TABLET | Freq: Every day | ORAL | 0 refills | Status: DC

## 2019-02-28 MED ORDER — METHIMAZOLE 5 MG PO TABS: 5.0000 mg | ORAL_TABLET | Freq: Two times a day (BID) | ORAL | 0 refills | Status: DC

## 2019-02-28 MED ORDER — TICAGRELOR 90 MG PO TABS: 90.0000 mg | ORAL_TABLET | Freq: Two times a day (BID) | ORAL | 0 refills | Status: DC

## 2019-02-28 MED ORDER — METOPROLOL SUCCINATE ER 100 MG PO TB24: 100.0000 mg | ORAL_TABLET | Freq: Every day | ORAL | Status: DC

## 2019-02-28 MED FILL — ATORVASTATIN CALCIUM 80 MG: 80 | 30 days supply | Qty: 30 | Fill #0

## 2019-02-28 MED FILL — methIMAzole 10 MG TABS: 10 | 30 days supply | Qty: 30 | Fill #0

## 2019-02-28 MED FILL — BRILINTA 90 MG TABLET: 90 | 30 days supply | Qty: 60 | Fill #0

## 2019-02-28 NOTE — Progress Notes (Signed)
CARDIAC REHAB PHASE I   PRE:  Rate/Rhythm: 111 ST  BP:  Supine: 115/74  Sitting:   Standing:    SaO2: 100%RA  MODE:  Ambulation: 470 ft   POST:  Rate/Rhythm: 118 ST  BP:  Supine:   Sitting: 120/76  Standing:    SaO2: 97%RA [id number] Pt walked 470 ft on RA with steady gait with minimal asst. Legs felt a little weak but tolerated well. Slightly DOE but HR at 118. Denied CP or dizziness. MI education completed with pt who voiced understanding. Stressed importance of brilinta with stent. Needs to see case manager. Discussed NTG use, MI restrictions, heart healthy food choices, walking for ex and CRP 2. Referred to [place] CRP 2. Pt understands that her hyperthyroidism will need to be addressed prior to CRP 2 as HR resting in bed was 102-104.  Encouraged pt to begin walking slowly for ex as HR elevated. Gave walking guidelines.  Pt is interested in participating in Virtual Cardiac and Pulmonary Rehab. Pt advised that Virtual Cardiac and Pulmonary Rehab is provided at no cost to the patient.  Checklist:  1. Pt has smart device  ie smartphone and/or ipad for downloading an app  Yes 2. Reliable internet/wifi service    Yes 3. Understands how to use their smartphone and navigate within an app.  Yes   Pt verbalized understanding and is in agreement.    Graylon Good, RN BSN  02/28/2019 9:20 AM

## 2019-02-28 NOTE — Progress Notes (Signed)
Cardiology Progress Note  Patient ID: [name] MRN: [id number] DOB: June 06, 1961 Date of Encounter: 02/28/2019  Primary Cardiologist: No primary care provider on file.  Subjective  Status post PCI to proximal left circumflex yesterday.  Doing well today no chest pain reported.  Telemetry is unremarkable.  She remains in sinus tachycardia due to her profound hyperthyroidism, but this is being treated.  She did have some bleeding around her radial cath site overnight, but on inspection this was very benign-appearing.  She has great pulses and no evidence of hematoma.  ROS:  All other ROS reviewed and negative. Pertinent positives noted in the HPI.     Inpatient Medications  Scheduled Meds:  amLODipine  10 mg Oral Daily   aspirin EC  81 mg Oral Daily   atorvastatin  80 mg Oral q1800   methimazole  5 mg Oral BID   metoprolol tartrate  100 mg Oral Daily   sodium chloride flush  3 mL Intravenous Q12H   sodium chloride flush  3 mL Intravenous Q12H   ticagrelor  90 mg Oral BID   Continuous Infusions:  sodium chloride 75 mL/hr at 02/27/19 0325   sodium chloride     PRN Meds: sodium chloride, acetaminophen, albuterol, hydrALAZINE, morphine injection, nitroGLYCERIN, ondansetron (ZOFRAN) IV, sodium chloride flush   Vital Signs   Vitals:   02/27/19 1745 02/27/19 2117 02/28/19 0624 02/28/19 0820  BP: 127/84 108/73 113/76 115/74  Pulse: 100 95 (!) 101   Resp:  20 20   Temp:  98.2 F (36.8 C) 99 F (37.2 C)   TempSrc:  Oral Oral   SpO2: 100% 100% 99%   Weight:      Height:        Intake/Output Summary (Last 24 hours) at 02/28/2019 0910 Last data filed at 02/28/2019 0000 Gross per 24 hour  Intake 615 ml  Output 400 ml  Net 215 ml   Last 3 Weights 02/27/2019 02/26/2019 02/26/2019  Weight (lbs) 133 lb 9.6 oz 140 lb 139 lb  Weight (kg) 60.601 kg 63.504 kg 63.05 kg      Telemetry  Overnight telemetry shows sinus tachycardia with heart rate in the 110 range, which I  personally reviewed.   ECG  The most recent ECG shows sinus tachycardia, LVH with repolarization abnormality, ST depressions are improved from yesterday, which I personally reviewed.   Physical Exam   Vitals:   02/27/19 1745 02/27/19 2117 02/28/19 0624 02/28/19 0820  BP: 127/84 108/73 113/76 115/74  Pulse: 100 95 (!) 101   Resp:  20 20   Temp:  98.2 F (36.8 C) 99 F (37.2 C)   TempSrc:  Oral Oral   SpO2: 100% 100% 99%   Weight:      Height:         Intake/Output Summary (Last 24 hours) at 02/28/2019 0910 Last data filed at 02/28/2019 0000 Gross per 24 hour  Intake 615 ml  Output 400 ml  Net 215 ml    Last 3 Weights 02/27/2019 02/26/2019 02/26/2019  Weight (lbs) 133 lb 9.6 oz 140 lb 139 lb  Weight (kg) 60.601 kg 63.504 kg 63.05 kg    Body mass index is 20.92 kg/m.  General: Well nourished, well developed, in no acute distress Heart: Atraumatic, normal size  Eyes: PEERLA, EOMI  Neck: Supple, no JVD Endocrine: No thryomegaly Cardiac: Tachycardic on examination with 2 out of 6 systolic ejection murmur Lungs: Clear to auscultation bilaterally, no wheezing, rhonchi or rales  Abd: Soft, nontender,  no hepatomegaly  Ext: No edema, pulses 2+ Musculoskeletal: No deformities, BUE and BLE strength normal and equal Skin: Warm and dry, no rashes   Neuro: Alert and oriented to person, place, time, and situation, CNII-XII grossly intact, no focal deficits  Psych: Normal mood and affect   Labs  High Sensitivity Troponin:   Recent Labs  Lab 02/26/19 2138 02/27/19 0003 02/27/19 0500 02/27/19 1410  TROPONINIHS 29* 619* 2,569* 3,340*     Cardiac EnzymesNo results for input(s): TROPONINI in the last 168 hours. No results for input(s): TROPIPOC in the last 168 hours.  Chemistry Recent Labs  Lab 02/26/19 2138 02/27/19 1410 02/28/19 0355  NA 141 140 137  K 3.0* 4.4 3.7  CL 110 108 106  CO2 20* 21* 21*  GLUCOSE 109* 76 132*  BUN 20 12 14   CREATININE 0.50 0.56 0.55  CALCIUM 9.1  9.3 9.0  PROT  --   --  5.5*  ALBUMIN  --   --  2.8*  AST  --   --  45*  ALT  --   --  35  ALKPHOS  --   --  97  BILITOT  --   --  0.9  GFRNONAA >60 >60 >60  GFRAA >60 >60 >60  ANIONGAP 11 11 10     Hematology Recent Labs  Lab 02/26/19 2138 02/28/19 0355  WBC 5.8 4.9  RBC 3.86* 3.33*  HGB 10.3* 9.3*  HCT 31.9* 27.1*  MCV 82.6 81.4  MCH 26.7 27.9  MCHC 32.3 34.3  RDW 13.7 13.7  PLT 211 178   BNP Recent Labs  Lab 02/27/19 1410  BNP 624.9*    DDimer  Recent Labs  Lab 02/26/19 2340  DDIMER 0.31     Radiology  Dg Chest Portable 1 View  Result Date: 02/26/2019 CLINICAL DATA:  Chest pain on the left EXAM: PORTABLE CHEST 1 VIEW COMPARISON:  02/15/2019 FINDINGS: The heart size and mediastinal contours are within normal limits. Both lungs are clear. The visualized skeletal structures are unremarkable. IMPRESSION: No active disease. Electronically Signed   By: Inez Catalina M.D.   On: 02/26/2019 23:14    Cardiac Studies  TTE 9/15  1. The left ventricle has normal systolic function, with an ejection fraction of 60-65%. The cavity size was normal. There is mildly increased left ventricular wall thickness. No evidence of left ventricular regional wall motion abnormalities.  2. The right ventricle has normal systolc function. The cavity was normal. There is no increase in right ventricular wall thickness.  3. Left atrial size was moderately dilated.  4. The mitral valve is abnormal. Mild thickening of the mitral valve leaflet. Mitral valve regurgitation is mild to moderate by color flow Doppler.  5. The aortic valve is tricuspid Mild sclerosis of the aortic valve.  6. The ascending aorta and aortic root are normal in size and structure.  7. The inferior vena cava was normal in size with <50% respiratory variability.  LHC//PCI  Prox RCA lesion is 30% stenosed.  Prox Cx lesion is 99% stenosed.  Mid LAD lesion is 20% stenosed.  2nd Mrg lesion is 40% stenosed.  A  drug-eluting stent was successfully placed using a STENT RESOLUTE ONYX 3.5X12.  Post intervention, there is a 0% residual stenosis.   1. Mild non-obstructive disease in the LAD and RCA 2. 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  3. Successful PTCA/DES x 1 proximal Circumflex  Patient Profile  Carla West is a 58 y.o. female with newly diagnosed hypothyroidism, hypertension admitted 9/15 with chest pain and found to have non-STEMI.  Status post PCI to proximal left circumflex.  Assessment & Plan   1.  Non-ST elevation myocardial infarction -Ejection fraction normal without any wall motion normalities -Cath site looks good, and she was given precautions -Nonobstructive disease in the other arteries, status post PCI to proximal left circumflex -We will continue aspirin and Brilinta with plans for this for 1 year, please provide prescription for this upon discharge -I have switched her metoprolol to 100 mg of succinate daily -We will continue Lipitor 80 mg upon discharge -I will arrange follow-up for her to see [place] 1 week upon discharge in clinic and then she will follow-up with me in 2 to 3 months after this. -Please continue her treatment for her hyperthyroidism, and arrange follow-up for her to see her endocrinologist upon discharge  [facility] will sign off.   Medication Recommendations: Medications as above Other recommendations (labs, testing, etc): None Follow up as an outpatient: We will arrange follow-up for her to see [place] in clinic in 1 week post MI, and then she will see me in 2 months for follow-up  For questions or updates, please contact [facility] Please consult [email or web address] for contact info under        Signed, Gerri Spore T. Flora Lipps, MD [facility]   [facility]  02/28/2019 9:10 AM

## 2019-02-28 NOTE — Telephone Encounter (Signed)
error 

## 2019-03-01 ENCOUNTER — Telehealth (HOSPITAL_COMMUNITY): Payer: Self-pay

## 2019-03-01 ENCOUNTER — Ambulatory Visit: Payer: Managed Care, Other (non HMO) | Admitting: Medical

## 2019-03-01 NOTE — Telephone Encounter (Signed)
Pt insurance is active and benefits verified through [place]. Co-pay $50.00, DED $0.00/$0.00 met, out of pocket $5,000.00/$2,340.44 met, co-insurance 0%. No pre-authorization required. Passport, 03/01/2019 @ 315PM, GQB#[id number]  Will contact patient to see if she is interested in the Cardiac Rehab Program. If interested, patient will need to complete follow up appt. Once completed, patient will be contacted for scheduling upon review by the RN Navigator.

## 2019-03-01 NOTE — Discharge Summary (Addendum)
Physician Discharge Summary  Carla West DTO:[id number] DOB: 01/19/1961 DOA: 02/26/2019  PCP: Mackie Pai, PA-C  Admit date: 02/26/2019 Discharge date: 02/28/2019  Time spent: 62minutes  Recommendations for Outpatient Follow-up:  1. Cardiology Dr. Audie Box in 2 weeks 2. Endocrinology Dr. Benjiman Core in 3weeks with labs, please monitor CBC while on methimazole   Discharge Diagnoses:  Principal Problem:   NSTEMI (non-ST elevated myocardial infarction) [facility]) Active Problems:   Essential hypertension   Migraine headache   Hyperlipidemia   Thyrotoxicosis without thyroid storm   Hypokalemia   Discharge Condition: Stable  Diet recommendation: Heart healthy  Filed Weights   02/26/19 2125 02/27/19 1421  Weight: 63.5 kg 60.6 kg    History of present illness:   Carla West is a 58 y.o. female with medical history significant of hypertension, hyperlipidemia, migraine headache, thyrotoxicosis, who presents with chest pain.  Patient states that she has been having intermittent chest pain for about 2 weeks, which has worsened today.  The chest pain is located left-sided chest, radiating to the left arm, 10 out of 10 at worst time  Hospital Course:   1.  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  Severe hypothyroidism, possibly Graves' disease  -recently seen by Dr. Benjiman Core, the day  of admission, labs consistent with hyperthyroidism, TSH was less than 0.01 and free T4 was 5.3, my partner discussed cussed with Dr. Cruzita Lederer and patient was started on methimazole 5 mg twice daily at discharge, will need follow-up with endocrinology in 3 to 4 weeks with repeat labs to check CBC as well while on methimazole  Procedures: Left heart catheterization  LHC//PCI  Prox RCA lesion is 30% stenosed.  Prox Cx lesion is 99% stenosed.  Mid LAD lesion is 20% stenosed.  2nd Mrg lesion is 40% stenosed.  A drug-eluting stent was successfully placed using a STENT RESOLUTE ONYX 3.5X12.  Post intervention, there is a 0% residual stenosis.  1. Mild non-obstructive disease in the LAD and RCA 2. 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  3. Successful PTCA/DES x 1 proximal Circumflex     Consultations:  Cardiology  Discharge Exam: Vitals:   02/28/19 0624 02/28/19 0820  BP: 113/76 115/74  Pulse: (!) 101   Resp: 20   Temp: 99 F (37.2 C)   SpO2: 99%     General: Alert awake oriented x3 Cardiovascular: S1-S2/regular rate rhythm Respiratory: Clear bilaterally  Discharge Instructions   Discharge Instructions    Amb Referral to Cardiac Rehabilitation   Complete by: As directed    Diagnosis:  Coronary Stents NSTEMI     After initial evaluation and assessments completed: Virtual Based Care may be provided alone or in conjunction with Phase 2 Cardiac Rehab based on patient barriers.: Yes   Diet - low sodium heart healthy   Complete by: As directed    Increase activity slowly   Complete by:  As directed      Allergies as of 02/28/2019      Reactions   Elavil [amitriptyline Hcl] Other (See Comments)   Jittery       Medication List    STOP taking these medications   albuterol 108 (90 Base) MCG/ACT inhaler Commonly known as: VENTOLIN HFA   alendronate 70 MG tablet Commonly known as:  FOSAMAX   almotriptan 12.5 MG tablet Commonly known as: AXERT   butalbital-acetaminophen-caffeine 50-325-40 MG tablet Commonly known as: FIORICET   cyclobenzaprine 5 MG tablet Commonly known as: FLEXERIL   HYDROcodone-acetaminophen 5-325 MG tablet Commonly known as: Norco   ibuprofen 800 MG tablet Commonly known as: ADVIL   ondansetron 4 MG disintegrating tablet Commonly known as: Zofran ODT   promethazine 25 MG tablet Commonly known as: PHENERGAN   traZODone 50 MG tablet Commonly known as: DESYREL     TAKE these medications   amLODipine 10 MG tablet Commonly known as: NORVASC TAKE 1 TABLET BY MOUTH ONCE DAILY   aspirin EC 81 MG tablet Take 1 tablet (81 mg total) by mouth daily.   atorvastatin 80 MG tablet Commonly known as: LIPITOR Take 1 tablet (80 mg total) by mouth daily at 6 PM.   methimazole 5 MG tablet Commonly known as: TAPAZOLE Take 1 tablet (5 mg total) by mouth 2 (two) times daily.   metoprolol tartrate 100 MG tablet Commonly known as: LOPRESSOR Take 1 tablet (100 mg total) by mouth daily.   ticagrelor 90 MG Tabs tablet Commonly known as: BRILINTA Take 1 tablet (90 mg total) by mouth 2 (two) times daily.      Allergies  Allergen Reactions  . Elavil [Amitriptyline Hcl] Other (See Comments)    Jittery    Follow-up Information    cardiology [facility] Follow up.   Why: office will call with appt       [name], MD. Schedule an appointment as soon as possible for a visit in 3 week(s).   Specialty: Internal Medicine Contact information: [address] [place] [address] [phone]            The results of significant diagnostics from this hospitalization (including imaging, microbiology, ancillary and laboratory) are listed below for reference.    Significant Diagnostic Studies: Dg Chest 2 View  Result Date: 02/15/2019 CLINICAL DATA:  Diagnosed with COVID-19 in May, chest pain, shortness of  breath, intermittent sweats, history hypertension EXAM: CHEST - 2 VIEW COMPARISON:  01/23/2019 FINDINGS: Upper normal heart size. Mediastinal contours and pulmonary vascularity normal. Lungs clear. No infiltrate, pleural effusion or pneumothorax. Bones unremarkable. IMPRESSION: Normal exam. Electronically Signed   By: Ulyses Southward M.D.   On: 02/15/2019 09:48   Dg Chest Portable 1 View  Result Date: 02/26/2019 CLINICAL DATA:  Chest pain on the left EXAM: PORTABLE CHEST 1 VIEW COMPARISON:  02/15/2019 FINDINGS: The heart size and mediastinal contours are within normal limits. Both lungs are clear. The visualized skeletal structures are unremarkable. IMPRESSION: No active disease. Electronically Signed   By: Alcide Clever M.D.   On: 02/26/2019 23:14    Microbiology: Recent Results (from the past 240 hour(s))  SARS CORONAVIRUS 2 (TAT 6-24 HRS) Nasopharyngeal Nasopharyngeal Swab     Status: None   Collection Time: 02/26/19 11:19 PM   Specimen: Nasopharyngeal Swab  Result Value Ref Range Status   SARS Coronavirus 2 NEGATIVE NEGATIVE Final    Comment: (NOTE) SARS-CoV-2 target nucleic acids are NOT DETECTED. The SARS-CoV-2 RNA  is generally detectable in upper and lower respiratory specimens during the acute phase of infection. Negative results do not preclude SARS-CoV-2 infection, do not rule out co-infections with other pathogens, and should not be used as the sole basis for treatment or other patient management decisions. Negative results must be combined with clinical observations, patient history, and epidemiological information. The expected result is Negative. Fact Sheet for Patients: HairSlick.nohttps://www.fda.gov/media/138098/download Fact Sheet for Healthcare Providers: quierodirigir.comhttps://www.fda.gov/media/138095/download This test is not yet approved or cleared by the [place] FDA and  has been authorized for detection and/or diagnosis of SARS-CoV-2 by FDA under an Emergency Use Authorization (EUA). This  EUA will remain  in effect (meaning this test can be used) for the duration of the COVID-19 declaration under Section 56 4(b)(1) of the Act, 21 U.S.C. section 360bbb-3(b)(1), unless the authorization is terminated or revoked sooner. Performed at [facility], [address] [place], [place] [address]      Labs: Basic Metabolic Panel: Recent Labs  Lab 02/26/19 2138 02/27/19 1028 02/27/19 1410 02/28/19 0355  NA 141  --  140 137  K 3.0*  --  4.4 3.7  CL 110  --  108 106  CO2 20*  --  21* 21*  GLUCOSE 109*  --  76 132*  BUN 20  --  12 14  CREATININE 0.50  --  0.56 0.55  CALCIUM 9.1  --  9.3 9.0  MG  --  1.7  --  1.7   Liver Function Tests: Recent Labs  Lab 02/28/19 0355  AST 45*  ALT 35  ALKPHOS 97  BILITOT 0.9  PROT 5.5*  ALBUMIN 2.8*   No results for input(s): LIPASE, AMYLASE in the last 168 hours. No results for input(s): AMMONIA in the last 168 hours. CBC: Recent Labs  Lab 02/26/19 2138 02/28/19 0355  WBC 5.8 4.9  NEUTROABS  --  2.7  HGB 10.3* 9.3*  HCT 31.9* 27.1*  MCV 82.6 81.4  PLT 211 178   Cardiac Enzymes: No results for input(s): CKTOTAL, CKMB, CKMBINDEX, TROPONINI in the last 168 hours. BNP: BNP (last 3 results) Recent Labs    02/27/19 1410  BNP 624.9*    ProBNP (last 3 results) Recent Labs    01/23/19 1122  PROBNP 152.0*    CBG: No results for input(s): GLUCAP in the last 168 hours.     Signed:  Zannie CovePreetha Lulabelle Desta MD.  [facility] 03/01/2019, 2:37 PM

## 2019-03-01 NOTE — Telephone Encounter (Signed)
Called patient to see if she is interested in the Cardiac Rehab Program. Patient expressed interest. Explained scheduling process and went over insurance, patient verbalized understanding. Will contact patient for scheduling once f/u has been completed.  Pt would like her insurance recheck after her f/u appt (03/15/2019).

## 2019-03-02 ENCOUNTER — Ambulatory Visit: Payer: Managed Care, Other (non HMO) | Admitting: Medical

## 2019-03-02 DIAGNOSIS — Z0289 Encounter for other administrative examinations: Secondary | ICD-10-CM

## 2019-03-05 LAB — THYROID STIMULATING IMMUNOGLOBULIN: TSI: 467 % baseline — ABNORMAL HIGH (ref <140)

## 2019-03-05 MED ORDER — METHIMAZOLE 5 MG PO TABS: 10.0000 mg | ORAL_TABLET | Freq: Two times a day (BID) | ORAL | 3 refills | Status: DC

## 2019-03-09 ENCOUNTER — Telehealth: Payer: Self-pay | Admitting: Cardiovascular Disease

## 2019-03-09 NOTE — Telephone Encounter (Signed)
Routed to primary cardiologist 

## 2019-03-09 NOTE — Telephone Encounter (Signed)
New Message:     Pt says she she a note to be excuse  from work until her office visit on 03-15-19 please. Please e mail it to [email or web address]

## 2019-03-13 ENCOUNTER — Encounter: Payer: Self-pay | Admitting: Internal Medicine

## 2019-03-13 ENCOUNTER — Other Ambulatory Visit: Payer: Self-pay

## 2019-03-13 ENCOUNTER — Other Ambulatory Visit: Payer: Self-pay | Admitting: Family Medicine

## 2019-03-13 NOTE — Telephone Encounter (Addendum)
Pt. requesting to be followed by Dr. Farris Has and has appt with him Oct 1st. She will get work note from him at that time.

## 2019-03-14 ENCOUNTER — Telehealth: Payer: Self-pay | Admitting: Medical

## 2019-03-14 ENCOUNTER — Ambulatory Visit: Payer: Managed Care, Other (non HMO) | Admitting: Physician Assistant

## 2019-03-14 ENCOUNTER — Other Ambulatory Visit: Payer: Self-pay

## 2019-03-14 ENCOUNTER — Ambulatory Visit (INDEPENDENT_AMBULATORY_CARE_PROVIDER_SITE_OTHER): Payer: Managed Care, Other (non HMO) | Admitting: Medical

## 2019-03-14 ENCOUNTER — Encounter: Payer: Self-pay | Admitting: Medical

## 2019-03-14 VITALS — BP 142/80 | HR 84 | Temp 97.6°F | Resp 16 | Ht 67.0 in | Wt 136.4 lb

## 2019-03-14 DIAGNOSIS — I10 Essential (primary) hypertension: Secondary | ICD-10-CM | POA: Diagnosis not present

## 2019-03-14 DIAGNOSIS — H81399 Other peripheral vertigo, unspecified ear: Secondary | ICD-10-CM

## 2019-03-14 DIAGNOSIS — I708 Atherosclerosis of other arteries: Secondary | ICD-10-CM

## 2019-03-14 DIAGNOSIS — Z23 Encounter for immunization: Secondary | ICD-10-CM | POA: Diagnosis not present

## 2019-03-14 DIAGNOSIS — E0590 Thyrotoxicosis, unspecified without thyrotoxic crisis or storm: Secondary | ICD-10-CM

## 2019-03-14 DIAGNOSIS — D649 Anemia, unspecified: Secondary | ICD-10-CM | POA: Diagnosis not present

## 2019-03-14 DIAGNOSIS — E785 Hyperlipidemia, unspecified: Secondary | ICD-10-CM

## 2019-03-14 DIAGNOSIS — M24531 Contracture, right wrist: Secondary | ICD-10-CM

## 2019-03-14 DIAGNOSIS — I252 Old myocardial infarction: Secondary | ICD-10-CM

## 2019-03-14 DIAGNOSIS — R739 Hyperglycemia, unspecified: Secondary | ICD-10-CM | POA: Diagnosis not present

## 2019-03-14 DIAGNOSIS — R42 Dizziness and giddiness: Secondary | ICD-10-CM

## 2019-03-14 DIAGNOSIS — I7090 Unspecified atherosclerosis: Secondary | ICD-10-CM

## 2019-03-14 LAB — COMPREHENSIVE METABOLIC PANEL
ALT: 43 U/L — ABNORMAL HIGH (ref 0–35)
AST: 32 U/L (ref 0–37)
Albumin: 4 g/dL (ref 3.5–5.2)
Alkaline Phosphatase: 149 U/L — ABNORMAL HIGH (ref 39–117)
BUN: 15 mg/dL (ref 6–23)
CO2: 25 mEq/L (ref 19–32)
Calcium: 9 mg/dL (ref 8.4–10.5)
Chloride: 107 mEq/L (ref 96–112)
Creatinine, Ser: 0.62 mg/dL (ref 0.40–1.20)
GFR: 120.42 mL/min (ref >60.00)
Glucose, Bld: 88 mg/dL (ref 70–99)
Potassium: 4.1 mEq/L (ref 3.5–5.1)
Sodium: 141 mEq/L (ref 135–145)
Total Bilirubin: 0.3 mg/dL (ref 0.2–1.2)
Total Protein: 6.7 g/dL (ref 6.0–8.3)

## 2019-03-14 LAB — CBC WITH DIFFERENTIAL/PLATELET
Basophils Absolute: 0 10*3/uL (ref 0.0–0.1)
Basophils Relative: 0.3 % (ref 0.0–3.0)
Eosinophils Absolute: 0 10*3/uL (ref 0.0–0.7)
Eosinophils Relative: 0.6 % (ref 0.0–5.0)
HCT: 34.1 % — ABNORMAL LOW (ref 36.0–46.0)
Hemoglobin: 11.2 g/dL — ABNORMAL LOW (ref 12.0–15.0)
Lymphocytes Relative: 28.4 % (ref 12.0–46.0)
Lymphs Abs: 1.2 10*3/uL (ref 0.7–4.0)
MCHC: 33 g/dL (ref 30.0–36.0)
MCV: 81.9 fl (ref 78.0–100.0)
Monocytes Absolute: 0.5 10*3/uL (ref 0.1–1.0)
Monocytes Relative: 11 % (ref 3.0–12.0)
Neutro Abs: 2.6 10*3/uL (ref 1.4–7.7)
Neutrophils Relative %: 59.7 % (ref 43.0–77.0)
Platelets: 318 10*3/uL (ref 150.0–400.0)
RBC: 4.16 Mil/uL (ref 3.87–5.11)
RDW: 14.6 % (ref 11.5–15.5)
WBC: 4.3 10*3/uL (ref 4.0–10.5)

## 2019-03-14 LAB — IRON: Iron: 48 ug/dL (ref 42–145)

## 2019-03-14 LAB — FERRITIN: Ferritin: 136.8 ng/mL (ref 10.0–291.0)

## 2019-03-14 MED ORDER — AMITRIPTYLINE HCL 10 MG PO TABS: 10.0000 mg | ORAL_TABLET | Freq: Every day | ORAL | 0 refills | Status: DC

## 2019-03-14 MED ORDER — METOPROLOL TARTRATE 100 MG PO TABS: 100.0000 mg | ORAL_TABLET | Freq: Every day | ORAL | 3 refills | Status: DC

## 2019-03-14 MED FILL — methIMAzole 5 MG TABS: 5 | 30 days supply | Qty: 120 | Fill #0

## 2019-03-14 MED FILL — AMITRIPTYLINE HCL 10 MG TAB: 10 | 30 days supply | Qty: 30 | Fill #0

## 2019-03-14 NOTE — Telephone Encounter (Signed)
I saw pt today. I remember vitals were normal. Looks like you did not put those in. If you would find sheet and put those in. Except bp. I rechecked on exam.

## 2019-03-14 NOTE — Telephone Encounter (Signed)
Pt stated needing refill sent to [place] since pt in office today needing Methlmazole tab, please advise.

## 2019-03-14 NOTE — Telephone Encounter (Signed)
Please advise 

## 2019-03-14 NOTE — Patient Instructions (Addendum)
Bp moderate controlled today.  Recent nstemi. -Recommended to continue aspirin and Brilinta for 1 year -Continued on home beta-blocker as well, also started on atorvastatin daily -Follow-up with cardiology in 1 to 2 weeks  For hyperthyroid. Continue methimazole and follow up with Dr. Renne Crigler.  For low k, will repeat cmp today.  For elevate sugar continue low sugar diet.  Continue statin.  For anemia hx, will get cbc, iron level and ordered stool cards for blood test. Referral to GI in process.   For transient rt wrist flexion yesterday, rare dizziness and hx of recent MI, I decide to try to get ct of head no contrast routine. Counseled if she gets any recurrent symptoms pending insurance auth attempt then be seen in ED.  Follow up date one month or as needed.

## 2019-03-14 NOTE — Telephone Encounter (Signed)
Pt was informed med was sent on 03-05-2019 to [place], pt will pick up there.

## 2019-03-14 NOTE — Progress Notes (Signed)
Subjective:    Patient ID: Carla West, female    DOB: 1960/11/10, 58 y.o.   MRN: [id number]  HPI   Pt in for follow from hospitalization. Date of admission 02/26/2019. Date of discharge 02/28/2019.   Principal Problem:   NSTEMI (non-ST elevated myocardial infarction) [facility]) Active Problems:   Essential hypertension   Migraine headache   Hyperlipidemia   Thyrotoxicosis without thyroid storm   Hypokalemia  Pt hospital course summary below.  1.  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  Severe hypothyroidism, possibly Graves' disease  -recently seen by Carla West, the day of admission, labs consistent with hyperthyroidism, TSH was less than 0.01 and free T4 was 5.3, my partner discussed cussed with Carla West and patient was started on methimazole 5 mg twice daily at discharge, will need follow-up with endocrinology in 3 to 4 weeks with repeat labs to check CBC as well while on methimazole  Procedures: Left heart catheterization  LHC//PCI  Prox RCA lesion is 30% stenosed.  Prox Cx lesion is 99% stenosed.  Mid LAD lesion is 20% stenosed.  2nd Mrg lesion is 40% stenosed.  A drug-eluting stent was successfully placed using a STENT RESOLUTE ONYX 3.5X12.  Post intervention, there is a 0% residual stenosis.  1. Mild non-obstructive disease in the LAD and RCA 2. Severe stenosis  in the proximal segment of the large Circumflex artery. 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  3. Successful PTCA/DES x 1 proximal Circumflex     A/P from hospital    Prior to pt admission/day when she had severe pain she was having complaints of predominant fatigue, difficulty ambulating post covid, weight loss and loose stools. Decreased appetite and weight loss.  On prior visit with me had concern she might be having long haul syndrome post covid but at the same time worked her up each time she was in.  On  01/22/2019 she was having some daily intermittent faint chest pressure. No associated cardiac type symptoms. No pain that day I saw her. She was complaining of sever fatigue which I was considering maybe she was sob.  I worked her up that day and got cbc, tsh, b12, b1, bnp, cxr and troponin. That work up was negative. Only one set troponin since last chest pain event day before.(labs done next day in office since she had virtual visit but needed to come in next day for the labs, imaging and ekg)  Subsequent referral to cardiology did not reveal cause. Stress test done.  Thyroid abnormality found so place referral to endocrine for low tsh and mild elevated t4  Early September, I also had concern for possible chf. But up to this point bnp not significant elevated and cxr was clear. Echo also showed no chf.   Repeat ekg and troponin again negative on 02-15-2019. On that day ordered echo which showed good EF fraction. Only mitral reg.  Pt eventually did see endocrinologist on  day she went to ED for severe chest pain later after visit Work up eventually showed increased troponin and stemi.Cath blocked circumflex.   Reviewed pt hx in detail today. Answered all of patients questions.   Pt mom passed away just recently. Mom was in poor health and death expected. Pt seems to be handling passing of mom well.        Review of Systems  Constitutional: Negative for chills,  fatigue and fever.  Respiratory: Negative for cough, chest tightness, shortness of breath and wheezing.   Cardiovascular: Negative for chest pain and palpitations.  Gastrointestinal: Negative for abdominal distention, abdominal pain, anal bleeding, blood in stool, diarrhea and nausea.  Musculoskeletal: Negative for arthralgias and back pain.  Skin: Negative for rash.  Neurological: Negative for seizures, facial asymmetry, speech difficulty, weakness, light-headedness and headaches.       Pt states rt hand briefly stuck in flexed condition. 2-3 seconds. No other associated symptoms stroke symptoms.   Some dizziness when she stands. On and of when stands. Wrist flexed and brief on Sunday. 2-3.  No other associated signs or symptoms. Rt thumb numbness but after her cardiac cath.  Hematological: Negative for adenopathy. Does not bruise/bleed easily.  Psychiatric/Behavioral: Negative for behavioral problems, confusion and dysphoric mood. The patient is not nervous/anxious and is not hyperactive.       Past Medical History:  Diagnosis Date  . Abnormal Pap smear of cervix 08/03/2015  . Benign essential HTN 08/03/2015  . History of chicken pox   . Migraine headache 08/03/2015     Social History   Socioeconomic History  . Marital status: Divorced    Spouse name: Not on file  . Number of children: Not on file  . Years of education: Not on file  . Highest education level: Not on file  Occupational History  . Not on file  Social Needs  . Financial resource strain: Not on file  . Food insecurity    Worry: Not on file    Inability: Not on file  . Transportation needs    Medical: Not on file    Non-medical: Not on file  Tobacco Use  . Smoking status: Never Smoker  . Smokeless tobacco: Never Used  Substance and Sexual Activity  . Alcohol use: No  . Drug use: No  . 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 Physical activity    Days per week: Not on file    Minutes per session: Not on file  . Stress: Not on file  Relationships  . Social Herbalist on phone: Not on file    Gets together: Not on file    Attends religious service: Not on file    Active member of club or organization: Not on file    Attends meetings of clubs or organizations: Not on file    Relationship status: Not on file  . Intimate partner violence    Fear of current or ex partner: Not on file    Emotionally abused: Not on file    Physically abused: Not on file    Forced sexual activity: Not on file  Other Topics Concern  . Not on file  Social History Narrative  . Not on file    Past Surgical History:  Procedure Laterality Date  . CORONARY STENT INTERVENTION N/A 02/27/2019   Procedure: CORONARY STENT INTERVENTION;  Surgeon: Burnell Blanks, MD;  Location: [facility];  Service: Cardiovascular;  Laterality: N/A;  . LEFT HEART CATH AND CORONARY ANGIOGRAPHY N/A 02/27/2019   Procedure: LEFT HEART CATH AND CORONARY ANGIOGRAPHY;  Surgeon: Kathleene HazelMcAlhany, Christopher D, MD;  Location: [facility];  Service: Cardiovascular;  Laterality: N/A;  . TUBAL LIGATION      Family History  Problem Relation Age of Onset  . Hypertension Mother   . Heart disease Mother   . Hyperlipidemia Father   . Breast cancer Maternal Grandmother   . Hyperlipidemia Sister   . Other Brother        plan crash in [employer]  . Migraines Son   . Heart disease Brother        CHF, arrythmia  . Hypertension Brother   . Gout Brother   . Hypertension Brother     Allergies  Allergen Reactions  . Elavil [Amitriptyline Hcl] Other (See Comments)    Jittery     Current Outpatient Medications on File Prior to Visit  Medication Sig Dispense Refill  . amitriptyline (ELAVIL) 10 MG tablet Take 10 mg by mouth at bedtime.    Marland Kitchen. amLODipine (NORVASC) 10 MG tablet TAKE 1 TABLET BY MOUTH ONCE DAILY (Patient taking  differently: Take 10 mg by mouth daily. ) 30 tablet 5  . aspirin EC 81 MG tablet Take 1 tablet (81 mg total) by mouth daily. 90 tablet 3  . atorvastatin (LIPITOR) 80 MG tablet Take 1 tablet (80 mg total) by mouth daily at 6 PM. 30 tablet 0  . methimazole (TAPAZOLE) 5 MG tablet Take 2 tablets (10 mg total) by mouth 2 (two) times daily. 120 tablet 3  . ticagrelor (BRILINTA) 90 MG TABS tablet Take 1 tablet (90 mg total) by mouth 2 (two) times daily. 60 tablet 0  . traZODone (DESYREL) 50 MG tablet Take 50 mg by mouth at bedtime.     No current facility-administered medications on file prior to visit.     There were no vitals taken for this visit.      Objective:   Physical Exam  General Mental Status- Alert. General Appearance- Not in acute distress.   Skin General: Color- Normal Color. Moisture- Normal Moisture.  Neck Carotid Arteries- Normal color. Moisture- Normal Moisture. No carotid bruits. No JVD.  Chest and Lung Exam Auscultation: Breath Sounds:-Normal.  Cardiovascular Auscultation:Rythm- Regular. Murmurs & Other Heart Sounds:Auscultation of the heart reveals- No Murmurs.  Abdomen Inspection:-Inspeection Normal. Palpation/Percussion:Note:No mass. Palpation and Percussion of the abdomen reveal- Non Tender, Non Distended + BS, no rebound or guarding.  Neurologic Cranial Nerve exam:- CN III-XII intact(No nystagmus), symmetric smile. Drift Test:- No drift. Romberg Exam:- Negative.  Heal to Toe Gait exam:-Normal. Finger to Nose:- Normal/Intact Strength:- 5/5 equal and symmetric strength both upper and lower extremities.      Assessment & Plan:  Bp moderate controlled today.  Recent nstemi. -Recommended to continue aspirin and Brilinta for 1 year -Continued on home beta-blocker as well, also started on atorvastatin daily -Follow-up with cardiology in 1 to 2 weeks  For hyperthyroid. Continue methimazole and follow up with Dr. Lafe GarinGherge.  For low k, will repeat cmp  today.  For elevate sugar continue low sugar diet.  Continue statin.  For anemia hx, will get cbc, iron level and ordered stool cards for blood test. Referral to GI in process.   For transient rt wrist flexion yesterday, rare dizziness and hx of recent MI, I decide to try to get ct of head no contrast  routine. Counseled if she gets any recurrent symptoms pending insurance auth attempt then be seen in ED.  Follow up date one month or as needed.  40 minutes spent with pt. 50% of time spent counseling pt on plan going forward. Answered pt questions as well.  [name], PA-C

## 2019-03-15 ENCOUNTER — Telehealth: Payer: Self-pay

## 2019-03-15 ENCOUNTER — Encounter: Payer: Self-pay | Admitting: General Practice

## 2019-03-15 ENCOUNTER — Ambulatory Visit: Payer: Managed Care, Other (non HMO) | Admitting: General Practice

## 2019-03-15 VITALS — BP 120/86 | HR 72 | Temp 98.7°F | Ht 67.0 in | Wt 137.4 lb

## 2019-03-15 DIAGNOSIS — I2510 Atherosclerotic heart disease of native coronary artery without angina pectoris: Secondary | ICD-10-CM

## 2019-03-15 DIAGNOSIS — I479 Paroxysmal tachycardia, unspecified: Secondary | ICD-10-CM | POA: Diagnosis not present

## 2019-03-15 DIAGNOSIS — I214 Non-ST elevation (NSTEMI) myocardial infarction: Secondary | ICD-10-CM | POA: Diagnosis not present

## 2019-03-15 DIAGNOSIS — E785 Hyperlipidemia, unspecified: Secondary | ICD-10-CM

## 2019-03-15 DIAGNOSIS — I10 Essential (primary) hypertension: Secondary | ICD-10-CM

## 2019-03-15 MED ORDER — TICAGRELOR 90 MG PO TABS: 90.0000 mg | ORAL_TABLET | Freq: Two times a day (BID) | ORAL | 1 refills | Status: DC

## 2019-03-15 NOTE — Telephone Encounter (Signed)
Copied from [place] [id number]. Topic: General - Other >> Mar 15, 2019  4:26 PM Yvette Rack wrote: Reason for CRM: Pt stated she received a call from [facility] regarding her lab results but moments later she received a call from imaging. Pt stated she is confused as to why a CT of the head is needed if her lab results were ok. Pt requests call back.

## 2019-03-15 NOTE — Telephone Encounter (Signed)
Done

## 2019-03-15 NOTE — Progress Notes (Signed)
Cardiology Clinic Note   Patient Name: Carla West Date of Encounter: 03/15/2019  Primary Care Provider:  [name], PA-C Primary Cardiologist:  Reatha Harps, MD  Patient Profile    Carla West 58 year old female presents today for follow-up status post end STEMI with PCI and DESx1 to her proximal left circumflex.  Past Medical History    Past Medical History:  Diagnosis Date   Abnormal Pap smear of cervix 08/03/2015   Benign essential HTN 08/03/2015   History of chicken pox    Migraine headache 08/03/2015   Past Surgical History:  Procedure Laterality Date   CORONARY STENT INTERVENTION N/A 02/27/2019   Procedure: CORONARY STENT INTERVENTION;  Surgeon: Kathleene Hazel, MD;  Location: [facility];  Service: Cardiovascular;  Laterality: N/A;   LEFT HEART CATH AND CORONARY ANGIOGRAPHY N/A 02/27/2019   Procedure: LEFT HEART CATH AND CORONARY ANGIOGRAPHY;  Surgeon: Kathleene Hazel, MD;  Location: [facility];  Service: Cardiovascular;  Laterality: N/A;   TUBAL LIGATION      Allergies  Allergies  Allergen Reactions   Elavil [Amitriptyline Hcl] Other (See Comments)    Jittery     History of Present Illness    Carla West presented to the hospital on 02/27/2019 30th of chest pressure x1 day.  She also stated she had increased shortness of breath then diagnosed with coronavirus in March 2020.  She also stated she had noticed 3 weeks worth of intermittent chest pressure that would last from 10 to 15 minutes.  Her echocardiogram from August 2020 was unremarkable.  She was evaluated by endocrinology on 02/26/2019 and diagnosed with hyperthyroidism.  She reported a 15 to 20 pound weight loss that was unintentional as well as intermittent palpitations and anxiety.  On 02/28/2019 she was taken to the Cath Lab for left heart cath and received DES x1 to her proximal left circumflex.  Her PCI also revealed a proximal RCA lesion 30%, mid LAD  lesion 20%, and second marginal lesion 40%.  She was a right radial approach and placed on DAPT.  Her PMH also includes essential hypertension, migraine, thyrotoxicosis without thyroid storm, hyperlipidemia, palpitations, and hypokalemia.  She presents the clinic today and states she feels well.  She did notice one episode of right hand numbness that included her fourth and fifth fingers however, today she has no numbness and has not had any recurrent episodes.  She also states that she has a bruise on her abdomen and bilateral bruises on her right and left lower legs.  Finally, she  has noticed a slight increase in shortness of breath with increased activity.  Her breathing returns to baseline with normal activities.  She denies chest pain,  lower extremity edema, fatigue, palpitations, melena, hematuria, hemoptysis, diaphoresis, weakness, presyncope, syncope, orthopnea, and PND.   Home Medications    Prior to Admission medications   Medication Sig Start Date End Date Taking? Authorizing Provider  amitriptyline (ELAVIL) 10 MG tablet Take 1 tablet (10 mg total) by mouth at bedtime. 03/14/19   Saguier, Ramon Dredge, PA-C  amLODipine (NORVASC) 10 MG tablet TAKE 1 TABLET BY MOUTH ONCE DAILY Patient taking differently: Take 10 mg by mouth daily.  01/19/19   Bradd Canary, MD  aspirin EC 81 MG tablet Take 1 tablet (81 mg total) by mouth daily. 01/29/19   Georgeanna Lea, MD  atorvastatin (LIPITOR) 80 MG tablet Take 1 tablet (80 mg total) by mouth daily at 6 PM. 02/28/19   Jomarie Longs,  Jacinta Shoe, MD  methimazole (TAPAZOLE) 5 MG tablet Take 2 tablets (10 mg total) by mouth 2 (two) times daily. 03/05/19   Philemon Kingdom, MD  metoprolol tartrate (LOPRESSOR) 100 MG tablet Take 1 tablet (100 mg total) by mouth daily. 03/14/19   Saguier, Percell Miller, PA-C  ticagrelor (BRILINTA) 90 MG TABS tablet Take 1 tablet (90 mg total) by mouth 2 (two) times daily. 02/28/19   Domenic Polite, MD  traZODone (DESYREL) 50 MG tablet Take 50  mg by mouth at bedtime.    [provider]    Family History    Family History  Problem Relation Age of Onset   Hypertension Mother    Heart disease Mother    Hyperlipidemia Father    Breast cancer Maternal Grandmother    Hyperlipidemia Sister    Other Brother        plan crash in [employer]   Migraines Son    Heart disease Brother        CHF, arrythmia   Hypertension Brother    Gout Brother    Hypertension Brother    She indicated that her mother is alive. She indicated that her father is deceased. She indicated that her sister is alive. She indicated that three of her four brothers are alive. She indicated that her maternal grandmother is deceased. She indicated that her maternal grandfather is deceased. She indicated that her paternal grandmother is deceased. She indicated that her paternal grandfather is deceased. She indicated that her daughter is alive. She indicated that her son is alive.  Social History    Social History   Socioeconomic History   Marital status: Divorced    Spouse name: Not on file   Number of children: Not on file   Years of education: Not on file   Highest education level: Not on file  Occupational History   Not on file  Social Needs   Financial resource strain: Not on file   Food insecurity    Worry: Not on file    Inability: Not on file   Transportation needs    Medical: Not on file    Non-medical: Not on file  Tobacco Use   Smoking status: Never Smoker   Smokeless tobacco: Never Used  Substance and Sexual Activity   Alcohol use: No   Drug use: No   Sexual activity: Never    Comment: lives with brother. 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  Lifestyle   Physical activity    Days per week: Not on file    Minutes per session: Not on file   Stress: Not on file  Relationships   Social connections    Talks on phone: Not on file    Gets together: Not on file    Attends religious  service: Not on file    Active member of club or organization: Not on file    Attends meetings of clubs or organizations: Not on file    Relationship status: Not on file   Intimate partner violence    Fear of current or ex partner: Not on file    Emotionally abused: Not on file    Physically abused: Not on file    Forced sexual activity: Not on file  Other Topics Concern   Not on file  Social History Narrative   Not on file     Review of Systems    General:  No chills, fever, night sweats or weight changes.  Cardiovascular:  No chest pain,  dyspnea on exertion, edema, orthopnea, palpitations, paroxysmal nocturnal dyspnea. Dermatological: No rash, lesions/masses.  Three quarter size contusions noted, right upper abdomen and bilateral lower extremities. Respiratory: No cough, slight dyspnea with increased activity.   Urologic: No hematuria, dysuria Abdominal:   No nausea, vomiting, diarrhea, bright red blood per rectum, melena, or hematemesis.   Neurologic:  No visual changes, wkns, changes in mental status. All other systems reviewed and are otherwise negative except as noted above.  Physical Exam    VS:  BP 120/86    Pulse 72    Temp 98.7 F (37.1 C) (Temporal)    Ht  (1.702 m)    Wt 137 lb 6.4 oz (62.3 kg)    SpO2 99%    BMI 21.52 kg/m  , BMI Body mass index is 21.52 kg/m. GEN: Well nourished, well developed, in no acute distress. HEENT: normal. Neck: Supple, no JVD, carotid bruits, or masses. Cardiac: RRR, no murmurs, rubs, or gallops. No clubbing, cyanosis, edema.  Radials/DP/PT 2+ and equal bilaterally.  Respiratory:  Respirations regular and unlabored, clear to auscultation bilaterally. GI: Soft, nontender, nondistended, BS + x 4.  Slight dyspnea with increased activity. MS: no deformity or atrophy. Skin: warm and dry, no rash.  Three quarter sized contusions, one on her right upper abdomen and bilateral lower extremities.  Right radial catheterization site clean,  dry, intact, and no drainage. Neuro:  Strength and sensation are intact. Psych: Normal affect.  Accessory Clinical Findings    ECG personally reviewed by me today-sinus rhythm with first-degree AV block 72 bpm- No acute changes  EKG 03/01/2019 Sinus tachycardia with first-degree AV block 107 bpm  Echocardiogram 02/27/2019  IMPRESSIONS    1. The left ventricle has normal systolic function, with an ejection fraction of 60-65%. The cavity size was normal. There is mildly increased left ventricular wall thickness. No evidence of left ventricular regional wall motion abnormalities.  2. The right ventricle has normal systolc function. The cavity was normal. There is no increase in right ventricular wall thickness.  3. Left atrial size was moderately dilated.  4. The mitral valve is abnormal. Mild thickening of the mitral valve leaflet. Mitral valve regurgitation is mild to moderate by color flow Doppler.  5. The aortic valve is tricuspid Mild sclerosis of the aortic valve.  6. The ascending aorta and aortic root are normal in size and structure.  7. The inferior vena cava was normal in size with <50% respiratory variability.  Cardiac catheterization 02/27/2019  Prox RCA lesion is 30% stenosed.  Prox Cx lesion is 99% stenosed.  Mid LAD lesion is 20% stenosed.  2nd Mrg lesion is 40% stenosed.  A drug-eluting stent was successfully placed using a STENT RESOLUTE ONYX 3.5X12.  Post intervention, there is a 0% residual stenosis.   1. Mild non-obstructive disease in the LAD and RCA 2. 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  3. Successful PTCA/DES x 1 proximal Circumflex   Recommendations: Dual anti-platelet therapy with ASA and Brilinta for one year. Continue statin and beta blocker.  Assessment & Plan   1.  Non-STEMI- cardiac catheterization 02/27/2019 with DES x1 to proximal circumflex lesion.  Right radial  approach.  Placed on ASA and Brilinta.  Follow-up echocardiogram showed an LVEF of 60 to 65%. Continue aspirin and Brilinta for 1 year- for slight dyspnea recommended half a cup of coffee in the morning suspect Brilinta involvement.  Appear to be  normal contusions with ASA and ticagrelor, asked patient to continue to monitor. Continue amlodipine 10 mg tablet daily Continue metoprolol tartrate 100 mg tablet daily Heart healthy low-sodium diet Increase physical activity as tolerated  2.  Essential hypertension-BP today 120/86 Continue amlodipine 10 mg tablet daily Continue metoprolol tartrate 100 mg tablet daily Low-sodium heart healthy diet Increase physical activity as tolerated- goal 150 minutes of moderate physical activity per week  3.  Hyperlipidemia-02/27/2019: Cholesterol 153; HDL 64; LDL Cholesterol 80; Triglycerides 47; VLDL 9 Continue atorvastatin 80 mg tablet daily  4.  Tachycardia- EKG sinus rhythm with first-degree AV block 72 bpm.  Being managed by endocrinology for hyperthyroid. Followed by endocrinology Continue Tapazole 10 mg tablet twice daily Continue metoprolol tartrate 100 mg tablet daily  Disposition: Follow-up with Dr. Bufford Buttner'Neill 2 months  Ronney AstersJesse M Oneill Bais, NP-C 03/15/2019, 12:18 PM

## 2019-03-15 NOTE — Patient Instructions (Addendum)
Medication Instructions:   Your physician recommends that you continue on your current medications as directed. Please refer to the Current Medication list given to you today.   If you need a refill on your cardiac medications before your next appointment, please call your pharmacy.   Lab work:  NONE ordered at this time of appointment   If you have labs (blood work) drawn today and your tests are completely normal, you will receive your results only by: Marland Kitchen MyChart Message (if you have MyChart) OR . A paper copy in the mail If you have any lab test that is abnormal or we need to change your treatment, we will call you to review the results.  Testing/Procedures:   NONE ordered at this time of appointment   Follow-Up: At [facility], you and your health needs are our priority.  As part of our continuing mission to provide you with exceptional heart care, we have created designated Provider Care Teams.  These Care Teams include your primary Cardiologist (physician) and Advanced Practice Providers (APPs -  Physician Assistants and Nurse Practitioners) who all work together to provide you with the care you need, when you need it. . You will need a follow up appointment in 2 months with Evalina Field, MD   Any Other Special Instructions Will Be Listed Below (If Applicable).

## 2019-03-16 ENCOUNTER — Telehealth: Payer: Self-pay | Admitting: Medical

## 2019-03-16 NOTE — Telephone Encounter (Signed)
I talked with pt earlier around 1:00. Not in office today but did want to discuss since cardiologist recommended she hold off on getting CT head.  Pt states cardiologist thinks her piro dizziness maybe thyroid med relates. No recurrent episodes or any gross motor/sensory function deficits.   Pt expressed that she does not want to get Ct presently. I explained that would be ok provided she watch for any recurrent symptoms. If mild recurrent dizziness then she could do out pt CT. But explained in detail if worse symptoms then should have CT through ED. Pt expressed understanding. She will be very careful if any recurrent symptoms and be cautious.  Pt expressed some concern for cost as she explained with all her cardiac work up she recenlty got $6000 bill.

## 2019-03-16 NOTE — Telephone Encounter (Signed)
Spoke with pt. Explained to pt that CT was ordered because of  Dizziness.Pt states cardiologist told her to hold off on doing anything maybe the  medication making her feel dizzy. Pt states if Percell Miller thinks she does need to do it now please let her know.

## 2019-03-16 NOTE — Telephone Encounter (Signed)
I called pt and discussed with pt. See not in epic today.

## 2019-03-21 ENCOUNTER — Encounter (HOSPITAL_COMMUNITY): Payer: Self-pay

## 2019-03-21 ENCOUNTER — Telehealth (HOSPITAL_COMMUNITY): Payer: Self-pay

## 2019-03-21 NOTE — Telephone Encounter (Signed)
**  [employer]check insurance[employer] is active and benefits verified through [facility]. Co-pay $50.00, DED $0.00/$0.00 met, out of pocket $5,000.00/$3,297.65 met, co-insurance 0%. NO pre-authorization required. Errol/[employer], 03/21/2019 @ 929AM, RAJ#[id number]

## 2019-03-21 NOTE — Telephone Encounter (Signed)
Attempted to call patient in regards to Cardiac Rehab - LM on VM  Mailed letter Will close in 2 weeks if their is no response from pt

## 2019-03-29 ENCOUNTER — Encounter: Payer: Self-pay | Admitting: Gastroenterology

## 2019-03-30 ENCOUNTER — Telehealth: Payer: Self-pay | Admitting: Cardiovascular Disease

## 2019-03-30 ENCOUNTER — Encounter: Payer: Self-pay | Admitting: Medical

## 2019-03-30 ENCOUNTER — Ambulatory Visit: Payer: Self-pay

## 2019-03-30 MED FILL — methIMAzole 5 MG TABS: 5 | 30 days supply | Qty: 120 | Fill #0

## 2019-03-30 NOTE — Telephone Encounter (Signed)
Incoming call from Patient with a complain of right leg tingling .  Onset within last hour.  Carla West states tha it comes and goes.  .  Denies cardiac Sx.  as of now. Reviewed protocol with Pt.  Recommend Pt go to Urgent Care or Ed for further evaluation.  Pt. Voiced understanding       Reason for Disposition . Headache  (and neurologic deficit)  Answer Assessment - Initial Assessment Questions 1. SYMPTOM: "What is the main symptom you are concerned about?" (e.g., weakness, numbness)     *No Answer* 2. ONSET: "When did this start?" (minutes, hours, days; while sleeping)     Last hoiur 3. LAST NORMAL: "When was the last time you were normal (no symptoms)?"     *No Answer* 4. PATTERN "Does this come and go, or has it been constant since it started?"  "Is it present now?"     Comes and goes 5. CARDIAC SYMPTOMS: "Have you had any of the following symptoms: chest pain, difficulty breathing, palpitations?" denies     6. NEUROLOGIC SYMPTOMS: "Have you had any of the following symptoms: headache, dizziness, vision loss, double vision, changes in speech, unsteady on your feet?"     denies 7. OTHER SYMPTOMS: "Do you have any other symptoms?"     *No Answer* 8. PREGNANCY: "Is there any chance you are pregnant?" "When was your last menstrual period?"     na  Protocols used: NEUROLOGIC DEFICIT-A-AH

## 2019-03-30 NOTE — Telephone Encounter (Signed)
New message:    Patient calling stating that she is having busing on right leg and she is also having some numbness. Please call patient.

## 2019-03-30 NOTE — Telephone Encounter (Signed)
Left detailed message on machine and sent mychart message to patient.

## 2019-03-30 NOTE — Telephone Encounter (Signed)
Mychart message sent to MD to advise- notified patient.

## 2019-03-30 NOTE — Telephone Encounter (Signed)
At this hour on friday I would agree. Maybe urgent care would be more appropriate than ED. She also mentioned bruising on my chart message. She needs cbc to evaluate platelet level.  I would recommend [facility]. They should be able to do stat cbc

## 2019-04-02 ENCOUNTER — Telehealth: Payer: Self-pay | Admitting: Cardiovascular Disease

## 2019-04-02 MED ORDER — SALINE SPRAY 0.65 % NA SOLN: 1.0000 | Freq: Two times a day (BID) | NASAL | 6 refills | Status: DC | PRN

## 2019-04-02 NOTE — Telephone Encounter (Signed)
Called and discussed symptoms with Mrs. Carla West. She reports intermittent tingling in her R leg. Seems to have resolved. Not related to ticagrelor or ASA. Also reports nose bleeds. Not constant. Will try nasal saline spray to see if this helps. She has to stay on ASA/brilinta for now. Given strict precautions to present to the ER (e.g., profuse bleeding, light-headedness, dizziness, or any other alarming symptoms). I will see her on Wednesday to re-assess symptoms. She was in agreement with his plan.   Evalina Field, MD

## 2019-04-03 NOTE — Progress Notes (Signed)
Cardiology Office Note:   Date:  04/04/2019  NAME:  Carla West    MRN: [id number] DOB:  01/06/1961   PCP:  Mackie Pai, PA-C  Cardiologist:  Evalina Field, MD  Electrophysiologist:  None   Referring MD: Elise Benne   Chief Complaint  Patient presents with  . Coronary Artery Disease   History of Present Illness:   Carla West is a 58 y.o. female with a hx of non-ST elevation myocardial infarction on 02/27/2019 status post PCI to proximal left circumflex, hypothyroidism on treatment, hypertension who is being seen today for the evaluation of CAD at the request of Sagu[place], Percell Miller, [place].  She was seen in the hospital in mid-September for chest pain elevated troponin, and found to have a tight proximal left circumflex lesion underwent PCI.  She is done fairly well since discharge.  She has been on aspirin and ticagrelor.  She does report some tingling in her legs as well as nosebleeds recently.  I discussed with her over the phone about her nosebleeds and we started a nasal saline spray.  She reports this is helped with her nosebleeds and they have stopped.  She also reports that she still gets numbness and tingling in her right lower extremity.  She also endorses some bruising she is noted on her leg.  The bruising appears small.  She reports she is not bumping into things.  Overall, she reports she is still quite fatigued and short of breath with exertion.  Her methimazole dose is now up to 10 mg twice daily.  Heart rate is much better in the 80s today and she is not losing weight.  Overall, I think this is likely related to her hyperthyroidism and she is getting over her coronavirus infection, as well as myocardial infarction.  I have encouraged her to remain active.  Blood pressure is well controlled.  Most recent LDL was 80, we will recheck cholesterol today.  Past Medical History: Past Medical History:  Diagnosis Date  . Abnormal Pap smear of cervix 08/03/2015  . Benign  essential HTN 08/03/2015  . History of chicken pox   . Migraine headache 08/03/2015    Past Surgical History: Past Surgical History:  Procedure Laterality Date  . CORONARY STENT INTERVENTION N/A 02/27/2019   Procedure: CORONARY STENT INTERVENTION;  Surgeon: Burnell Blanks, MD;  Location: [facility];  Service: Cardiovascular;  Laterality: N/A;  . LEFT HEART CATH AND CORONARY ANGIOGRAPHY N/A 02/27/2019   Procedure: LEFT HEART CATH AND CORONARY ANGIOGRAPHY;  Surgeon: Burnell Blanks, MD;  Location: [facility];  Service: Cardiovascular;  Laterality: N/A;  . TUBAL LIGATION      Current Medications: Current Meds  Medication Sig  . amitriptyline (ELAVIL) 10 MG tablet Take 1 tablet (10 mg total) by mouth at bedtime.  Marland Kitchen amLODipine (NORVASC) 10 MG tablet TAKE 1 TABLET BY MOUTH ONCE DAILY (Patient taking differently: Take 10 mg by mouth daily. )  . aspirin EC 81 MG tablet Take 1 tablet (81 mg total) by mouth daily.  Marland Kitchen atorvastatin (LIPITOR) 80 MG tablet Take 1 tablet (80 mg total) by mouth daily at 6 PM.  . sodium chloride (OCEAN) 0.65 % SOLN nasal spray Place 1 spray into both nostrils 2 (two) times daily as needed for congestion.  . ticagrelor (BRILINTA) 90 MG TABS tablet Take 1 tablet (90 mg total) by mouth 2 (two) times daily.  . traZODone (DESYREL) 50 MG tablet Take 50 mg by mouth at  bedtime.  . [DISCONTINUED] methimazole (TAPAZOLE) 5 MG tablet Take 2 tablets (10 mg total) by mouth 2 (two) times daily.  . [DISCONTINUED] metoprolol tartrate (LOPRESSOR) 100 MG tablet Take 1 tablet (100 mg total) by mouth daily.     Allergies:    Elavil [amitriptyline hcl]   Social History: Social History   Socioeconomic History  . Marital status: Divorced    Spouse name: Not on file  . Number of children: Not on file  . Years of education: Not on file  . Highest education level: Not on file  Occupational History  . Not on file  Social Needs  . Financial resource  strain: Not on file  . Food insecurity    Worry: Not on file    Inability: Not on file  . Transportation needs    Medical: Not on file    Non-medical: Not on file  Tobacco Use  . Smoking status: Never Smoker  . Smokeless tobacco: Never Used  Substance and Sexual Activity  . Alcohol use: No  . Drug use: No  . 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 Physical activity    Days per week: Not on file    Minutes per session: Not on file  . Stress: Not on file  Relationships  . Social Musician on phone: Not on file    Gets together: Not on file    Attends religious service: Not on file    Active member of club or organization: Not on file    Attends meetings of clubs or organizations: Not on file    Relationship status: Not on file  Other Topics Concern  . Not on file  Social History Narrative  . Not on file     Family History: The patient's family history includes Breast cancer in her maternal grandmother; Gout in her brother; Heart disease in her brother and mother; Hyperlipidemia in her father and sister; Hypertension in her brother, brother, and mother; Migraines in her son; Other in her brother.  ROS:   All other ROS reviewed and negative. Pertinent positives noted in the HPI.     EKGs/Labs/Other Studies Reviewed:   The following studies were personally reviewed by me today:  TTE 02/27/2019  1. The left ventricle has normal systolic function, with an ejection fraction of 60-65%. The cavity size was normal. There is mildly increased left ventricular wall thickness. No evidence of left ventricular regional wall motion abnormalities.  2. The right ventricle has normal systolc function. The cavity was normal. There is no increase in right ventricular wall thickness.  3. Left atrial size was moderately dilated.  4. The mitral valve is abnormal. Mild thickening of the mitral valve  leaflet. Mitral valve regurgitation is mild to moderate by color flow Doppler.  5. The aortic valve is tricuspid Mild sclerosis of the aortic valve.  6. The ascending aorta and aortic root are normal in size and structure.  7. The inferior vena cava was normal in size with <50% respiratory variability.   LHC 02/27/2019  Prox RCA lesion is 30% stenosed.  Prox Cx lesion is 99% stenosed.  Mid LAD lesion is 20% stenosed.  2nd Mrg lesion is 40% stenosed.  A drug-eluting stent was successfully placed using a STENT RESOLUTE ONYX 3.5X12.  Post intervention, there is a 0% residual stenosis.   1. Mild non-obstructive disease in the LAD and RCA 2.  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  3. Successful PTCA/DES x 1 proximal Circumflex   Recent Labs: 01/23/2019: Pro B Natriuretic peptide (BNP) 152.0 02/27/2019: B Natriuretic Peptide 624.9; TSH <0.010 02/28/2019: Magnesium 1.7 03/14/2019: ALT 43; BUN 15; Creatinine, Ser 0.62; Hemoglobin 11.2; Platelets 318.0; Potassium 4.1; Sodium 141   Recent Lipid Panel    Component Value Date/Time   CHOL 153 02/27/2019 0500   TRIG 47 02/27/2019 0500   HDL 64 02/27/2019 0500   CHOLHDL 2.4 02/27/2019 0500   VLDL 9 02/27/2019 0500   LDLCALC 80 02/27/2019 0500    Physical Exam:   VS:  BP 128/80   Pulse 88   Temp 98.1 F (36.7 C)   Ht 5\' 7"  (1.702 m)   Wt 138 lb 9.6 oz (62.9 kg)   SpO2 90%   BMI 21.71 kg/m    Wt Readings from Last 3 Encounters:  04/04/19 138 lb 9.6 oz (62.9 kg)  03/15/19 137 lb 6.4 oz (62.3 kg)  03/14/19 136 lb 6.4 oz (61.9 kg)    General: Well nourished, well developed, in no acute distress Heart: Atraumatic, normal size  Eyes: PEERLA, EOMI  Neck: Supple, no JVD Endocrine: No thryomegaly Cardiac: Normal S1, S2; RRR; no murmurs, rubs, or gallops Lungs: Clear to auscultation bilaterally, no wheezing, rhonchi or rales  Abd: Soft, nontender, no  hepatomegaly  Ext: No edema, pulses 2+ Musculoskeletal: No deformities, BUE and BLE strength normal and equal Skin: Warm and dry, no rashes   Neuro: Alert and oriented to person, place, time, and situation, CNII-XII grossly intact, no focal deficits  Psych: Normal mood and affect   ASSESSMENT:   Acquanetta Cabanilla is a 58 y.o. female who presents for the following: 1. Coronary artery disease involving native coronary artery of native heart without angina pectoris   2. Essential hypertension   3. Mixed hyperlipidemia   4. Hyperthyroidism     PLAN:   1. Coronary artery disease involving native coronary artery of native heart without angina pectoris -Non-STEMI on 02/27/2019 with PCI to proximal left circumflex.  Noted to have nonobstructive CAD in other arteries. -No symptoms of angina today.  I suspect her shortness of breath and fatigue is related to hyperthyroidism recent infection and overall several medical issues going on.  She also has had a recent death in the family with the death of her mother. -We will switch her metoprolol to succinate formulation 100 mg daily -We will continue aspirin 81 and Brilinta 90 mg twice daily.  She is having minor bruising on exam, and I think some of this is expected.  There are no major bleeding issues noted.  The nosebleeds have improved and we will just keep an eye on them. -We will continue her Lipitor and recheck a lipid profile today, LDL goal less than 70 -Echocardiogram with normal LV function -Overall I am pleased with how she is doing from a coronary standpoint  2. Essential hypertension -We will change metoprolol to 100 mg succinate once daily  3. Mixed hyperlipidemia -Recheck lipid profile today, goal less than 70  4. Hyperthyroidism -We will refill her methimazole 10 mg twice daily and she will see her endocrinologist in November.  I will look into getting her to see a different one in the near future when this person obtains her medical  license in [place]  Disposition: Return in about 2 months (around 06/04/2019).  Medication Adjustments/Labs and Tests Ordered:  Current medicines are reviewed at length with the patient today.  Concerns regarding medicines are outlined above.  Orders Placed This Encounter  Procedures  . Lipid panel   Meds ordered this encounter  Medications  . methimazole (TAPAZOLE) 10 MG tablet    Sig: Take 1 tablet (10 mg total) by mouth 2 (two) times daily.    Dispense:  90 tablet    Refill:  2  . metoprolol succinate (TOPROL-XL) 100 MG 24 hr tablet    Sig: Take 1 tablet (100 mg total) by mouth daily. Take with or immediately following a meal.    Dispense:  90 tablet    Refill:  3    Patient Instructions  Medication Instructions:  STOP TAKING METOPROLOL TARTRATE 100 MG  START TAKING METOPROLOL SUCCINATE 100 MG DAILY    REFILLED  THYROID MEDICATION  *If you need a refill on your cardiac medications before your next appointment, please call your pharmacy*  Lab Work: LIPIDS - TODAY  If you have labs (blood work) drawn today and your tests are completely normal, you will receive your results only by: Marland Kitchen. MyChart Message (if you have MyChart) OR . A paper copy in the mail If you have any lab test that is abnormal or we need to change your treatment, we will call you to review the results.  Testing/Procedures: NOT NEEDED  Follow-Up: At [facility], you and your health needs are our priority.  As part of our continuing mission to provide you with exceptional heart care, we have created designated Provider Care Teams.  These Care Teams include your primary Cardiologist (physician) and Advanced Practice Providers (APPs -  Physician Assistants and Nurse Practitioners) who all work together to provide you with the care you need, when you need it.  Your next appointment:    2 MONTHS   The format for your next appointment:   In Person  Provider:   Lennie OdorWesley O'Neal, MD  Other Instructions      Signed, Lenna GilfordWesley T. Flora Lipps'Neal, MD [facility]  [facility]  [address], [address] [place], [place] [address] [phone]  04/04/2019 10:05 AM

## 2019-04-04 ENCOUNTER — Encounter: Payer: Self-pay | Admitting: Cardiovascular Disease

## 2019-04-04 ENCOUNTER — Ambulatory Visit (INDEPENDENT_AMBULATORY_CARE_PROVIDER_SITE_OTHER): Payer: Managed Care, Other (non HMO) | Admitting: Cardiovascular Disease

## 2019-04-04 ENCOUNTER — Other Ambulatory Visit: Payer: Self-pay

## 2019-04-04 ENCOUNTER — Telehealth (HOSPITAL_COMMUNITY): Payer: Self-pay

## 2019-04-04 VITALS — BP 128/80 | HR 88 | Temp 98.1°F | Ht 67.0 in | Wt 138.6 lb

## 2019-04-04 DIAGNOSIS — E782 Mixed hyperlipidemia: Secondary | ICD-10-CM | POA: Diagnosis not present

## 2019-04-04 DIAGNOSIS — I10 Essential (primary) hypertension: Secondary | ICD-10-CM | POA: Diagnosis not present

## 2019-04-04 DIAGNOSIS — E0590 Thyrotoxicosis, unspecified without thyrotoxic crisis or storm: Secondary | ICD-10-CM | POA: Diagnosis not present

## 2019-04-04 DIAGNOSIS — I2510 Atherosclerotic heart disease of native coronary artery without angina pectoris: Secondary | ICD-10-CM | POA: Diagnosis not present

## 2019-04-04 LAB — LIPID PANEL
Chol/HDL Ratio: 1.9 ratio (ref 0.0–4.4)
Cholesterol, Total: 154 mg/dL (ref 100–199)
HDL: 83 mg/dL (ref >39)
LDL Chol Calc (NIH): 60 mg/dL (ref 0–99)
Triglycerides: 49 mg/dL (ref 0–149)
VLDL Cholesterol Cal: 11 mg/dL (ref 5–40)

## 2019-04-04 MED ORDER — METOPROLOL SUCCINATE ER 100 MG PO TB24: 100.0000 mg | ORAL_TABLET | Freq: Every day | ORAL | 3 refills | Status: DC

## 2019-04-04 MED ORDER — METHIMAZOLE 10 MG PO TABS: 10.0000 mg | ORAL_TABLET | Freq: Two times a day (BID) | ORAL | 2 refills | Status: DC

## 2019-04-04 NOTE — Patient Instructions (Signed)
Medication Instructions:  STOP TAKING METOPROLOL TARTRATE 100 MG  START TAKING METOPROLOL SUCCINATE 100 MG DAILY    REFILLED  THYROID MEDICATION  *If you need a refill on your cardiac medications before your next appointment, please call your pharmacy*  Lab Work: [place]  If you have labs (blood work) drawn today and your tests are completely normal, you will receive your results only by: Marland Kitchen MyChart Message (if you have MyChart) OR . A paper copy in the mail If you have any lab test that is abnormal or we need to change your treatment, we will call you to review the results.  Testing/Procedures: NOT NEEDED  Follow-Up: At [facility], you and your health needs are our priority.  As part of our continuing mission to provide you with exceptional heart care, we have created designated Provider Care Teams.  These Care Teams include your primary Cardiologist (physician) and Advanced Practice Providers (APPs -  Physician Assistants and Nurse Practitioners) who all work together to provide you with the care you need, when you need it.  Your next appointment:    2 MONTHS   The format for your next appointment:   In Person  Provider:   Eleonore Chiquito, MD  Other Instructions

## 2019-04-04 NOTE — Telephone Encounter (Signed)
No response from pt regarding CR.  Closed referral.  

## 2019-04-06 ENCOUNTER — Ambulatory Visit: Payer: Managed Care, Other (non HMO) | Admitting: Internal Medicine

## 2019-04-06 ENCOUNTER — Encounter: Payer: Self-pay | Admitting: Internal Medicine

## 2019-04-06 ENCOUNTER — Other Ambulatory Visit: Payer: Self-pay

## 2019-04-06 VITALS — BP 112/70 | HR 87 | Ht 67.0 in | Wt 140.0 lb

## 2019-04-06 DIAGNOSIS — E0590 Thyrotoxicosis, unspecified without thyrotoxic crisis or storm: Secondary | ICD-10-CM

## 2019-04-06 DIAGNOSIS — E0500 Thyrotoxicosis with diffuse goiter without thyrotoxic crisis or storm: Secondary | ICD-10-CM | POA: Diagnosis not present

## 2019-04-06 LAB — T3, FREE: T3, Free: 20.7 pg/mL — ABNORMAL HIGH (ref 2.3–4.2)

## 2019-04-06 LAB — TSH: TSH: <0.01 u[IU]/mL — ABNORMAL LOW (ref 0.35–4.50)

## 2019-04-06 LAB — T4, FREE: Free T4: 4.68 ng/dL — ABNORMAL HIGH (ref 0.60–1.60)

## 2019-04-06 MED ORDER — METHIMAZOLE 10 MG PO TABS: ORAL_TABLET | ORAL | 2 refills | Status: DC

## 2019-04-06 NOTE — Progress Notes (Signed)
Patient ID: Carla West, female   DOB: 04-10-1961, 58 y.o.   MRN: [id number]    HPI  Carla West is a 58 y.o.-year-old female, initially referred by her PCP, Melina SchoolsEdward Saquier, PA-C, returning for follow-up for thyrotoxicosis, diagnosed as Graves' disease after last visit.  Last visit 1 month ago.  Patient was seen in consultation for thyrotoxicosis on 02/26/2019.  At that time we checked her TFTs and Graves' antibodies.  Before the labs returned, patient was admitted to the hospital the same day with NSTEMI.  She had cardiac catheterization with diagnosis of CAD and had PCI with DES x1.  While in the hospital, I discussed with the admitting physician and we started methimazole 5 mg daily.  Afterwards I sent her a message to increase the dose to 10 mg twice a day.  However, she misunderstood the instructions that she only takes it daily.  Reviewed and addended history: She had Covid19 in 08/2018 - fatigue, palpitation, diarrhea, 15 lbs weight loss. She continues to have diarrhea and fatigue.  Patient was found to have abnormal TFTs in 11/2017.  TSH remains suppressed but free thyroid hormones were normal until 01/2020 free T4 returned elevated.  She was referred to endocrinology.  She is on metoprolol 100 mg daily.  I reviewed her TFTs: Lab Results  Component Value Date   TSH <0.010 (L) 02/27/2019   TSH <0.01 (L) 02/26/2019   TSH <0.01 (L) 01/23/2019   TSH <0.01 (L) 04/11/2018   TSH <0.01 (L) 02/28/2018   TSH <0.01 (L) 12/06/2017   TSH 0.54 08/06/2016   FREET4 5.34 (H) 02/27/2019   FREET4 4.84 (H) 02/26/2019   FREET4 4.77 (H) 01/24/2019   FREET4 1.07 04/11/2018   FREET4 1.17 02/28/2018   FREET4 1.46 12/06/2017   T3FREE 18.8 (H) 02/26/2019   T3FREE 3.5 02/28/2018   Her antithyroid antibodies were elevated, pointing towards Graves' disease: Lab Results  Component Value Date   TSI 467 (H) 02/26/2019   At last visit, she mentioned: - + fatigue - + excessive sweating/heat  intolerance- postmenopausal - + tremors - + anxiety - + palpitations-getting worse - + hyperdefecation - + weight loss- 15 pounds around the time of her COVID-19 diagnosis at the beginning of the year - + insomnia - + hair loss - + SOB with exertion  She is a much better now, but she still has fatigue (not sleeping well), and still has diarrhea every time she eats something.  Pt denies: - feeling nodules in neck - hoarseness - dysphagia - choking - SOB with lying down  Pt does have a FH of thyroid ds. In MGF. No FH of thyroid cancer. No h/o radiation tx to head or neck.  No herbal supplements. No Biotin use. No recent steroids use.   Pt. also has a history of migraines.  ROS: Constitutional: no weight gain/no weight loss,no subjective hyperthermia, no subjective hypothermia, + please see HPI Eyes: no blurry vision, no xerophthalmia ENT: no sore throat, + see HPI Cardiovascular: no CP/no SOB/no palpitations/no leg swelling Respiratory: no cough/no SOB/no wheezing Gastrointestinal: no N/no V/no D/no C/no acid reflux Musculoskeletal: no muscle aches/no joint aches Skin: no rashes, no hair loss Neurological: + Tremors/no numbness/no tingling/no dizziness  I reviewed pt's medications, allergies, PMH, social hx, family hx, and changes were documented in the history of present illness. Otherwise, unchanged from my initial visit note.  Past Medical History:  Diagnosis Date  . Abnormal Pap smear of cervix 08/03/2015  . Benign essential HTN  08/03/2015  . History of chicken pox   . Migraine headache 08/03/2015   Past Surgical History:  Procedure Laterality Date  . CORONARY STENT INTERVENTION N/A 02/27/2019   Procedure: CORONARY STENT INTERVENTION;  Surgeon: Kathleene Hazel, MD;  Location: [facility];  Service: Cardiovascular;  Laterality: N/A;  . LEFT HEART CATH AND CORONARY ANGIOGRAPHY N/A 02/27/2019   Procedure: LEFT HEART CATH AND CORONARY ANGIOGRAPHY;  Surgeon:  Kathleene Hazel, MD;  Location: [facility];  Service: Cardiovascular;  Laterality: N/A;  . TUBAL LIGATION     Social History   Socioeconomic History  . Marital status: Divorced    Spouse name: Not on file  . Number of children: Not on file  . Years of education: Not on file  . Highest education level: Not on file  Occupational History  . Not on file  Social Needs  . Financial resource strain: Not on file  . Food insecurity    Worry: Not on file    Inability: Not on file  . Transportation needs    Medical: Not on file    Non-medical: Not on file  Tobacco Use  . Smoking status: Never Smoker  . Smokeless tobacco: Never Used  Substance and Sexual Activity  . Alcohol use: No  . Drug use: No  . 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 Physical activity    Days per week: Not on file    Minutes per session: Not on file  . Stress: Not on file  Relationships  . Social Musician on phone: Not on file    Gets together: Not on file    Attends religious service: Not on file    Active member of club or organization: Not on file    Attends meetings of clubs or organizations: Not on file    Relationship status: Not on file  . Intimate partner violence    Fear of current or ex partner: Not on file    Emotionally abused: Not on file    Physically abused: Not on file    Forced sexual activity: Not on file  Other Topics Concern  . Not on file  Social History Narrative  . Not on file   Current Outpatient Medications on File Prior to Visit  Medication Sig Dispense Refill  . amitriptyline (ELAVIL) 10 MG tablet Take 1 tablet (10 mg total) by mouth at bedtime. 30 tablet 0  . amLODipine (NORVASC) 10 MG tablet TAKE 1 TABLET BY MOUTH ONCE DAILY (Patient taking differently: Take 10 mg by mouth daily. ) 30 tablet 5  . aspirin EC 81 MG tablet Take 1 tablet (81 mg total) by mouth  daily. 90 tablet 3  . atorvastatin (LIPITOR) 80 MG tablet Take 1 tablet (80 mg total) by mouth daily at 6 PM. 30 tablet 0  . methimazole (TAPAZOLE) 10 MG tablet Take 1 tablet (10 mg total) by mouth 2 (two) times daily. 90 tablet 2  . metoprolol succinate (TOPROL-XL) 100 MG 24 hr tablet Take 1 tablet (100 mg total) by mouth daily. Take with or immediately following a meal. 90 tablet 3  . sodium chloride (OCEAN) 0.65 % SOLN nasal spray Place 1 spray into both nostrils 2 (two) times daily as needed for congestion. 30 mL 6  . ticagrelor (BRILINTA) 90 MG TABS tablet Take 1 tablet (90 mg total) by mouth 2 (two)  times daily. 180 tablet 1  . traZODone (DESYREL) 50 MG tablet Take 50 mg by mouth at bedtime.     No current facility-administered medications on file prior to visit.    Allergies  Allergen Reactions  . Elavil [Amitriptyline Hcl] Other (See Comments)    Jittery    Family History  Problem Relation Age of Onset  . Hypertension Mother   . Heart disease Mother   . Hyperlipidemia Father   . Breast cancer Maternal Grandmother   . Hyperlipidemia Sister   . Other Brother        plan crash in [employer]  . Migraines Son   . Heart disease Brother        CHF, arrythmia  . Hypertension Brother   . Gout Brother   . Hypertension Brother     PE: BP 112/70   Pulse 87   Ht 5\' 7"  (1.702 m)   Wt 140 lb (63.5 kg)   SpO2 98%   BMI 21.93 kg/m  Wt Readings from Last 3 Encounters:  04/06/19 140 lb (63.5 kg)  04/04/19 138 lb 9.6 oz (62.9 kg)  03/15/19 137 lb 6.4 oz (62.3 kg)   Constitutional:  Normal weight, in NAD, appears restless Eyes: PERRLA, EOMI, no exophthalmos ENT: moist mucous membranes, + mild symmetric thyromegaly, no thyroid bruits, no cervical lymphadenopathy Cardiovascular: RRR, No MRG, no lower extremity edema Respiratory: CTA B Gastrointestinal: abdomen soft, NT, ND, BS+ Musculoskeletal: no deformities, strength intact in all 4 Skin: moist, warm, no rashes Neurological: +  tremor with outstretched hands, DTR normal in all 4  ASSESSMENT: 1.  Graves' disease  PLAN:  1. Patient with a history of undetectable TSH since 11/2017, with diagnosis of clinical, symptomatic, thyrotoxicosis at last visit when she presented with weight loss, heat intolerance, hyper defecation, tremors, palpitations, anxiety.  The day that I saw her for the initial visit she was actually admitted to the hospital with an NSTEMI.  Cardiac catheterization showed CAD and she had a stent placed. -At last visit we started methimazole and this was increased after her hospitalization.  However, she is only taking 10 mg of methimazole daily now, which I suspect is not enough.  She is feeling better, with improved anxiety and shortness of breath and her weight loss has stopped.  However, she still has tremors, appears jittery, has diarrhea, and cannot sleep at night -At this visit, we reviewed her previous tests and also discussed about further treatment for her Graves' disease in case methimazole will prove insufficient.  She agrees with RAI treatment if needed. -We will check TFTs today and adjust the methimazole dose if needed. -She continues metoprolol 100 mg daily.  She denies palpitations now.  Heart rate is normal. -No signs of Graves' ophthalmopathy; she does not have any double vision, blurry vision, eye pain, chemosis. -I will see her back in 3 months.  However, most likely she will need to return sooner for labs.  - time spent with the patient: 25 minutes, of which >50% was spent in obtaining information about her symptoms, reviewing her previous labs, evaluations, and treatments, counseling her about her condition (please see the discussed topics above), and developing a plan to further investigate and treat it; she had a number of questions which I addressed.  Office Visit on 04/06/2019  Component Date Value Ref Range Status  . T3, Free 04/06/2019 20.7* 2.3 - 4.2 pg/mL Final  . Free T4  04/06/2019 4.68* 0.60 - 1.60 ng/dL Final  Comment: Specimens from patients who are undergoing biotin therapy and /or ingesting biotin supplements may contain high levels of biotin.  The higher biotin concentration in these specimens interferes with this Free T4 assay.  Specimens that contain high levels  of biotin may cause false high results for this Free T4 assay.  Please interpret results in light of the total clinical presentation of the patient.    Marland Kitchen TSH 04/06/2019 <0.01* 0.35 - 4.50 uIU/mL Final   Message sent to patient regarding the above labs: Dear Ms. Quentin Cornwall, I just got the results back from your thyroid tests and they are not much improved from before!  Let's increase the methimazole to 10 mg in a.m. and 20 mg in p.m.  (Higher dose at night to hopefully help you sleep).  We will then need to repeat the tests again in 1 month.   Please let me know if you need a refill for the methimazole. Sincerely, Philemon Kingdom MD   Philemon Kingdom, MD PhD [facility]

## 2019-04-06 NOTE — Patient Instructions (Addendum)
Please move Metoprolol 100 mg to evening.  Continue Methimazole 10 mg daily.  Please stop at the lab.  Please come back for a follow-up appointment in 3 months.

## 2019-04-10 ENCOUNTER — Ambulatory Visit: Payer: Managed Care, Other (non HMO) | Admitting: Cardiovascular Disease

## 2019-04-23 ENCOUNTER — Telehealth: Payer: Self-pay | Admitting: Medical

## 2019-04-23 DIAGNOSIS — I10 Essential (primary) hypertension: Secondary | ICD-10-CM

## 2019-04-23 MED ORDER — AMLODIPINE BESYLATE 10 MG PO TABS: 10.0000 mg | ORAL_TABLET | Freq: Every day | ORAL | 5 refills | Status: DC

## 2019-04-23 NOTE — Telephone Encounter (Signed)
RX REFILL traZODone (DESYREL) 50 MG tablet  amLODipine (NORVASC) 10 MG tablet  amitriptyline (ELAVIL) 10 MG tablet  atorvastatin (LIPITOR) 80 MG tablet  metoprolol succinate (TOPROL-XL) 100 MG 24 hr tablet  PHARMACY [facility]  [address] [place], [place] [address]

## 2019-04-23 NOTE — Telephone Encounter (Signed)
I only see that you filed Amitriptyline. Okay to fill other medication?

## 2019-04-24 ENCOUNTER — Telehealth: Payer: Self-pay | Admitting: Medical

## 2019-04-24 MED ORDER — AMLODIPINE BESYLATE 10 MG PO TABS: 10.0000 mg | ORAL_TABLET | Freq: Every day | ORAL | 5 refills | Status: DC

## 2019-04-24 MED ORDER — ATORVASTATIN CALCIUM 80 MG PO TABS: 80.0000 mg | ORAL_TABLET | Freq: Every day | ORAL | 3 refills | Status: DC

## 2019-04-24 MED ORDER — AMITRIPTYLINE HCL 10 MG PO TABS: 10.0000 mg | ORAL_TABLET | Freq: Every day | ORAL | 0 refills | Status: DC

## 2019-04-24 NOTE — Telephone Encounter (Signed)
I refilled her lipitor. Fully aware that she had heart attack. Sent in 90 tab rx with 3 refills.  Regarding elavil. Can prescribe refill but want to clarify she feels like having no side effect with this. Under allergy list elavil is on and at one point she reported felt jittery per epic review. Can help with insomnia and nerve pain. Also can help mood. Need clarification that she thinks no side effects  Note jittery sensation can occur with hyperthyroidism which she has/had and is being treated but need clarification before refilling.

## 2019-04-24 NOTE — Addendum Note (Signed)
Addended by: Hinton Dyer on: 04/24/2019 03:32 PM   Modules accepted: Orders

## 2019-04-24 NOTE — Telephone Encounter (Signed)
Pt states the pharmacy did not get the  amitriptyline (ELAVIL) 10 MG tablet atorvastatin (LIPITOR) 80 MG tablet  Pt wants you to know she had a heart attack last month so does not want to be messing around with her meds.   [facility] [place], [place] AT [address] [phone] (Phone) [phone] (Fax

## 2019-04-24 NOTE — Telephone Encounter (Signed)
Pt states she is not having and side effects to Elavil

## 2019-04-24 NOTE — Addendum Note (Signed)
Addended by: Hinton Dyer on: 04/24/2019 03:36 PM   Modules accepted: Orders

## 2019-04-24 NOTE — Addendum Note (Signed)
Addended by: Hinton Dyer on: 04/24/2019 03:34 PM   Modules accepted: Orders

## 2019-04-24 NOTE — Telephone Encounter (Signed)
Refilled lipitor 

## 2019-04-30 ENCOUNTER — Encounter: Payer: Self-pay | Admitting: Gastroenterology

## 2019-04-30 ENCOUNTER — Ambulatory Visit (INDEPENDENT_AMBULATORY_CARE_PROVIDER_SITE_OTHER): Payer: Managed Care, Other (non HMO) | Admitting: Gastroenterology

## 2019-04-30 ENCOUNTER — Telehealth: Payer: Self-pay | Admitting: Emergency Medicine

## 2019-04-30 ENCOUNTER — Encounter: Payer: Self-pay | Admitting: Emergency Medicine

## 2019-04-30 VITALS — BP 120/80 | HR 61 | Temp 98.3°F | Ht 67.0 in | Wt 141.0 lb

## 2019-04-30 DIAGNOSIS — R143 Flatulence: Secondary | ICD-10-CM | POA: Diagnosis not present

## 2019-04-30 DIAGNOSIS — K529 Noninfective gastroenteritis and colitis, unspecified: Secondary | ICD-10-CM

## 2019-04-30 DIAGNOSIS — Z1159 Encounter for screening for other viral diseases: Secondary | ICD-10-CM

## 2019-04-30 DIAGNOSIS — Z1211 Encounter for screening for malignant neoplasm of colon: Secondary | ICD-10-CM

## 2019-04-30 MED ORDER — DICYCLOMINE HCL 20 MG PO TABS: 20.0000 mg | ORAL_TABLET | Freq: Three times a day (TID) | ORAL | 3 refills | Status: DC

## 2019-04-30 MED ORDER — NA SULFATE-K SULFATE-MG SULF 17.5-3.13-1.6 GM/177ML PO SOLN: 1.0000 | ORAL | 0 refills | Status: AC

## 2019-04-30 NOTE — Telephone Encounter (Signed)
   Primary Cardiologist: Evalina Field, MD  Chart reviewed as part of pre-operative protocol coverage. Carla West was last seen on 04/04/2019 by Dr. Audie Box.  She was doing quite well from a cardiac perspective however, she has a recent history of non-STEMI MI s/p PCI to proximal left circumflex artery in which she has been on DAPT with ASA and Brilinta therapy.  Given recent stent placement, she will need to stay on uninterrupted DAPT for approximately 1 year.  If colonoscopy is deemed urgent or emergent, please send preoperative request back to our office for further review otherwise would prefer continuation of DAPT for full 1 year prior to interruption.  Pre-op covering staff: - Please contact requesting surgeon's office via preferred method (i.e, phone, fax) to inform them of the need to continue DAPT until 02/2020  Kathyrn Drown, NP 04/30/2019, 4:20 PM

## 2019-04-30 NOTE — Telephone Encounter (Signed)
I s/w [facility] and went over there recommendations in regards to her Brilinta. See notes from Kathyrn Drown, NP. I will remove from the pre op call back pool. I will fax notes to GI.

## 2019-04-30 NOTE — Progress Notes (Signed)
Referring Provider: [name], PA-C Primary Care Physician:  [name], PA-C  Reason for Consultation: Trips to the bathroom and gas  IMPRESSION:  Recent GI symptoms worsened following any STEMI - flatus and post-prandial diarrhea No prior colon cancer screening Recent non-ST elevation MI on Brilinta Family history of colon polyps (mom)  Etiology of recent GI symptoms is unclear.  It is likely exacerbated by coronary artery disease and recent thyroid toxicosis.  I am hopeful that as her thyroid stabilizes her GI symptoms may improve.  In the meantime of asked her to add a daily dose of psyllium for stool bulking and to improve consistency.  We will use dicyclomine to minimize her postprandial symptoms.  She may also be a good candidate for probiotics.  I have recommended a colonoscopy for further evaluation as well as for colon cancer screening.  However, with her MI in September she is not a candidate for endoscopy for at least 3 to 6 months.  In addition, I would be surprised if we can hold her Brilinta for endoscopic evaluation within the next 12 months.  We will clarify timing of endoscopy with Dr. Flora Lipps, who prescribes her Brilinta.    Addendum: Dr. Flora Lipps reports the Brilinta cannot be held for 1 year.  We will focus on symptomatic management and proceed with endoscopy at that time.    PLAN: Metamucil for daily stool bulking agent Trial of Dicyclomine 20 mg QID Consider probiotics in the future if symptoms persist Colonoscopy after a Brilinta washout  Will proceed with endoscopy. Given comorbidities and Brilinta the procedure is high risk. The nature of the procedure, as well as the risks, benefits, and alternatives were carefully and thoroughly reviewed with the patient. Ample time for discussion and questions allowed. The patient understood, was satisfied, and agreed to proceed.  Please see the "Patient Instructions" section for addition details about the plan.   HPI: Carla West is a 58 y.o. female  [facility] Admissions Director referred by PA Estanislado Emms for further evaluation of colon cancer screening.  The history is obtained through the patient and review of her electronic health record.  She has a history of hypertension, migraines, thyroid toxicosis, and coronary artery disease/NSTEMI 02/27/19 with PCI with DES x 1 on Brilinta.  Reporting diffuse abdominal pain, flatus and diarrhea with any po intake.  Symptoms worsened immediately prior to the NSTEMI. No change with food prepared at home. Not triggered by water, but any other foods or liquids cause trouble.  No identified specific food triggers. Appetite is good but weight is down following her NSTEMI. Using Imodium but she finds that its actually too much No other associated symptoms. No identified exacerbating or relieving features.   Has not tried any other medications or lifestyle changes to minimize her symptoms.  No prior abdominal imaging.  No prior colonoscopy or colon cancer screening.   Mother with precancerous polyps. No other known family history of colon cancer or polyps. No family history of uterine/endometrial cancer, pancreatic cancer or gastric/stomach cancer.   Past Medical History:  Diagnosis Date  . Abnormal Pap smear of cervix 08/03/2015  . Benign essential HTN 08/03/2015  . History of chicken pox   . Migraine headache 08/03/2015    Past Surgical History:  Procedure Laterality Date  . CORONARY STENT INTERVENTION N/A 02/27/2019   Procedure: CORONARY STENT INTERVENTION;  Surgeon: Kathleene Hazel, MD;  Location: [facility];  Service: Cardiovascular;  Laterality: N/A;  . LEFT HEART  CATH AND CORONARY ANGIOGRAPHY N/A 02/27/2019   Procedure: LEFT HEART CATH AND CORONARY ANGIOGRAPHY;  Surgeon: Kathleene HazelMcAlhany, Christopher D, MD;  Location: [facility];  Service: Cardiovascular;  Laterality: N/A;  . TUBAL LIGATION      Current Outpatient Medications   Medication Sig Dispense Refill  . amitriptyline (ELAVIL) 10 MG tablet Take 1 tablet (10 mg total) by mouth at bedtime. 30 tablet 0  . amLODipine (NORVASC) 10 MG tablet Take 1 tablet (10 mg total) by mouth daily. 30 tablet 5  . aspirin EC 81 MG tablet Take 1 tablet (81 mg total) by mouth daily. 90 tablet 3  . atorvastatin (LIPITOR) 80 MG tablet Take 1 tablet (80 mg total) by mouth daily at 6 PM. 90 tablet 3  . methimazole (TAPAZOLE) 10 MG tablet Take 10 mg in a.m. and 20 mg in the evening, with meals 90 tablet 2  . metoprolol succinate (TOPROL-XL) 100 MG 24 hr tablet Take 1 tablet (100 mg total) by mouth daily. Take with or immediately following a meal. 90 tablet 3  . sodium chloride (OCEAN) 0.65 % SOLN nasal spray Place 1 spray into both nostrils 2 (two) times daily as needed for congestion. 30 mL 6  . ticagrelor (BRILINTA) 90 MG TABS tablet Take 1 tablet (90 mg total) by mouth 2 (two) times daily. 180 tablet 1  . traZODone (DESYREL) 50 MG tablet Take 50 mg by mouth at bedtime.     No current facility-administered medications for this visit.     Allergies as of 04/30/2019 - Review Complete 04/06/2019  Allergen Reaction Noted  . Elavil [amitriptyline hcl] Other (See Comments) 04/02/2016    Family History  Problem Relation Age of Onset  . Hypertension Mother   . Heart disease Mother   . Hyperlipidemia Father   . Breast cancer Maternal Grandmother   . Hyperlipidemia Sister   . Other Brother        plan crash in [employer]  . Migraines Son   . Heart disease Brother        CHF, arrythmia  . Hypertension Brother   . Gout Brother   . Hypertension Brother     Social History   Socioeconomic History  . Marital status: Divorced    Spouse name: Not on file  . Number of children: Not on file  . Years of education: Not on file  . Highest education level: Not on file  Occupational History  . Not on file  Social Needs  . Financial resource strain: Not on file  . Food insecurity     Worry: Not on file    Inability: Not on file  . Transportation needs    Medical: Not on file    Non-medical: Not on file  Tobacco Use  . Smoking status: Never Smoker  . Smokeless tobacco: Never Used  Substance and Sexual Activity  . Alcohol use: No  . Drug use: No  . 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 Physical activity    Days per week: Not on file    Minutes per session: Not on file  . Stress: Not on file  Relationships  . Social Musicianconnections    Talks on phone: Not on file    Gets together: Not on file    Attends religious service: Not on file    Active member of club or organization: Not on file  Attends meetings of clubs or organizations: Not on file    Relationship status: Not on file  . Intimate partner violence    Fear of current or ex partner: Not on file    Emotionally abused: Not on file    Physically abused: Not on file    Forced sexual activity: Not on file  Other Topics Concern  . Not on file  Social History Narrative  . Not on file    Review of Systems: 12 system ROS is negative except as noted above with the additions of anxiety, vision change, fatigue, headaches, night sweats, nosebleeds, and insomnia.   Physical Exam: General:   Alert,  well-nourished, pleasant and cooperative in NAD Head:  Normocephalic and atraumatic. Eyes:  Sclera clear, no icterus.   Conjunctiva pink. Ears:  Normal auditory acuity. Nose:  No deformity, discharge,  or lesions. Mouth:  No deformity or lesions.   Neck:  Supple; no masses or thyromegaly. Lungs:  Clear throughout to auscultation.   No wheezes. Heart:  Regular rate and rhythm; no murmurs. Abdomen:  Soft,nontender, nondistended, normal bowel sounds, no rebound or guarding. No hepatosplenomegaly.   Rectal:  Deferred  Msk:  Symmetrical. No boney deformities LAD: No inguinal or umbilical LAD Extremities:  No clubbing or edema.  Neurologic:  Alert and  oriented x4;  grossly nonfocal Skin:  Intact without significant lesions or rashes. Psych:  Alert and cooperative. Normal mood and affect.    Andalyn Heckstall L. Tarri Glenn, MD, MPH 04/30/2019, 3:17 PM

## 2019-04-30 NOTE — Telephone Encounter (Signed)
[facility] Pre-operative Risk Assessment     Request for surgical clearance:     Endoscopy Procedure  What type of surgery is being performed?     colonoscopy  When is this surgery scheduled?     06-06-2019  What type of clearance is required ?   Pharmacy  Are there any medications that need to be held prior to surgery and how long? Brillinta 5 days  Practice name and name of physician performing surgery?      [facility]  What is your office phone and fax number?      Phone- [phone]  [phone]  Anesthesia type (None, local, MAC, general) ?       MAC

## 2019-04-30 NOTE — Patient Instructions (Addendum)
You have been scheduled for a colonoscopy. Please follow written instructions given to you at your visit today.  Please pick up your prep supplies at the pharmacy within the next 1-3 days. If you use inhalers (even only as needed), please bring them with you on the day of your procedure.  You will be contacted by our office prior to your procedure for directions on holding your Brillinta.  If you do not hear from our office 1 week prior to your scheduled procedure, please call [phone] to discuss.   We have sent the following medications to your pharmacy for you to pick up at your convenience:  Dicyclomine 20 mg four times a day as needed  Use Metamucil once daily.

## 2019-05-01 ENCOUNTER — Encounter: Payer: Self-pay | Admitting: Emergency Medicine

## 2019-05-03 ENCOUNTER — Ambulatory Visit: Payer: Managed Care, Other (non HMO)

## 2019-05-28 ENCOUNTER — Ambulatory Visit: Payer: Managed Care, Other (non HMO) | Admitting: Internal Medicine

## 2019-05-29 ENCOUNTER — Encounter: Payer: Self-pay | Admitting: Internal Medicine

## 2019-05-31 ENCOUNTER — Other Ambulatory Visit: Payer: Self-pay

## 2019-05-31 ENCOUNTER — Other Ambulatory Visit (INDEPENDENT_AMBULATORY_CARE_PROVIDER_SITE_OTHER): Payer: Managed Care, Other (non HMO)

## 2019-05-31 DIAGNOSIS — E0500 Thyrotoxicosis with diffuse goiter without thyrotoxic crisis or storm: Secondary | ICD-10-CM | POA: Diagnosis not present

## 2019-06-01 ENCOUNTER — Other Ambulatory Visit: Payer: Self-pay | Admitting: Internal Medicine

## 2019-06-01 DIAGNOSIS — E0500 Thyrotoxicosis with diffuse goiter without thyrotoxic crisis or storm: Secondary | ICD-10-CM

## 2019-06-01 LAB — T3, FREE: T3, Free: 3.2 pg/mL (ref 2.3–4.2)

## 2019-06-01 LAB — TSH: TSH: <0.01 u[IU]/mL — ABNORMAL LOW (ref 0.35–4.50)

## 2019-06-01 LAB — T4, FREE: Free T4: 0.72 ng/dL (ref 0.60–1.60)

## 2019-06-01 MED ORDER — METHIMAZOLE 10 MG PO TABS: ORAL_TABLET | ORAL | 2 refills | Status: DC

## 2019-06-06 ENCOUNTER — Encounter: Payer: Managed Care, Other (non HMO) | Admitting: Gastroenterology

## 2019-06-10 NOTE — Progress Notes (Deleted)
Cardiology Office Note:   Date:  06/10/2019  NAME:  Carla West    MRN: [id number] DOB:  Oct 24, 1960   PCP:  Mackie Pai, PA-C  Cardiologist:  Evalina Field, MD  Electrophysiologist:  None   Referring MD: Elise Benne   No chief complaint on file. ***  History of Present Illness:   Carla West is a 58 y.o. female with a hx of non-STEMI status post PCI to proximal left circumflex 02/27/2019, hypothyroidism who presents for follow-up.  She has been seen by gastroenterology with symptoms of diarrhea in the setting of thyrotoxicosis.  We have asked for any delay in screening to be done for 1 year.  Unless she has active symptoms of bleeding we would prefer her to stay on this for 1 year.  Problem List: 1. NSTEMI s/p DES to Chloride - 02/27/2019  Past Medical History: Past Medical History:  Diagnosis Date  . Abnormal Pap smear of cervix 08/03/2015  . Benign essential HTN 08/03/2015  . History of chicken pox   . Migraine headache 08/03/2015    Past Surgical History: Past Surgical History:  Procedure Laterality Date  . CORONARY STENT INTERVENTION N/A 02/27/2019   Procedure: CORONARY STENT INTERVENTION;  Surgeon: Burnell Blanks, MD;  Location: [facility];  Service: Cardiovascular;  Laterality: N/A;  . LEFT HEART CATH AND CORONARY ANGIOGRAPHY N/A 02/27/2019   Procedure: LEFT HEART CATH AND CORONARY ANGIOGRAPHY;  Surgeon: Burnell Blanks, MD;  Location: [facility];  Service: Cardiovascular;  Laterality: N/A;  . TUBAL LIGATION      Current Medications: No outpatient medications have been marked as taking for the 06/11/19 encounter (Appointment) with O'Neal, Cassie Freer, MD.     Allergies:    Elavil [amitriptyline hcl]   Social History: Social History   Socioeconomic History  . Marital status: Divorced    Spouse name: Not on file  . Number of children: Not on file  . Years of education: Not on file  . Highest education level:  Not on file  Occupational History  . Not on file  Tobacco Use  . Smoking status: Never Smoker  . Smokeless tobacco: Never Used  Substance and Sexual Activity  . Alcohol use: No  . Drug use: No  . 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 Not on file  Social History Narrative  . Not on file   Social Determinants of Health   Financial Resource Strain:   . Difficulty of Paying Living Expenses: Not on file  Food Insecurity:   . Worried About Charity fundraiser in the Last Year: Not on file  . Ran Out of Food in the Last Year: Not on file  Transportation Needs:   . Lack of Transportation (Medical): Not on file  . Lack of Transportation (Non-Medical): Not on file  Physical Activity:   . Days of Exercise per Week: Not on file  . Minutes of Exercise per Session: Not on file  Stress:   . Feeling of Stress : Not on file  Social Connections:   . Frequency of Communication with Friends and Family: Not on file  . Frequency of Social Gatherings with Friends and Family: Not on file  . Attends Religious Services: Not on file  . Active Member of Clubs or Organizations: Not on file  . Attends Archivist Meetings: Not on file  . Marital  Status: Not on file     Family History: The patient's ***family history includes Breast cancer in her maternal grandmother; Gout in her brother; Heart disease in her brother and mother; Hyperlipidemia in her father and sister; Hypertension in her brother, brother, and mother; Migraines in her son; Other in her brother.  ROS:   All other ROS reviewed and negative. Pertinent positives noted in the HPI.     EKGs/Labs/Other Studies Reviewed:   The following studies were personally reviewed by me today:  EKG:  EKG is *** ordered today.  The ekg ordered today demonstrates ***, and was personally reviewed by me.   TTE 02/27/2019  1. The left ventricle  has normal systolic function, with an ejection fraction of 60-65%. The cavity size was normal. There is mildly increased left ventricular wall thickness. No evidence of left ventricular regional wall motion abnormalities.  2. The right ventricle has normal systolc function. The cavity was normal. There is no increase in right ventricular wall thickness.  3. Left atrial size was moderately dilated.  4. The mitral valve is abnormal. Mild thickening of the mitral valve leaflet. Mitral valve regurgitation is mild to moderate by color flow Doppler.  5. The aortic valve is tricuspid Mild sclerosis of the aortic valve.  6. The ascending aorta and aortic root are normal in size and structure.  7. The inferior vena cava was normal in size with <50% respiratory variability.  LHC 02/27/2019  Prox RCA lesion is 30% stenosed.  Prox Cx lesion is 99% stenosed.  Mid LAD lesion is 20% stenosed.  2nd Mrg lesion is 40% stenosed.  A drug-eluting stent was successfully placed using a STENT RESOLUTE ONYX 3.5X12.  Post intervention, there is a 0% residual stenosis.   1. Mild non-obstructive disease in the LAD and RCA 2. 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  3. Successful PTCA/DES x 1 proximal Circumflex   Recommendations: Dual anti-platelet therapy with ASA and Brilinta for one year. Continue statin and beta blocker.   Recent Labs: 01/23/2019: Pro B Natriuretic peptide (BNP) 152.0 02/27/2019: B Natriuretic Peptide 624.9 02/28/2019: Magnesium 1.7 03/14/2019: ALT 43; BUN 15; Creatinine, Ser 0.62; Hemoglobin 11.2; Platelets 318.0; Potassium 4.1; Sodium 141 05/31/2019: TSH <0.01   Recent Lipid Panel    Component Value Date/Time   CHOL 154 04/04/2019 1010   TRIG 49 04/04/2019 1010   HDL 83 04/04/2019 1010   CHOLHDL 1.9 04/04/2019 1010   CHOLHDL 2.4 02/27/2019 0500   VLDL 9 02/27/2019 0500   LDLCALC 60 04/04/2019 1010     Physical Exam:   VS:  There were no vitals taken for this visit.   Wt Readings from Last 3 Encounters:  04/30/19 141 lb (64 kg)  04/06/19 140 lb (63.5 kg)  04/04/19 138 lb 9.6 oz (62.9 kg)    General: Well nourished, well developed, in no acute distress Heart: Atraumatic, normal size  Eyes: PEERLA, EOMI  Neck: Supple, no JVD Endocrine: No thryomegaly Cardiac: Normal S1, S2; RRR; no murmurs, rubs, or gallops Lungs: Clear to auscultation bilaterally, no wheezing, rhonchi or rales  Abd: Soft, nontender, no hepatomegaly  Ext: No edema, pulses 2+ Musculoskeletal: No deformities, BUE and BLE strength normal and equal Skin: Warm and dry, no rashes   Neuro: Alert and oriented to person, place, time, and situation, CNII-XII grossly intact, no focal deficits  Psych: Normal mood and affect   ASSESSMENT:   Unknown FoleySheila Gayheart is  a 58 y.o. female who presents for the following: No diagnosis found.  PLAN:   There are no diagnoses linked to this encounter.  Disposition: No follow-ups on file.  Medication Adjustments/Labs and Tests Ordered: Current medicines are reviewed at length with the patient today.  Concerns regarding medicines are outlined above.  No orders of the defined types were placed in this encounter.  No orders of the defined types were placed in this encounter.   There are no Patient Instructions on file for this visit.   Signed, Lenna Gilford. Flora Lipps, MD [facility]  [address] [place], [place] [address] [phone]  06/10/2019 3:05 PM

## 2019-06-11 ENCOUNTER — Ambulatory Visit: Payer: Managed Care, Other (non HMO) | Admitting: Cardiovascular Disease

## 2019-06-12 ENCOUNTER — Telehealth: Payer: Self-pay | Admitting: Cardiovascular Disease

## 2019-06-12 NOTE — Telephone Encounter (Signed)
She needs a monitor/EKG. Also needs BP meds adjusted. We can discuss further on 12/31. -W

## 2019-06-12 NOTE — Telephone Encounter (Signed)
Patient aware of MD advice/recommendations. Voiced understanding.

## 2019-06-12 NOTE — Telephone Encounter (Signed)
Returned call to patient of Dr. Audie Box who reports palps since yesterday but occurred on occasion last week. Her palps occurred almost all day yesterday. She is unsure if her symptoms are related to stress and cannot pinpoint any particular triggers. When her heart is racing, she deep breathes to calm herself down and sometimes this helps. She does feel like she cannot catch her breath on occasion.   Her BP 153/106 in right arm and 140/110 in left arm. Her HR was 80-89 bpm. She takes metoprolol succinate & amlodipine in the morning.   She has an appt scheduled with MD on 12/31  Will route to him to review/advise

## 2019-06-12 NOTE — Telephone Encounter (Signed)
Patient c/o Palpitations:  High priority if patient c/o lightheadedness, shortness of breath, or chest pain  1) How long have you had palpitations/irregular HR/ Afib? Are you having the symptoms now? Palpatations, yes   2) Are you currently experiencing lightheadedness, SOB or CP? no  3) Do you have a history of afib (atrial fibrillation) or irregular heart rhythm? no  4) Have you checked your BP or HR? (document readings if available): No. Will, have readings available for when nurse calls back  5) Are you experiencing any other symptoms? Just feels like her heart is beating really fast. She wants to know if she should do a virtual appt today or if waiting until her Thursday appt with Dr. Audie Box will be okay.

## 2019-06-13 ENCOUNTER — Encounter: Payer: Self-pay | Admitting: Internal Medicine

## 2019-06-13 NOTE — Progress Notes (Signed)
Cardiology Office Note:   Date:  06/14/2019  NAME:  Trenisha Lafavor    MRN: [id number] DOB:  14-Nov-1960   PCP:  Mackie Pai, PA-C  Cardiologist:  Evalina Field, MD   Referring MD: Elise Benne   Chief Complaint  Patient presents with  . Palpitations   History of Present Illness:   Brandi Tomlinson is a 58 y.o. female with a hx of NSTEMI s/p PCI to [place] (02/27/2019), hyperthyroidism, HTN who presents for follow-up.  She reports she is began to experience palpitations.  She reports this started 4 days ago.  She gets them daily.  They last 1 to 2 hours.  No identifiable trigger.  No alleviating factors.  No excess caffeine consumption reported.  Her thyroid studies seem to have normalized.  She has had fluctuation in her T4.  She also reports poor sleep habits and difficulty staying asleep.  She was on trazodone in the past but has not been able to get this refilled.  We will refill it today.  She also reports poor energy and lack of exercise.  She also has gained some weight back.  Overall, I suspect given that her thyroid is getting back to normal she is experiencing fluctuations in this.  She denies any chest pain or trouble breathing.  When she does move or exert herself she has no issues with this.  She reports no issues with depression or anxiety.  She states she is doing quite well.  She did lose both of her parents and this was 1 year ago.  I am sure the holidays are hard for her.  She was seen by gastroenterology and we are delaying her colonoscopy until her 1 year of DAPT is done.  I did inform her that we will avoid any elective surgery until she is completed 1 year of therapy.  Problem List: 1. NSTEMI 02/27/2019: PCI to [place] 2. HTN 3. Hyperthyroidism   Past Medical History: Past Medical History:  Diagnosis Date  . Abnormal Pap smear of cervix 08/03/2015  . Benign essential HTN 08/03/2015  . History of chicken pox   . Migraine headache 08/03/2015    Past Surgical  History: Past Surgical History:  Procedure Laterality Date  . CORONARY STENT INTERVENTION N/A 02/27/2019   Procedure: CORONARY STENT INTERVENTION;  Surgeon: Burnell Blanks, MD;  Location: [facility];  Service: Cardiovascular;  Laterality: N/A;  . LEFT HEART CATH AND CORONARY ANGIOGRAPHY N/A 02/27/2019   Procedure: LEFT HEART CATH AND CORONARY ANGIOGRAPHY;  Surgeon: Burnell Blanks, MD;  Location: [facility];  Service: Cardiovascular;  Laterality: N/A;  . TUBAL LIGATION      Current Medications: No outpatient medications have been marked as taking for the 06/14/19 encounter (Office Visit) with Geralynn Rile, MD.     Allergies:    Elavil [amitriptyline hcl]   Social History: Social History   Socioeconomic History  . Marital status: Divorced    Spouse name: Not on file  . Number of children: Not on file  . Years of education: Not on file  . Highest education level: Not on file  Occupational History  . Not on file  Tobacco Use  . Smoking status: Never Smoker  . Smokeless tobacco: Never Used  Substance and Sexual Activity  . Alcohol use: No  . Drug use: No  . 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  Not on file  Social History Narrative  . Not on file   Social Determinants of Health   Financial Resource Strain:   . Difficulty of Paying Living Expenses: Not on file  Food Insecurity:   . Worried About Programme researcher, broadcasting/film/video in the Last Year: Not on file  . Ran Out of Food in the Last Year: Not on file  Transportation Needs:   . Lack of Transportation (Medical): Not on file  . Lack of Transportation (Non-Medical): Not on file  Physical Activity:   . Days of Exercise per Week: Not on file  . Minutes of Exercise per Session: Not on file  Stress:   . Feeling of Stress : Not on file  Social Connections:   . Frequency of Communication with  Friends and Family: Not on file  . Frequency of Social Gatherings with Friends and Family: Not on file  . Attends Religious Services: Not on file  . Active Member of Clubs or Organizations: Not on file  . Attends Banker Meetings: Not on file  . Marital Status: Not on file     Family History: The patient's family history includes Breast cancer in her maternal grandmother; Gout in her brother; Heart disease in her brother and mother; Hyperlipidemia in her father and sister; Hypertension in her brother, brother, and mother; Migraines in her son; Other in her brother.  ROS:   All other ROS reviewed and negative. Pertinent positives noted in the HPI.     EKGs/Labs/Other Studies Reviewed:   The following studies were personally reviewed by me today:  TSH less than 0.01, T4 0.72  EKG:  EKG is ordered today.  The ekg ordered today demonstrates normal sinus rhythm, heart rate 72, no acute ST-T changes, no evidence of prior infarction, normal intervals, and was personally reviewed by me.   LHC 02/27/2019  Prox RCA lesion is 30% stenosed.  Prox Cx lesion is 99% stenosed.  Mid LAD lesion is 20% stenosed.  2nd Mrg lesion is 40% stenosed.  A drug-eluting stent was successfully placed using a STENT RESOLUTE ONYX 3.5X12.  Post intervention, there is a 0% residual stenosis.   1. Mild non-obstructive disease in the LAD and RCA 2. 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  3. Successful PTCA/DES x 1 proximal Circumflex   Echo 02/27/2019  1. The left ventricle has normal systolic function, with an ejection fraction of 60-65%. The cavity size was normal. There is mildly increased left ventricular wall thickness. No evidence of left ventricular regional wall motion abnormalities.  2. The right ventricle has normal systolc function. The cavity was normal. There is no increase in right ventricular wall thickness.   3. Left atrial size was moderately dilated.  4. The mitral valve is abnormal. Mild thickening of the mitral valve leaflet. Mitral valve regurgitation is mild to moderate by color flow Doppler.  5. The aortic valve is tricuspid Mild sclerosis of the aortic valve.  6. The ascending aorta and aortic root are normal in size and structure.  7. The inferior vena cava was normal in size with <50% respiratory variability.  Recent Labs: 01/23/2019: Pro B Natriuretic peptide (BNP) 152.0 02/27/2019: B Natriuretic Peptide 624.9 02/28/2019: Magnesium 1.7 03/14/2019: ALT 43; BUN 15; Creatinine, Ser 0.62; Hemoglobin 11.2; Platelets 318.0; Potassium 4.1; Sodium 141 05/31/2019: TSH <0.01   Recent Lipid Panel    Component Value Date/Time   CHOL 154 04/04/2019 1010  TRIG 49 04/04/2019 1010   HDL 83 04/04/2019 1010   CHOLHDL 1.9 04/04/2019 1010   CHOLHDL 2.4 02/27/2019 0500   VLDL 9 02/27/2019 0500   LDLCALC 60 04/04/2019 1010    Physical Exam:   VS:  BP (!) 150/96   Pulse 72   Temp (!) 97.3 F (36.3 C)   Ht 5\' 7"  (1.702 m)   Wt 154 lb (69.9 kg)   SpO2 97%   BMI 24.12 kg/m    Wt Readings from Last 3 Encounters:  06/14/19 154 lb (69.9 kg)  04/30/19 141 lb (64 kg)  04/06/19 140 lb (63.5 kg)    General: Well nourished, well developed, in no acute distress Heart: Atraumatic, normal size  Eyes: PEERLA, EOMI  Neck: Supple, no JVD Endocrine: No thryomegaly Cardiac: Normal S1, S2; RRR; no murmurs, rubs, or gallops Lungs: Clear to auscultation bilaterally, no wheezing, rhonchi or rales  Abd: Soft, nontender, no hepatomegaly  Ext: No edema, pulses 2+ Musculoskeletal: No deformities, BUE and BLE strength normal and equal Skin: Warm and dry, no rashes   Neuro: Alert and oriented to person, place, time, and situation, CNII-XII grossly intact, no focal deficits  Psych: Normal mood and affect   ASSESSMENT:   Jameca Chumley is a 58 y.o. female who presents for the following: 1. Coronary artery  disease involving native coronary artery of native heart without angina pectoris   2. Essential hypertension   3. Palpitations   4. Other insomnia     PLAN:   1. Coronary artery disease involving native coronary artery of native heart without angina pectoris -PCI to pLCX with NSTEMI 02/2019 -No chest pain or symptoms of angina today -Continue aspirin and ticagrelor 90 mg twice daily.  I have informed her that we will delay any nonurgent surgery for 1 year.  She is in agreement with this plan. -Blood pressure a bit up today but I do suspect her poor sleep and insomnia are playing a issue here.  We will represcribe her trazodone to see if this will help her.  I will see her back in 3 months we will decide if she needs more medications for this. -Continue Norvasc 10 mg -Continue metoprolol succinate 100 mg daily for at least 3 years -LDL cholesterol at goal, 60  2. Essential hypertension -Not at goal today but I do suspect this is related to poor sleep.  We will get her back on trazodone to see if this helps with sleep.  She also will start to exercise in the new year.  I will see her back in 3 months and if BP not at goal we will start medication.  3. Palpitations -Palpitations are concerning.  She is high risk for A. fib given CAD & hypothyroidism.  We will start with a 3-day ZIO patch -Could also be related to insomnia as well as fatigue and fluctuations in thyroid  4. Insomnia -Restart trazodone 50 mg nightly   Disposition: Return in about 3 months (around 09/12/2019).  Medication Adjustments/Labs and Tests Ordered: Current medicines are reviewed at length with the patient today.  Concerns regarding medicines are outlined above.  Orders Placed This Encounter  Procedures  . LONG TERM MONITOR (3-14 DAYS)  . EKG 12-Lead   Meds ordered this encounter  Medications  . amLODipine (NORVASC) 10 MG tablet    Sig: Take 1 tablet (10 mg total) by mouth daily.    Dispense:  30 tablet     Refill:  5  . atorvastatin (  LIPITOR) 80 MG tablet    Sig: Take 1 tablet (80 mg total) by mouth daily at 6 PM.    Dispense:  90 tablet    Refill:  3  . metoprolol succinate (TOPROL-XL) 100 MG 24 hr tablet    Sig: Take 1 tablet (100 mg total) by mouth daily. Take with or immediately following a meal.    Dispense:  90 tablet    Refill:  3  . ticagrelor (BRILINTA) 90 MG TABS tablet    Sig: Take 1 tablet (90 mg total) by mouth 2 (two) times daily.    Dispense:  180 tablet    Refill:  3  . traZODone (DESYREL) 50 MG tablet    Sig: Take 1 tablet (50 mg total) by mouth at bedtime.    Dispense:  90 tablet    Refill:  3    Patient Instructions  Medication Instructions:  NO CHANGES  *If you need a refill on your cardiac medications before your next appointment, please call your pharmacy*  Lab Work: NONE NEEDED  Testing/Procedures: Christena Deem- Long Term Monitor Instructions   Your physician has requested you wear your ZIO patch monitor__3_____days.   This is a single patch monitor.  [employer] supplies one patch monitor per enrollment.  Additional stickers are not available.   Please do not apply patch if you will be having a Nuclear Stress Test, Echocardiogram, Cardiac CT, MRI, or Chest Xray during the time frame you would be wearing the monitor. The patch cannot be worn during these tests.  You cannot remove and re-apply the ZIO XT patch monitor.   Your ZIO patch monitor will be sent USPS Priority mail from [facility] directly to your home address. The monitor may also be mailed to a PO BOX if home delivery is not available.   It may take 3-5 days to receive your monitor after you have been enrolled.   Once you have received you monitor, please review enclosed instructions.  Your monitor has already been registered assigning a specific monitor serial # to you.   Applying the monitor   Shave hair from upper left chest.   Hold abrader disc by orange tab.  Rub abrader in 40 strokes  over left upper chest as indicated in your monitor instructions.   Clean area with 4 enclosed alcohol pads .  Use all pads to assure are is cleaned thoroughly.  Let dry.   Apply patch as indicated in monitor instructions.  Patch will be place under collarbone on left side of chest with arrow pointing upward.   Rub patch adhesive wings for 2 minutes.Remove white label marked "1".  Remove white label marked "2".  Rub patch adhesive wings for 2 additional minutes.   While looking in a mirror, press and release button in center of patch.  A small green light will flash 3-4 times .  This will be your only indicator the monitor has been turned on.     Do not shower for the first 24 hours.  You may shower after the first 24 hours.   Press button if you feel a symptom. You will hear a small click.  Record Date, Time and Symptom in the Patient Log Book.   When you are ready to remove patch, follow instructions on last 2 pages of Patient Log Book.  Stick patch monitor onto last page of Patient Log Book.   Place Patient Log Book in Crown box.  Use locking tab on box and tape  box closed securely.  The [employer] box has [employer] postage on it.  Please place in mailbox as soon as possible.  Your physician should have your test results approximately 7 days after the monitor has been [facility] to [facility].   Call [facility] Customer Care at [phone] if you have questions regarding your ZIO XT patch monitor.  Call them immediately if you see an orange light blinking on your monitor.   If your monitor falls off in less than 4 days contact our Monitor department at [phone].  If your monitor becomes loose or falls off after 4 days call [employer] at [phone] for suggestions on securing your monitor.    Follow-Up: At [facility], you and your health needs are our priority.  As part of our continuing mission to provide you with exceptional heart care, we have created designated  Provider Care Teams.  These Care Teams include your primary Cardiologist (physician) and Advanced Practice Providers (APPs -  Physician Assistants and Nurse Practitioners) who all work together to provide you with the care you need, when you need it.  Your next appointment:   3 month(s)  The format for your next appointment:   In Person  Provider:   Lennie OdorWesley O'Neal, MD     Signed, Lenna GilfordWesley T. Flora Lipps'Neal, MD [facility]  [facility]  [address], [address] [place], [place] [id number] [phone]  06/14/2019 12:23 PM

## 2019-06-14 ENCOUNTER — Ambulatory Visit: Payer: Managed Care, Other (non HMO) | Admitting: Cardiovascular Disease

## 2019-06-14 ENCOUNTER — Encounter: Payer: Self-pay | Admitting: *Deleted

## 2019-06-14 ENCOUNTER — Encounter: Payer: Self-pay | Admitting: Cardiovascular Disease

## 2019-06-14 ENCOUNTER — Other Ambulatory Visit: Payer: Self-pay

## 2019-06-14 VITALS — BP 150/96 | HR 72 | Temp 97.3°F | Ht 67.0 in | Wt 154.0 lb

## 2019-06-14 DIAGNOSIS — G4709 Other insomnia: Secondary | ICD-10-CM

## 2019-06-14 DIAGNOSIS — I2510 Atherosclerotic heart disease of native coronary artery without angina pectoris: Secondary | ICD-10-CM | POA: Diagnosis not present

## 2019-06-14 DIAGNOSIS — R002 Palpitations: Secondary | ICD-10-CM

## 2019-06-14 DIAGNOSIS — I10 Essential (primary) hypertension: Secondary | ICD-10-CM | POA: Diagnosis not present

## 2019-06-14 MED ORDER — TICAGRELOR 90 MG PO TABS: 90.0000 mg | ORAL_TABLET | Freq: Two times a day (BID) | ORAL | 3 refills | Status: DC

## 2019-06-14 MED ORDER — TRAZODONE HCL 50 MG PO TABS: 50.0000 mg | ORAL_TABLET | Freq: Every day | ORAL | 3 refills | Status: DC

## 2019-06-14 MED ORDER — AMLODIPINE BESYLATE 10 MG PO TABS: 10.0000 mg | ORAL_TABLET | Freq: Every day | ORAL | 5 refills | Status: DC

## 2019-06-14 MED ORDER — METOPROLOL SUCCINATE ER 100 MG PO TB24: 100.0000 mg | ORAL_TABLET | Freq: Every day | ORAL | 3 refills | Status: DC

## 2019-06-14 MED ORDER — ATORVASTATIN CALCIUM 80 MG PO TABS: 80.0000 mg | ORAL_TABLET | Freq: Every day | ORAL | 3 refills | Status: DC

## 2019-06-14 NOTE — Progress Notes (Signed)
Patient ID: Carla West, female   DOB: 02-09-1961, 58 y.o.   MRN: [id number] Patient enrolled for 3 day ZIO XT long term holter monitor to be mailed to her home.

## 2019-06-14 NOTE — Patient Instructions (Signed)
Medication Instructions:  NO CHANGES  *If you need a refill on your cardiac medications before your next appointment, please call your pharmacy*  Lab Work: NONE NEEDED  Testing/Procedures: Christena Deem- Long Term Monitor Instructions   Your physician has requested you wear your ZIO patch monitor__3_____days.   This is a single patch monitor.  [employer] supplies one patch monitor per enrollment.  Additional stickers are not available.   Please do not apply patch if you will be having a Nuclear Stress Test, Echocardiogram, Cardiac CT, MRI, or Chest Xray during the time frame you would be wearing the monitor. The patch cannot be worn during these tests.  You cannot remove and re-apply the ZIO XT patch monitor.   Your ZIO patch monitor will be sent USPS Priority mail from [facility] directly to your home address. The monitor may also be mailed to a PO BOX if home delivery is not available.   It may take 3-5 days to receive your monitor after you have been enrolled.   Once you have received you monitor, please review enclosed instructions.  Your monitor has already been registered assigning a specific monitor serial # to you.   Applying the monitor   Shave hair from upper left chest.   Hold abrader disc by orange tab.  Rub abrader in 40 strokes over left upper chest as indicated in your monitor instructions.   Clean area with 4 enclosed alcohol pads .  Use all pads to assure are is cleaned thoroughly.  Let dry.   Apply patch as indicated in monitor instructions.  Patch will be place under collarbone on left side of chest with arrow pointing upward.   Rub patch adhesive wings for 2 minutes.Remove white label marked "1".  Remove white label marked "2".  Rub patch adhesive wings for 2 additional minutes.   While looking in a mirror, press and release button in center of patch.  A small green light will flash 3-4 times .  This will be your only indicator the monitor has been turned on.      Do not shower for the first 24 hours.  You may shower after the first 24 hours.   Press button if you feel a symptom. You will hear a small click.  Record Date, Time and Symptom in the Patient Log Book.   When you are ready to remove patch, follow instructions on last 2 pages of Patient Log Book.  Stick patch monitor onto last page of Patient Log Book.   Place Patient Log Book in [place] box.  Use locking tab on box and tape box closed securely.  The [employer] and [employer] has [employer] on it.  Please place in mailbox as soon as possible.  Your physician should have your test results approximately 7 days after the monitor has been mailed back to [facility].   Call [facility] Customer Care at [phone] if you have questions regarding your ZIO XT patch monitor.  Call them immediately if you see an orange light blinking on your monitor.   If your monitor falls off in less than 4 days contact our Monitor department at [phone].  If your monitor becomes loose or falls off after 4 days call [employer] at [phone] for suggestions on securing your monitor.    Follow-Up: At [facility], you and your health needs are our priority.  As part of our continuing mission to provide you with exceptional heart care, we have created designated Provider Care Teams.  These Care Teams include your primary Cardiologist (physician) and Advanced Practice Providers (APPs -  Physician Assistants and Nurse Practitioners) who all work together to provide you with the care you need, when you need it.  Your next appointment:   3 month(s)  The format for your next appointment:   In Person  Provider:   Eleonore Chiquito, MD

## 2019-06-19 ENCOUNTER — Other Ambulatory Visit: Payer: Self-pay | Admitting: Medical

## 2019-06-21 ENCOUNTER — Ambulatory Visit (INDEPENDENT_AMBULATORY_CARE_PROVIDER_SITE_OTHER): Payer: Managed Care, Other (non HMO)

## 2019-06-21 DIAGNOSIS — R002 Palpitations: Secondary | ICD-10-CM

## 2019-06-25 ENCOUNTER — Ambulatory Visit: Payer: Managed Care, Other (non HMO)

## 2019-06-26 ENCOUNTER — Telehealth: Payer: Self-pay | Admitting: Medical

## 2019-06-26 ENCOUNTER — Encounter (HOSPITAL_BASED_OUTPATIENT_CLINIC_OR_DEPARTMENT_OTHER): Payer: Self-pay | Admitting: Emergency Medicine

## 2019-06-26 ENCOUNTER — Other Ambulatory Visit: Payer: Self-pay

## 2019-06-26 ENCOUNTER — Emergency Department (HOSPITAL_BASED_OUTPATIENT_CLINIC_OR_DEPARTMENT_OTHER)
Admission: EM | Admit: 2019-06-26 | Discharge: 2019-06-26 | Disposition: A | Discharge status: Home / Self Care | Payer: Managed Care, Other (non HMO) | Attending: Emergency Medicine | Admitting: Emergency Medicine

## 2019-06-26 DIAGNOSIS — I252 Old myocardial infarction: Secondary | ICD-10-CM | POA: Insufficient documentation

## 2019-06-26 DIAGNOSIS — I10 Essential (primary) hypertension: Secondary | ICD-10-CM | POA: Diagnosis not present

## 2019-06-26 DIAGNOSIS — R519 Headache, unspecified: Secondary | ICD-10-CM | POA: Insufficient documentation

## 2019-06-26 DIAGNOSIS — Z7982 Long term (current) use of aspirin: Secondary | ICD-10-CM | POA: Diagnosis not present

## 2019-06-26 DIAGNOSIS — T50Z95A Adverse effect of other vaccines and biological substances, initial encounter: Secondary | ICD-10-CM | POA: Diagnosis not present

## 2019-06-26 DIAGNOSIS — Z79899 Other long term (current) drug therapy: Secondary | ICD-10-CM | POA: Diagnosis not present

## 2019-06-26 HISTORY — DX: Thyrotoxicosis with diffuse goiter without thyrotoxic crisis or storm: E05.00

## 2019-06-26 MED ORDER — PROMETHAZINE HCL 25 MG/ML IJ SOLN
12.5000 mg | Freq: Once | INTRAMUSCULAR | Status: AC
  Administered 2019-06-26: 12.5 mg via INTRAVENOUS

## 2019-06-26 MED ORDER — DROPERIDOL 2.5 MG/ML IJ SOLN
1.2500 mg | Freq: Once | INTRAMUSCULAR | Status: AC
  Administered 2019-06-26: 1.25 mg via INTRAVENOUS
  Filled 2019-06-26: qty 2

## 2019-06-26 MED ORDER — PROMETHAZINE HCL 25 MG/ML IJ SOLN
INTRAMUSCULAR | Status: AC
  Filled 2019-06-26: qty 1

## 2019-06-26 MED ORDER — PROMETHAZINE HCL 25 MG PO TABS: 25.0000 mg | ORAL_TABLET | Freq: Four times a day (QID) | ORAL | 0 refills | Status: DC | PRN

## 2019-06-26 MED ORDER — ALMOTRIPTAN MALATE 12.5 MG PO TABS: ORAL_TABLET | ORAL | 0 refills | Status: DC

## 2019-06-26 MED ORDER — SODIUM CHLORIDE 0.9 % IV BOLUS
1000.0000 mL | Freq: Once | INTRAVENOUS | Status: AC
  Administered 2019-06-26: 1000 mL via INTRAVENOUS

## 2019-06-26 MED FILL — PROMETHAZINE 25 MG TABLET: 25 | 7 days supply | Qty: 30 | Fill #0

## 2019-06-26 MED FILL — ALMOTRIPTAN MALATE 12.5 MG: 12.5 | 10 days supply | Qty: 20 | Fill #0

## 2019-06-26 NOTE — Telephone Encounter (Signed)
Daughter came in office wanting to inform provider that pt is in the ER and wanted provider to be aware.

## 2019-06-26 NOTE — ED Triage Notes (Signed)
Pt states she has a migraine with the majority of the pain on the right side  Pt has hx of migraines but states this one feels different  Pt is also c/o vomiting and diarrhea  Pt states she had the covid vaccine yesterday so is unsure if she is having a reaction to that  Pt also is c/o her hands tingling

## 2019-06-26 NOTE — ED Notes (Signed)
Went in to discharge pt and she states she just vomited prior to me coming in the room  Provider notified

## 2019-06-26 NOTE — Telephone Encounter (Signed)
Noted  

## 2019-06-26 NOTE — Telephone Encounter (Signed)
Just FYI.

## 2019-06-26 NOTE — ED Notes (Signed)
ED Provider at bedside. 

## 2019-06-26 NOTE — Discharge Instructions (Addendum)
Please read instructions below. Stay hydrated. You can take tylenol every 6 hours as needed for body aches/headache. You can also take your migraine medication as directed. Schedule an appointment with your primary care provider to follow up on your visit today. Return to the ER for severely worsening headache, vision changes, high persistent fever, weakness or numbness, or new or concerning symptoms.

## 2019-06-26 NOTE — ED Provider Notes (Signed)
[facility] EMERGENCY DEPARTMENT Provider Note   CSN: [id number] Arrival date & time: 06/26/19  7616     History Chief Complaint  Patient presents with  . Migraine  . Diarrhea    Carla West is a 59 y.o. female w PMHx migraine HA, thyrotoxicosis, HTN, CAD, presenting to the ED with complaint of migraine HA that began early this morning around 2am.  Patient states pain is on the right side of her head described as throbbing, or somebody hammering her in the head.  She has history of migraine headaches and is prescribed almotripatn for which she treated this morning without significant relief.  She has associated photophobia, nausea, and body aches in her arms and back.  She recently had the COVID-19 vaccine yesterday morning around 10 AM.  No vision changes.  The history is provided by the patient.       Past Medical History:  Diagnosis Date  . Abnormal Pap smear of cervix 08/03/2015  . Benign essential HTN 08/03/2015  . Graves disease   . History of chicken pox   . Migraine headache 08/03/2015    Patient Active Problem List   Diagnosis Date Noted  . NSTEMI (non-ST elevated myocardial infarction) (HCC) 02/27/2019  . Hypokalemia 02/27/2019  . 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 Atypical chest pain 01/29/2019  . Palpitations 04/11/2018  . Pain in right toe(s) 04/11/2018  . Abnormal TSH 12/28/2017  . Hyperlipidemia 12/28/2017  . Preventative health care 12/07/2017  . Essential hypertension 08/03/2015  . Migraine headache 08/03/2015  . Abnormal Pap smear of cervix 08/03/2015  . History of chicken pox     Past Surgical History:  Procedure Laterality Date  . CORONARY STENT INTERVENTION N/A 02/27/2019   Procedure: CORONARY STENT INTERVENTION;  Surgeon: Kathleene Hazel, MD;  Location: [facility]VASIVE [facility];  Service: Cardiovascular;  Laterality: N/A;  . LEFT HEART CATH AND CORONARY ANGIOGRAPHY N/A 02/27/2019   Procedure: LEFT HEART CATH AND  CORONARY ANGIOGRAPHY;  Surgeon: Kathleene Hazel, MD;  Location: [facility]AS[facility] CV [facility];  Service: Cardiovascular;  Laterality: N/A;  . TUBAL LIGATION       OB History   No obstetric history on file.     Family History  Problem Relation Age of Onset  . Hypertension Mother   . Heart disease Mother   . Hyperlipidemia Father   . Breast cancer Maternal Grandmother   . Hyperlipidemia Sister   . Other Brother        plan crash in [employer]  . Migraines Son   . Heart disease Brother        CHF, arrythmia  . Hypertension Brother   . Gout Brother   . Hypertension Brother     Social History   Tobacco Use  . Smoking status: Never Smoker  . Smokeless tobacco: Never Used  Substance Use Topics  . Alcohol use: No  . Drug use: No    Home Medications Prior to Admission medications   Medication Sig Start Date End Date Taking? Authorizing Provider  almotriptan (AXERT) 12.5 MG tablet Take 1 tablet by mouth twice daily as needed for migraine. May repeat in 2 hours if needed 06/26/19   Morad, [place] N, PA-C  amitriptyline (ELAVIL) 10 MG tablet TAKE 1 TABLET(10 MG) BY MOUTH AT BEDTIME 06/19/19   Saguier, Ramon Dredge, PA-C  amLODipine (NORVASC) 10 MG tablet Take 1 tablet (10 mg total) by mouth daily. 06/14/19   Sande Rives, MD  aspirin EC 81  MG tablet Take 1 tablet (81 mg total) by mouth daily. 01/29/19   Georgeanna Lea, MD  atorvastatin (LIPITOR) 80 MG tablet Take 1 tablet (80 mg total) by mouth daily at 6 PM. 06/14/19   O'Neal, Ronnald Ramp, MD  dicyclomine (BENTYL) 20 MG tablet Take 1 tablet (20 mg total) by mouth 4 (four) times daily -  before meals and at bedtime. Patient taking differently: Take 20 mg by mouth 2 (two) times daily after a meal.  04/30/19   Tressia Danas, MD  methimazole (TAPAZOLE) 10 MG tablet Take 10 mg in a.m. and 10 mg in the evening, with meals 06/01/19   [name], MD  metoprolol succinate (TOPROL-XL) 100 MG 24 hr tablet Take 1 tablet (100 mg  total) by mouth daily. Take with or immediately following a meal. 06/14/19 09/12/19  O'Neal, Ronnald Ramp, MD  promethazine (PHENERGAN) 25 MG tablet Take 1 tablet (25 mg total) by mouth every 6 (six) hours as needed for nausea or vomiting. 06/26/19   Pursley, [place] N, PA-C  sodium chloride (OCEAN) 0.65 % SOLN nasal spray Place 1 spray into both nostrils 2 (two) times daily as needed for congestion. 04/02/19   O'NealRonnald Ramp, MD  ticagrelor (BRILINTA) 90 MG TABS tablet Take 1 tablet (90 mg total) by mouth 2 (two) times daily. 06/14/19   Sande Rives, MD  traZODone (DESYREL) 50 MG tablet Take 1 tablet (50 mg total) by mouth at bedtime. 06/14/19   O'Neal, Ronnald Ramp, MD    Allergies    Elavil [amitriptyline hcl]  Review of Systems   Review of Systems  Eyes: Positive for photophobia. Negative for visual disturbance.  Gastrointestinal: Positive for nausea.  Musculoskeletal: Positive for myalgias (Generalized).  Neurological: Positive for headaches.  All other systems reviewed and are negative.   Physical Exam Updated Vital Signs BP (!) 161/100 (BP Location: Left Arm)   Pulse 93   Temp 99 F (37.2 C) (Oral)   Resp 14   Ht 5\' 7"  (1.702 m)   Wt 68 kg   SpO2 100%   BMI 23.49 kg/m   Physical Exam Vitals and nursing note reviewed.  Constitutional:      Appearance: She is well-developed.  HENT:     Head: Normocephalic and atraumatic.  Eyes:     Conjunctiva/sclera: Conjunctivae normal.  Cardiovascular:     Rate and Rhythm: Normal rate and regular rhythm.  Pulmonary:     Effort: Pulmonary effort is normal. No respiratory distress.     Breath sounds: Normal breath sounds.  Abdominal:     General: Bowel sounds are normal.     Palpations: Abdomen is soft.     Tenderness: There is no abdominal tenderness. There is no guarding or rebound.  Musculoskeletal:     Cervical back: Normal range of motion and neck supple. No rigidity or tenderness.  Skin:    General: Skin  is warm.  Neurological:     Mental Status: She is alert and oriented to person, place, and time.     Comments: No CN deficits.  Equal strength and coordination bilateral upper and lower extremities. Nl gait. Speech fluent without aphasia  Psychiatric:        Behavior: Behavior normal.     ED Results / Procedures / Treatments   Labs (all labs ordered are listed, but only abnormal results are displayed) Labs Reviewed - No data to display  EKG None  Radiology No results found.  Procedures Procedures (including critical care  time)  Medications Ordered in ED Medications  promethazine (PHENERGAN) injection 12.5 mg (has no administration in time range)  droperidol (INAPSINE) 2.5 MG/ML injection 1.25 mg (1.25 mg Intravenous Given 06/26/19 0938)  sodium chloride 0.9 % bolus 1,000 mL (0 mLs Intravenous Stopped 06/26/19 1038)    ED Course  I have reviewed the triage vital signs and the nursing notes.  Pertinent labs & imaging results that were available during my care of the patient were reviewed by me and considered in my medical decision making (see chart for details).  Clinical Course as of Jun 25 1122  Tue Jun 26, 2019  1105 Pt re-evaluated. Reports improvement in symptoms and feel comfortable with discharge.    [JR]    Clinical Course User Index [JR] Newbold, [place] N, PA-C   MDM Rules/Calculators/A&P                      Patient with history of migraine headache, presenting with migraine that began overnight as well as generalized body aches. No focal neuro deficits or red flags.  Patient just had the COVID-19 vaccine yesterday morning, suspect symptoms are largely related.  Will treat migraine and reevaluate.  Pt treated with significant improvement in HA. Body aches likely 2/t common reported symptoms post COVID-19 vaccine. Low suspicion for meningitis are life threatening pathology. Instructed of good PO hydration and symptomatic management. Will provide refill of  migraine medication as pt states she took her last dose today. PCP follow up recommended. Pt agreeable to plan and safe for discharge.  Discussed results, findings, treatment and follow up. Patient advised of return precautions. Patient verbalized understanding and agreed with plan.  Final Clinical Impression(s) / ED Diagnoses Final diagnoses:  Acute nonintractable headache, unspecified headache type  Vaccination side effects, initial encounter    Rx / DC Orders ED Discharge Orders         Ordered    almotriptan (AXERT) 12.5 MG tablet     06/26/19 1110    promethazine (PHENERGAN) 25 MG tablet  Every 6 hours PRN     06/26/19 1120           Hsiao, [place] N, PA-C 06/26/19 1124    Quintella Reichert, MD 06/27/19 [phone]

## 2019-06-26 NOTE — ED Notes (Signed)
Pt is now c/o back pain and right arm pain  Pt crying sitting up on bed

## 2019-06-27 ENCOUNTER — Encounter: Payer: Self-pay | Admitting: Medical

## 2019-06-27 ENCOUNTER — Ambulatory Visit (INDEPENDENT_AMBULATORY_CARE_PROVIDER_SITE_OTHER): Payer: Managed Care, Other (non HMO) | Admitting: Medical

## 2019-06-27 ENCOUNTER — Other Ambulatory Visit: Payer: Self-pay

## 2019-06-27 ENCOUNTER — Ambulatory Visit: Payer: Managed Care, Other (non HMO) | Admitting: Medical

## 2019-06-27 VITALS — BP 140/88 | HR 96 | Temp 97.9°F | Resp 18 | Ht 67.0 in | Wt 148.4 lb

## 2019-06-27 DIAGNOSIS — G43809 Other migraine, not intractable, without status migrainosus: Secondary | ICD-10-CM | POA: Diagnosis not present

## 2019-06-27 DIAGNOSIS — R519 Headache, unspecified: Secondary | ICD-10-CM | POA: Diagnosis not present

## 2019-06-27 DIAGNOSIS — R42 Dizziness and giddiness: Secondary | ICD-10-CM

## 2019-06-27 DIAGNOSIS — H814 Vertigo of central origin: Secondary | ICD-10-CM | POA: Diagnosis not present

## 2019-06-27 MED ORDER — LOSARTAN POTASSIUM 25 MG PO TABS: 25.0000 mg | ORAL_TABLET | Freq: Every day | ORAL | 3 refills | Status: DC

## 2019-06-27 NOTE — Progress Notes (Signed)
Subjective:    Patient ID: Carla West, female    DOB: [birth year], 59 y.o.   MRN: [id number]  HPI  Pt in for follow up from ED.   She states got covid vaccine on Monday. Then she woke up middle of night ha, vomiting and some lower back pain. Pt has hx of covid in the past.    Pt has history of migraines in the past. Pt went to ED yesterday. Gave iv fluids, toradol but ha never resolved completely.  Pt thinks she has very mild-moderate  ha. Level 5/10. Yesterday she states level 10/10. Pt states getting migraine ha about 2 times a month. This ha little different since usually ha decrease a lot more following day. Today ha intense enough she did not feel like working.  More than 10 years since ct of head.  Pt state faint light sensitivity still present. Overall better but pt concerned ha might get back to prior level yesterday.  Pt was rx'd almotiptan/axert yesterday.   Pt bp initially high today little better on second check.   Review of Systems  HENT: Negative for congestion, ear discharge, hearing loss, tinnitus and voice change.   Respiratory: Negative for cough, chest tightness, shortness of breath and wheezing.   Cardiovascular: Negative for chest pain and palpitations.  Gastrointestinal: Negative for abdominal pain, nausea and vomiting.  Skin: Negative for rash.  Neurological: Positive for dizziness and headaches. Negative for syncope, weakness and light-headedness. Seizures:    Psychiatric/Behavioral: Negative for behavioral problems. The patient is not nervous/anxious.    Past Medical History:  Diagnosis Date  . Abnormal Pap smear of cervix 08/03/2015  . Benign essential HTN 08/03/2015  . Graves disease   . History of chicken pox   . Migraine headache 08/03/2015     Social History   Socioeconomic History  . Marital status: Divorced    Spouse name: Not on file  . Number of children: Not on file  . Years of education: Not on file  . Highest education level:  Not on file  Occupational History  . Not on file  Tobacco Use  . Smoking status: Never Smoker  . Smokeless tobacco: Never Used  Substance and Sexual Activity  . Alcohol use: No  . Drug use: No  . 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 Not on file  Social History Narrative  . Not on file   Social Determinants of Health   Financial Resource Strain:   . Difficulty of Paying Living Expenses: Not on file  Food Insecurity:   . Worried About Programme researcher, broadcasting/film/video in the Last Year: Not on file  . Ran Out of Food in the Last Year: Not on file  Transportation Needs:   . Lack of Transportation (Medical): Not on file  . Lack of Transportation (Non-Medical): Not on file  Physical Activity:   . Days of Exercise per Week: Not on file  . Minutes of Exercise per Session: Not on file  Stress:   . Feeling of Stress : Not on file  Social Connections:   . Frequency of Communication with Friends and Family: Not on file  . Frequency of Social Gatherings with Friends and Family: Not on file  . Attends Religious Services: Not on file  . Active Member of Clubs or Organizations: Not on file  . Attends Banker Meetings: Not on file  .  Marital Status: Not on file  Intimate Partner Violence:   . Fear of Current or Ex-Partner: Not on file  . Emotionally Abused: Not on file  . Physically Abused: Not on file  . Sexually Abused: Not on file    Past Surgical History:  Procedure Laterality Date  . CORONARY STENT INTERVENTION N/A 02/27/2019   Procedure: CORONARY STENT INTERVENTION;  Surgeon: Kathleene Hazel, MD;  Location: [facility];  Service: Cardiovascular;  Laterality: N/A;  . LEFT HEART CATH AND CORONARY ANGIOGRAPHY N/A 02/27/2019   Procedure: LEFT HEART CATH AND CORONARY ANGIOGRAPHY;  Surgeon: Kathleene Hazel, MD;  Location: [facility];  Service:  Cardiovascular;  Laterality: N/A;  . TUBAL LIGATION      Family History  Problem Relation Age of Onset  . Hypertension Mother   . Heart disease Mother   . Hyperlipidemia Father   . Breast cancer Maternal Grandmother   . Hyperlipidemia Sister   . Other Brother        plan crash in [employer]  . Migraines Son   . Heart disease Brother        CHF, arrythmia  . Hypertension Brother   . Gout Brother   . Hypertension Brother     Allergies  Allergen Reactions  . Elavil [Amitriptyline Hcl] Other (See Comments)    Jittery..the patient states she has not had any side effects     Current Outpatient Medications on File Prior to Visit  Medication Sig Dispense Refill  . almotriptan (AXERT) 12.5 MG tablet Take 1 tablet by mouth twice daily as needed for migraine. May repeat in 2 hours if needed 20 tablet 0  . amitriptyline (ELAVIL) 10 MG tablet TAKE 1 TABLET(10 MG) BY MOUTH AT BEDTIME 30 tablet 0  . amLODipine (NORVASC) 10 MG tablet Take 1 tablet (10 mg total) by mouth daily. 30 tablet 5  . aspirin EC 81 MG tablet Take 1 tablet (81 mg total) by mouth daily. 90 tablet 3  . atorvastatin (LIPITOR) 80 MG tablet Take 1 tablet (80 mg total) by mouth daily at 6 PM. 90 tablet 3  . dicyclomine (BENTYL) 20 MG tablet Take 1 tablet (20 mg total) by mouth 4 (four) times daily -  before meals and at bedtime. (Patient taking differently: Take 20 mg by mouth 2 (two) times daily after a meal. ) 120 tablet 3  . methimazole (TAPAZOLE) 10 MG tablet Take 10 mg in a.m. and 10 mg in the evening, with meals 90 tablet 2  . metoprolol succinate (TOPROL-XL) 100 MG 24 hr tablet Take 1 tablet (100 mg total) by mouth daily. Take with or immediately following a meal. 90 tablet 3  . promethazine (PHENERGAN) 25 MG tablet Take 1 tablet (25 mg total) by mouth every 6 (six) hours as needed for nausea or vomiting. 30 tablet 0  . sodium chloride (OCEAN) 0.65 % SOLN nasal spray Place 1 spray into both nostrils 2 (two) times daily as  needed for congestion. 30 mL 6  . ticagrelor (BRILINTA) 90 MG TABS tablet Take 1 tablet (90 mg total) by mouth 2 (two) times daily. 180 tablet 3  . traZODone (DESYREL) 50 MG tablet Take 1 tablet (50 mg total) by mouth at bedtime. 90 tablet 3   No current facility-administered medications on file prior to visit.    BP 140/88   Pulse 96   Temp 97.9 F (36.6 C) (Temporal)   Resp 18   Ht 5\' 7"  (1.702  m)   Wt 148 lb 6.4 oz (67.3 kg)   SpO2 98%   BMI 23.24 kg/m       Objective:   Physical Exam   General Mental Status- Alert. General Appearance- Not in acute distress.   Skin General: Color- Normal Color. Moisture- Normal Moisture.  Neck Carotid Arteries- Normal color. Moisture- Normal Moisture. No carotid bruits. No JVD.  Chest and Lung Exam Auscultation: Breath Sounds:-Normal.  Cardiovascular Auscultation:Rythm- Regular. Murmurs & Other Heart Sounds:Auscultation of the heart reveals- No Murmurs.  Abdomen Inspection:-Inspeection Normal. Palpation/Percussion:Note:No mass. Palpation and Percussion of the abdomen reveal- Non Tender, Non Distended + BS, no rebound or guarding.   Neurologic Cranial Nerve exam:- CN III-XII intact(No nystagmus), symmetric smile. Drift Test:- No drift. Romberg Exam:- mild unsteady on exam. Heal to Toe Gait exam:-mild unsteady on exam. Finger to Nose:- Normal/Intact Strength:- 5/5 equal and symmetric strength both upper and lower extremities.     Assessment & Plan:  For your recent headache, I want you to continue with your Axert and you could add some Tylenol today.  Considered possible use of Toradol today but decided against this since your headache is not severe and you were on brillinta.  I do think I will go ahead and refer you to headache specialist Dr.Ahern.  During the interim also years CT of the head without contrast is authorized by your insurance.  If you have worsening headache with neurologic type signs symptoms and recommend  ED evaluation.  Your blood pressure is borderline elevated.  Continue current medications but will add losartan 25 mg daily dose to your blood pressure medication regimen.  I will see if this might decrease your headaches.  Considering that the Covid vaccine might have played a role in your recent headache as well.  Follow-up in 1 month or as needed.  Please update me in about 2 weeks on the blood pressure readings on with losartan.  30 + minutes spent with pt. 50% of time spend counseling pt on plan going forward.  Mackie Pai, PA-C

## 2019-06-27 NOTE — Patient Instructions (Signed)
For your recent headache, I want you to continue with your Axert and you could add some Tylenol today.  Considered possible use of Toradol today but decided against this since your headache is not severe and you were on brillinta.  I do think I will go ahead and refer you to headache specialist Dr.Ahern.  During the interim also years CT of the head without contrast is authorized by your insurance.  If you have worsening headache with neurologic type signs symptoms and recommend ED evaluation.  Your blood pressure is borderline elevated.  Continue current medications but will add losartan 25 mg daily dose to your blood pressure medication regimen.  I will see if this might decrease your headaches.  Considering that the Covid vaccine might have played a role in your recent headache as well.  Follow-up in 1 month or as needed.  Please update me in about 2 weeks on the blood pressure readings on with losartan.

## 2019-07-09 ENCOUNTER — Telehealth: Payer: Self-pay | Admitting: *Deleted

## 2019-07-09 NOTE — Telephone Encounter (Signed)
Received call from [facility] stating due to multiple mail delays, they just received pt's IFOB specimen from September/October and it will need to be recollected if testing is still needed. Pt will also need to return the specimen directly to our office instead of mailing it back to the lab.

## 2019-07-13 ENCOUNTER — Telehealth: Payer: Self-pay | Admitting: Medical

## 2019-07-13 ENCOUNTER — Telehealth: Payer: Self-pay | Admitting: *Deleted

## 2019-07-13 DIAGNOSIS — D649 Anemia, unspecified: Secondary | ICD-10-CM

## 2019-07-13 DIAGNOSIS — Z1211 Encounter for screening for malignant neoplasm of colon: Secondary | ICD-10-CM

## 2019-07-13 NOTE — Telephone Encounter (Signed)
Ok. Now that you mention waiting a year I remember that. Thanks for calling her.

## 2019-07-13 NOTE — Telephone Encounter (Signed)
Pt had minimal anemia in the past. Ifob was part of work up for that. I put in order again. Will you advise pt and explain about mail issue. But also let her know I put in referral to GI for routine colonoscopy. I looked in her chart and did not see that she had that done yet. Notify pt GI office should be contacting her.

## 2019-07-13 NOTE — Telephone Encounter (Signed)
Future stool cards for blood and gi referral for colonoscopy placed.

## 2019-07-13 NOTE — Telephone Encounter (Signed)
Spoke with pt. She states her insurance told her they will not approve the CT scan of her head as it did not meet medical necessity.

## 2019-07-13 NOTE — Telephone Encounter (Addendum)
Notified pt. She states she just had a stent placed by cardiology in September and they told her to wait a full year before having a colonoscopy from the time of her stent and is declining due to this reason. Pt requests that we mail kit to her as she has great difficulty getting to our office. Will mail kit on Monday but she is aware to drop it off or have someone drop it off for her once she completes testing and she is agreeable.

## 2019-07-13 NOTE — Telephone Encounter (Signed)
Called informed the patient of instructions. She has not received a call from Neurologist office yet. Gave her their number to call to schedule her appt/[phone] The patient verbalized understanding//had no other questions or concerns.

## 2019-07-13 NOTE — Telephone Encounter (Signed)
I had placed the ct of head order but also put in referral to Dr. Lucia Gaskins neurologist. Did they call her? If insurance declined my order they often times will agree to test if specialist recommends which they often do with chronic HA history. So recommend she attend neurologist appointment.   If they did call her when is appointment?  During interim if she every gets severe ha with any severe associated sign/symptoms ED is option. They would not have to get insurance authorization if they think needed.

## 2019-07-16 NOTE — Telephone Encounter (Signed)
IFOB kit mailed to pt and she will let us know if she doesn't receive it in 1 to 2 weeks. If not received by then, pt will need to pick it up in the office. Pt is aware.

## 2019-07-28 ENCOUNTER — Other Ambulatory Visit: Payer: Self-pay | Admitting: Medical

## 2019-08-07 ENCOUNTER — Ambulatory Visit: Payer: Managed Care, Other (non HMO) | Admitting: Neurology

## 2019-08-07 ENCOUNTER — Telehealth: Payer: Self-pay | Admitting: *Deleted

## 2019-08-07 NOTE — Telephone Encounter (Signed)
Pt no showed new patient appt today. This is her 1st no show for [facility] per appt history in chart.

## 2019-08-13 ENCOUNTER — Encounter: Payer: Self-pay | Admitting: Medical

## 2019-08-13 ENCOUNTER — Encounter: Payer: Self-pay | Admitting: Neurology

## 2019-09-05 NOTE — Progress Notes (Deleted)
Cardiology Office Note:   Date:  09/05/2019  NAME:  Carla West    MRN: [id number] DOB:  [birth year]   PCP:  [name], PA-C  Cardiologist:  Reatha Harps, MD  Electrophysiologist:  None   Referring MD: Marisue Brooklyn   No chief complaint on file. ***  History of Present Illness:   Carla West is a 59 y.o. female with a hx of CAD status post non-STEMI in September 2020, hypertension, hypothyroidism who presents for follow-up of CAD.  She had a non-STEMI in September 2020.  Underwent PCI to proximal left circumflex.  She did complain of palpitations at her last visit.  We did proceed with a 7-day Zio patch.  No significant arrhythmias detected.  Problem List: 1. NSTEMI 02/27/2019: PCI to pLCX -T chol 154, HDL 83, LDL 60, TG 49 2. HTN 3. Hyperthyroidism    Past Medical History: Past Medical History:  Diagnosis Date  . Abnormal Pap smear of cervix 08/03/2015  . Benign essential HTN 08/03/2015  . Graves disease   . History of chicken pox   . Migraine headache 08/03/2015    Past Surgical History: Past Surgical History:  Procedure Laterality Date  . CORONARY STENT INTERVENTION N/A 02/27/2019   Procedure: CORONARY STENT INTERVENTION;  Surgeon: Kathleene Hazel, MD;  Location: [facility];  Service: Cardiovascular;  Laterality: N/A;  . LEFT HEART CATH AND CORONARY ANGIOGRAPHY N/A 02/27/2019   Procedure: LEFT HEART CATH AND CORONARY ANGIOGRAPHY;  Surgeon: Kathleene Hazel, MD;  Location: [facility];  Service: Cardiovascular;  Laterality: N/A;  . TUBAL LIGATION      Current Medications: No outpatient medications have been marked as taking for the 09/06/19 encounter (Appointment) with O'Neal, Ronnald Ramp, MD.     Allergies:    Elavil [amitriptyline hcl]   Social History: Social History   Socioeconomic History  . Marital status: Divorced    Spouse name: Not on file  . Number of children: Not on file  . Years of education:  Not on file  . Highest education level: Not on file  Occupational History  . Not on file  Tobacco Use  . Smoking status: Never Smoker  . Smokeless tobacco: Never Used  Substance and Sexual Activity  . Alcohol use: No  . Drug use: No  . 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 Not on file  Social History Narrative  . Not on file   Social Determinants of Health   Financial Resource Strain:   . Difficulty of Paying Living Expenses:   Food Insecurity:   . Worried About Programme researcher, broadcasting/film/video in the Last Year:   . Barista in the Last Year:   Transportation Needs:   . Freight forwarder (Medical):   Marland Kitchen Lack of Transportation (Non-Medical):   Physical Activity:   . Days of Exercise per Week:   . Minutes of Exercise per Session:   Stress:   . Feeling of Stress :   Social Connections:   . Frequency of Communication with Friends and Family:   . Frequency of Social Gatherings with Friends and Family:   . Attends Religious Services:   . Active Member of Clubs or Organizations:   . Attends Banker Meetings:   Marland Kitchen Marital Status:      Family History: The patient's ***family history includes Breast cancer in her maternal  grandmother; Gout in her brother; Heart disease in her brother and mother; Hyperlipidemia in her father and sister; Hypertension in her brother, brother, and mother; Migraines in her son; Other in her brother.  ROS:   All other ROS reviewed and negative. Pertinent positives noted in the HPI.     EKGs/Labs/Other Studies Reviewed:   The following studies were personally reviewed by me today:  EKG:  EKG is *** ordered today.  The ekg ordered today demonstrates ***, and was personally reviewed by me.   Zio Patch 07/18/2019 1. Brief atrial tachycardia episodes. 4 episodes in 5 days. Longest duration 6.2 seconds.  2. No significant arrhythmias detected.   3. Rare ectopy.   LHC 02/27/2019  Prox RCA lesion is 30% stenosed.  Prox Cx lesion is 99% stenosed.  Mid LAD lesion is 20% stenosed.  2nd Mrg lesion is 40% stenosed.  A drug-eluting stent was successfully placed using a STENT RESOLUTE ONYX 3.5X12.  Post intervention, there is a 0% residual stenosis.  1. Mild non-obstructive disease in the LAD and RCA 2. 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  3. Successful PTCA/DES x 1 proximal Circumflex   Echo 02/27/2019 1. The left ventricle has normal systolic function, with an ejection fraction of 60-65%. The cavity size was normal. There is mildly increased left ventricular wall thickness. No evidence of left ventricular regional wall motion abnormalities. 2. The right ventricle has normal systolc function. The cavity was normal. There is no increase in right ventricular wall thickness. 3. Left atrial size was moderately dilated. 4. The mitral valve is abnormal. Mild thickening of the mitral valve leaflet. Mitral valve regurgitation is mild to moderate by color flow Doppler. 5. The aortic valve is tricuspid Mild sclerosis of the aortic valve. 6. The ascending aorta and aortic root are normal in size and structure. 7. The inferior vena cava was normal in size with <50% respiratory variability.    Recent Labs: 01/23/2019: Pro B Natriuretic peptide (BNP) 152.0 02/27/2019: B Natriuretic Peptide 624.9 02/28/2019: Magnesium 1.7 03/14/2019: ALT 43; BUN 15; Creatinine, Ser 0.62; Hemoglobin 11.2; Platelets 318.0; Potassium 4.1; Sodium 141 05/31/2019: TSH <0.01   Recent Lipid Panel    Component Value Date/Time   CHOL 154 04/04/2019 1010   TRIG 49 04/04/2019 1010   HDL 83 04/04/2019 1010   CHOLHDL 1.9 04/04/2019 1010   CHOLHDL 2.4 02/27/2019 0500   VLDL 9 02/27/2019 0500   LDLCALC 60 04/04/2019 1010    Physical Exam:   VS:  There were no vitals taken for  this visit.   Wt Readings from Last 3 Encounters:  06/27/19 148 lb 6.4 oz (67.3 kg)  06/27/19 150 lb (68 kg)  06/26/19 150 lb (68 kg)    General: Well nourished, well developed, in no acute distress Heart: Atraumatic, normal size  Eyes: PEERLA, EOMI  Neck: Supple, no JVD Endocrine: No thryomegaly Cardiac: Normal S1, S2; RRR; no murmurs, rubs, or gallops Lungs: Clear to auscultation bilaterally, no wheezing, rhonchi or rales  Abd: Soft, nontender, no hepatomegaly  Ext: No edema, pulses 2+ Musculoskeletal: No deformities, BUE and BLE strength normal and equal Skin: Warm and dry, no rashes   Neuro: Alert and oriented to person, place, time, and situation, CNII-XII grossly intact, no focal deficits  Psych: Normal mood and affect   ASSESSMENT:   Carla West is a 59 y.o. female who presents for the following: 1. Coronary artery disease involving native  coronary artery of native heart without angina pectoris   2. Essential hypertension   3. Mixed hyperlipidemia     PLAN:   There are no diagnoses linked to this encounter.  Disposition: No follow-ups on file.  Medication Adjustments/Labs and Tests Ordered: Current medicines are reviewed at length with the patient today.  Concerns regarding medicines are outlined above.  No orders of the defined types were placed in this encounter.  No orders of the defined types were placed in this encounter.   There are no Patient Instructions on file for this visit.   Time Spent with Patient: I have spent a total of *** minutes with patient reviewing hospital notes, telemetry, EKGs, labs and examining the patient as well as establishing an assessment and plan that was discussed with the patient.  > 50% of time was spent in direct patient care.  Signed, Addison Naegeli. Audie Box, [facility]  [address], [place], [place] [address] [phone]  09/05/2019 4:54 PM

## 2019-09-06 ENCOUNTER — Ambulatory Visit (INDEPENDENT_AMBULATORY_CARE_PROVIDER_SITE_OTHER): Payer: Managed Care, Other (non HMO) | Admitting: Cardiovascular Disease

## 2019-09-06 ENCOUNTER — Encounter: Payer: Self-pay | Admitting: Cardiovascular Disease

## 2019-09-06 ENCOUNTER — Other Ambulatory Visit: Payer: Self-pay

## 2019-09-06 ENCOUNTER — Ambulatory Visit: Payer: Managed Care, Other (non HMO) | Admitting: Cardiovascular Disease

## 2019-09-06 VITALS — BP 138/90 | HR 88 | Ht 67.0 in | Wt 157.0 lb

## 2019-09-06 DIAGNOSIS — R5383 Other fatigue: Secondary | ICD-10-CM | POA: Diagnosis not present

## 2019-09-06 DIAGNOSIS — F5101 Primary insomnia: Secondary | ICD-10-CM

## 2019-09-06 DIAGNOSIS — E782 Mixed hyperlipidemia: Secondary | ICD-10-CM

## 2019-09-06 DIAGNOSIS — I2510 Atherosclerotic heart disease of native coronary artery without angina pectoris: Secondary | ICD-10-CM | POA: Diagnosis not present

## 2019-09-06 DIAGNOSIS — I10 Essential (primary) hypertension: Secondary | ICD-10-CM | POA: Diagnosis not present

## 2019-09-06 MED ORDER — TRAZODONE HCL 100 MG PO TABS: 100.0000 mg | ORAL_TABLET | Freq: Every day | ORAL | 1 refills | Status: DC

## 2019-09-06 NOTE — Progress Notes (Signed)
Cardiology Office Note:   Date:  09/06/2019  NAME:  Carla West    MRN: [id number] DOB:  Feb 17, 1961   PCP:  Mackie Pai, PA-C  Cardiologist:  Evalina Field, MD  Electrophysiologist:  None   Referring MD: Elise Benne   Chief Complaint  Patient presents with  . Follow-up    3 months.   History of Present Illness:   Carla West is a 59 y.o. female with a hx of hypothyroidism, CAD status post PCI to proximal left circumflex in the setting of non-STEMI, hypertension who presents for follow-up of CAD.  She remains on aspirin and ticagrelor.  No bleeding reported.  There are plans for colonoscopy but I advised her to wait until she completes 1 year of DAPT.  She reports for the last several months has had intermittent muscle cramps.  She also reports continued issues with insomnia.  We did try her on trazodone but she is noticed no improvement.  She also reports that she is chronically fatigued and has little desire to exercise or do much more than go to work.  She reports she has difficulty concentrating as well.  I did inquire about possible stress and anxiety.  She does report there is some of this in her life.  She works in Press [employer].  She also works she possibly could be depressed.  Apparently her endocrinologist will manage this with medications.  They have no plans for thyroid ablation.  She is still having intermittent episodes of her heart racing.  This occurs every few days.  I did discuss with her that her recent heart monitor was normal.  She has been anemic in the past and I think is a good idea to obtain a CBC today.  I also informed her given her cramps will obtain a BMP to look at her potassium.  She does report some intermittent shortness of breath and I did inform her this could be related to ticagrelor.  For now she wants to maintain this therapy and see her endocrinologist to determine if this is related to her hyperthyroid state.  She denies any bleeding or overt  issues with her current medications.  Her blood pressure is well controlled today.  She denies chest pain or symptoms of angina today.  Problem List: 1. NSTEMI 02/27/2019: PCI to [place] 2. HTN 3. Hyperthyroidism   Past Medical History: Past Medical History:  Diagnosis Date  . Abnormal Pap smear of cervix 08/03/2015  . Benign essential HTN 08/03/2015  . Graves disease   . History of chicken pox   . Migraine headache 08/03/2015    Past Surgical History: Past Surgical History:  Procedure Laterality Date  . CORONARY STENT INTERVENTION N/A 02/27/2019   Procedure: CORONARY STENT INTERVENTION;  Surgeon: Burnell Blanks, MD;  Location: [facility];  Service: Cardiovascular;  Laterality: N/A;  . LEFT HEART CATH AND CORONARY ANGIOGRAPHY N/A 02/27/2019   Procedure: LEFT HEART CATH AND CORONARY ANGIOGRAPHY;  Surgeon: Burnell Blanks, MD;  Location: [facility];  Service: Cardiovascular;  Laterality: N/A;  . TUBAL LIGATION      Current Medications: Current Meds  Medication Sig  . almotriptan (AXERT) 12.5 MG tablet Take 1 tablet by mouth twice daily as needed for migraine. May repeat in 2 hours if needed  . amitriptyline (ELAVIL) 10 MG tablet TAKE 1 TABLET(10 MG) BY MOUTH AT BEDTIME  . amLODipine (NORVASC) 10 MG tablet Take 1 tablet (10 mg total) by mouth daily.  Marland Kitchen  aspirin EC 81 MG tablet Take 1 tablet (81 mg total) by mouth daily.  Marland Kitchen atorvastatin (LIPITOR) 80 MG tablet Take 1 tablet (80 mg total) by mouth daily at 6 PM.  . dicyclomine (BENTYL) 20 MG tablet Take 1 tablet (20 mg total) by mouth 4 (four) times daily -  before meals and at bedtime. (Patient taking differently: Take 20 mg by mouth 2 (two) times daily after a meal. )  . losartan (COZAAR) 25 MG tablet Take 1 tablet (25 mg total) by mouth daily.  . methimazole (TAPAZOLE) 10 MG tablet Take 10 mg in a.m. and 10 mg in the evening, with meals  . metoprolol succinate (TOPROL-XL) 100 MG 24 hr tablet Take 1 tablet  (100 mg total) by mouth daily. Take with or immediately following a meal.  . promethazine (PHENERGAN) 25 MG tablet Take 1 tablet (25 mg total) by mouth every 6 (six) hours as needed for nausea or vomiting.  . sodium chloride (OCEAN) 0.65 % SOLN nasal spray Place 1 spray into both nostrils 2 (two) times daily as needed for congestion.  . ticagrelor (BRILINTA) 90 MG TABS tablet Take 1 tablet (90 mg total) by mouth 2 (two) times daily.  . traZODone (DESYREL) 100 MG tablet Take 1 tablet (100 mg total) by mouth at bedtime.  . [DISCONTINUED] traZODone (DESYREL) 50 MG tablet Take 1 tablet (50 mg total) by mouth at bedtime.     Allergies:    Elavil [amitriptyline hcl]   Social History: Social History   Socioeconomic History  . Marital status: Divorced    Spouse name: Not on file  . Number of children: Not on file  . Years of education: Not on file  . Highest education level: Not on file  Occupational History  . Not on file  Tobacco Use  . Smoking status: Never Smoker  . Smokeless tobacco: Never Used  Substance and Sexual Activity  . Alcohol use: No  . Drug use: No  . 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 Not on file  Social History Narrative  . Not on file   Social Determinants of Health   Financial Resource Strain:   . Difficulty of Paying Living Expenses:   Food Insecurity:   . Worried About Programme researcher, broadcasting/film/video in the Last Year:   . Barista in the Last Year:   Transportation Needs:   . Freight forwarder (Medical):   Marland Kitchen Lack of Transportation (Non-Medical):   Physical Activity:   . Days of Exercise per Week:   . Minutes of Exercise per Session:   Stress:   . Feeling of Stress :   Social Connections:   . Frequency of Communication with Friends and Family:   . Frequency of Social Gatherings with Friends and Family:   . Attends Religious Services:   . Active  Member of Clubs or Organizations:   . Attends Banker Meetings:   Marland Kitchen Marital Status:      Family History: The patient's family history includes Breast cancer in her maternal grandmother; Gout in her brother; Heart disease in her brother and mother; Hyperlipidemia in her father and sister; Hypertension in her brother, brother, and mother; Migraines in her son; Other in her brother.  ROS:   All other ROS reviewed and negative. Pertinent positives noted in the HPI.     EKGs/Labs/Other Studies Reviewed:   The  following studies were personally reviewed by me today:  Recent Labs: 01/23/2019: Pro B Natriuretic peptide (BNP) 152.0 02/27/2019: B Natriuretic Peptide 624.9 02/28/2019: Magnesium 1.7 03/14/2019: ALT 43; BUN 15; Creatinine, Ser 0.62; Hemoglobin 11.2; Platelets 318.0; Potassium 4.1; Sodium 141 05/31/2019: TSH <0.01   Recent Lipid Panel    Component Value Date/Time   CHOL 154 04/04/2019 1010   TRIG 49 04/04/2019 1010   HDL 83 04/04/2019 1010   CHOLHDL 1.9 04/04/2019 1010   CHOLHDL 2.4 02/27/2019 0500   VLDL 9 02/27/2019 0500   LDLCALC 60 04/04/2019 1010    Physical Exam:   VS:  BP 138/90 (BP Location: Left Arm, Patient Position: Sitting, Cuff Size: Normal)   Pulse 88   Ht 5\' 7"  (1.702 m)   Wt 157 lb (71.2 kg)   BMI 24.59 kg/m    Wt Readings from Last 3 Encounters:  09/06/19 157 lb (71.2 kg)  06/27/19 148 lb 6.4 oz (67.3 kg)  06/27/19 150 lb (68 kg)    General: Well nourished, well developed, in no acute distress Heart: Atraumatic, normal size  Eyes: PEERLA, EOMI  Neck: Supple, no JVD Endocrine: No thryomegaly Cardiac: Normal S1, S2; RRR; no murmurs, rubs, or gallops Lungs: Clear to auscultation bilaterally, no wheezing, rhonchi or rales  Abd: Soft, nontender, no hepatomegaly  Ext: No edema, pulses 2+ Musculoskeletal: No deformities, BUE and BLE strength normal and equal Skin: Warm and dry, no rashes   Neuro: Alert and oriented to person, place, time,  and situation, CNII-XII grossly intact, no focal deficits  Psych: Normal mood and affect   ASSESSMENT:   Amberlynn Tempesta is a 59 y.o. female who presents for the following: 1. Coronary artery disease involving native coronary artery of native heart without angina pectoris   2. Essential hypertension   3. Mixed hyperlipidemia   4. Fatigue, unspecified type   5. Primary insomnia     PLAN:   1. Coronary artery disease involving native coronary artery of native heart without angina pectoris -Status post non-STEMI 02/2019.  PCI to proximal left circumflex.  Tolerating aspirin ticagrelor well.  No bleeding.  No symptoms of angina today.  She will continue current regimen and we will complete 1 year of DAPT.  I advised her to avoid any elective procedures in the meantime.  Should we need to run into her procedure it would be okay to stop DAPT. -I also informed her that her shortness of breath or symptoms of fatigue could be related to ticagrelor.  She wants to see her endocrinologist before we make any changes to her medications -She will continue metoprolol succinate 100 mg daily.  Blood pressure well controlled.  No angina. -Most recent LDL at goal.  We will continue her Lipitor.  2. Essential hypertension -Blood pressure well controlled today.  No change in medication.  3. Mixed hyperlipidemia -Most recent LDL at goal.  Continue Lipitor.  4. Fatigue, unspecified type -We will check a CBC to ensure she is not anemic.  She has had a problem this in the past.  This could be worsening her symptoms of fatigue.  We will also check a BMP given the issue of muscle cramping.  5. Primary insomnia -She still reports issues with insomnia.  We will increase her trazodone to 100 mg daily.  Disposition: Return in about 3 months (around 12/07/2019).  Medication Adjustments/Labs and Tests Ordered: Current medicines are reviewed at length with the patient today.  Concerns regarding medicines are outlined  above.  Orders  Placed This Encounter  Procedures  . CBC  . Basic metabolic panel   Meds ordered this encounter  Medications  . traZODone (DESYREL) 100 MG tablet    Sig: Take 1 tablet (100 mg total) by mouth at bedtime.    Dispense:  90 tablet    Refill:  1    Patient Instructions  Medication Instructions:  Increase Trazodone to 100 mg at night.  *If you need a refill on your cardiac medications before your next appointment, please call your pharmacy*   Lab Work: CBC, BMET today   If you have labs (blood work) drawn today and your tests are completely normal, you will receive your results only by: Marland Kitchen MyChart Message (if you have MyChart) OR . A paper copy in the mail If you have any lab test that is abnormal or we need to change your treatment, we will call you to review the results.   Follow-Up: At [facility], you and your health needs are our priority.  As part of our continuing mission to provide you with exceptional heart care, we have created designated Provider Care Teams.  These Care Teams include your primary Cardiologist (physician) and Advanced Practice Providers (APPs -  Physician Assistants and Nurse Practitioners) who all work together to provide you with the care you need, when you need it.  We recommend signing up for the patient portal called "MyChart".  Sign up information is provided on this After Visit Summary.  MyChart is used to connect with patients for Virtual Visits (Telemedicine).  Patients are able to view lab/test results, encounter notes, upcoming appointments, etc.  Non-urgent messages can be sent to your provider as well.   To learn more about what you can do with MyChart, go to [email or web address].    Your next appointment:   3 month(s)  The format for your next appointment:   In Person  Provider:   Lennie Odor, MD         Time Spent with Patient: I have spent a total of 35 minutes with patient reviewing hospital notes,  telemetry, EKGs, labs and examining the patient as well as establishing an assessment and plan that was discussed with the patient.  > 50% of time was spent in direct patient care.  Signed, Lenna Gilford. Flora Lipps, MD [facility]  [address] [place], [place] [address] [phone]  09/06/2019 5:02 PM

## 2019-09-06 NOTE — Patient Instructions (Addendum)
Medication Instructions:  Increase Trazodone to 100 mg at night.  *If you need a refill on your cardiac medications before your next appointment, please call your pharmacy*   Lab Work: CBC, BMET today   If you have labs (blood work) drawn today and your tests are completely normal, you will receive your results only by: Marland Kitchen MyChart Message (if you have MyChart) OR . A paper copy in the mail If you have any lab test that is abnormal or we need to change your treatment, we will call you to review the results.   Follow-Up: At [facility], you and your health needs are our priority.  As part of our continuing mission to provide you with exceptional heart care, we have created designated Provider Care Teams.  These Care Teams include your primary Cardiologist (physician) and Advanced Practice Providers (APPs -  Physician Assistants and Nurse Practitioners) who all work together to provide you with the care you need, when you need it.  We recommend signing up for the patient portal called "MyChart".  Sign up information is provided on this After Visit Summary.  MyChart is used to connect with patients for Virtual Visits (Telemedicine).  Patients are able to view lab/test results, encounter notes, upcoming appointments, etc.  Non-urgent messages can be sent to your provider as well.   To learn more about what you can do with MyChart, go to [email or web address].    Your next appointment:   3 month(s)  The format for your next appointment:   In Person  Provider:   Lennie Odor, MD

## 2019-09-07 LAB — CBC
Hematocrit: 37.7 % (ref 34.0–46.6)
Hemoglobin: 12.7 g/dL (ref 11.1–15.9)
MCH: 28.9 pg (ref 26.6–33.0)
MCHC: 33.7 g/dL (ref 31.5–35.7)
MCV: 86 fL (ref 79–97)
Platelets: 231 10*3/uL (ref 150–450)
RBC: 4.39 x10E6/uL (ref 3.77–5.28)
RDW: 13.7 % (ref 11.7–15.4)
WBC: 4.4 10*3/uL (ref 3.4–10.8)

## 2019-09-07 LAB — BASIC METABOLIC PANEL
BUN/Creatinine Ratio: 18 (ref 9–23)
BUN: 18 mg/dL (ref 6–24)
CO2: 21 mmol/L (ref 20–29)
Calcium: 9.1 mg/dL (ref 8.7–10.2)
Chloride: 105 mmol/L (ref 96–106)
Creatinine, Ser: 0.99 mg/dL (ref 0.57–1.00)
GFR calc Af Amer: 74 mL/min/{1.73_m2} (ref >59)
GFR calc non Af Amer: 64 mL/min/{1.73_m2} (ref >59)
Glucose: 83 mg/dL (ref 65–99)
Potassium: 3.7 mmol/L (ref 3.5–5.2)
Sodium: 143 mmol/L (ref 134–144)

## 2019-09-13 ENCOUNTER — Encounter: Payer: Self-pay | Admitting: Internal Medicine

## 2019-09-17 NOTE — Telephone Encounter (Signed)
I spoke with Unknown Foley and got her scheduled for Wednesday September 19, 2019 at 10:40 AM  - Natalia Leatherwood

## 2019-09-19 ENCOUNTER — Other Ambulatory Visit: Payer: Self-pay

## 2019-09-19 ENCOUNTER — Ambulatory Visit (INDEPENDENT_AMBULATORY_CARE_PROVIDER_SITE_OTHER): Payer: Managed Care, Other (non HMO) | Admitting: Internal Medicine

## 2019-09-19 ENCOUNTER — Encounter: Payer: Self-pay | Admitting: Internal Medicine

## 2019-09-19 VITALS — BP 138/90 | HR 54 | Wt 155.6 lb

## 2019-09-19 DIAGNOSIS — E0500 Thyrotoxicosis with diffuse goiter without thyrotoxic crisis or storm: Secondary | ICD-10-CM | POA: Diagnosis not present

## 2019-09-19 LAB — T4, FREE: Free T4: 0.8 ng/dL (ref 0.60–1.60)

## 2019-09-19 LAB — TSH: TSH: 0.28 u[IU]/mL — ABNORMAL LOW (ref 0.35–4.50)

## 2019-09-19 LAB — T3, FREE: T3, Free: 3.6 pg/mL (ref 2.3–4.2)

## 2019-09-19 NOTE — Progress Notes (Signed)
Patient ID: Carla West, female   DOB: 19-Nov-1960, 59 y.o.   MRN: [id number]   This visit occurred during the SARS-CoV-2 public health emergency.  Safety protocols were in place, including screening questions prior to the visit, additional usage of staff PPE, and extensive cleaning of exam room while observing appropriate contact time as indicated for disinfecting solutions.   HPI  Carla West is a 59 y.o.-year-old female, initially referred by her PCP, Melina Schools, PA-C, returning for follow-up for thyrotoxicosis, diagnosed as Graves' disease after last visit.  Last visit 6 months ago.  Reviewed and addended history:  She had Covid19 in 08/2018 - fatigue, palpitation, diarrhea, 15 lbs weight loss.   Patient was found to have abnormal TFTs in 11/2017.  TSH was suppressed but free thyroid hormones were normal until 01/2020 when free T4 returned elevated.  She was referred to endocrinology.  She continues on metoprolol 100 mg daily.  Patient was seen in consultation for thyrotoxicosis on 02/26/2019.  At that time we checked her TFTs and Graves' antibodies.  Before the labs returned, patient was admitted to the hospital the same day with NSTEMI.  She had cardiac catheterization with diagnosis of CAD and had PCI with DES x1.  While in the hospital, I discussed with the admitting physician and we started methimazole 5 mg daily.  Afterwards I sent her a message to increase the dose to 10 mg twice a day.  However, she misunderstood the instructions and she was only taking it once a day...  We increased it to twice a day.  In 03/2019, her TSH is still suppressed with high free thyroid hormones so I advised her to increase her methimazole to 10 mg in a.m. and 20 mg in p.m.  However, she did not read the MyChart message until 05/2019 -started the above dose at that time.  Now on MMI 10 mg BID since 05/2019.  Reviewed her TFTs: Lab Results  Component Value Date   TSH <0.01 (L) 05/31/2019    TSH <0.01 (L) 04/06/2019   TSH <0.010 (L) 02/27/2019   TSH <0.01 (L) 02/26/2019   TSH <0.01 (L) 01/23/2019   TSH <0.01 (L) 04/11/2018   TSH <0.01 (L) 02/28/2018   TSH <0.01 (L) 12/06/2017   TSH 0.54 08/06/2016   FREET4 0.72 05/31/2019   FREET4 4.68 (H) 04/06/2019   FREET4 5.34 (H) 02/27/2019   FREET4 4.84 (H) 02/26/2019   FREET4 4.77 (H) 01/24/2019   FREET4 1.07 04/11/2018   FREET4 1.17 02/28/2018   FREET4 1.46 12/06/2017   T3FREE 3.2 05/31/2019   T3FREE 20.7 (H) 04/06/2019   T3FREE 18.8 (H) 02/26/2019   T3FREE 3.5 02/28/2018   Her antithyroid antibodies were elevated, pointing towards Graves' disease: Lab Results  Component Value Date   TSI 467 (H) 02/26/2019   When I first saw her, she mentioned - + fatigue - + excessive sweating/heat intolerance- postmenopausal - + tremors - + anxiety - + palpitations-getting worse - + hyperdefecation - + weight loss- 15 pounds around the time of her COVID-19 diagnosis at the beginning of the year - + insomnia - + hair loss - + SOB with exertion At last visit, she had fatigue, not sleeping well, and diarrhea, but the rest of the symptoms are much improved.  Now: - + fatigue, not sleeping well - + weight gain - + bloated feeling - + mm cramps - + palpitations - + SOB  Pt denies: - feeling nodules in neck - hoarseness - dysphagia -  choking - SOB with lying down  Pt does have a FH of thyroid ds. In MGF. No FH of thyroid cancer. No h/o radiation tx to head or neck.  No seaweed or kelp. No recent contrast studies. No herbal supplements. No Biotin use. No recent steroids use.   She has a history of migraines.  ROS: Constitutional: no weight gain/no weight loss, no fatigue, no subjective hyperthermia, no subjective hypothermia Eyes: no blurry vision, no xerophthalmia ENT: no sore throat, + see HPI Cardiovascular: no CP/+ SOB/+ palpitations/no leg swelling Respiratory: no cough/+ SOB/no wheezing Gastrointestinal: no N/no  V/no D/no C/no acid reflux Musculoskeletal: no muscle aches/no joint aches Skin: no rashes, no hair loss Neurological: + Tremors/no numbness/no tingling/no dizziness  I reviewed pt's medications, allergies, PMH, social hx, family hx, and changes were documented in the history of present illness. Otherwise, unchanged from my initial visit note.  Past Medical History:  Diagnosis Date  . Abnormal Pap smear of cervix 08/03/2015  . Benign essential HTN 08/03/2015  . Graves disease   . History of chicken pox   . Migraine headache 08/03/2015   Past Surgical History:  Procedure Laterality Date  . CORONARY STENT INTERVENTION N/A 02/27/2019   Procedure: CORONARY STENT INTERVENTION;  Surgeon: Burnell Blanks, MD;  Location: [facility];  Service: Cardiovascular;  Laterality: N/A;  . LEFT HEART CATH AND CORONARY ANGIOGRAPHY N/A 02/27/2019   Procedure: LEFT HEART CATH AND CORONARY ANGIOGRAPHY;  Surgeon: Burnell Blanks, MD;  Location: [facility];  Service: Cardiovascular;  Laterality: N/A;  . TUBAL LIGATION     Social History   Socioeconomic History  . Marital status: Divorced    Spouse name: Not on file  . Number of children: Not on file  . Years of education: Not on file  . Highest education level: Not on file  Occupational History  . Not on file  Tobacco Use  . Smoking status: Never Smoker  . Smokeless tobacco: Never Used  Substance and Sexual Activity  . Alcohol use: No  . Drug use: No  . 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 Not on file  Social History Narrative  . Not on file   Social Determinants of Health   Financial Resource Strain:   . Difficulty of Paying Living Expenses:   Food Insecurity:   . Worried About Charity fundraiser in the Last Year:   . Arboriculturist in the Last Year:   Transportation Needs:   . Film/video editor  (Medical):   Marland Kitchen Lack of Transportation (Non-Medical):   Physical Activity:   . Days of Exercise per Week:   . Minutes of Exercise per Session:   Stress:   . Feeling of Stress :   Social Connections:   . Frequency of Communication with Friends and Family:   . Frequency of Social Gatherings with Friends and Family:   . Attends Religious Services:   . Active Member of Clubs or Organizations:   . Attends Archivist Meetings:   Marland Kitchen Marital Status:   Intimate Partner Violence:   . Fear of Current or Ex-Partner:   . Emotionally Abused:   Marland Kitchen Physically Abused:   . Sexually Abused:    Current Outpatient Medications on File Prior to Visit  Medication Sig Dispense Refill  . almotriptan (AXERT) 12.5 MG tablet Take 1 tablet by mouth twice  daily as needed for migraine. May repeat in 2 hours if needed 20 tablet 0  . amitriptyline (ELAVIL) 10 MG tablet TAKE 1 TABLET(10 MG) BY MOUTH AT BEDTIME 30 tablet 0  . amLODipine (NORVASC) 10 MG tablet Take 1 tablet (10 mg total) by mouth daily. 30 tablet 5  . aspirin EC 81 MG tablet Take 1 tablet (81 mg total) by mouth daily. 90 tablet 3  . atorvastatin (LIPITOR) 80 MG tablet Take 1 tablet (80 mg total) by mouth daily at 6 PM. 90 tablet 3  . dicyclomine (BENTYL) 20 MG tablet Take 1 tablet (20 mg total) by mouth 4 (four) times daily -  before meals and at bedtime. (Patient taking differently: Take 20 mg by mouth 2 (two) times daily after a meal. ) 120 tablet 3  . losartan (COZAAR) 25 MG tablet Take 1 tablet (25 mg total) by mouth daily. 30 tablet 3  . methimazole (TAPAZOLE) 10 MG tablet Take 10 mg in a.m. and 10 mg in the evening, with meals 90 tablet 2  . metoprolol succinate (TOPROL-XL) 100 MG 24 hr tablet Take 1 tablet (100 mg total) by mouth daily. Take with or immediately following a meal. 90 tablet 3  . promethazine (PHENERGAN) 25 MG tablet Take 1 tablet (25 mg total) by mouth every 6 (six) hours as needed for nausea or vomiting. 30 tablet 0  .  sodium chloride (OCEAN) 0.65 % SOLN nasal spray Place 1 spray into both nostrils 2 (two) times daily as needed for congestion. 30 mL 6  . ticagrelor (BRILINTA) 90 MG TABS tablet Take 1 tablet (90 mg total) by mouth 2 (two) times daily. 180 tablet 3  . traZODone (DESYREL) 100 MG tablet Take 1 tablet (100 mg total) by mouth at bedtime. 90 tablet 1   No current facility-administered medications on file prior to visit.   Allergies  Allergen Reactions  . Elavil [Amitriptyline Hcl] Other (See Comments)    Jittery..the patient states she has not had any side effects    Family History  Problem Relation Age of Onset  . Hypertension Mother   . Heart disease Mother   . Hyperlipidemia Father   . Breast cancer Maternal Grandmother   . Hyperlipidemia Sister   . Other Brother        plan crash in [employer]  . Migraines Son   . Heart disease Brother        CHF, arrythmia  . Hypertension Brother   . Gout Brother   . Hypertension Brother     PE: BP 138/90 (BP Location: Right Arm, Patient Position: Sitting, Cuff Size: Normal)   Pulse (!) 54   Wt 155 lb 9.6 oz (70.6 kg)   SpO2 98%   BMI 24.37 kg/m  Wt Readings from Last 3 Encounters:  09/19/19 155 lb 9.6 oz (70.6 kg)  09/06/19 157 lb (71.2 kg)  06/27/19 148 lb 6.4 oz (67.3 kg)   Constitutional: normal weight, in NAD Eyes: PERRLA, EOMI, no exophthalmos ENT: moist mucous membranes, + mild symmetric thyromegaly, no cervical lymphadenopathy Cardiovascular: RRR, No MRG Respiratory: CTA B Gastrointestinal: abdomen soft, NT, ND, BS+ Musculoskeletal: no deformities, strength intact in all 4 Skin: moist, warm, no rashes Neurological: + Tremor with outstretched hands, DTR normal in all 4  ASSESSMENT: 1.  Graves' disease  PLAN:  1. Patient with history of undetectable TSH in 11/2017, with diagnosis of clinical, symptomatic, thyrotoxicosis confirmed as Graves' disease after TSI antibodies returned elevated.  Therefore, we skipped  the thyroid  uptake and scan. -Of note, she had multiple thyrotoxicosis symptoms initially: Weight loss, heat intolerance, hyper defecation, tremors, palpitations, anxiety and was even admitted with an NSTEMI later in the day of her first appointment.  Cardiac catheterization showed CAD and she had a stent placed.  At her first visit, we did start methimazole and this was increased after her hospitalization.  At last visit, I advised her to increase it further to 10 mg in a.m. and 20 mg in p.m. but she did not read the message until 05/2019... And at that time, I advised him to continue 10 mg of methimazole twice a day. -She contacted me recently that she was not feeling well.  She continues to have palpitations and shortness of breath, tremors, and also fatigue and some weight gain  -At this visit, we again discussed about further treatment for her Graves' disease as methimazole does not proved to be sufficient.  I strongly recommend RAI treatment.  I would not necessarily consider thyroidectomy for now.  She agrees with RAI treatment. -In this case, we will go ahead with a thyroid uptake and scan and then RAI treatment if the TFTs are still thyrotoxic.  We discussed about possible outcomes of the RAI treatment to include hypothyroidism (most commonly, euthyroidism, or even persistent hyperthyroidism.  We discussed that she will require levothyroxine treatment most likely after the RAI treatment.  -For now, we will check her TFTs and adjust the methimazole dose accordingly and we plan to stop the methimazole after RAI treatment -She continues on metoprolol. HR normal. No palpitations -No signs of Graves' ophthalmopathy: No blurry vision, double vision, eye pain, chemosis -I will see her back in 3 months  -Total time spent for the appointment: 25 minutes, in obtaining information about her symptoms, reviewing her previous labs, evaluations, and treatments, counseling her about her condition (please see the discussed  topics above), and developing a plan to further investigate and treat it -discussed the possible outcomes of different treatments; she had a number of questions which I addressed.  Component     Latest Ref Rng & Units 09/19/2019  TSH     0.35 - 4.50 uIU/mL 0.28 (L)  T4,Free(Direct)     0.60 - 1.60 ng/dL 1.60  Triiodothyronine,Free,Serum     2.3 - 4.2 pg/mL 3.6   Thyroid tests are improved.  At this point, we can try to reduce the dose of methimazole to 10 mg in a.m. and 5 mg in p.m. and recheck her test in one 1 month or proceed with the plan for RAI treatment.  I will let the patient decide.  [name], MD PhD [facility]

## 2019-09-19 NOTE — Patient Instructions (Addendum)
Please continue methimazole 10 mg in a.m. and 10 mg in p.m.  We will order a thyroid uptake and scan to be done at [facility].  In case you are not called to schedule this within the next few days, this is their telephone number: [phone].  Please stop methimazole 4 days prior to the uptake and scan and resume it immediately afterwards.  Please stop methimazole 4 days prior to the RAI treatment and do not resume it afterwards.  Come back for labs 4 to 5 weeks after your RAI treatment.  Come back for a visit in 3 months.   Radioiodine (I-131) Therapy for Hyperthyroidism Radioiodine (I-131) therapy is a treatment for an overactive thyroid gland (hyperthyroidism). The thyroid is a gland in the neck that uses iodine to help control how the body uses food (metabolism). This treatment involves swallowing a pill or liquid that contains I-131. I-131 is manufactured (synthetic) iodine that gives off radiation. After it is swallowed, the I-131 will be absorbed by the thyroid gland over the next few months. It will destroy thyroid cells and reverse hyperthyroidism. Tell a health care provider about:  Any allergies you have.  All medicines you are taking, including vitamins, herbs, eye drops, creams, and over-the-counter medicines.  Any blood disorders you have.  Any surgeries you have had.  Any medical conditions you have.  Whether you are pregnant, may be pregnant, or have gone through menopause, if this applies.  Whether you currently have children.  Whether you are breastfeeding.  Whether you plan to have children in the next 2 years.  Any contact you have with children or pregnant women.  Your travel plans for the next 3 months.  Whether you pass through radiation detectors for work or travel. What are the risks? Generally, this is a safe procedure. However, problems may occur, including:  Damage to other structures or organs, such as the salivary glands. This could lead to  dry mouth and loss of taste.  Low sperm count, if this applies. This may lead to temporary infertility.  Sore throat or neck pain. This is temporary.  Slightly increased risk of thyroid cancer.  Nausea or vomiting. What happens before the procedure? Staying hydrated  Follow instructions from your health care provider about hydration, which may include: ? Up to 2 hours before the procedure - you may continue to drink clear liquids, such as water, clear fruit juice, black coffee, and plain tea. Eating and drinking restrictions  Follow instructions from your health care provider about eating and drinking restrictions.  Follow a low-iodine diet as told by your health care provider. Check ingredients on packaged foods and drinks because there are foods that you will need to avoid while on the low-iodine diet: ? Avoid iodized table salt and foods that have iodized salt. ? Avoid seafood, seaweed, soybeans, and soy products. ? Avoid dairy products and eggs. ? Avoid the food dye Red No. 3 because it has iodine. Medicines   Ask your health care provider about: ? Changing or stopping your regular medicines. This is especially important if you are taking diabetes medicines, blood thinners, or thyroid medicines. ? Taking over-the-counter medicines, vitamins, herbs, and supplements. General instructions  Women may be asked to take a pregnancy test.  Women who are breastfeeding should: ? Plan to stop at least 6 weeks before the procedure. ? Not go back to breastfeeding after the procedure until their health care provider approves.  Plan to avoid contact with other people for  1 week after your treatment. Avoiding contact with children and pregnant women is especially important. To do this, plan to stay home from work, arrange child care, and sleep alone, if these things apply to you.  Plan to drive yourself home after treatment. Do not take public transportation. If you need someone to drive  you home, sit as far away from the driver as possible. What happens during the procedure?  You will be given a dose of I-131 to swallow. It may be a pill or a liquid.  Your thyroid gland will absorb the I-131 over the next 3 months. The treatment process will be complete in about 6 months. What happens after the procedure?  You may need to stay in the hospital for 24 hours after your treatment. This depends on the requirements in your state.  Follow instructions from your health care provider about: ? How to take care of yourself after the procedure. ? How to protect others from exposure to radiation as it leaves your body. Summary  Radioiodine (I-131) therapy is a treatment for an overactive thyroid gland (hyperthyroidism).  This treatment involves swallowing a pill or liquid that contains I-131. I-131 is manufactured iodine that gives off radiation.  Your thyroid gland will absorb the I-131 over the next 3 months. The I-131 destroys thyroid cells and reverses hyperthyroidism.  Follow instructions from your health care provider about how to take care of yourself and how to protect other people from exposure to radiation after the procedure. This information is not intended to replace advice given to you by your health care provider. Make sure you discuss any questions you have with your health care provider. Document Revised: 07/13/2018 Document Reviewed: 07/13/2018 Elsevier Patient Education  [place].   Radioiodine (I-131) Therapy for Hyperthyroidism, Care After This sheet gives you information about how to care for yourself after your procedure. Your health care provider may also give you more specific instructions. If you have problems or questions, contact your health care provider. What can I expect after the procedure? After the procedure, it is common to have a sore throat or mild neck pain for several months. Follow these instructions at home: For the first 48  hours after the procedure:   Do not use public bathrooms.  Flush twice after using the toilet. Use tissue to wipe up any urine on the toilet seat. Men should sit down to urinate to reduce the risk of splashing.  Take a bath or shower every day.  Rinse the sink and tub after each use.  Do not make food for other people.  Wash your sheets, towels, and clothes each day. Wash them separately from other people's items.  Drink enough fluid to keep your urine pale yellow. Pregnancy and breastfeeding  Do not try to become pregnant for as long as you are told by your health care provider. This may be for up to 1 year after your procedure. Use a method of birth control (contraception) to prevent pregnancy. Talk to your health care provider about what form of contraception is right for you.  Do not breastfeed for as long as you are told by your health care provider, if this applies. General instructions   Avoid close contact with other people. Avoiding contact with children and pregnant women is especially important. Do this for 1 week after your procedure. ? Try to keep a distance of about 6 feet (1.8 m) from others. ? Sleep alone. ? Do not have close intimate  contact of any kind. This includes kissing, physical contact, and sexual intercourse. ? If you have children, arrange for child care. ? Do not ride in vehicles with other people. ? Do not stay in a hotel. ? Stay home from work or school as told by your health care provider.  Take over-the-counter and prescription medicines only as told by your health care provider. This includes any thyroid medicines.  When your health care provider tells you it is safe to travel, carry a note from the health care provider to explain that you have had radioiodine therapy. This is needed because radioactivity may set off detectors in airports or other places.  Do not share utensils--such as silverware, plates, or cups--for as long as told by your  health care provider. Use disposable utensils, or clean your utensils separately from those of others.  Wash your hands often. If soap and water are not available, use hand sanitizer.  Keep all follow-up visits as told by your health care provider. This is important. Follow-up visits may be required every 1-2 months after treatment. Contact a health care provider if you:  Have pain that gets worse or does not get better with medicine.  Have a dry mouth.  Lose your sense of taste.  Become pregnant within 1 year of your procedure, if this applies.  Feel unusually tired (fatigued).  Have very dry skin.  Start to lose your hair.  Have bowel movements that are less frequent or more difficult than usual (constipation).  Have unexplained weight gain.  Always feel cold. Summary  After the procedure, it is common to have a sore throat or mild neck pain for several months.  Do not try to become pregnant for as long as you are told by your health care provider.  Avoid close contact with other people. Avoiding contact with children and pregnant women is especially important. Do this for 1 week after your procedure.  Keep all follow-up visits as told by your health care provider. This is important. This information is not intended to replace advice given to you by your health care provider. Make sure you discuss any questions you have with your health care provider. Document Revised: 07/13/2018 Document Reviewed: 07/13/2018 Elsevier Patient Education  2020 [employer].

## 2019-09-28 ENCOUNTER — Other Ambulatory Visit: Payer: Self-pay

## 2019-09-28 ENCOUNTER — Other Ambulatory Visit: Payer: Self-pay | Admitting: Internal Medicine

## 2019-09-28 DIAGNOSIS — E0500 Thyrotoxicosis with diffuse goiter without thyrotoxic crisis or storm: Secondary | ICD-10-CM

## 2019-10-01 ENCOUNTER — Ambulatory Visit (HOSPITAL_BASED_OUTPATIENT_CLINIC_OR_DEPARTMENT_OTHER)
Admission: RE | Admit: 2019-10-01 | Discharge: 2019-10-01 | Disposition: A | Discharge status: Home / Self Care | Payer: Managed Care, Other (non HMO) | Source: Ambulatory Visit | Attending: Medical | Admitting: Medical

## 2019-10-01 ENCOUNTER — Ambulatory Visit: Payer: Managed Care, Other (non HMO) | Admitting: Medical

## 2019-10-01 ENCOUNTER — Other Ambulatory Visit: Payer: Self-pay

## 2019-10-01 VITALS — BP 135/80 | HR 52 | Resp 18 | Ht 67.0 in | Wt 158.8 lb

## 2019-10-01 DIAGNOSIS — R252 Cramp and spasm: Secondary | ICD-10-CM | POA: Diagnosis not present

## 2019-10-01 DIAGNOSIS — M25511 Pain in right shoulder: Secondary | ICD-10-CM

## 2019-10-01 DIAGNOSIS — M5441 Lumbago with sciatica, right side: Secondary | ICD-10-CM | POA: Diagnosis present

## 2019-10-01 DIAGNOSIS — M79601 Pain in right arm: Secondary | ICD-10-CM | POA: Diagnosis present

## 2019-10-01 DIAGNOSIS — M542 Cervicalgia: Secondary | ICD-10-CM | POA: Diagnosis present

## 2019-10-01 DIAGNOSIS — I10 Essential (primary) hypertension: Secondary | ICD-10-CM

## 2019-10-01 DIAGNOSIS — S46811A Strain of other muscles, fascia and tendons at shoulder and upper arm level, right arm, initial encounter: Secondary | ICD-10-CM

## 2019-10-01 MED ORDER — HYDROXYZINE HCL 25 MG PO TABS: ORAL_TABLET | ORAL | 0 refills | Status: DC

## 2019-10-01 MED ORDER — PREDNISONE 10 MG PO TABS: ORAL_TABLET | ORAL | 0 refills | Status: DC

## 2019-10-01 NOTE — Progress Notes (Signed)
Subjective:    Patient ID: Carla West, female    DOB: 09-04-1960, 59 y.o.   MRN: [id number]  HPI  Pt in with some neck pain and arm pain for one week. Pt went to ED and then left while waiting.   Some rt side neck pain and it runs down to he rt hand. Fells like she has been working out. Lying down on her rt side hurts.   Pt states also som rt side lower back, hip and some pain that runs down her foot.   Pt has hx of migraine ha's. Headaches worse over past week. No light sensitivity. No sound sensitivity. In past I put in ct of head without contrast for dizziness and ha. Her insurance did not authorize that so she did not get it done. No present ha.   Pt does have high blood pressure and it does tend to run high. She is on amlodipine, losartan and metoprolol.   Pt has both covid vaccines.  Pt has some hx of insomonia. She tried trazadone and did not help also tried melatonin which did not help. Pt states ambien made her feel bad the next day.     Review of Systems See hpi.    Objective:   Physical Exam  General Mental Status- Alert. General Appearance- Not in acute distress.   Skin General: Color- Normal Color. Moisture- Normal Moisture.  Neck Carotid Arteries- Normal color. Moisture- Normal Moisture. No carotid bruits. No JVD.  Mild mid cervical spine tenderness and right paraspinal area tenderness.  Chest and Lung Exam Auscultation: Breath Sounds:-Normal.  Cardiovascular Auscultation:Rythm- Regular. Murmurs & Other Heart Sounds:Auscultation of the heart reveals- No Murmurs.  Abdomen Inspection:-Inspeection Normal. Palpation/Percussion:Note:No mass. Palpation and Percussion of the abdomen reveal- Non Tender, Non Distended + BS, no rebound or guarding.    Neurologic Cranial Nerve exam:- CN III-XII intact(No nystagmus), symmetric smile. Drift Test:- No drift. Romberg Exam:- Negative.  Heal to Toe Gait exam:-Normal. Finger to Nose:-  Normal/Intact Strength:- 5/5 equal and symmetric strength both upper and lower extremities. Upper extremities-has good grip strength bilaterally.  Normal coordination.  Sharp and dull discrimination intact bilaterally. Right lower extremity-she has decreased sharp and dull discrimination in the third fourth and fifth toe.  Normal motor function.  Rt shoulder-has good range of motion no crepitus.  Right anterior shoulder is mildly tender to palpation.  Pain over the biceps tendon region.  Lumbar-right SI tenderness to palpation.       Assessment & Plan:  Your recent neck pain with some radiating type features as well as trapezius strain injuries.  Recent right shoulder pain anterior aspect.  Could be biceps tendinitis or muscle pain.  Also recent low back pain with sciatica type features.  For the areas of pain today we will get cervical spine x-ray, lumbar spine x-ray and right shoulder x-ray.  Will provide 4-day tapered dose of prednisone and see if this helps with her pain.  Might consider getting gabapentin in the near future after x-ray review.  For hypertension, continue current medication regimen.  Will not check blood pressure twice today blood pressures were in very good range.  For insomnia, did prescribe hydroxyzine.  History of headaches but none today and normal neurologic exam.  His headaches are current please let us know.  For concern of right upper extremity arm pain medial aspect, did go ahead and order right upper extremity ultrasound.  Please have x-ray department do the study today or tomorrow at the latest.  For hand and feet cramping at times, did place order for metabolic panel and magnesium level.  Follow-up in 10 days or as needed.  Time spent with patient today was 40  minutes which consisted of chart review, discussing diagnosis, work u,p treatment and documentation.

## 2019-10-01 NOTE — Addendum Note (Signed)
Addended by: Gwenevere Abbot on: 10/01/2019 02:40 PM   Modules accepted: Orders

## 2019-10-01 NOTE — Patient Instructions (Signed)
Your recent neck pain with some radiating type features as well as trapezius strain injuries.  Recent right shoulder pain anterior aspect.  Could be biceps tendinitis or muscle pain.  Also recent low back pain with sciatica type features.  For the areas of pain today we will get cervical spine x-ray, lumbar spine x-ray and right shoulder x-ray.  Will provide 4-day tapered dose of prednisone and see if this helps with her pain.  Might consider getting gabapentin in the near future after x-ray review.  For hypertension, continue current medication regimen.  Will not check blood pressure twice today blood pressures were in very good range.  For insomnia, did prescribe hydroxyzine.  History of headaches but none today and normal neurologic exam.  His headaches are current please let us know.  For concern of right upper extremity arm pain medial aspect, did go ahead and order right upper extremity ultrasound.  Please have x-ray department do the study today or tomorrow at the latest.  For hand and feet cramping at times, did place order for metabolic panel and magnesium level.  Follow-up in 10 days or as needed.

## 2019-10-02 ENCOUNTER — Telehealth: Payer: Self-pay | Admitting: Medical

## 2019-10-02 DIAGNOSIS — R748 Abnormal levels of other serum enzymes: Secondary | ICD-10-CM

## 2019-10-02 LAB — COMPREHENSIVE METABOLIC PANEL
ALT: 13 U/L (ref 0–35)
AST: 16 U/L (ref 0–37)
Albumin: 4.3 g/dL (ref 3.5–5.2)
Alkaline Phosphatase: 250 U/L — ABNORMAL HIGH (ref 39–117)
BUN: 16 mg/dL (ref 6–23)
CO2: 27 mEq/L (ref 19–32)
Calcium: 8.7 mg/dL (ref 8.4–10.5)
Chloride: 105 mEq/L (ref 96–112)
Creatinine, Ser: 0.84 mg/dL (ref 0.40–1.20)
GFR: 84.65 mL/min (ref >60.00)
Glucose, Bld: 83 mg/dL (ref 70–99)
Potassium: 3.7 mEq/L (ref 3.5–5.1)
Sodium: 140 mEq/L (ref 135–145)
Total Bilirubin: 0.4 mg/dL (ref 0.2–1.2)
Total Protein: 6.8 g/dL (ref 6.0–8.3)

## 2019-10-02 LAB — MAGNESIUM: Magnesium: 2.2 mg/dL (ref 1.5–2.5)

## 2019-10-02 NOTE — Telephone Encounter (Signed)
Future alk iso enzymes placed.

## 2019-10-03 ENCOUNTER — Other Ambulatory Visit: Payer: Managed Care, Other (non HMO)

## 2019-10-03 DIAGNOSIS — R748 Abnormal levels of other serum enzymes: Secondary | ICD-10-CM

## 2019-10-03 NOTE — Addendum Note (Signed)
Addended by: Harley Alto on: 10/03/2019 07:10 AM   Modules accepted: Orders

## 2019-10-07 LAB — ALKALINE PHOSPHATASE, ISOENZYMES
Alkaline Phosphatase: 293 IU/L — ABNORMAL HIGH (ref 39–117)
BONE FRACTION: 85 % — ABNORMAL HIGH (ref 14–68)
INTESTINAL FRAC.: 0 % (ref 0–18)
LIVER FRACTION: 15 % — ABNORMAL LOW (ref 18–85)

## 2019-10-08 ENCOUNTER — Encounter: Payer: Self-pay | Admitting: Medical

## 2019-10-09 ENCOUNTER — Encounter: Payer: Self-pay | Admitting: Medical

## 2019-10-09 ENCOUNTER — Encounter: Payer: Self-pay | Admitting: Internal Medicine

## 2019-10-10 NOTE — Telephone Encounter (Signed)
Spoke with patient.  Per PCP, scheduled patient in office visit for Friday, 10/12/2019.

## 2019-10-12 ENCOUNTER — Ambulatory Visit: Payer: Managed Care, Other (non HMO) | Admitting: Medical

## 2019-10-12 ENCOUNTER — Other Ambulatory Visit: Payer: Self-pay

## 2019-10-12 VITALS — BP 138/93 | HR 66 | Temp 98.1°F | Resp 18 | Ht 67.0 in | Wt 157.0 lb

## 2019-10-12 DIAGNOSIS — M79601 Pain in right arm: Secondary | ICD-10-CM

## 2019-10-12 DIAGNOSIS — E0590 Thyrotoxicosis, unspecified without thyrotoxic crisis or storm: Secondary | ICD-10-CM | POA: Diagnosis not present

## 2019-10-12 DIAGNOSIS — R748 Abnormal levels of other serum enzymes: Secondary | ICD-10-CM

## 2019-10-12 DIAGNOSIS — I10 Essential (primary) hypertension: Secondary | ICD-10-CM | POA: Diagnosis not present

## 2019-10-12 DIAGNOSIS — Z1211 Encounter for screening for malignant neoplasm of colon: Secondary | ICD-10-CM

## 2019-10-12 NOTE — Patient Instructions (Signed)
For your recent alkaline phosphatase elevation with bone isoenzyme increase, will repeat CMP and isoenzyme levels next week early morning when you are fasting.  Discussed this with Dr. Abner Greenspan.  Will hold off presently but she might need bone scan study in the future.  For right upper extremity soreness/pain on occasion, will get x-ray of right humerus today.  For history of osteopenia we will repeat DEXA scan this coming November.  For history of hypothyroidism, continue with current medications prescribed by endocrinologist.  Also would follow recommendations of cardiologist and endocrinologist for thyroidectomy.  For colon cancer screening please pick up stool cards for blood today.  I think you will have to bring those back directly due to mailing issues.  Apologize for those issues in the past.  For hypertension, continue current medications.  Would recommend that you get smart watch to see if your pulse readings are abnormally high as they appear to be occasionally on the Zio patch study.  If your pulse is well controlled with rare tachycardia then might consider decreasing your metoprolol to 50 mg and increasing losartan to 50 mg.  We need to get input from cardiologist if they agree to that in the future.  Follow-up date to be determined after lab review.

## 2019-10-12 NOTE — Progress Notes (Signed)
Subjective:    Patient ID: Carla West, female    DOB: [birth year], 59 y.o.   MRN: [id number]  HPI  Pt in for follow up alk phosphatase isoenzymes.She has had various xrays in past but no noted paget type finding.    Pt has no irregular xays, no fractures and new pain except rt humerus area which has hurt in past with rt shoulder area pain.  She does have osteopenia but no osteoporosis. Last bone density was in November. 2019.    Pt has fatigue and is on metoprolol. Hx of hyperthyroid and started on metoprolol. Tsh and T4 normal range recently.    Hx of htn and on norvasc, losartan and toprol xl. Pt when she checks at home 135-140/90's.    On review/discussion pt does have occasional abdomen pain after eating. Pt not sure is she is taking bentyl. Md had written this for her.        Review of Systems  Constitutional: Positive for fatigue. Negative for chills and fever.       Hx of since covid infection months ago.   Respiratory: Negative for cough, chest tightness, shortness of breath and wheezing.   Cardiovascular: Negative for chest pain and palpitations.  Gastrointestinal: Positive for abdominal pain. Negative for abdominal distention, constipation, diarrhea, nausea and vomiting.       See hpi.   Musculoskeletal: Negative for back pain.  Skin: Negative for rash.  Neurological: Negative for dizziness, syncope, weakness and headaches.  Hematological: Negative for adenopathy. Does not bruise/bleed easily.  Psychiatric/Behavioral: Negative for behavioral problems and decreased concentration.    Past Medical History:  Diagnosis Date  . Abnormal Pap smear of cervix 08/03/2015  . Benign essential HTN 08/03/2015  . Graves disease   . History of chicken pox   . Migraine headache 08/03/2015     Social History   Socioeconomic History  . Marital status: Divorced    Spouse name: Not on file  . Number of children: Not on file  . Years of education: Not on file  .  Highest education level: Not on file  Occupational History  . Not on file  Tobacco Use  . Smoking status: Never Smoker  . Smokeless tobacco: Never Used  Substance and Sexual Activity  . Alcohol use: No  . Drug use: No  . 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 Not on file  Social History Narrative  . Not on file   Social Determinants of Health   Financial Resource Strain:   . Difficulty of Paying Living Expenses:   Food Insecurity:   . Worried About Charity fundraiser in the Last Year:   . Arboriculturist in the Last Year:   Transportation Needs:   . Film/video editor (Medical):   Marland Kitchen Lack of Transportation (Non-Medical):   Physical Activity:   . Days of Exercise per Week:   . Minutes of Exercise per Session:   Stress:   . Feeling of Stress :   Social Connections:   . Frequency of Communication with Friends and Family:   . Frequency of Social Gatherings with Friends and Family:   . Attends Religious Services:   . Active Member of Clubs or Organizations:   . Attends Archivist Meetings:   Marland Kitchen Marital Status:   Intimate Partner Violence:   . Fear of Current or Ex-Partner:   .  Emotionally Abused:   Marland Kitchen Physically Abused:   . Sexually Abused:     Past Surgical History:  Procedure Laterality Date  . CORONARY STENT INTERVENTION N/A 02/27/2019   Procedure: CORONARY STENT INTERVENTION;  Surgeon: Burnell Blanks, MD;  Location: [facility];  Service: Cardiovascular;  Laterality: N/A;  . LEFT HEART CATH AND CORONARY ANGIOGRAPHY N/A 02/27/2019   Procedure: LEFT HEART CATH AND CORONARY ANGIOGRAPHY;  Surgeon: Burnell Blanks, MD;  Location: [facility];  Service: Cardiovascular;  Laterality: N/A;  . TUBAL LIGATION      Family History  Problem Relation Age of Onset  . Hypertension Mother   . Heart disease Mother   . Hyperlipidemia Father    . Breast cancer Maternal Grandmother   . Hyperlipidemia Sister   . Other Brother        plan crash in [employer]  . Migraines Son   . Heart disease Brother        CHF, arrythmia  . Hypertension Brother   . Gout Brother   . Hypertension Brother     Allergies  Allergen Reactions  . Elavil [Amitriptyline Hcl] Other (See Comments)    Jittery..the patient states she has not had any side effects     Current Outpatient Medications on File Prior to Visit  Medication Sig Dispense Refill  . almotriptan (AXERT) 12.5 MG tablet Take 1 tablet by mouth twice daily as needed for migraine. May repeat in 2 hours if needed 20 tablet 0  . amitriptyline (ELAVIL) 10 MG tablet TAKE 1 TABLET(10 MG) BY MOUTH AT BEDTIME 30 tablet 0  . amLODipine (NORVASC) 10 MG tablet Take 1 tablet (10 mg total) by mouth daily. 30 tablet 5  . aspirin EC 81 MG tablet Take 1 tablet (81 mg total) by mouth daily. 90 tablet 3  . atorvastatin (LIPITOR) 80 MG tablet Take 1 tablet (80 mg total) by mouth daily at 6 PM. 90 tablet 3  . dicyclomine (BENTYL) 20 MG tablet Take 1 tablet (20 mg total) by mouth 4 (four) times daily -  before meals and at bedtime. (Patient not taking: Reported on 10/01/2019) 120 tablet 3  . hydrOXYzine (ATARAX/VISTARIL) 25 MG tablet 1 tab po q hs prn insomnia 30 tablet 0  . losartan (COZAAR) 25 MG tablet Take 1 tablet (25 mg total) by mouth daily. 30 tablet 3  . methimazole (TAPAZOLE) 10 MG tablet Take 10 mg in a.m. and 10 mg in the evening, with meals 90 tablet 2  . metoprolol succinate (TOPROL-XL) 100 MG 24 hr tablet Take 1 tablet (100 mg total) by mouth daily. Take with or immediately following a meal. 90 tablet 3  . predniSONE (DELTASONE) 10 MG tablet 4 tab po day 1, 3 tab po day 2, 2 tab po day 3, 1 tab po day 4 10 tablet 0  . promethazine (PHENERGAN) 25 MG tablet Take 1 tablet (25 mg total) by mouth every 6 (six) hours as needed for nausea or vomiting. 30 tablet 0  . sodium chloride (OCEAN) 0.65 % SOLN  nasal spray Place 1 spray into both nostrils 2 (two) times daily as needed for congestion. (Patient not taking: Reported on 10/01/2019) 30 mL 6  . ticagrelor (BRILINTA) 90 MG TABS tablet Take 1 tablet (90 mg total) by mouth 2 (two) times daily. 180 tablet 3   No current facility-administered medications on file prior to visit.    BP (!) 138/93 (BP Location: Left Arm, Patient Position: Sitting,  Cuff Size: Normal)   Pulse 66   Temp 98.1 F (36.7 C) (Temporal)   Resp 18   Ht '5\' 7"'  (1.702 m)   Wt 157 lb (71.2 kg)   SpO2 99%   BMI 24.59 kg/m       Objective:   Physical Exam  General- No acute distress. Pleasant patient. Neck- Full range of motion, no jvd Lungs- Clear, even and unlabored. Heart- regular rate and rhythm. Neurologic- CNII- XII grossly intact. Rt upper ext- arm not swollen. No redness. No pain on palpation.      Assessment & Plan:  For your recent alkaline phosphatase elevation with bone isoenzyme increase, will repeat CMP and isoenzyme levels next week early morning when you are fasting.  Discussed this with Dr. Charlett Blake.  Will hold off presently but she might need bone scan study in the future.  For right upper extremity soreness/pain on occasion, will get x-ray of right humerus today.  For history of osteopenia we will repeat DEXA scan this coming November.  For history of hypothyroidism, continue with current medications prescribed by endocrinologist.  Also would follow recommendations of cardiologist and endocrinologist for thyroidectomy.  For colon cancer screening please pick up stool cards for blood today.  I think you will have to bring those back directly due to mailing issues.  Apologize for those issues in the past.  For hypertension, continue current medications.  Would recommend that you get smart watch to see if your pulse readings are abnormally high as they appear to be occasionally on the Zio patch study.  If your pulse is well controlled with rare  tachycardia then might consider decreasing your metoprolol to 50 mg and increasing losartan to 50 mg.  We need to get input from cardiologist if they agree to that in the future.  Follow-up date to be determined after lab review.  Time spent with patient today was 35  minutes which consisted of chart review, discussing diagnoses, work up, treatment and documentation.

## 2019-10-14 ENCOUNTER — Other Ambulatory Visit: Payer: Self-pay | Admitting: Medical

## 2019-10-22 ENCOUNTER — Ambulatory Visit (HOSPITAL_COMMUNITY): Payer: Managed Care, Other (non HMO)

## 2019-10-22 ENCOUNTER — Encounter (HOSPITAL_COMMUNITY): Payer: Managed Care, Other (non HMO)

## 2019-10-23 ENCOUNTER — Encounter (HOSPITAL_COMMUNITY): Payer: Managed Care, Other (non HMO)

## 2019-11-10 NOTE — Progress Notes (Deleted)
Cardiology Office Note:   Date:  11/10/2019  NAME:  Carla West    MRN: [id number] DOB:  04-Jan-1961   PCP:  Mackie Pai, PA-C  Cardiologist:  Evalina Field, MD  Electrophysiologist:  None   Referring MD: Elise Benne   No chief complaint on file.  History of Present Illness:   Carla West is a 60 y.o. female with a hx of NSTEMI, HTN, HLD, hyperthyroidism who presents for follow-up.   Problem List: 1. NSTEMI 02/27/2019: PCI to [place] 2. HTN 3. Hyperthyroidism 4. HLD -T chol 154, HDL 83, TG 49, LDL 60  Past Medical History: Past Medical History:  Diagnosis Date  . Abnormal Pap smear of cervix 08/03/2015  . Benign essential HTN 08/03/2015  . Graves disease   . History of chicken pox   . Migraine headache 08/03/2015    Past Surgical History: Past Surgical History:  Procedure Laterality Date  . CORONARY STENT INTERVENTION N/A 02/27/2019   Procedure: CORONARY STENT INTERVENTION;  Surgeon: Burnell Blanks, MD;  Location: [facility];  Service: Cardiovascular;  Laterality: N/A;  . LEFT HEART CATH AND CORONARY ANGIOGRAPHY N/A 02/27/2019   Procedure: LEFT HEART CATH AND CORONARY ANGIOGRAPHY;  Surgeon: Burnell Blanks, MD;  Location: [facility];  Service: Cardiovascular;  Laterality: N/A;  . TUBAL LIGATION      Current Medications: No outpatient medications have been marked as taking for the 11/14/19 encounter (Appointment) with O'Neal, Cassie Freer, MD.     Allergies:    Elavil [amitriptyline hcl]   Social History: Social History   Socioeconomic History  . Marital status: Divorced    Spouse name: Not on file  . Number of children: Not on file  . Years of education: Not on file  . Highest education level: Not on file  Occupational History  . Not on file  Tobacco Use  . Smoking status: Never Smoker  . Smokeless tobacco: Never Used  Substance and Sexual Activity  . Alcohol use: No  . Drug use: No  . 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 Not on file  Social History Narrative  . Not on file   Social Determinants of Health   Financial Resource Strain:   . Difficulty of Paying Living Expenses:   Food Insecurity:   . Worried About Charity fundraiser in the Last Year:   . Arboriculturist in the Last Year:   Transportation Needs:   . Film/video editor (Medical):   Marland Kitchen Lack of Transportation (Non-Medical):   Physical Activity:   . Days of Exercise per Week:   . Minutes of Exercise per Session:   Stress:   . Feeling of Stress :   Social Connections:   . Frequency of Communication with Friends and Family:   . Frequency of Social Gatherings with Friends and Family:   . Attends Religious Services:   . Active Member of Clubs or Organizations:   . Attends Archivist Meetings:   Marland Kitchen Marital Status:      Family History: The patient's ***family history includes Breast cancer in her maternal grandmother; Gout in her brother; Heart disease in her brother and mother; Hyperlipidemia in her father and sister; Hypertension in her brother, brother, and mother; Migraines in her son; Other in her brother.  ROS:   All other ROS reviewed and negative. Pertinent positives noted  in the HPI.     EKGs/Labs/Other Studies Reviewed:   The following studies were personally reviewed by me today:  EKG:  EKG is *** ordered today.  The ekg ordered today demonstrates ***, and was personally reviewed by me.   Recent Labs: 01/23/2019: Pro B Natriuretic peptide (BNP) 152.0 02/27/2019: B Natriuretic Peptide 624.9 09/06/2019: Hemoglobin 12.7; Platelets 231 09/19/2019: TSH 0.28 10/01/2019: ALT 13; BUN 16; Creatinine, Ser 0.84; Magnesium 2.2; Potassium 3.7; Sodium 140   Recent Lipid Panel    Component Value Date/Time   CHOL 154 04/04/2019 1010   TRIG 49 04/04/2019 1010   HDL 83 04/04/2019 1010   CHOLHDL 1.9  04/04/2019 1010   CHOLHDL 2.4 02/27/2019 0500   VLDL 9 02/27/2019 0500   LDLCALC 60 04/04/2019 1010    Physical Exam:   VS:  There were no vitals taken for this visit.   Wt Readings from Last 3 Encounters:  10/12/19 157 lb (71.2 kg)  10/01/19 158 lb 12.8 oz (72 kg)  09/19/19 155 lb 9.6 oz (70.6 kg)    General: Well nourished, well developed, in no acute distress Heart: Atraumatic, normal size  Eyes: PEERLA, EOMI  Neck: Supple, no JVD Endocrine: No thryomegaly Cardiac: Normal S1, S2; RRR; no murmurs, rubs, or gallops Lungs: Clear to auscultation bilaterally, no wheezing, rhonchi or rales  Abd: Soft, nontender, no hepatomegaly  Ext: No edema, pulses 2+ Musculoskeletal: No deformities, BUE and BLE strength normal and equal Skin: Warm and dry, no rashes   Neuro: Alert and oriented to person, place, time, and situation, CNII-XII grossly intact, no focal deficits  Psych: Normal mood and affect   ASSESSMENT:   Carla West is a 59 y.o. female who presents for the following: No diagnosis found.  PLAN:   There are no diagnoses linked to this encounter.  Disposition: No follow-ups on file.  Medication Adjustments/Labs and Tests Ordered: Current medicines are reviewed at length with the patient today.  Concerns regarding medicines are outlined above.  No orders of the defined types were placed in this encounter.  No orders of the defined types were placed in this encounter.   There are no Patient Instructions on file for this visit.   Time Spent with Patient: I have spent a total of *** minutes with patient reviewing hospital notes, telemetry, EKGs, labs and examining the patient as well as establishing an assessment and plan that was discussed with the patient.  > 50% of time was spent in direct patient care.  Signed, Lenna Gilford. Flora Lipps, MD [facility]  [address] [place], [place] [address] [phone]  11/10/2019 1:30 PM

## 2019-11-14 ENCOUNTER — Ambulatory Visit: Payer: Managed Care, Other (non HMO) | Admitting: Cardiovascular Disease

## 2019-12-20 ENCOUNTER — Ambulatory Visit: Payer: Managed Care, Other (non HMO) | Admitting: Internal Medicine

## 2020-01-01 ENCOUNTER — Ambulatory Visit: Payer: Managed Care, Other (non HMO) | Admitting: Internal Medicine

## 2020-01-01 DIAGNOSIS — Z0289 Encounter for other administrative examinations: Secondary | ICD-10-CM

## 2020-01-13 ENCOUNTER — Other Ambulatory Visit: Payer: Self-pay | Admitting: Medical

## 2020-01-30 ENCOUNTER — Other Ambulatory Visit: Payer: Self-pay

## 2020-01-30 ENCOUNTER — Emergency Department (HOSPITAL_BASED_OUTPATIENT_CLINIC_OR_DEPARTMENT_OTHER)
Admission: EM | Admit: 2020-01-30 | Discharge: 2020-01-30 | Disposition: A | Discharge status: Home / Self Care | Payer: Managed Care, Other (non HMO) | Attending: Emergency Medicine | Admitting: Emergency Medicine

## 2020-01-30 ENCOUNTER — Encounter (HOSPITAL_BASED_OUTPATIENT_CLINIC_OR_DEPARTMENT_OTHER): Payer: Self-pay | Admitting: Emergency Medicine

## 2020-01-30 ENCOUNTER — Telehealth: Payer: Self-pay

## 2020-01-30 DIAGNOSIS — Z79899 Other long term (current) drug therapy: Secondary | ICD-10-CM | POA: Insufficient documentation

## 2020-01-30 DIAGNOSIS — R519 Headache, unspecified: Secondary | ICD-10-CM | POA: Diagnosis not present

## 2020-01-30 DIAGNOSIS — I10 Essential (primary) hypertension: Secondary | ICD-10-CM | POA: Insufficient documentation

## 2020-01-30 LAB — CBC WITH DIFFERENTIAL/PLATELET
Abs Immature Granulocytes: 0.01 10*3/uL (ref 0.00–0.07)
Basophils Absolute: 0 10*3/uL (ref 0.0–0.1)
Basophils Relative: 0 %
Eosinophils Absolute: 0 10*3/uL (ref 0.0–0.5)
Eosinophils Relative: 0 %
HCT: 42.1 % (ref 36.0–46.0)
Hemoglobin: 13.9 g/dL (ref 12.0–15.0)
Immature Granulocytes: 0 %
Lymphocytes Relative: 12 %
Lymphs Abs: 0.8 10*3/uL (ref 0.7–4.0)
MCH: 27.5 pg (ref 26.0–34.0)
MCHC: 33 g/dL (ref 30.0–36.0)
MCV: 83.4 fL (ref 80.0–100.0)
Monocytes Absolute: 0.4 10*3/uL (ref 0.1–1.0)
Monocytes Relative: 7 %
Neutro Abs: 5.4 10*3/uL (ref 1.7–7.7)
Neutrophils Relative %: 81 %
Platelets: 274 10*3/uL (ref 150–400)
RBC: 5.05 MIL/uL (ref 3.87–5.11)
RDW: 12.6 % (ref 11.5–15.5)
WBC: 6.6 10*3/uL (ref 4.0–10.5)
nRBC: 0 % (ref 0.0–0.2)

## 2020-01-30 LAB — BASIC METABOLIC PANEL
Anion gap: 14 (ref 5–15)
BUN: 29 mg/dL — ABNORMAL HIGH (ref 6–20)
CO2: 23 mmol/L (ref 22–32)
Calcium: 9.6 mg/dL (ref 8.9–10.3)
Chloride: 101 mmol/L (ref 98–111)
Creatinine, Ser: 0.94 mg/dL (ref 0.44–1.00)
GFR calc Af Amer: >60 mL/min (ref >60)
GFR calc non Af Amer: >60 mL/min (ref >60)
Glucose, Bld: 145 mg/dL — ABNORMAL HIGH (ref 70–99)
Potassium: 3.4 mmol/L — ABNORMAL LOW (ref 3.5–5.1)
Sodium: 138 mmol/L (ref 135–145)

## 2020-01-30 LAB — MAGNESIUM: Magnesium: 1.9 mg/dL (ref 1.7–2.4)

## 2020-01-30 MED ORDER — MAGNESIUM SULFATE 2 GM/50ML IV SOLN
2.0000 g | Freq: Once | INTRAVENOUS | Status: AC
  Administered 2020-01-30: 2 g via INTRAVENOUS
  Filled 2020-01-30: qty 50

## 2020-01-30 MED ORDER — DEXAMETHASONE SODIUM PHOSPHATE 10 MG/ML IJ SOLN
10.0000 mg | Freq: Once | INTRAMUSCULAR | Status: AC
  Administered 2020-01-30: 10 mg via INTRAVENOUS
  Filled 2020-01-30: qty 1

## 2020-01-30 MED ORDER — SODIUM CHLORIDE 0.9 % IV BOLUS
1000.0000 mL | Freq: Once | INTRAVENOUS | Status: AC
  Administered 2020-01-30: 1000 mL via INTRAVENOUS

## 2020-01-30 MED ORDER — MORPHINE SULFATE (PF) 4 MG/ML IV SOLN
4.0000 mg | Freq: Once | INTRAVENOUS | Status: AC
  Administered 2020-01-30: 4 mg via INTRAVENOUS
  Filled 2020-01-30: qty 1

## 2020-01-30 MED ORDER — METOCLOPRAMIDE HCL 5 MG/ML IJ SOLN
5.0000 mg | Freq: Once | INTRAMUSCULAR | Status: AC
  Administered 2020-01-30: 5 mg via INTRAVENOUS
  Filled 2020-01-30: qty 2

## 2020-01-30 MED ORDER — DIPHENHYDRAMINE HCL 50 MG/ML IJ SOLN
25.0000 mg | Freq: Once | INTRAMUSCULAR | Status: AC
  Administered 2020-01-30: 25 mg via INTRAVENOUS
  Filled 2020-01-30: qty 1

## 2020-01-30 MED ORDER — KETOROLAC TROMETHAMINE 15 MG/ML IJ SOLN
15.0000 mg | Freq: Once | INTRAMUSCULAR | Status: AC
  Administered 2020-01-30: 15 mg via INTRAVENOUS
  Filled 2020-01-30: qty 1

## 2020-01-30 MED ORDER — SODIUM CHLORIDE 0.9 % IV SOLN
INTRAVENOUS | Status: DC | PRN
  Administered 2020-01-30: 250 mL via INTRAVENOUS

## 2020-01-30 NOTE — ED Triage Notes (Addendum)
Migraine since yesterday.  Went to UC but they did not see her, stating that she needed to go to an ED instead.  Is having tingling.  Fingers cramping.  Momentary LOC in WR while checking in.  Did not fall. Vomiting and diarrhea since this morning.

## 2020-01-30 NOTE — ED Notes (Signed)
Pt on monitor 

## 2020-01-30 NOTE — Telephone Encounter (Signed)
Nurse Assessment Nurse: Stefano Gaul, RN, Dwana Curd Date/Time [name] Time): 01/30/2020 10:22:04 AM Confirm and document reason for call. If symptomatic, describe symptoms. ---Caller states her blood pressure is elevated. has a migraine. She has diarrhea and vomited x 5. Migraine started this am. has a severe headache. son tested negative for COVID on Monday. BP 170/101. Has the patient had close contact with a person known or suspected to have the novel coronavirus illness OR traveled / lives in area with major community spread (including international travel) in the last 14 days from the onset of symptoms? * If Asymptomatic, screen for exposure and travel within the last 14 days. ---No Does the patient have any new or worsening symptoms? ---Yes Will a triage be completed? ---Yes Related visit to physician within the last 2 weeks? ---No Does the PT have any chronic conditions? (i.e. diabetes, asthma, this includes High risk factors for pregnancy, etc.) ---Yes List chronic conditions. ---migraines; HTN; MI Is this a behavioral health or substance abuse call? ---No Guidelines Guideline Title Affirmed Question Affirmed Notes Nurse Date/Time (Eastern Time) Headache [1] SEVERE headache (e.g., excruciating) AND [2] "worst headache" of life Stefano Gaul, RN, Dwana Curd 01/30/2020 10:24:50 AM PLEASE NOTE: All timestamps contained within this report are represented as [place] Standard Time. CONFIDENTIALTY NOTICE: This fax transmission is intended only for the addressee. It contains information that is legally privileged, confidential or otherwise protected from use or disclosure. If you are not the intended recipient, you are strictly prohibited from reviewing, disclosing, copying using or disseminating any of this information or taking any action in reliance on or regarding this information. If you have received this fax in error, please notify us immediately by telephone so that we can arrange for its return  to [place]. Phone: [phone], Toll-Free: [phone], Fax: [phone] Page: 2 of 2 Call Id: [id number] Disp. Time [name] Time) Disposition Final User 01/30/2020 10:27:04 AM Go to ED Now (or PCP triage) Yes Stefano Gaul, RN, Clerance Lav Disagree/Comply Comply Caller Understands Yes PreDisposition Call Doctor Care Advice Given Per Guideline GO TO ED NOW (OR PCP TRIAGE): * IF NO PCP (PRIMARY CARE PROVIDER) SECOND-LEVEL TRIAGE: You need to be seen within the next hour. Go to the ED/UCC at _____________ Hospital. Leave as soon as you can. Referrals GO TO FACILITY OTHER - SPECIFY   Pt in ED.

## 2020-01-30 NOTE — ED Notes (Signed)
Ice chips provided per pt request

## 2020-01-30 NOTE — ED Notes (Signed)
ED Provider at bedside. 

## 2020-02-05 NOTE — ED Provider Notes (Signed)
[facility] EMERGENCY DEPARTMENT Provider Note   CSN: [id number] Arrival date & time: 01/30/20  1028     History Chief Complaint  Patient presents with  . Migraine - LOC in WR    Carla West is a 59 y.o. female.  HPI   59 year old female with headache. History of migraines. She attributes current symptoms to this. Began yesterday. Headache is diffuse. Not improved by typical medications. No fevers. No neck pain or stiffness. She is additionally having vomiting and diarrhea since this morning.  Past Medical History:  Diagnosis Date  . Abnormal Pap smear of cervix 08/03/2015  . Benign essential HTN 08/03/2015  . Graves disease   . History of chicken pox   . Migraine headache 08/03/2015    Patient Active Problem List   Diagnosis Date Noted  . NSTEMI (non-ST elevated myocardial infarction) (HCC) 02/27/2019  . Hypokalemia 02/27/2019  . 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 Atypical chest pain 01/29/2019  . Palpitations 04/11/2018  . Pain in right toe(s) 04/11/2018  . Abnormal TSH 12/28/2017  . Hyperlipidemia 12/28/2017  . Preventative health care 12/07/2017  . Essential hypertension 08/03/2015  . Migraine headache 08/03/2015  . Abnormal Pap smear of cervix 08/03/2015  . History of chicken pox     Past Surgical History:  Procedure Laterality Date  . CORONARY STENT INTERVENTION N/A 02/27/2019   Procedure: CORONARY STENT INTERVENTION;  Surgeon: Kathleene Hazel, MD;  Location: [facility] INVASIVE CV LAB;  Service: Cardiovascular;  Laterality: N/A;  . LEFT HEART CATH AND CORONARY ANGIOGRAPHY N/A 02/27/2019   Procedure: LEFT HEART CATH AND CORONARY ANGIOGRAPHY;  Surgeon: Kathleene Hazel, MD;  Location: [facility] INVASIVE CV LAB;  Service: Cardiovascular;  Laterality: N/A;  . TUBAL LIGATION       OB History   No obstetric history on file.     Family History  Problem Relation Age of Onset  . Hypertension Mother   . Heart disease Mother   .  Hyperlipidemia Father   . Breast cancer Maternal Grandmother   . Hyperlipidemia Sister   . Other Brother        plan crash in [employer]  . Migraines Son   . Heart disease Brother        CHF, arrythmia  . Hypertension Brother   . Gout Brother   . Hypertension Brother     Social History   Tobacco Use  . Smoking status: Never Smoker  . Smokeless tobacco: Never Used  Vaping Use  . Vaping Use: Never used  Substance Use Topics  . Alcohol use: No  . Drug use: No    Home Medications Prior to Admission medications   Medication Sig Start Date End Date Taking? Authorizing Provider  almotriptan (AXERT) 12.5 MG tablet Take 1 tablet by mouth twice daily as needed for migraine. May repeat in 2 hours if needed 06/26/19   Diloreto, [place] N, PA-C  amitriptyline (ELAVIL) 10 MG tablet TAKE 1 TABLET(10 MG) BY MOUTH AT BEDTIME. 01/14/20   Saguier, Ramon Dredge, PA-C  amLODipine (NORVASC) 10 MG tablet Take 1 tablet (10 mg total) by mouth daily. 06/14/19   Sande Rives, MD  aspirin EC 81 MG tablet Take 1 tablet (81 mg total) by mouth daily. 01/29/19   Georgeanna Lea, MD  atorvastatin (LIPITOR) 80 MG tablet Take 1 tablet (80 mg total) by mouth daily at 6 PM. 06/14/19   O'Neal, Ronnald Ramp, MD  dicyclomine (BENTYL) 20 MG tablet Take 1 tablet (20 mg  total) by mouth 4 (four) times daily -  before meals and at bedtime. Patient not taking: Reported on 10/01/2019 04/30/19   Tressia Danas, MD  hydrOXYzine (ATARAX/VISTARIL) 25 MG tablet 1 tab po q hs prn insomnia 10/01/19   Saguier, Ramon Dredge, PA-C  losartan (COZAAR) 25 MG tablet Take 1 tablet (25 mg total) by mouth daily. 06/27/19   Saguier, Ramon Dredge, PA-C  methimazole (TAPAZOLE) 10 MG tablet Take 10 mg in a.m. and 10 mg in the evening, with meals 06/01/19   [name], MD  metoprolol succinate (TOPROL-XL) 100 MG 24 hr tablet Take 1 tablet (100 mg total) by mouth daily. Take with or immediately following a meal. 06/14/19 09/12/19  O'Neal, Ronnald Ramp, MD  predniSONE (DELTASONE) 10 MG tablet 4 tab po day 1, 3 tab po day 2, 2 tab po day 3, 1 tab po day 4 10/01/19   Saguier, Ramon Dredge, PA-C  promethazine (PHENERGAN) 25 MG tablet Take 1 tablet (25 mg total) by mouth every 6 (six) hours as needed for nausea or vomiting. 06/26/19   Hohler, [place] N, PA-C  sodium chloride (OCEAN) 0.65 % SOLN nasal spray Place 1 spray into both nostrils 2 (two) times daily as needed for congestion. Patient not taking: Reported on 10/01/2019 04/02/19   Sande Rives, MD  ticagrelor (BRILINTA) 90 MG TABS tablet Take 1 tablet (90 mg total) by mouth 2 (two) times daily. 06/14/19   O'NealRonnald Ramp, MD    Allergies    Elavil [amitriptyline hcl]  Review of Systems   Review of Systems All systems reviewed and negative, other than as noted in HPI.  Physical Exam Updated Vital Signs BP (!) 158/99   Pulse 86   Temp 98.8 F (37.1 C) (Oral)   Resp 17   Ht 5\' 7"  (1.702 m)   Wt 72.6 kg   SpO2 94%   BMI 25.06 kg/m   Physical Exam Vitals and nursing note reviewed.  Constitutional:      General: She is not in acute distress.    Appearance: She is well-developed.  HENT:     Head: Normocephalic and atraumatic.  Eyes:     General:        Right eye: No discharge.        Left eye: No discharge.     Conjunctiva/sclera: Conjunctivae normal.  Neck:     Comments: No nuchal rigidity. Cardiovascular:     Rate and Rhythm: Normal rate and regular rhythm.     Heart sounds: Normal heart sounds. No murmur heard.  No friction rub. No gallop.   Pulmonary:     Effort: Pulmonary effort is normal. No respiratory distress.     Breath sounds: Normal breath sounds.  Abdominal:     General: There is no distension.     Palpations: Abdomen is soft.     Tenderness: There is no abdominal tenderness.  Musculoskeletal:        General: No tenderness.     Cervical back: Neck supple. No rigidity.  Skin:    General: Skin is warm and dry.  Neurological:      General: No focal deficit present.     Mental Status: She is alert and oriented to person, place, and time.     Cranial Nerves: No cranial nerve deficit.     Sensory: No sensory deficit.     Motor: No weakness.     Coordination: Coordination normal.  Psychiatric:        Behavior: Behavior normal.  Thought Content: Thought content normal.     ED Results / Procedures / Treatments   Labs (all labs ordered are listed, but only abnormal results are displayed) Labs Reviewed  BASIC METABOLIC PANEL - Abnormal; Notable for the following components:      Result Value   Potassium 3.4 (*)    Glucose, Bld 145 (*)    BUN 29 (*)    All other components within normal limits  CBC WITH DIFFERENTIAL/PLATELET  MAGNESIUM    EKG EKG Interpretation  Date/Time:  Wednesday January 30 2020 10:41:32 EDT Ventricular Rate:  77 PR Interval:    QRS Duration: 86 QT Interval:  372 QTC Calculation: 421 R Axis:   72 Text Interpretation: Sinus rhythm Consider left atrial enlargement Probable LVH with secondary repol abnrm Confirmed by Raeford Razor [phone]) on 01/30/2020 11:09:46 AM   Radiology No results found.  Procedures Procedures (including critical care time)  Medications Ordered in ED Medications  sodium chloride 0.9 % bolus 1,000 mL ( Intravenous Stopped 01/30/20 1256)  ketorolac (TORADOL) 15 MG/ML injection 15 mg (15 mg Intravenous Given 01/30/20 1135)  metoCLOPramide (REGLAN) injection 5 mg (5 mg Intravenous Given 01/30/20 1137)  diphenhydrAMINE (BENADRYL) injection 25 mg (25 mg Intravenous Given 01/30/20 1136)  dexamethasone (DECADRON) injection 10 mg (10 mg Intravenous Given 01/30/20 1326)  morphine 4 MG/ML injection 4 mg (4 mg Intravenous Given 01/30/20 1323)  magnesium sulfate IVPB 2 g 50 mL ( Intravenous Stopped 01/30/20 1348)    ED Course  I have reviewed the triage vital signs and the nursing notes.  Pertinent labs & imaging results that were available during my care of the  patient were reviewed by me and considered in my medical decision making (see chart for details).    MDM Rules/Calculators/A&P                          59 year old female with headache. Afebrile. Nonfocal neuro exam. Not anticoagulated. No trauma. Hypertension noted but I do not feel that this is a hypertensive emergency. She does symptomatically improvement. Return precautions discussed. Outpatient follow-up otherwise.   Final Clinical Impression(s) / ED Diagnoses Final diagnoses:  Nonintractable headache, unspecified chronicity pattern, unspecified headache type    Rx / DC Orders ED Discharge Orders    None       Raeford Razor, MD 02/05/20 1051

## 2020-02-13 ENCOUNTER — Other Ambulatory Visit: Payer: Self-pay

## 2020-02-13 ENCOUNTER — Ambulatory Visit (INDEPENDENT_AMBULATORY_CARE_PROVIDER_SITE_OTHER): Payer: Managed Care, Other (non HMO) | Admitting: Medical

## 2020-02-13 VITALS — BP 164/113 | HR 65 | Resp 18 | Ht 67.0 in | Wt 156.0 lb

## 2020-02-13 DIAGNOSIS — I10 Essential (primary) hypertension: Secondary | ICD-10-CM

## 2020-02-13 DIAGNOSIS — R739 Hyperglycemia, unspecified: Secondary | ICD-10-CM

## 2020-02-13 DIAGNOSIS — Z0000 Encounter for general adult medical examination without abnormal findings: Secondary | ICD-10-CM

## 2020-02-13 DIAGNOSIS — Z1231 Encounter for screening mammogram for malignant neoplasm of breast: Secondary | ICD-10-CM

## 2020-02-13 DIAGNOSIS — E0590 Thyrotoxicosis, unspecified without thyrotoxic crisis or storm: Secondary | ICD-10-CM

## 2020-02-13 MED ORDER — HYDROXYZINE HCL 25 MG PO TABS: ORAL_TABLET | ORAL | 1 refills | Status: DC

## 2020-02-13 MED ORDER — AMLODIPINE BESYLATE 10 MG PO TABS: 10.0000 mg | ORAL_TABLET | Freq: Every day | ORAL | 1 refills | Status: DC

## 2020-02-13 MED ORDER — DICYCLOMINE HCL 20 MG PO TABS: 20.0000 mg | ORAL_TABLET | Freq: Three times a day (TID) | ORAL | 3 refills | Status: DC

## 2020-02-13 MED ORDER — METOPROLOL SUCCINATE ER 100 MG PO TB24: 100.0000 mg | ORAL_TABLET | Freq: Every day | ORAL | 3 refills | Status: DC

## 2020-02-13 MED ORDER — TICAGRELOR 90 MG PO TABS: 90.0000 mg | ORAL_TABLET | Freq: Two times a day (BID) | ORAL | 3 refills | Status: DC

## 2020-02-13 MED ORDER — ALMOTRIPTAN MALATE 12.5 MG PO TABS: ORAL_TABLET | ORAL | 0 refills | Status: DC

## 2020-02-13 MED ORDER — LOSARTAN POTASSIUM 50 MG PO TABS: 50.0000 mg | ORAL_TABLET | Freq: Every day | ORAL | 0 refills | Status: DC

## 2020-02-13 MED ORDER — ATORVASTATIN CALCIUM 80 MG PO TABS: 80.0000 mg | ORAL_TABLET | Freq: Every day | ORAL | 3 refills | Status: DC

## 2020-02-13 MED ORDER — METHIMAZOLE 10 MG PO TABS: ORAL_TABLET | ORAL | 2 refills | Status: DC

## 2020-02-13 MED FILL — AMLODIPINE BESYLATE 10 MG T: 10 | 30 days supply | Qty: 30 | Fill #0

## 2020-02-13 MED FILL — ATORVASTATIN 80 MG TABLET: 80 | 30 days supply | Qty: 30 | Fill #0

## 2020-02-13 MED FILL — hydrOXYzine HCL 25 MG TABS: 25 | 30 days supply | Qty: 30 | Fill #0

## 2020-02-13 MED FILL — methIMAzole 10 MG TABS: 10 | 30 days supply | Qty: 60 | Fill #0

## 2020-02-13 MED FILL — BRILINTA 90 MG TABLET: 90 | 30 days supply | Qty: 60 | Fill #0

## 2020-02-13 MED FILL — DICYCLOMINE 20 MG TABLET: 20 | 30 days supply | Qty: 120 | Fill #0

## 2020-02-13 MED FILL — LOSARTAN POTASSIUM 50 MG TA: 50 | 30 days supply | Qty: 30 | Fill #0

## 2020-02-13 MED FILL — ALMOTRIPTAN MALATE 12.5 MG: 12.5 | 6 days supply | Qty: 12 | Fill #0

## 2020-02-13 NOTE — Patient Instructions (Addendum)
For you wellness exam today I have ordered cbc, cmp and lipid panel.  Your blood pressure is elevated today and will increase losartan to 50 mg daily.  For recent low potassium will recheck potassium level on metabolic panel.  Recent mild high sugars and will recheck your A1c.  Recommend low sugar diet.  For history of hypothyroidism repeating TSH and T4.  Put in referral to Dr. Wyvonnia Lora for endocrinologist as well.  I refilled all meds today.  Majority 90-day prescriptions.  Placed screening mammogram order.  Recommend exercise and healthy diet.  We will let you know lab results as they come in.  Follow up date appointment will be determined after lab review.    Preventive Care 54-44 Years Old, Female Preventive care refers to visits with your health care provider and lifestyle choices that can promote health and wellness. This includes:  A yearly physical exam. This may also be called an annual well check.  Regular dental visits and eye exams.  Immunizations.  Screening for certain conditions.  Healthy lifestyle choices, such as eating a healthy diet, getting regular exercise, not using drugs or products that contain nicotine and tobacco, and limiting alcohol use. What can I expect for my preventive care visit? Physical exam Your health care provider will check your:  Height and weight. This may be used to calculate body mass index (BMI), which tells if you are at a healthy weight.  Heart rate and blood pressure.  Skin for abnormal spots. Counseling Your health care provider may ask you questions about your:  Alcohol, tobacco, and drug use.  Emotional well-being.  Home and relationship well-being.  Sexual activity.  Eating habits.  Work and work Statistician.  Method of birth control.  Menstrual cycle.  Pregnancy history. What immunizations do I need?  Influenza (flu) vaccine  This is recommended every year. Tetanus, diphtheria, and pertussis (Tdap)  vaccine  You may need a Td booster every 10 years. Varicella (chickenpox) vaccine  You may need this if you have not been vaccinated. Zoster (shingles) vaccine  You may need this after age 26. Measles, mumps, and rubella (MMR) vaccine  You may need at least one dose of MMR if you were born in 1957 or later. You may also need a second dose. Pneumococcal conjugate (PCV13) vaccine  You may need this if you have certain conditions and were not previously vaccinated. Pneumococcal polysaccharide (PPSV23) vaccine  You may need one or two doses if you smoke cigarettes or if you have certain conditions. Meningococcal conjugate (MenACWY) vaccine  You may need this if you have certain conditions. Hepatitis A vaccine  You may need this if you have certain conditions or if you travel or work in places where you may be exposed to hepatitis A. Hepatitis B vaccine  You may need this if you have certain conditions or if you travel or work in places where you may be exposed to hepatitis B. Haemophilus influenzae type b (Hib) vaccine  You may need this if you have certain conditions. Human papillomavirus (HPV) vaccine  If recommended by your health care provider, you may need three doses over 6 months. You may receive vaccines as individual doses or as more than one vaccine together in one shot (combination vaccines). Talk with your health care provider about the risks and benefits of combination vaccines. What tests do I need? Blood tests  Lipid and cholesterol levels. These may be checked every 5 years, or more frequently if you are over 50  years old.  Hepatitis C test.  Hepatitis B test. Screening  Lung cancer screening. You may have this screening every year starting at age 52 if you have a 30-pack-year history of smoking and currently smoke or have quit within the past 15 years.  Colorectal cancer screening. All adults should have this screening starting at age 31 and continuing until  age 90. Your health care provider may recommend screening at age 79 if you are at increased risk. You will have tests every 1-10 years, depending on your results and the type of screening test.  Diabetes screening. This is done by checking your blood sugar (glucose) after you have not eaten for a while (fasting). You may have this done every 1-3 years.  Mammogram. This may be done every 1-2 years. Talk with your health care provider about when you should start having regular mammograms. This may depend on whether you have a family history of breast cancer.  BRCA-related cancer screening. This may be done if you have a family history of breast, ovarian, tubal, or peritoneal cancers.  Pelvic exam and Pap test. This may be done every 3 years starting at age 53. Starting at age 65, this may be done every 5 years if you have a Pap test in combination with an HPV test. Other tests  Sexually transmitted disease (STD) testing.  Bone density scan. This is done to screen for osteoporosis. You may have this scan if you are at high risk for osteoporosis. Follow these instructions at home: Eating and drinking  Eat a diet that includes fresh fruits and vegetables, whole grains, lean protein, and low-fat dairy.  Take vitamin and mineral supplements as recommended by your health care provider.  Do not drink alcohol if: ? Your health care provider tells you not to drink. ? You are pregnant, may be pregnant, or are planning to become pregnant.  If you drink alcohol: ? Limit how much you have to 0-1 drink a day. ? Be aware of how much alcohol is in your drink. In the [place] one drink equals one 12 oz bottle of beer (355 mL), one 5 oz glass of wine (148 mL), or one 1 oz glass of hard liquor (44 mL). Lifestyle  Take daily care of your teeth and gums.  Stay active. Exercise for at least 30 minutes on 5 or more days each week.  Do not use any products that contain nicotine or tobacco, such as cigarettes,  e-cigarettes, and chewing tobacco. If you need help quitting, ask your health care provider.  If you are sexually active, practice safe sex. Use a condom or other form of birth control (contraception) in order to prevent pregnancy and STIs (sexually transmitted infections).  If told by your health care provider, take low-dose aspirin daily starting at age 5. What's next?  Visit your health care provider once a year for a well check visit.  Ask your health care provider how often you should have your eyes and teeth checked.  Stay up to date on all vaccines. This information is not intended to replace advice given to you by your health care provider. Make sure you discuss any questions you have with your health care provider. Document Revised: 02/09/2018 Document Reviewed: 02/09/2018 Elsevier Patient Education  2020 [employer].

## 2020-02-13 NOTE — Progress Notes (Signed)
   Subjective:    Patient ID: Carla West, female    DOB: [birth year], 59 y.o.   MRN: [id number]  HPI  Pt in for cpe/wellness exam.  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 Pt did get vaccine after she got covid. She does plan to get booster.   Pt request refill of all her meds for 90 days.   Pt hyperthyroid hx. Pt has been on methimazole. She was advised to get uptake scan. Pt want to establish care with Dr. Lonzo Cloud. Pt never got uptake scan in past due to covid infection/long haul etc.  Also colonosocopy was not done since cardiologist wanted her to hold off during recovery.  Pt bp when she is checking is 160/90-102. Pt is on 3 medications for bp.  Recent low k and high sugar in ED  Review of Systems  Constitutional: Negative for chills, fatigue and fever.  Respiratory: Negative for cough, chest tightness, shortness of breath and wheezing.   Cardiovascular: Negative for chest pain and palpitations.  Gastrointestinal: Negative for abdominal pain.  Musculoskeletal: Negative for gait problem.  Neurological: Negative for dizziness, speech difficulty, weakness and light-headedness.  Hematological: Negative for adenopathy.  Psychiatric/Behavioral: Negative for behavioral problems.       Objective:   Physical Exam  General Mental Status- Alert. General Appearance- Not in acute distress.   Skin General: Color- Normal Color. Moisture- Normal Moisture.  Neck Range of motion.  No thyromegaly.  Chest and Lung Exam Auscultation: Breath Sounds:-Normal.  Cardiovascular Auscultation:Rythm- Regular. Murmurs & Other Heart Sounds:Auscultation of the heart reveals- No Murmurs.  Abdomen Inspection:-Inspeection Normal. Palpation/Percussion:Note:No mass. Palpation and Percussion of the abdomen reveal- Non Tender, Non Distended + BS, no rebound or guarding.    Neurologic Cranial Nerve exam:- CN III-XII intact(No nystagmus), symmetric  smile. Strength:- 5/5 equal and symmetric strength both upper and lower extremities.     Assessment & Plan:  For you wellness exam today I have ordered cbc, cmp and lipid panel.  Your blood pressure is elevated today and will increase losartan to 50 mg daily.  For recent low potassium will recheck potassium level on metabolic panel.  Recent mild high sugars and will recheck your A1c.  Recommend low sugar diet.  For history of hypothyroidism repeating TSH and T4.  Put in referral to Dr. Wonda Olds for endocrinologist as well.  I refilled all meds today.  Majority 90-day prescriptions.  Placed screening mammogram order.  Recommend exercise and healthy diet.  We will let you know lab results as they come in.  Follow up date appointment will be determined after lab review.   [name], [place]   [phone] charge as did address blood pressure as well as place referral to endocrinologist.

## 2020-02-14 LAB — COMPREHENSIVE METABOLIC PANEL
AG Ratio: 1.7 (calc) (ref 1.0–2.5)
ALT: 17 U/L (ref 6–29)
AST: 15 U/L (ref 10–35)
Albumin: 4 g/dL (ref 3.6–5.1)
Alkaline phosphatase (APISO): 90 U/L (ref 37–153)
BUN: 18 mg/dL (ref 7–25)
CO2: 26 mmol/L (ref 20–32)
Calcium: 8.9 mg/dL (ref 8.6–10.4)
Chloride: 106 mmol/L (ref 98–110)
Creat: 0.77 mg/dL (ref 0.50–1.05)
Globulin: 2.4 g/dL (calc) (ref 1.9–3.7)
Glucose, Bld: 99 mg/dL (ref 65–99)
Potassium: 4 mmol/L (ref 3.5–5.3)
Sodium: 141 mmol/L (ref 135–146)
Total Bilirubin: 0.2 mg/dL (ref 0.2–1.2)
Total Protein: 6.4 g/dL (ref 6.1–8.1)

## 2020-02-14 LAB — LIPID PANEL
Cholesterol: 217 mg/dL — ABNORMAL HIGH (ref <200)
HDL: 78 mg/dL (ref >=50)
LDL Cholesterol (Calc): 124 mg/dL (calc) — ABNORMAL HIGH
Non-HDL Cholesterol (Calc): 139 mg/dL (calc) — ABNORMAL HIGH (ref <130)
Total CHOL/HDL Ratio: 2.8 (calc) (ref <5.0)
Triglycerides: 63 mg/dL (ref <150)

## 2020-02-14 LAB — CBC WITH DIFFERENTIAL/PLATELET
Absolute Monocytes: 396 cells/uL (ref 200–950)
Basophils Absolute: 9 cells/uL (ref 0–200)
Basophils Relative: 0.2 %
Eosinophils Absolute: 30 cells/uL (ref 15–500)
Eosinophils Relative: 0.7 %
HCT: 36.9 % (ref 35.0–45.0)
Hemoglobin: 12.1 g/dL (ref 11.7–15.5)
Lymphs Abs: 1337 cells/uL (ref 850–3900)
MCH: 27.6 pg (ref 27.0–33.0)
MCHC: 32.8 g/dL (ref 32.0–36.0)
MCV: 84.1 fL (ref 80.0–100.0)
MPV: 9.3 fL (ref 7.5–12.5)
Monocytes Relative: 9.2 %
Neutro Abs: 2528 cells/uL (ref 1500–7800)
Neutrophils Relative %: 58.8 %
Platelets: 246 10*3/uL (ref 140–400)
RBC: 4.39 10*6/uL (ref 3.80–5.10)
RDW: 12 % (ref 11.0–15.0)
Total Lymphocyte: 31.1 %
WBC: 4.3 10*3/uL (ref 3.8–10.8)

## 2020-02-14 LAB — HEMOGLOBIN A1C
Hgb A1c MFr Bld: 5.9 % of total Hgb — ABNORMAL HIGH (ref <5.7)
Mean Plasma Glucose: 123 (calc)
eAG (mmol/L): 6.8 (calc)

## 2020-02-14 LAB — TSH: TSH: <0.01 mIU/L — ABNORMAL LOW (ref 0.40–4.50)

## 2020-02-14 LAB — T4, FREE: Free T4: 2.4 ng/dL — ABNORMAL HIGH (ref 0.8–1.8)

## 2020-02-15 ENCOUNTER — Encounter: Payer: Self-pay | Admitting: Medical

## 2020-02-15 ENCOUNTER — Telehealth: Payer: Self-pay | Admitting: Medical

## 2020-02-15 NOTE — Telephone Encounter (Signed)
Patient states migraine is making her sick, patient is unable to keep anything down. Patient has diarrhea.    almotriptan (AXERT) 12.5 MG tablet [[id number]]

## 2020-02-15 NOTE — Telephone Encounter (Signed)
I sent pt a my chart message on Friday night. Please see that message and try to call her on Tuesday morning.

## 2020-02-15 NOTE — Telephone Encounter (Signed)
CallerSilvio Pate  Call Back # [phone]  Patient states she has a headache, vomiting, diarrhea. Patient would like [name] to send a medication into Pharmacy for her headache.

## 2020-02-19 ENCOUNTER — Telehealth: Payer: Self-pay

## 2020-02-19 ENCOUNTER — Telehealth: Payer: Self-pay | Admitting: Medical

## 2020-02-19 MED ORDER — ONDANSETRON 8 MG PO TBDP
8.0000 mg | ORAL_TABLET | Freq: Three times a day (TID) | ORAL | 0 refills | Status: DC | PRN
Start: 2020-02-19 — End: 2020-03-20

## 2020-02-19 NOTE — Telephone Encounter (Signed)
Nurse Assessment Nurse: Darcus Austin, RN, Sarah Date/Time Carla West Time): 02/15/2020 6:40:52 PM Confirm and document reason for call. If symptomatic, describe symptoms. ---Caller states she woke up this morning with a HA. States she then started having vomiting and diarrhea. Vomited and diarrhea more than 8 times. States she hasn't been able to keep anything down all day. No fever. Has the patient had close contact with a person known or suspected to have the novel coronavirus illness OR traveled / lives in area with major community spread (including international travel) in the last 14 days from the onset of symptoms? * If Asymptomatic, screen for exposure and travel within the last 14 days. ---No Does the patient have any new or worsening symptoms? ---Yes Will a triage be completed? ---Yes Related visit to physician within the last 2 weeks? ---Yes Does the PT have any chronic conditions? (i.e. diabetes, asthma, this includes High risk factors for pregnancy, etc.) ---Yes List chronic conditions. ---HTN, MI last year Is this a behavioral health or substance abuse call? ---No Nurse: Darcus Austin, RN, Sarah Date/Time (Eastern Time): 02/15/2020 6:46:01 PM Please select the assessment type ---Standing order Additional Documentation ---SMO Phenergan suppository Other current medications? ---Yes Medication allergies? ---No PLEASE NOTE: All timestamps contained within this report are represented as [place] Standard Time. CONFIDENTIALTY NOTICE: This fax transmission is intended only for the addressee. It contains information that is legally privileged, confidential or otherwise protected from use or disclosure. If you are not the intended recipient, you are strictly prohibited from reviewing, disclosing, copying using or disseminating any of this information or taking any action in reliance on or regarding this information. If you have received this fax in error, please notify us immediately by telephone so that  we can arrange for its return to [place]. Phone: [phone], Toll-Free: [phone], Fax: [phone] Page: 2 of 3 Call Id: [id number] Nurse Assessment Pharmacy name and phone number. ---[facility] [phone] Guidelines Guideline Title Affirmed Question Affirmed Notes Nurse Date/Time Carla West Time) Vomiting [1] SEVERE vomiting (e.g., 6 or more times/ day) AND [2] present > 8 hours (Exception: patient sounds well, is drinking liquids, does not sound dehydrated, and vomiting has lasted less than 24 hours) Goins, RN, Maralyn Sago 02/15/2020 6:42:25 PM Disp. Time Carla West Time) Disposition Final User 02/15/2020 6:54:10 PM Pharmacy Call Goins, RN, Sarah Reason: Called and left voicemail at preferred pharmacy for [facility] phenergan 02/15/2020 6:45:42 PM Go to ED Now (or PCP triage) Yes Goins, RN, Karren Cobble Disagree/Comply Disagree Caller Understands Yes PreDisposition Call Doctor Care Advice Given Per Guideline GO TO ED NOW (OR PCP TRIAGE): * IF NO PCP (PRIMARY CARE PROVIDER) SECOND-LEVEL TRIAGE: You need to be seen within the next hour. Go to the ED/UCC at _____________ Hospital. Leave as soon as you can. CARE ADVICE per Vomiting (Adult) guideline. Standing Orders Preparation Additional Instructions Route Frequency Duration Nurse Comments User Name Phenergan 25 mg 1 Suppository # 6 Per Rectum Every 4 Hours Goins, RN, Sarah Referrals GO TO FACILITY REFUSED

## 2020-02-19 NOTE — Telephone Encounter (Signed)
Rx zofran sent to pt pharmacy. 

## 2020-02-19 NOTE — Telephone Encounter (Signed)
Called pt again and lvm to return call 

## 2020-02-19 NOTE — Telephone Encounter (Signed)
Pt sent me message on Friday and had asked for bp update. Recently increased her losartan so wanted idea on bp as wanted to make sure ha not associated with persisting bp elevation.   I have not seen response on that question.  Regarding loose stool,nauseau and vomiting would recommend office visit.   Will  send in zofran to pharmacy. For loose stools can use immodium. Bland diet. Temporary measures until she can come in.  If severe signs and symptoms despite above then uc or ED.  I am ok seeing her in office provided no cough, bodyaches, fevers, sob, wheezing or other covid type symptoms.  Pt has had covid last year and has been through 2 shot series.

## 2020-02-19 NOTE — Telephone Encounter (Signed)
Called patient and tried to read message to her and she said she would call me back and I just stated she can reply to the [place] message .

## 2020-02-22 ENCOUNTER — Emergency Department (HOSPITAL_COMMUNITY): Payer: Managed Care, Other (non HMO)

## 2020-02-22 ENCOUNTER — Other Ambulatory Visit: Payer: Self-pay

## 2020-02-22 ENCOUNTER — Encounter (HOSPITAL_COMMUNITY): Payer: Self-pay | Admitting: *Deleted

## 2020-02-22 ENCOUNTER — Ambulatory Visit (HOSPITAL_COMMUNITY)
Admission: EM | Admit: 2020-02-22 | Discharge: 2020-02-22 | Disposition: A | Discharge status: Home / Self Care | Payer: Managed Care, Other (non HMO) | Attending: Urgent Care | Admitting: Urgent Care

## 2020-02-22 ENCOUNTER — Encounter (HOSPITAL_COMMUNITY): Payer: Self-pay

## 2020-02-22 ENCOUNTER — Emergency Department (HOSPITAL_COMMUNITY)
Admission: EM | Admit: 2020-02-22 | Discharge: 2020-02-22 | Disposition: A | Discharge status: Home / Self Care | Payer: Managed Care, Other (non HMO) | Attending: Emergency Medicine | Admitting: Emergency Medicine

## 2020-02-22 DIAGNOSIS — R079 Chest pain, unspecified: Secondary | ICD-10-CM | POA: Diagnosis not present

## 2020-02-22 DIAGNOSIS — Z5321 Procedure and treatment not carried out due to patient leaving prior to being seen by health care provider: Secondary | ICD-10-CM | POA: Diagnosis not present

## 2020-02-22 DIAGNOSIS — G43909 Migraine, unspecified, not intractable, without status migrainosus: Secondary | ICD-10-CM | POA: Insufficient documentation

## 2020-02-22 LAB — CBC
HCT: 41.4 % (ref 36.0–46.0)
Hemoglobin: 13.6 g/dL (ref 12.0–15.0)
MCH: 27.6 pg (ref 26.0–34.0)
MCHC: 32.9 g/dL (ref 30.0–36.0)
MCV: 84 fL (ref 80.0–100.0)
Platelets: 320 10*3/uL (ref 150–400)
RBC: 4.93 MIL/uL (ref 3.87–5.11)
RDW: 12.6 % (ref 11.5–15.5)
WBC: 4.7 10*3/uL (ref 4.0–10.5)
nRBC: 0 % (ref 0.0–0.2)

## 2020-02-22 LAB — BASIC METABOLIC PANEL
Anion gap: 15 (ref 5–15)
BUN: 16 mg/dL (ref 6–20)
CO2: 19 mmol/L — ABNORMAL LOW (ref 22–32)
Calcium: 9.9 mg/dL (ref 8.9–10.3)
Chloride: 107 mmol/L (ref 98–111)
Creatinine, Ser: 0.79 mg/dL (ref 0.44–1.00)
GFR calc Af Amer: >60 mL/min (ref >60)
GFR calc non Af Amer: >60 mL/min (ref >60)
Glucose, Bld: 127 mg/dL — ABNORMAL HIGH (ref 70–99)
Potassium: 3.8 mmol/L (ref 3.5–5.1)
Sodium: 141 mmol/L (ref 135–145)

## 2020-02-22 LAB — TROPONIN I (HIGH SENSITIVITY): Troponin I (High Sensitivity): 7 ng/L (ref <18)

## 2020-02-22 MED ORDER — ONDANSETRON 4 MG PO TBDP
8.0000 mg | ORAL_TABLET | Freq: Once | ORAL | Status: AC
  Administered 2020-02-22: 8 mg via ORAL
  Filled 2020-02-22: qty 2

## 2020-02-22 NOTE — ED Notes (Signed)
Patient is being discharged from the Urgent Care and sent to the Emergency Department via POC driven by family . Per [place], [place], patient is in need of higher level of care due to chest pain, headache, abnormal EKG and cardiac history. Patient is aware and verbalizes understanding of plan of care.  Vitals:   02/22/20 1832  BP: (!) 167/99  Pulse: (!) 111  Resp: (!) 22  Temp: 98.6 F (37 C)  SpO2: 100%

## 2020-02-22 NOTE — ED Notes (Signed)
Pt and pts sister concerned about pt waiting in the waiting area, the wait time and process was explained to the pts sister. Pt then came up and stated "I want to know who is on staffing tonight, you know what I will just file a complaint it's okay this is terrible I am leaving". This NT advised to return if symptoms do begin to get worse. Pt exited ED with sister.

## 2020-02-22 NOTE — ED Triage Notes (Addendum)
Headache with nause vomiting since yesterday  She has taken her migraine med but it has not helped  She was sent here from ucc for treatment  lmp none  She reports that one side of her body feel cold and tingling respirations fast

## 2020-02-22 NOTE — ED Triage Notes (Signed)
Patient presents to Urgent Care with complaints of chest pain , headaches, and vomiting since earlier today. Patient reports she has a hx of htn and has a stent in place.  EKG obtained, sinus tach at 111.

## 2020-02-23 ENCOUNTER — Encounter: Payer: Self-pay | Admitting: Medical

## 2020-02-25 ENCOUNTER — Telehealth: Payer: Self-pay

## 2020-02-25 ENCOUNTER — Telehealth: Payer: Self-pay | Admitting: Medical

## 2020-02-25 NOTE — Telephone Encounter (Signed)
Looks like pt was seen in ED this weekend but left before work up was done. I don't see that she was seen yesterday.   Very high bp level with headache should not be virtual. Need to do neurologic exam and may need ct of head done.  She can be seen today at 1 pm in office. This is preferred. Next appointment 820 tomorrow. If tomorrow make 40 minute appointment slot.

## 2020-02-25 NOTE — Telephone Encounter (Signed)
Nurse Assessment Nurse: Hollice Espy, RN, Rachael Fee Date/Time [name] Time): 02/22/2020 8:14:30 PM Confirm and document reason for call. If symptomatic, describe symptoms. ---Caller states she is vomiting. Blood pressure 170/107. She just left the ER. Caller had some tingling on the right side but it has now went away. ER had an 8hr wait and caller left. Has the patient had close contact with a person known or suspected to have the novel coronavirus illness OR traveled / lives in area with major community spread (including international travel) in the last 14 days from the onset of symptoms? * If Asymptomatic, screen for exposure and travel within the last 14 days. ---No Does the patient have any new or worsening symptoms? ---Yes Will a triage be completed? ---Yes Related visit to physician within the last 2 weeks? ---No Does the PT have any chronic conditions? (i.e. diabetes, asthma, this includes High risk factors for pregnancy, etc.) ---Yes List chronic conditions. ---HTN Is this a behavioral health or substance abuse call? ---No Guidelines Guideline Title Affirmed Question Affirmed Notes Nurse Date/Time [name] Time) Neurologic Deficit Headache (and neurologic deficit) Hollice Espy, RN, Rachael Fee 02/22/2020 8:16:26 PM Disp. Time [name] Time) Disposition Final User 02/22/2020 8:20:00 PM Go to ED Now Yes Hollice Espy, RN, Rachael Fee PLEASE NOTE: All timestamps contained within this report are represented as [place] Standard Time. CONFIDENTIALTY NOTICE: This fax transmission is intended only for the addressee. It contains information that is legally privileged, confidential or otherwise protected from use or disclosure. If you are not the intended recipient, you are strictly prohibited from reviewing, disclosing, copying using or disseminating any of this information or taking any action in reliance on or regarding this information. If you have received this fax in error, please notify us immediately by  telephone so that we can arrange for its return to [place]. Phone: [phone], Toll-Free: [phone], Fax: [phone] Page: 2 of 2 Call Id: [id number] Caller Disagree/Comply Disagree Caller Understands Yes PreDisposition InappropriateToAsk Care Advice Given Per Guideline GO TO ED NOW: * You need to be seen in the Emergency Department. * Go to the ED at _________[facility] Hospital. * Leave now. Drive carefully. NOTE TO TRIAGER - DRIVING: * Another adult should drive. CARE ADVICE given per Neurologic Deficit (Adult) guideline. Comments User: Darden Palmer, RN Date/Time [name] Time): 02/22/2020 8:21:15 PM Caller refuses ED outcome at this time and states that she wants to go to the clinic tomorrow. I urged the caller to be seen in the ED now per triage outcome-refused. Number given for Sat virtual clinic. Referrals [facility] GO TO FACILITY REFUSED

## 2020-02-25 NOTE — Telephone Encounter (Signed)
Looks like 1 pm already taken. So 820 tomorrow.   Sending to Selena Batten also since she may need to be triaged as she declined ealier.   She left ED the other day.

## 2020-02-25 NOTE — Telephone Encounter (Signed)
Nurse Assessment Nurse: Mayford Knife, RN, Sharyl Nimrod Date/Time (Eastern Time): 02/23/2020 7:29:21 AM Confirm and document reason for call. If symptomatic, describe symptoms. ---Caller states she has high BP 170/107, vomiting, and a migraine. Went to ER last night and just needs the number to the virtual appt line. Declines triage Has the patient had close contact with a person known or suspected to have the novel coronavirus illness OR traveled / lives in area with major community spread (including international travel) in the last 14 days from the onset of symptoms? * If Asymptomatic, screen for exposure and travel within the last 14 days. ---No Does the patient have any new or worsening symptoms? ---No Please document clinical information provided and list any resource used. ---Micah Flesher to ER last n ight need to virtual appt line number. [phone] Disp. Time [name] Time) Disposition Final User 02/23/2020 7:31:49 AM Clinical Call Yes Mayford Knife, RN, [facility] Referrals [facility]

## 2020-02-25 NOTE — Telephone Encounter (Signed)
Spoke with patient regarding symptoms. Patient reports high blood pressure and headache x 5 days, nausea x 10 days.  Compliant with medications, denies change in diet/salt intake.  Patient requesting new neurologist and cardiologist, and for "all providers to review medications and determine best plan for me".   Patient has appt with PCP on 02/26/2020, 40 minutes.  Advised to go to ER with chest pain/SOB.

## 2020-02-25 NOTE — Telephone Encounter (Signed)
Opened to review and to make sure she was scheduled as she left ED.

## 2020-02-25 NOTE — Telephone Encounter (Signed)
Schedule full for today , pt scheduled for tomorrow with 40 mins

## 2020-02-25 NOTE — Telephone Encounter (Signed)
Pt is coming in tomorrow at 120 for a 40 min appt.

## 2020-02-25 NOTE — Telephone Encounter (Signed)
Do you want to see her in office or virtual

## 2020-02-25 NOTE — Telephone Encounter (Signed)
Called pt and lvm to return call.  

## 2020-02-26 ENCOUNTER — Other Ambulatory Visit: Payer: Self-pay

## 2020-02-26 ENCOUNTER — Ambulatory Visit: Payer: Managed Care, Other (non HMO) | Admitting: Medical

## 2020-02-26 ENCOUNTER — Telehealth: Payer: Self-pay

## 2020-02-26 ENCOUNTER — Encounter: Payer: Self-pay | Admitting: Medical

## 2020-02-26 VITALS — BP 162/98 | HR 86 | Temp 97.7°F | Resp 18 | Ht 67.0 in | Wt 155.4 lb

## 2020-02-26 DIAGNOSIS — H538 Other visual disturbances: Secondary | ICD-10-CM

## 2020-02-26 DIAGNOSIS — I10 Essential (primary) hypertension: Secondary | ICD-10-CM | POA: Diagnosis not present

## 2020-02-26 DIAGNOSIS — G43809 Other migraine, not intractable, without status migrainosus: Secondary | ICD-10-CM

## 2020-02-26 DIAGNOSIS — E785 Hyperlipidemia, unspecified: Secondary | ICD-10-CM | POA: Diagnosis not present

## 2020-02-26 DIAGNOSIS — H814 Vertigo of central origin: Secondary | ICD-10-CM | POA: Diagnosis not present

## 2020-02-26 LAB — CBC WITH DIFFERENTIAL/PLATELET
Absolute Monocytes: 480 cells/uL (ref 200–950)
Basophils Absolute: 22 cells/uL (ref 0–200)
Basophils Relative: 0.5 %
Eosinophils Absolute: 48 cells/uL (ref 15–500)
Eosinophils Relative: 1.1 %
HCT: 36.2 % (ref 35.0–45.0)
Hemoglobin: 11.8 g/dL (ref 11.7–15.5)
Lymphs Abs: 1522 cells/uL (ref 850–3900)
MCH: 27.4 pg (ref 27.0–33.0)
MCHC: 32.6 g/dL (ref 32.0–36.0)
MCV: 84.2 fL (ref 80.0–100.0)
MPV: 9.5 fL (ref 7.5–12.5)
Monocytes Relative: 10.9 %
Neutro Abs: 2328 cells/uL (ref 1500–7800)
Neutrophils Relative %: 52.9 %
Platelets: 254 10*3/uL (ref 140–400)
RBC: 4.3 10*6/uL (ref 3.80–5.10)
RDW: 12.1 % (ref 11.0–15.0)
Total Lymphocyte: 34.6 %
WBC: 4.4 10*3/uL (ref 3.8–10.8)

## 2020-02-26 LAB — COMPREHENSIVE METABOLIC PANEL
AG Ratio: 1.6 (calc) (ref 1.0–2.5)
ALT: 21 U/L (ref 6–29)
AST: 15 U/L (ref 10–35)
Albumin: 3.9 g/dL (ref 3.6–5.1)
Alkaline phosphatase (APISO): 92 U/L (ref 37–153)
BUN: 16 mg/dL (ref 7–25)
CO2: 28 mmol/L (ref 20–32)
Calcium: 8.9 mg/dL (ref 8.6–10.4)
Chloride: 106 mmol/L (ref 98–110)
Creat: 0.59 mg/dL (ref 0.50–1.05)
Globulin: 2.5 g/dL (calc) (ref 1.9–3.7)
Glucose, Bld: 107 mg/dL — ABNORMAL HIGH (ref 65–99)
Potassium: 4.1 mmol/L (ref 3.5–5.3)
Sodium: 141 mmol/L (ref 135–146)
Total Bilirubin: 0.3 mg/dL (ref 0.2–1.2)
Total Protein: 6.4 g/dL (ref 6.1–8.1)

## 2020-02-26 MED ORDER — ACETAMINOPHEN-CODEINE 300-30 MG PO TABS
1.0000 | ORAL_TABLET | Freq: Four times a day (QID) | ORAL | 0 refills | Status: DC | PRN
Start: 2020-02-26 — End: 2020-03-20

## 2020-02-26 MED ORDER — TRIAMTERENE-HCTZ 37.5-25 MG PO TABS: 1.0000 | ORAL_TABLET | Freq: Every day | ORAL | 0 refills | Status: DC

## 2020-02-26 MED ORDER — TIZANIDINE HCL 4 MG PO CAPS: ORAL_CAPSULE | ORAL | 0 refills | Status: DC

## 2020-02-26 NOTE — Progress Notes (Addendum)
Subjective:    Patient ID: Carla West, female    DOB: 12-Mar-1961, 59 y.o.   MRN: [id number]  HPI Patient is in today for follow-up.  Last seen in office about 2 weeks ago for wellness exam.  At that time her blood pressure was 164/113.  Advised her to increase her losartan to 50mg  daily.  Previously was on 25 mg daily.  She also was on amlodipine 10 mg a day and metoprolol XL 100 mg daily.  At time of visit patient had no gross motor or sensory function deficits.   On 02/15/2020 after hours my chat  messages and saw that patient was asking for migraine headache medication.  I remember her blood pressure was quite high at the time of her last visit and was hesitant to prescribe medication randomly in the setting she only had migraine.  Particularly in light of her very high blood pressure on last visit.  Below is my response to patient's via MyChart.  "I was very busy today. I am just know able to respond to your note of ha, vomiting and diarrhea.  Did you take axert?  What is your blood pressure. HA and vomiting is worrisome. Your bp was high yesterday. Bit risky just to give med for ha if your bp is still high or higher. Your do have cardiovascular history.(so in back of mind have to think about stroke?)  So you may need to see ED as we have long weekend approaching.   How bad is your ha? Need to know bp?  Thanks. Waiting for update."   Early last week on February 19, 2020  after the weekend I never got update on patient's blood pressure reading as requested. I then contacted our staff to call patient to inquire about her blood pressure level.  I did send Zofran to her pharmacy and attempt to help alleviate some of her symptoms.  Explained that she had severe signs and symptoms as she reported on Friday then urgent care or ED.  Also in my note I stated I was willing to see patient in the office provided no various potential Covid type symptoms.  Staff left voicemail for  patient that same day.  On February 22, 2020 patient did go to the emergency department and after a long wait left.  Pertinent emergency department staff  "Pt and pts sister concerned about pt waiting in the waiting area, the wait time and process was explained to the pts sister. Pt then came up and stated "I want to know who is on staffing tonight, you know what I will just file a complaint it's okay this is terrible I am leaving". This NT advised to return if symptoms do begin to get worse. Pt exited ED with sister."   I got update yesterday the patient had called in.  I asked staff to call and get patient scheduled for 1PM that afternoon.  However that slot can take in very quickly and my schedule is full.  In addition I do not think patient could have come to our office at that time anyway.  Patient was scheduled today at 120.  My concern since Sept  third has been that patient has cardiovascular risk factors, recent have blood pressure and history of migraine.  Need to know BP levels as well as assess neurologic status before treatment.  Not aware of patient's BP reading until yesterday.  Never got September 3 update BP.   Pt states she never saw  02-15-2020 my chart message. She explains sometimes will get blurred vision and therefor she did not read message.   Pt tells me she had to leave due about 5 or more hour wait.   She is getting HA and dizziness. Daily. Often is first in the morning. Sometimes will vomit.  Pt states her fingers tighting up at times. Contact and difficult to move.    Review of Systems  Constitutional: Negative for chills, fatigue and fever.  HENT: Negative for congestion.   Eyes: Positive for visual disturbance.  Respiratory: Negative for cough, chest tightness and shortness of breath.   Cardiovascular: Negative for palpitations.  Gastrointestinal: Positive for vomiting. Negative for abdominal pain and blood in stool.       Some vomiting intermittent last 4  days with ha.  Musculoskeletal: Negative for back pain, neck pain and neck stiffness.       Intermittent contaction of hands with bad ha.  Skin: Negative for rash.  Neurological: Positive for dizziness and headaches.  Hematological: Negative for adenopathy. Does not bruise/bleed easily.  Psychiatric/Behavioral: Negative for behavioral problems. The patient is not nervous/anxious.     Past Medical History:  Diagnosis Date  . Abnormal Pap smear of cervix 08/03/2015  . Benign essential HTN 08/03/2015  . Graves disease   . History of chicken pox   . Migraine headache 08/03/2015     Social History   Socioeconomic History  . Marital status: Divorced    Spouse name: Not on file  . Number of children: Not on file  . Years of education: Not on file  . Highest education level: Not on file  Occupational History  . Not on file  Tobacco Use  . Smoking status: Never Smoker  . Smokeless tobacco: Never Used  Vaping Use  . Vaping Use: Never used  Substance and Sexual Activity  . Alcohol use: No  . Drug use: No  . 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 Not on file  Social History Narrative  . Not on file   Social Determinants of Health   Financial Resource Strain:   . Difficulty of Paying Living Expenses: Not on file  Food Insecurity:   . Worried About Programme researcher, broadcasting/film/video in the Last Year: Not on file  . Ran Out of Food in the Last Year: Not on file  Transportation Needs:   . Lack of Transportation (Medical): Not on file  . Lack of Transportation (Non-Medical): Not on file  Physical Activity:   . Days of Exercise per Week: Not on file  . Minutes of Exercise per Session: Not on file  Stress:   . Feeling of Stress : Not on file  Social Connections:   . Frequency of Communication with Friends and Family: Not on file  . Frequency of Social Gatherings with Friends and Family: Not on  file  . Attends Religious Services: Not on file  . Active Member of Clubs or Organizations: Not on file  . Attends Banker Meetings: Not on file  . Marital Status: Not on file  Intimate Partner Violence:   . Fear of Current or Ex-Partner: Not on file  . Emotionally Abused: Not on file  . Physically Abused: Not on file  . Sexually Abused: Not on file    Past Surgical History:  Procedure Laterality Date  . CORONARY STENT INTERVENTION N/A 02/27/2019   Procedure: CORONARY  STENT INTERVENTION;  Surgeon: Kathleene Hazel, MD;  Location: [facility] INVASIVE CV LAB;  Service: Cardiovascular;  Laterality: N/A;  . LEFT HEART CATH AND CORONARY ANGIOGRAPHY N/A 02/27/2019   Procedure: LEFT HEART CATH AND CORONARY ANGIOGRAPHY;  Surgeon: Kathleene Hazel, MD;  Location: [facility] INVASIVE CV LAB;  Service: Cardiovascular;  Laterality: N/A;  . TUBAL LIGATION      Family History  Problem Relation Age of Onset  . Hypertension Mother   . Heart disease Mother   . Hyperlipidemia Father   . Breast cancer Maternal Grandmother   . Hyperlipidemia Sister   . Other Brother        plan crash in [employer]  . Migraines Son   . Heart disease Brother        CHF, arrythmia  . Hypertension Brother   . Gout Brother   . Hypertension Brother     Allergies  Allergen Reactions  . Elavil [Amitriptyline Hcl] Other (See Comments)    Jittery..the patient states she has not had any side effects     Current Outpatient Medications on File Prior to Visit  Medication Sig Dispense Refill  . almotriptan (AXERT) 12.5 MG tablet Take 1 tablet by mouth twice daily as needed for migraine. May repeat in 2 hours if needed 20 tablet 0  . amLODipine (NORVASC) 10 MG tablet Take 1 tablet (10 mg total) by mouth daily. 90 tablet 1  . aspirin EC 81 MG tablet Take 1 tablet (81 mg total) by mouth daily. 90 tablet 3  . atorvastatin (LIPITOR) 80 MG tablet Take 1 tablet (80 mg total) by mouth daily at 6 PM. 90 tablet 3  .  dicyclomine (BENTYL) 20 MG tablet Take 1 tablet (20 mg total) by mouth 4 (four) times daily -  before meals and at bedtime. 120 tablet 3  . hydrOXYzine (ATARAX/VISTARIL) 25 MG tablet 1 tab po q hs prn insomnia 90 tablet 1  . losartan (COZAAR) 50 MG tablet Take 1 tablet (50 mg total) by mouth daily. 90 tablet 0  . methimazole (TAPAZOLE) 10 MG tablet Take 10 mg in a.m. and 10 mg in the evening, with meals 90 tablet 2  . metoprolol succinate (TOPROL-XL) 100 MG 24 hr tablet Take 1 tablet (100 mg total) by mouth daily. Take with or immediately following a meal. 90 tablet 3  . ondansetron (ZOFRAN ODT) 8 MG disintegrating tablet Take 1 tablet (8 mg total) by mouth every 8 (eight) hours as needed for nausea or vomiting. 20 tablet 0  . promethazine (PHENERGAN) 25 MG tablet Take 1 tablet (25 mg total) by mouth every 6 (six) hours as needed for nausea or vomiting. (Patient not taking: Reported on 02/13/2020) 30 tablet 0  . sodium chloride (OCEAN) 0.65 % SOLN nasal spray Place 1 spray into both nostrils 2 (two) times daily as needed for congestion. (Patient not taking: Reported on 10/01/2019) 30 mL 6  . ticagrelor (BRILINTA) 90 MG TABS tablet Take 1 tablet (90 mg total) by mouth 2 (two) times daily. 180 tablet 3   No current facility-administered medications on file prior to visit.    There were no vitals taken for this visit.      Objective:   Physical Exam  General Mental Status- Alert. General Appearance- Not in acute distress.   Skin General: Color- Normal Color. Moisture- Normal Moisture.  Neck Carotid Arteries- Normal color. Moisture- Normal Moisture. No carotid bruits. No JVD.  Chest and Lung Exam Auscultation: Breath Sounds:-Normal.  Cardiovascular  Auscultation:Rythm- Regular. Murmurs & Other Heart Sounds:Auscultation of the heart reveals- No Murmurs.  Abdomen Inspection:-Inspeection Normal. Palpation/Percussion:Note:No mass. Palpation and Percussion of the abdomen reveal- Non Tender,  Non Distended + BS, no rebound or guarding.    Neurologic Cranial Nerve exam:- CN III-XII intact(No nystagmus), symmetric smile. Drift Test:- No drift. Finger to Nose:- Normal/Intact Strength:- 5/5 equal and symmetric strength both upper and lower extremities.     Assessment & Plan:  You have had intermittent moderate to severe headaches with reports of blurred vision, nausea, vomiting and contraction of right upper extremity hands/digits.  You have recently severe high blood pressure readings along with the symptoms and history of cardiovascular disease.  For this reason I put in CT of head without contrast.  I do think this would be best in light of the fact that wait times in the emergency department are excessive recently due to pandemic.  Based on symptoms that he described on past Saturday I think CT would have been ordered.  Awaiting prior authorization from your insurance.  Hopefully they will approve that.  Adding Maxide to your BP med regimen.  Continue amlodipine, losartan and beta-blocker.  CBC and CMP today.  Will need to monitor your potassium while on diuretic.  Placing referral to Dr. Daisy Blossom for your migraine headaches.  Continue Axert.  You have some trapezius pain on exam so we will make tinazadine  available if tension headache features occur.  Also prescribing Tylenol with codeine.  Not ideal treatment for migraine but this treatment should not increase her blood pressure.  We will see if CT of head approved.  Even if CT were to come back negative would recommend ED evaluation again if you have any gross motor or sensory function deficits.  Bp reading late Thursday or Friday.  Follow up 7-10 days.  Time spent with patient today was 45+  minutes which consisted of chart review, discussing diagnosis, work up, treatment and documentation.

## 2020-02-26 NOTE — Patient Instructions (Addendum)
You have had intermittent moderate to severe headaches with reports of blurred vision, nausea, vomiting and contraction of right upper extremity hands/digits.  You have recently severe high blood pressure readings along with the symptoms and history of cardiovascular disease.  For this reason I put in CT of head without contrast.  I do think this would be best in light of the fact that wait times in the emergency department are excessive recently due to pandemic.  Based on symptoms that you described on past Saturday I think CT would have been ordered.  Awaiting prior authorization from your insurance.  Hopefully they will approve that.  Adding Maxide to your BP med regimen.  Continue amlodipine, losartan and beta-blocker.  CBC and CMP today.  Will need to monitor your potassium while on diuretic.  Placing referral to Dr. Daisy Blossom for your migraine headaches.  Continue Axert.  You have some trapezius pain on exam so we will make tinazadine  available if tension headache features occur.  Also prescribing Tylenol with codeine.  Not ideal treatment for migraine but this treatment should not increase her blood pressure.  We will see if CT of head approved.  Even if CT were to come back negative would recommend ED evaluation again if you have any gross motor or sensory function deficits.  Bp reading late Thursday or Friday.  Follow up 7-10 days.

## 2020-02-26 NOTE — Telephone Encounter (Signed)
Please change to tablets

## 2020-02-26 NOTE — Telephone Encounter (Signed)
Insurance does not cover tizanidine 4mg  capsules, okay to change to tablets?

## 2020-02-27 ENCOUNTER — Ambulatory Visit (HOSPITAL_BASED_OUTPATIENT_CLINIC_OR_DEPARTMENT_OTHER)
Admission: RE | Admit: 2020-02-27 | Discharge: 2020-02-27 | Disposition: A | Discharge status: Home / Self Care | Payer: Managed Care, Other (non HMO) | Source: Ambulatory Visit | Attending: Medical | Admitting: Medical

## 2020-02-27 ENCOUNTER — Telehealth: Payer: Self-pay | Admitting: Neurology

## 2020-02-27 DIAGNOSIS — I10 Essential (primary) hypertension: Secondary | ICD-10-CM | POA: Insufficient documentation

## 2020-02-27 DIAGNOSIS — H814 Vertigo of central origin: Secondary | ICD-10-CM | POA: Diagnosis present

## 2020-02-27 DIAGNOSIS — H538 Other visual disturbances: Secondary | ICD-10-CM | POA: Insufficient documentation

## 2020-02-27 MED ORDER — TIZANIDINE HCL 4 MG PO TABS: 4.0000 mg | ORAL_TABLET | Freq: Every evening | ORAL | 0 refills | Status: DC | PRN

## 2020-02-27 NOTE — Telephone Encounter (Signed)
Changed to tablets

## 2020-02-27 NOTE — Telephone Encounter (Signed)
Carla West/ArianaRamon Dredge West sent urgent referral to [facility] bc patient had migraine with vomiting for several weeks.  Patient has a history of migraines.  Patient was seen at the ED but was upset because she had to wait so long and so she left without getting evaluated.  Dr. [name] and I reviewed the consult today and expressed to Dr. [name] if this has been refractory, severe and with vomiting, with elevated BP, she needs emergent imaging and is something that we cannot provide in this office.  CT of the head was subsequently negative today.  PCP has asked for urgent consult. Of note, patient NO SHOWED an appointment with me in February.  Patient told Carla West that she was not aware of the appointment. Our notes indicate that she was scheduled over the phone and also she confirmed on the phone the day before. She has a pattern of 26% no show on Epic.  Since patient's CT of the head was normal today and Carla West has texted "so far no nause or vomitng today and ha is minimal last talked to her", this is no longer urgent. She can see first available provider and it does not need to be me. Please call patient and let her know we are happy to see her for first available provider which may not be me. Also you may want to inform her that our policy is dismissal for 2 no-shows thanks.  ([name]/[name] FYI asking new referrals to call her, please transfer to them if she calls the office)

## 2020-02-28 ENCOUNTER — Ambulatory Visit
Admission: RE | Admit: 2020-02-28 | Discharge: 2020-02-28 | Disposition: A | Discharge status: Home / Self Care | Payer: Managed Care, Other (non HMO) | Source: Ambulatory Visit | Attending: Medical | Admitting: Medical

## 2020-02-28 ENCOUNTER — Telehealth: Payer: Self-pay | Admitting: Medical

## 2020-02-28 ENCOUNTER — Encounter: Payer: Self-pay | Admitting: Medical

## 2020-02-28 ENCOUNTER — Other Ambulatory Visit: Payer: Self-pay

## 2020-02-28 DIAGNOSIS — I10 Essential (primary) hypertension: Secondary | ICD-10-CM

## 2020-02-28 DIAGNOSIS — I252 Old myocardial infarction: Secondary | ICD-10-CM

## 2020-02-28 DIAGNOSIS — Z1231 Encounter for screening mammogram for malignant neoplasm of breast: Secondary | ICD-10-CM

## 2020-02-28 MED ORDER — TRIAMTERENE-HCTZ 37.5-25 MG PO TABS: 1.0000 | ORAL_TABLET | Freq: Every day | ORAL | 0 refills | Status: DC

## 2020-02-28 MED ORDER — CLONIDINE HCL 0.1 MG PO TABS: 0.1000 mg | ORAL_TABLET | Freq: Two times a day (BID) | ORAL | 0 refills | Status: DC

## 2020-02-28 NOTE — Telephone Encounter (Signed)
Referral to cardiologist placed. 

## 2020-02-28 NOTE — Telephone Encounter (Signed)
Rx clonidine sent to pt pharmacy.

## 2020-02-29 ENCOUNTER — Ambulatory Visit (HOSPITAL_BASED_OUTPATIENT_CLINIC_OR_DEPARTMENT_OTHER): Payer: Managed Care, Other (non HMO)

## 2020-02-29 ENCOUNTER — Ambulatory Visit (INDEPENDENT_AMBULATORY_CARE_PROVIDER_SITE_OTHER): Payer: Managed Care, Other (non HMO) | Admitting: Physician Assistant

## 2020-02-29 ENCOUNTER — Encounter: Payer: Self-pay | Admitting: Physician Assistant

## 2020-02-29 ENCOUNTER — Telehealth: Payer: Self-pay

## 2020-02-29 ENCOUNTER — Emergency Department (HOSPITAL_COMMUNITY)
Admission: EM | Admit: 2020-02-29 | Discharge: 2020-02-29 | Disposition: A | Discharge status: Home / Self Care | Payer: Managed Care, Other (non HMO) | Attending: Emergency Medicine | Admitting: Emergency Medicine

## 2020-02-29 ENCOUNTER — Encounter (HOSPITAL_COMMUNITY): Payer: Self-pay

## 2020-02-29 ENCOUNTER — Other Ambulatory Visit: Payer: Self-pay

## 2020-02-29 VITALS — BP 180/100 | HR 79 | Ht 67.0 in | Wt 151.8 lb

## 2020-02-29 DIAGNOSIS — R519 Headache, unspecified: Secondary | ICD-10-CM | POA: Diagnosis not present

## 2020-02-29 DIAGNOSIS — Z5321 Procedure and treatment not carried out due to patient leaving prior to being seen by health care provider: Secondary | ICD-10-CM | POA: Diagnosis not present

## 2020-02-29 DIAGNOSIS — Z20822 Contact with and (suspected) exposure to covid-19: Secondary | ICD-10-CM | POA: Diagnosis not present

## 2020-02-29 DIAGNOSIS — R531 Weakness: Secondary | ICD-10-CM | POA: Diagnosis not present

## 2020-02-29 DIAGNOSIS — I10 Essential (primary) hypertension: Secondary | ICD-10-CM | POA: Insufficient documentation

## 2020-02-29 DIAGNOSIS — E785 Hyperlipidemia, unspecified: Secondary | ICD-10-CM

## 2020-02-29 DIAGNOSIS — R112 Nausea with vomiting, unspecified: Secondary | ICD-10-CM | POA: Diagnosis present

## 2020-02-29 DIAGNOSIS — I2510 Atherosclerotic heart disease of native coronary artery without angina pectoris: Secondary | ICD-10-CM

## 2020-02-29 DIAGNOSIS — E0590 Thyrotoxicosis, unspecified without thyrotoxic crisis or storm: Secondary | ICD-10-CM

## 2020-02-29 LAB — COMPREHENSIVE METABOLIC PANEL
ALT: 24 U/L (ref 0–44)
AST: 21 U/L (ref 15–41)
Albumin: 3.8 g/dL (ref 3.5–5.0)
Alkaline Phosphatase: 92 U/L (ref 38–126)
Anion gap: 13 (ref 5–15)
BUN: 9 mg/dL (ref 6–20)
CO2: 23 mmol/L (ref 22–32)
Calcium: 9.6 mg/dL (ref 8.9–10.3)
Chloride: 105 mmol/L (ref 98–111)
Creatinine, Ser: 0.69 mg/dL (ref 0.44–1.00)
GFR calc Af Amer: >60 mL/min (ref >60)
GFR calc non Af Amer: >60 mL/min (ref >60)
Glucose, Bld: 93 mg/dL (ref 70–99)
Potassium: 3.8 mmol/L (ref 3.5–5.1)
Sodium: 141 mmol/L (ref 135–145)
Total Bilirubin: 0.6 mg/dL (ref 0.3–1.2)
Total Protein: 7 g/dL (ref 6.5–8.1)

## 2020-02-29 LAB — I-STAT CHEM 8, ED
BUN: 11 mg/dL (ref 6–20)
Calcium, Ion: 1.12 mmol/L — ABNORMAL LOW (ref 1.15–1.40)
Chloride: 108 mmol/L (ref 98–111)
Creatinine, Ser: 0.6 mg/dL (ref 0.44–1.00)
Glucose, Bld: 91 mg/dL (ref 70–99)
HCT: 37 % (ref 36.0–46.0)
Hemoglobin: 12.6 g/dL (ref 12.0–15.0)
Potassium: 3.8 mmol/L (ref 3.5–5.1)
Sodium: 141 mmol/L (ref 135–145)
TCO2: 24 mmol/L (ref 22–32)

## 2020-02-29 LAB — CBC
HCT: 40.7 % (ref 36.0–46.0)
Hemoglobin: 12.8 g/dL (ref 12.0–15.0)
MCH: 27.1 pg (ref 26.0–34.0)
MCHC: 31.4 g/dL (ref 30.0–36.0)
MCV: 86.2 fL (ref 80.0–100.0)
Platelets: 257 10*3/uL (ref 150–400)
RBC: 4.72 MIL/uL (ref 3.87–5.11)
RDW: 12.7 % (ref 11.5–15.5)
WBC: 5.5 10*3/uL (ref 4.0–10.5)
nRBC: 0 % (ref 0.0–0.2)

## 2020-02-29 LAB — DIFFERENTIAL
Abs Immature Granulocytes: 0 10*3/uL (ref 0.00–0.07)
Basophils Absolute: 0 10*3/uL (ref 0.0–0.1)
Basophils Relative: 0 %
Eosinophils Absolute: 0 10*3/uL (ref 0.0–0.5)
Eosinophils Relative: 0 %
Lymphocytes Relative: 10 %
Lymphs Abs: 0.6 10*3/uL — ABNORMAL LOW (ref 0.7–4.0)
Monocytes Absolute: 0.2 10*3/uL (ref 0.1–1.0)
Monocytes Relative: 4 %
Neutro Abs: 4.7 10*3/uL (ref 1.7–7.7)
Neutrophils Relative %: 86 %
nRBC: 0 /100 WBC

## 2020-02-29 LAB — PROTIME-INR
INR: 1.1 (ref 0.8–1.2)
Prothrombin Time: 13.6 seconds (ref 11.4–15.2)

## 2020-02-29 LAB — I-STAT BETA HCG BLOOD, ED (MC, WL, AP ONLY): I-stat hCG, quantitative: 8.7 m[IU]/mL — ABNORMAL HIGH (ref <5)

## 2020-02-29 LAB — APTT: aPTT: 27 seconds (ref 24–36)

## 2020-02-29 MED ORDER — SODIUM CHLORIDE 0.9% FLUSH: 3.0000 mL | Freq: Once | INTRAVENOUS | Status: DC

## 2020-02-29 NOTE — Progress Notes (Signed)
Cardiology Office Note:    Date:  02/29/2020   ID:  Carla Carla West, DOB 29-Oct-1960, MRN [id number]  PCP:  Marisue BrooklynSaguier, Edward, PA-C  [facility] Cardiologist:  Reatha HarpsWesley T O'Neal, MD  [facility] Electrophysiologist:  None   Referring MD: Marisue BrooklynSaguier, Edward, PA-C   Chief Complaint  Patient presents with  . Follow-up    seen for Dr. Flora Lipps'Neal    History of Present Illness:    Carla West is a 59 y.o. female with a hx of hyperthyroidism, CAD, hypertension and hyperlipidemia.  She was initially referred to Dr. Bing MatterKrasowski for evaluation of chest pain on 01/29/2019.  Myoview obtained on 02/01/2019 showed EF 78%, no signs of ischemia, overall low risk study.  Echocardiogram performed on 02/22/2019 showed EF 60 to 65%, mild to moderate MR.  Unfortunately she was admitted with NSTEMI shortly after in September 2020.  Cardiac catheterization performed on 02/27/2019 showed 30% proximal RCA, 99% proximal left circumflex, 20% mid LAD, 40% OM 2 lesion.  Left circumflex lesion was treated with a resolute Onyx 3.5 x 12 mm DES.  Postprocedure, she was placed on aspirin and Brilinta.  Limited echocardiogram performed on the same day showed EF 60 to 65%, no regional wall motion abnormality, moderate LAE, mild to moderate MR. She was seen in the ED in August 2021 for headache, blood pressure at the time was 158/99.  Pulse 86.  O2 saturation 94%.  More recently, she went to the urgent care on 02/22/2020 with chest pain.  She was tachycardic with a heart rate of 111, she was also tachypneic at 22 breaths/min, O2 saturation 100%, blood pressure 167/99.  She was sent to the ED by urgent care for further evaluation.  While at the ED, she complained of headache with nausea and vomiting.  Unfortunately, patient left before seen by ED provider due to the long wait time brought on by the recent COVID-19 pandemic.  She was seen by her PCP on 02/26/2020, blood pressure at the time was 162/98.  CT of the head obtained at the time  showed no sign of intracranial etiology.  Blood work was essentially normal.  She was placed on triamterene-hydrochlorothiazide 37.5-25 mg for better blood pressure control.  She was also treated with tizanidine and Tylenol/codeine No. 3 for her headache.  Due to uncontrolled high blood pressure, she called her PCPs office again on 02/28/2020 and was prescribed 0.1 mg twice a day of clonidine.  She was also referred to neurology service as well.  Patient was initially scheduled dose at 2:15 PM appointment today with cardiology, however she arrived early due to how poorly she felt.  She says she has been to multiple ED recently due to her headache, nausea, shortness of breath and dizziness.  She also has severe right-sided pain started today.  On physical exam, her left eyelid was partially shot and she has a hard time fully open up the left eye.  On exam, she has bilateral weakness however no focal deficit.  She does not have any lower extremity edema.  Lung exam is clear.  Heart rate regular as well.  EKG shows normal sinus rhythm with no significant ST-T wave changes.  Patient is extremely weak and has been laying on the table for the entire duration of the visit.  She denies any obvious chest pain and has been compliant with aspirin and Brilinta.  Her daughter is at bedside who is also quite concerned about her overall symptom.  I discussed the case with DOD  Dr. Duke Salvia, we recommend EMS transport to the local ED for further evaluation.  Differential diagnosis include severe migraine versus acute CVA vs thyroid storm.  Although acute MI is possibility, however suspicion is fairly low as her nausea and vomiting unlikely to be cardiac in nature.  Given her persistently elevated blood pressure unable to be controlled through medication, our suspicion is something else is driving her blood pressure.  This will need to be evaluated in the hospital by both hospitalist service and the neurology service.  Pulse ox is  likely inaccurate due to red nail pain.   Past Medical History:  Diagnosis Date  . Abnormal Pap smear of cervix 08/03/2015  . Benign essential HTN 08/03/2015  . Graves disease   . History of chicken pox   . Migraine headache 08/03/2015    Past Surgical History:  Procedure Laterality Date  . CORONARY STENT INTERVENTION N/A 02/27/2019   Procedure: CORONARY STENT INTERVENTION;  Surgeon: Kathleene Hazel, MD;  Location: [facility];  Service: Cardiovascular;  Laterality: N/A;  . LEFT HEART CATH AND CORONARY ANGIOGRAPHY N/A 02/27/2019   Procedure: LEFT HEART CATH AND CORONARY ANGIOGRAPHY;  Surgeon: Kathleene Hazel, MD;  Location: [facility] INVASIVE CV LAB;  Service: Cardiovascular;  Laterality: N/A;  . TUBAL LIGATION      Current Medications: Current Meds  Medication Sig  . Acetaminophen-Codeine (TYLENOL/CODEINE #3) 300-30 MG tablet Take 1 tablet by mouth every 6 (six) hours as needed for pain.  Marland Kitchen almotriptan (AXERT) 12.5 MG tablet Take 1 tablet by mouth twice daily as needed for migraine. May repeat in 2 hours if needed  . amLODipine (NORVASC) 10 MG tablet Take 1 tablet (10 mg total) by mouth daily.  Marland Kitchen aspirin EC 81 MG tablet Take 1 tablet (81 mg total) by mouth daily.  Marland Kitchen atorvastatin (LIPITOR) 80 MG tablet Take 1 tablet (80 mg total) by mouth daily at 6 PM.  . cloNIDine (CATAPRES) 0.1 MG tablet Take 1 tablet (0.1 mg total) by mouth 2 (two) times daily.  Marland Kitchen dicyclomine (BENTYL) 20 MG tablet Take 1 tablet (20 mg total) by mouth 4 (four) times daily -  before meals and at bedtime.  . hydrOXYzine (ATARAX/VISTARIL) 25 MG tablet 1 tab po q hs prn insomnia  . losartan (COZAAR) 50 MG tablet Take 1 tablet (50 mg total) by mouth daily.  . methimazole (TAPAZOLE) 10 MG tablet Take 10 mg in a.m. and 10 mg in the evening, with meals  . metoprolol succinate (TOPROL-XL) 100 MG 24 hr tablet Take 1 tablet (100 mg total) by mouth daily. Take with or immediately following a meal.  . ondansetron  (ZOFRAN ODT) 8 MG disintegrating tablet Take 1 tablet (8 mg total) by mouth every 8 (eight) hours as needed for nausea or vomiting.  . promethazine (PHENERGAN) 25 MG tablet Take 1 tablet (25 mg total) by mouth every 6 (six) hours as needed for nausea or vomiting.  . sodium chloride (OCEAN) 0.65 % SOLN nasal spray Place 1 spray into both nostrils 2 (two) times daily as needed for congestion.  . ticagrelor (BRILINTA) 90 MG TABS tablet Take 1 tablet (90 mg total) by mouth 2 (two) times daily.  Marland Kitchen tiZANidine (ZANAFLEX) 4 MG tablet Take 1 tablet (4 mg total) by mouth at bedtime as needed.  . triamterene-hydrochlorothiazide (MAXZIDE-25) 37.5-25 MG tablet Take 1 tablet by mouth daily.     Allergies:   Elavil [amitriptyline hcl]   Social History   Socioeconomic History  .  Marital status: Divorced    Spouse name: Not on file  . Number of children: Not on file  . Years of education: Not on file  . Highest education level: Not on file  Occupational History  . Not on file  Tobacco Use  . Smoking status: Never Smoker  . Smokeless tobacco: Never Used  Vaping Use  . Vaping Use: Never used  Substance and Sexual Activity  . Alcohol use: No  . Drug use: No  . 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 Not on file  Social History Narrative  . Not on file   Social Determinants of Health   Financial Resource Strain:   . Difficulty of Paying Living Expenses: Not on file  Food Insecurity:   . Worried About Programme researcher, broadcasting/film/video in the Last Year: Not on file  . Ran Out of Food in the Last Year: Not on file  Transportation Needs:   . Lack of Transportation (Medical): Not on file  . Lack of Transportation (Non-Medical): Not on file  Physical Activity:   . Days of Exercise per Week: Not on file  . Minutes of Exercise per Session: Not on file  Stress:   . Feeling of Stress : Not on file  Social  Connections:   . Frequency of Communication with Friends and Family: Not on file  . Frequency of Social Gatherings with Friends and Family: Not on file  . Attends Religious Services: Not on file  . Active Member of Clubs or Organizations: Not on file  . Attends Banker Meetings: Not on file  . Marital Status: Not on file     Family History: The patient's family history includes Breast cancer in her maternal grandmother; Gout in her brother; Heart disease in her brother and mother; Hyperlipidemia in her father and sister; Hypertension in her brother, brother, and mother; Migraines in her son; Other in her brother.  ROS:   Please see the history of present illness.     All other systems reviewed and are negative.  EKGs/Labs/Other Studies Reviewed:    The following studies were reviewed today:  Cath 02/27/2019  Prox RCA lesion is 30% stenosed.  Prox Cx lesion is 99% stenosed.  Mid LAD lesion is 20% stenosed.  2nd Mrg lesion is 40% stenosed.  A drug-eluting stent was successfully placed using a STENT RESOLUTE ONYX 3.5X12.  Post intervention, there is a 0% residual stenosis.   1. Mild non-obstructive disease in the LAD and RCA 2. 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  3. Successful PTCA/DES x 1 proximal Circumflex   Recommendations: Dual anti-platelet therapy with ASA and Brilinta for one year. Continue statin and beta blocker.    EKG:  EKG is ordered today.  The ekg ordered today demonstrates normal sinus rhythm without significant ST-T wave changes.  Recent Labs: 01/30/2020: Magnesium 1.9 02/13/2020: TSH <0.01 02/26/2020: ALT 21; BUN 16; Creat 0.59; Hemoglobin 11.8; Platelets 254; Potassium 4.1; Sodium 141  Recent Lipid Panel    Component Value Date/Time   CHOL 217 (H) 02/13/2020 1627   CHOL 154 04/04/2019 1010   TRIG 63 02/13/2020 1627   HDL 78 02/13/2020 1627   HDL 83 04/04/2019  1010   CHOLHDL 2.8 02/13/2020 1627   VLDL 9 02/27/2019 0500   LDLCALC 124 (H) 02/13/2020 1627  Physical Exam:    VS:  BP (!) 180/100   Pulse 79   Ht 5\' 7"  (1.702 m)   Wt 151 lb 12.8 oz (68.9 kg)   SpO2 (!) 83%   BMI 23.78 kg/m     Wt Readings from Last 3 Encounters:  02/29/20 151 lb 12.8 oz (68.9 kg)  02/26/20 155 lb 6.4 oz (70.5 kg)  02/22/20 156 lb 1.4 oz (70.8 kg)     GEN: Appears to be weak, laying in bed. HEENT: Normal NECK: No JVD; No carotid bruits LYMPHATICS: No lymphadenopathy CARDIAC: RRR, no murmurs, rubs, gallops RESPIRATORY:  Clear to auscultation without rales, wheezing or rhonchi  ABDOMEN: Soft, non-tender, non-distended MUSCULOSKELETAL:  No edema; No deformity  SKIN: Warm and dry NEUROLOGIC:  Alert and oriented x 3 PSYCHIATRIC:  Normal affect   ASSESSMENT:    1. Intractable headache, unspecified chronicity pattern, unspecified headache type   2. Weakness   3. Coronary artery disease involving native coronary artery of native heart without angina pectoris   4. Hypertension, uncontrolled   5. Hyperlipidemia LDL goal <70   6. Hyperthyroidism    PLAN:    In order of problems listed above:  1. Intractable headache: With frequent vomiting.  Symptom has been going on periodically for the past several weeks.  Patient has been seen in at least 2 emergency rooms.  Unfortunately, she did not stay for her last examination on 9/10 due to prolonged wait.  Potential differential diagnosis include acute CVA, severe migraine versus thyroid storm.  Possibility of acute MI is fairly low however not nonexistent.  We recommended EMS transport to the local ED.  2. Weakness: Unclear cause, see #1 migraine with recommendation to be evaluated at local ED  3. CAD: Compliant with aspirin and Brilinta.  Last cardiac catheterization in September 2020, DES to left circumflex artery  4. Hypertension: Uncontrolled high blood pressure.  Will need a renal artery Doppler in the  hospital.  Blood pressure has been increased twice recently without improvement.  Question if related to his intractable headache  5. Hyperlipidemia: Continue statin therapy  6. Hypothyroidism: Managed by endocrinology.   Medication Adjustments/Labs and Tests Ordered: Current medicines are reviewed at length with the patient today.  Concerns regarding medicines are outlined above.  Orders Placed This Encounter  Procedures  . EKG 12-Lead   No orders of the defined types were placed in this encounter.   There are no Patient Instructions on file for this visit.   12-11-1966, Ramond Dial  02/29/2020 2:13 PM    [facility]

## 2020-02-29 NOTE — Telephone Encounter (Signed)
She needs to call 911. EMS. That would be most direct route to approtiate ED and avoid wait. She may need mri. Already had CT which was negative. Her bp is not coming down. I don't want her to have to wait hours to be seen in ED. She left ED the other day after waiting hours.

## 2020-02-29 NOTE — Telephone Encounter (Signed)
Call back # [phone]  Patient states she when to her cardiology and they called the ambulance. She is at [facility] blood pressure was 220/101, now is 189/107. Patient states the emergency doctor would like to do a CT, however patient states she had one already. Patient would like to get an MRI and is wondering if you could call the ED.

## 2020-02-29 NOTE — ED Triage Notes (Signed)
Pt bib gcems w/ c/o from HTN, n/v, generalized weakness, headache x 2 weeks. Pt has multiple negative covid tests during this time period. EMS stroke screen neg. AOx4, neuro intact. BP 190/114, HR 62, 99% SPO2. EKG unremarkable.

## 2020-02-29 NOTE — Patient Instructions (Signed)
Medication Instructions:  Patient sent to ED by EMS *If you need a refill on your cardiac medications before your next appointment, please call your pharmacy*  Lab Work: Patient sent to ED by EMS If you have labs (blood work) drawn today and your tests are completely normal, you will receive your results only by: Marland Kitchen MyChart Message (if you have MyChart) OR . A paper copy in the mail If you have any lab test that is abnormal or we need to change your treatment, we will call you to review the results.  Testing/Procedures: Patient sent to ED by EMS  Follow-Up: At [facility], you and your health needs are our priority.  As part of our continuing mission to provide you with exceptional heart care, we have created designated Provider Care Teams.  These Care Teams include your primary Cardiologist (physician) and Advanced Practice Providers (APPs -  Physician Assistants and Nurse Practitioners) who all work together to provide you with the care you need, when you need it.    Your next appointment:   Patient sent to ED by EMS  The format for your next appointment:   Patient sent to ED by EMS  Provider:   Patient sent to ED by EMS  Other Instructions

## 2020-02-29 NOTE — Telephone Encounter (Signed)
Was busy all day at ED. Could not call over. Pt should follow process. ED/hosptial would likely try to get bp under control, evaluate neurologic status, maybe repeat ct, consider mri, maybe expand work up cause for resistant hypertension, possible admit and get specialist consults.   Pt needed to be in ED and [facility].

## 2020-02-29 NOTE — ED Notes (Signed)
Pt requested remove IV and left emergency department

## 2020-02-29 NOTE — Telephone Encounter (Signed)
Called patient and she stated she doesn't want to ride in the EMS and she will go see cardiology today at 1 and see what he says.

## 2020-02-29 NOTE — Telephone Encounter (Signed)
Pt states she has been in contact with you about her migraines.  She woke up this morning with BP at 167/102.  She has been been vomiting w/ a migraine.  She wants to know if she can come in for a migraine cocktail or does she need to go to ER for it before the weekend gets here.  She stated she has a 1 pm appt with a cardiologist you set her up with.

## 2020-03-01 ENCOUNTER — Telehealth: Payer: Self-pay | Admitting: Family Medicine

## 2020-03-01 ENCOUNTER — Encounter: Payer: Self-pay | Admitting: Medical

## 2020-03-01 NOTE — Telephone Encounter (Signed)
I agree pt needs to been seen in the ED. I sent my chart message explaining in detail need to be evaluated in ED etc. See my chart message.

## 2020-03-01 NOTE — Telephone Encounter (Signed)
Took 2 phone calls from triage nurse last night around 9 PM; documentation should be coming from that. In summary, 59 year old female with history of hypertension, prior MI, migraine headaches called triage due to new onset left eye pain.  Chart review shows she has had ongoing work-up for headaches over the last year.  She was in the emergency room several months ago for same.  Yesterday, she was also seen her cardiologist for hypertension and was in hypertensive urgency.  She was sent to the emergency room but there was an extensive wait and she left without being seen.  Since then she has developed left eye pain.  She denied diplopia, vision changes, dysarthria or paresis.  She has been having ongoing nausea and vomiting thought to be related to her headaches for the last several days as well.  She was in the emergency room earlier this month.  I reviewed prior PCP notes, neurology consult recommendation.  She is being set up to see neurology.  She has had a normal brain CT.  Her cardiologist was concerned regarding her blood pressure.  She was given medicine and her blood pressure did lower but she reports it was elevated to the triage nurse.  She denies chest pain.  She denies shortness of breath.  Triage nurse reports she was highly irritated and disrespectful on the phone.  She is demanding that she be admitted from home directly to the hospital.  She does not want to emergency room.  Instructions given.  Given hypertensive crisis and new onset eye pain, he emergency evaluation is strongly recommended.  This could all be related to her headaches.  Chart was reviewed and she does have pain medication and antinausea medication at home.  She is able to use this.  She is on antihypertensives.  Unfortunately, I cannot directly admit this patient.  Triage nurse was to notify patient.

## 2020-03-03 ENCOUNTER — Telehealth: Payer: Self-pay | Admitting: *Deleted

## 2020-03-03 ENCOUNTER — Telehealth: Payer: Self-pay | Admitting: Medical

## 2020-03-03 NOTE — Telephone Encounter (Signed)
Who Is Calling Patient / Member / Family / Caregiver Call Type Triage / Clinical Caller Name Rosalio Macadamia Relationship To Patient Sibling Return Phone Number Please choose phone number Chief Complaint BLOOD PRESSURE HIGH - Systolic (top number) 200 or greater (with symptoms) Reason for Call Request to Speak to a Physician Initial Comment Caller is calling for her sister. Her BP was 220 systolic today. She was taken to the ED and was told there is an 18 hour wait, so she left. She has a hx of Graves disease and is vomiting. She wants the doctor to directly admit her to the hospital. Additional Comment Caller is very pushy. Translation No Nurse Assessment Nurse: Wiser, Charity fundraiser, Heidi Date/Time (Eastern Time): 02/29/2020 9:13:19 PM Confirm and document reason for call. If symptomatic, describe symptoms. ---Caller is calling for her sister. Her BP was 220/107 today at MD office. EMS was called and patient was transported to [facility] in [place] . Patient was advised it would be an 18 hour wait. Patient left the ED and is calling to request that MD directly admit her to the hospital : [facility] in [place]. Current B/P 180/107. Patient has developed vomiting/ diarrhea /body aches. 8/10 pain in left eye just started as well. Negative Covid test on Monday. Graves disease. Chest pain is occurring intermittently.   Nurse Assessment Is this a behavioral health or substance abuse call? ---No Guidelines Guideline Title Affirmed Question Affirmed Notes Nurse Date/Time [name] Time) Chest Pain [1] Chest pain lasts > 5 minutes AND [2] age > 77 Wiser, RN, Heidi 02/29/2020 9:20:39 PM Disp. Time [name] Time) Disposition Final User 02/29/2020 9:09:16 PM Send to Urgent Tessa Lerner 02/29/2020 9:44:12 PM Called On-Call Provider Wiser, RN, Heidi 02/29/2020 9:57:47 PM Called On-Call Provider Wiser, RN, Heidi 02/29/2020 9:24:48 PM Call EMS 911 Now Yes Wiser, RN, Heidi Caller  Disagree/Comply Disagree Caller Understands Yes PreDisposition Call Doctor Care Advice Given Per Guideline CALL EMS 911 NOW: CARE ADVICE given per Chest Pain (Adult) guideline   Paging DoctorName Phone DateTime Result/Outcome Message Type Notes Asencion Partridge- MD [phone] 02/29/2020 9:44:12 PM Called On Call Provider - Reached Doctor Paged Asencion Partridge- MD 02/29/2020 9:55:45 PM Spoke with On Call - General Message Result Dr. Asencion Partridge notified of symptoms/outcome/patient being transported to ED earlier today for evaluation via EMS/patient refusal to call 911 now. MD reviewed cardiology note from today. Provider stated patient could contact Cardiology office as she was evaluated at that office today, however, provider advised that patient call 911 or go to ED for evaluation. Provider stated it is not possible to direct admit a patient from home. If patient continues to refuse ED evaluation provider recommends that she be seen tomorrow at [facility] for elevated Blood pressure/headache. Patient and family advised of provider response. Information regarding evaluation at UC was not given as patient became upset. Patient stated she wanted the nurse to contact the on call provider to notify her that she is going to call 911 now, however, she expects the physician to call the ED [facility]) and follow-up to assist with her been admitted.   Asencion Partridge- MD [phone] 02/29/2020 9:57:47 PM Called On Call Provider - Reached Doctor Paged Asencion Partridge- MD 02/29/2020 10:05:10 PM Spoke with On Call - General Message Result Dr. Mardelle Matte given patient's message. Provider advised that she does not have the power as the on call provider to have the patient admitted. Patient's sister and daughter were notified of provider response. Family members encouraged  to have patient stay at the ED for evaluation no matter the wait as her symptoms a serious, and she needs to be  evaluated ti be sure she is safe.

## 2020-03-03 NOTE — Telephone Encounter (Addendum)
I discussed pt case with Dr. Abner Greenspan this weekend and again this morning. Summarized recent 2 weeks and pt non compliance  as well as desire for direct admit which we can't do.(primarily due to pandemic, lack of beds/full rooms etc). Showed her comments when she talked with Dr.on call and her comments to me by my chart    Notified supervising DO of recent events. Both recommend dismissing pt from practice due to non compliance. We have concern noncompliance will eventually lead to severe disability or death. We acknowledge difficult waiting in ED but with bp very high in 200/100 at times this was required. Cardiologist sent her to ED when they evaluated but she left.She left ED twice  within 2 weeks ago after waiting an excessive time per her report.   Now I am forced to try to do work up and manage as outpatient since she has refused to wait in ED. Hopefully bp will remain somewhat reasonable and she won't have stroke or MI symptoms. Will dismiss pt as instructed by supervising MD advisement but attempt to safest manner over next month.  I called pt to get her back in office for neurologic exam. Make sure she is taking meds that I have prescribed. Most recently clonidine. After discussing with Dr. Rogelia Rohrer can add hydralazine 25 mg tid.   Explained to pt will try to get renal US study, urine metanephrines, refer to [facility] and cardiology.   After reviewing neurologist notes and pt leaving ED not sure cardiology will see her again if bp extreme high and she is unwilling to go to ED.  Neurologist indicating might be able to get her in immediateley.   Pt states she is done with [facility] and wants to be [facility].  Pt bp is 160/98. She states this is first day she is feeling good. Except states she does feel sedated with clonidine. Reminded pt on rebound hypertension and ot skipping dose since rebound htn can occur.  No neurologic signs or symptoms. Feels good.   I recommended  hydralazine after discussing with Dr. Abner Greenspan.  Pt states she will not take any hydralazine. She states she does not want to add anything else. I will see how her bp is tomorrow. May advise if I think necessary and see if she might comply.     Pt also brings uw issues that will run out of insurance as she about to make job change but states she won't run out of insurance.

## 2020-03-04 ENCOUNTER — Ambulatory Visit (INDEPENDENT_AMBULATORY_CARE_PROVIDER_SITE_OTHER): Payer: Managed Care, Other (non HMO) | Admitting: Medical

## 2020-03-04 ENCOUNTER — Telehealth: Payer: Self-pay | Admitting: Medical

## 2020-03-04 ENCOUNTER — Ambulatory Visit: Payer: Managed Care, Other (non HMO) | Admitting: Internal Medicine

## 2020-03-04 ENCOUNTER — Other Ambulatory Visit: Payer: Self-pay

## 2020-03-04 ENCOUNTER — Encounter: Payer: Self-pay | Admitting: Medical

## 2020-03-04 ENCOUNTER — Encounter: Payer: Self-pay | Admitting: Internal Medicine

## 2020-03-04 VITALS — BP 120/88 | HR 75 | Temp 97.6°F | Resp 18 | Ht 67.0 in | Wt 150.0 lb

## 2020-03-04 VITALS — BP 140/100 | HR 113 | Ht 67.0 in | Wt 150.8 lb

## 2020-03-04 DIAGNOSIS — G43809 Other migraine, not intractable, without status migrainosus: Secondary | ICD-10-CM

## 2020-03-04 DIAGNOSIS — I252 Old myocardial infarction: Secondary | ICD-10-CM

## 2020-03-04 DIAGNOSIS — E785 Hyperlipidemia, unspecified: Secondary | ICD-10-CM

## 2020-03-04 DIAGNOSIS — I10 Essential (primary) hypertension: Secondary | ICD-10-CM

## 2020-03-04 DIAGNOSIS — R252 Cramp and spasm: Secondary | ICD-10-CM

## 2020-03-04 DIAGNOSIS — E0590 Thyrotoxicosis, unspecified without thyrotoxic crisis or storm: Secondary | ICD-10-CM

## 2020-03-04 DIAGNOSIS — I1A0 Resistant hypertension: Secondary | ICD-10-CM

## 2020-03-04 MED ORDER — METHIMAZOLE 10 MG PO TABS
10.0000 mg | ORAL_TABLET | Freq: Two times a day (BID) | ORAL | 1 refills | Status: DC
Start: 2020-03-04 — End: 2020-03-20

## 2020-03-04 MED ORDER — CLONIDINE HCL 0.1 MG PO TABS: 0.1000 mg | ORAL_TABLET | Freq: Two times a day (BID) | ORAL | 0 refills | Status: DC

## 2020-03-04 NOTE — Telephone Encounter (Signed)
Dr. Lafe Garin,  On 02-13-2020 pt tsh was less than .01 and t4 was 2.4. Dr. Lonzo Cloud endocrinologist is now in our office and pt had requested transfer to her I think for convenience. I thought that referral would happen quickly but has been delayed. Wanted to send this to you today. Sorry for the delay  She is seeing me today. Not sure when she was to follow up with you.  Wanting advise today on treatment changes pending follow up with you or Dr. Lonzo Cloud.  Thanks,  [name], PA-C

## 2020-03-04 NOTE — Progress Notes (Addendum)
Subjective:    Patient ID: Carla West, female    DOB: Apr 05, 1961, 59 y.o.   MRN: [id number]  HPI  Pt in for follow up from last one week. Various events have transpired. Since then her bp has remained high. At one point briefly over 200/100 range. In additon pt has history of migraine ha. She has been having is resistant htn along with intermittent potential neuroloigc signs and symptoms with ha.  I was able to get prior auth for pt CT on 02-27-2020.  IMPRESSION: Unremarkable non-contrast CT appearance of the brain. No evidence of acute intracranial abnormality.  Day of CT read was getting advise from neurologist and attempted to get pt in for neurologist appointment. Later saw in epic that neurologist noted no show rate of 26% and that since patients since symptoms subsided neurologist office  would give first available provider appointment rather that urgent appointment. Neurologist actually was very helpful and was attempting help patient and myself out after receiving the referral. Note regarding no show rate. Some of no shows may have been during her long haul covid recovery period when was extremely fatigued.  I also place referral to try to get her in with cardiology. Pt has hx of MI. Pt of Dr. Flora Lipps. Pt bp was controlled on last visit with cardilogist in march but had severely increased since then. She was advised to follow up in June with cardiologist(not sure if appointment was made at the time?). Pt bp recently very high. I informed cardiology she was on  amlodipoine, losartan, metoprolol, maxzide and now adding clonidine. Want assistance in htn management and was due for follow up with them. She was given a very quick appointment.  During the interim pt bp was still high despite ading cloinidione.such as 173/117 an 170/112   On day of 02-29-2020   I was informed. Pt states she has been in contact with you about her migraines.  She woke up this morning with BP at 167/102.  She  has been been vomiting w/ a migraine.  She wants to know if she can come in for a migraine cocktail or does she need to go to ER for it before the weekend gets here.  She stated she has a 1 pm appt with a cardiologist you set her up with.  [place] after receiving this message. I advised  she needs to call 911. EMS. That would be most direct route to approtiate ED and avoid long wait in lobby. She may need mri. Already had CT which was negative. Her bp is not coming down. I don't want her to have to wait hours to be seen in ED. She left ED the other day after waiting hours.   Dahlia Client called patient and she stated she doesn't want to ride in the EMS and she will go see cardiology today at 1 and see what he says.  At cardilogist office the decided to call EMS and take her to ED as I thought they would do.Overall assessment below. Note be at ED was 180/100.  1. Intractable headache: With frequent vomiting.  Symptom has been going on periodically for the past several weeks.  Patient has been seen in at least 2 emergency rooms.  Unfortunately, she did not stay for her last examination on 9/10 due to prolonged wait.  Potential differential diagnosis include acute CVA, severe migraine versus thyroid storm.  Possibility of acute MI is fairly low however not nonexistent.  We recommended EMS transport to  the local ED.  2. Weakness: Unclear cause, see #1 migraine with recommendation to be evaluated at local ED  3. CAD: Compliant with aspirin and Brilinta.  Last cardiac catheterization in September 2020, DES to left circumflex artery  4. Hypertension: Uncontrolled high blood pressure.  Will need a renal artery Doppler in the hospital.  Blood pressure has been increased twice recently without improvement.  Question if related to his intractable headache  5. Hyperlipidemia: Continue statin therapy  6. Hypothyroidism: Managed by endocrinology.  Pt was transferred to ED around 2:15. Then later saw she  requested IV be remove and left at 6:04.   On 03-01-2020 we corresponded via my chart. Please so those notes.One portiono of pt response was "YOUR SYSTEM  SUCKS and has failed me. [facility] system is broken  an honestly I'm not happy with your office"  On further discussion on today visit  she describes chaotic situation in ED. I asked for clarification if she was upset with me and she states she was not and would be upfront with me if she was.  I called pt during lunch on Monday.Arranged for her to follow up today.   I talked with Dr. Lonzo Cloud today. She does not think ordering metanephrines testing for htn  inidcated for [place]iss[place]. Though she did think 10-20% potential elevation of bp due to high thyroid function. Pt bp this am with Dr. Lonzo Cloud was 140/100. Pt tells me she was just taking methimazole just once day. Now told to increase to twice day per endocrinologist.   Pt has been on clonidine for one week.  Pt states last ha she had was this weekend.  In am feels mild nausea. But fleeting. She has zofran.   Pt reiterates she will change [facility] and [facility] for speciality care and ED.  Long talk with pt today about recent ED visit and circumstances.       Review of Systems  Constitutional: Negative for chills and fatigue.       Sedate feel with clonidine.  HENT: Negative for congestion and drooling.   Eyes: Negative for visual disturbance.  Respiratory: Negative for cough, chest tightness, shortness of breath and wheezing.   Cardiovascular: Negative for chest pain.  Gastrointestinal: Negative for abdominal pain.  Musculoskeletal: Negative for back pain, gait problem, myalgias and neck stiffness.  Skin: Negative for rash.  Neurological: Negative for dizziness, speech difficulty, weakness, numbness and headaches.  Hematological: Negative for adenopathy. Does not bruise/bleed easily.  Psychiatric/Behavioral: Negative for behavioral problems and self-injury. The patient is not  nervous/anxious.        Expressed extreme frustration recently with ED process, [facility] system and upset at our office as well per my chart message she sent me     Past Medical History:  Diagnosis Date  . Abnormal Pap smear of cervix 08/03/2015  . Benign essential HTN 08/03/2015  . Graves disease   . History of chicken pox   . Migraine headache 08/03/2015     Social History   Socioeconomic History  . Marital status: Divorced    Spouse name: Not on file  . Number of children: Not on file  . Years of education: Not on file  . Highest education level: Not on file  Occupational History  . Not on file  Tobacco Use  . Smoking status: Never Smoker  . Smokeless tobacco: Never Used  Vaping Use  . Vaping Use: Never used  Substance and Sexual Activity  . Alcohol use: No  . Drug  use: No  . 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 Not on file  Social History Narrative  . Not on file   Social Determinants of Health   Financial Resource Strain:   . Difficulty of Paying Living Expenses: Not on file  Food Insecurity:   . Worried About Programme researcher, broadcasting/film/videounning Out of Food in the Last Year: Not on file  . Ran Out of Food in the Last Year: Not on file  Transportation Needs:   . Lack of Transportation (Medical): Not on file  . Lack of Transportation (Non-Medical): Not on file  Physical Activity:   . Days of Exercise per Week: Not on file  . Minutes of Exercise per Session: Not on file  Stress:   . Feeling of Stress : Not on file  Social Connections:   . Frequency of Communication with Friends and Family: Not on file  . Frequency of Social Gatherings with Friends and Family: Not on file  . Attends Religious Services: Not on file  . Active Member of Clubs or Organizations: Not on file  . Attends BankerClub or Organization Meetings: Not on file  . Marital Status: Not on file  Intimate Partner Violence:   .  Fear of Current or Ex-Partner: Not on file  . Emotionally Abused: Not on file  . Physically Abused: Not on file  . Sexually Abused: Not on file    Past Surgical History:  Procedure Laterality Date  . CORONARY STENT INTERVENTION N/A 02/27/2019   Procedure: CORONARY STENT INTERVENTION;  Surgeon: Kathleene HazelMcAlhany, Christopher D, MD;  Location: [facility];  Service: Cardiovascular;  Laterality: N/A;  . LEFT HEART CATH AND CORONARY ANGIOGRAPHY N/A 02/27/2019   Procedure: LEFT HEART CATH AND CORONARY ANGIOGRAPHY;  Surgeon: Kathleene HazelMcAlhany, Christopher D, MD;  Location: [facility] INVASIVE CV LAB;  Service: Cardiovascular;  Laterality: N/A;  . TUBAL LIGATION      Family History  Problem Relation Age of Onset  . Hypertension Mother   . Heart disease Mother   . Hyperlipidemia Father   . Breast cancer Maternal Grandmother   . Hyperlipidemia Sister   . Other Brother        plan crash in [employer]  . Migraines Son   . Heart disease Brother        CHF, arrythmia  . Hypertension Brother   . Gout Brother   . Hypertension Brother     Allergies  Allergen Reactions  . Elavil [Amitriptyline Hcl] Other (See Comments)    Jittery..the patient states she has not had any side effects     Current Outpatient Medications on File Prior to Visit  Medication Sig Dispense Refill  . Acetaminophen-Codeine (TYLENOL/CODEINE #3) 300-30 MG tablet Take 1 tablet by mouth every 6 (six) hours as needed for pain. 8 tablet 0  . almotriptan (AXERT) 12.5 MG tablet Take 1 tablet by mouth twice daily as needed for migraine. May repeat in 2 hours if needed 20 tablet 0  . amLODipine (NORVASC) 10 MG tablet Take 1 tablet (10 mg total) by mouth daily. 90 tablet 1  . aspirin EC 81 MG tablet Take 1 tablet (81 mg total) by mouth daily. 90 tablet 3  . atorvastatin (LIPITOR) 80 MG tablet Take 1 tablet (80 mg total) by mouth daily at 6 PM. 90 tablet 3  . cloNIDine (CATAPRES) 0.1 MG tablet Take 1 tablet (0.1 mg total) by mouth 2 (two)  times  daily. 60 tablet 0  . dicyclomine (BENTYL) 20 MG tablet Take 1 tablet (20 mg total) by mouth 4 (four) times daily -  before meals and at bedtime. 120 tablet 3  . hydrOXYzine (ATARAX/VISTARIL) 25 MG tablet 1 tab po q hs prn insomnia 90 tablet 1  . losartan (COZAAR) 50 MG tablet Take 1 tablet (50 mg total) by mouth daily. 90 tablet 0  . methimazole (TAPAZOLE) 10 MG tablet Take 10 mg in a.m. and 10 mg in the evening, with meals 90 tablet 2  . metoprolol succinate (TOPROL-XL) 100 MG 24 hr tablet Take 1 tablet (100 mg total) by mouth daily. Take with or immediately following a meal. 90 tablet 3  . ondansetron (ZOFRAN ODT) 8 MG disintegrating tablet Take 1 tablet (8 mg total) by mouth every 8 (eight) hours as needed for nausea or vomiting. 20 tablet 0  . promethazine (PHENERGAN) 25 MG tablet Take 1 tablet (25 mg total) by mouth every 6 (six) hours as needed for nausea or vomiting. 30 tablet 0  . sodium chloride (OCEAN) 0.65 % SOLN nasal spray Place 1 spray into both nostrils 2 (two) times daily as needed for congestion. 30 mL 6  . ticagrelor (BRILINTA) 90 MG TABS tablet Take 1 tablet (90 mg total) by mouth 2 (two) times daily. 180 tablet 3  . tiZANidine (ZANAFLEX) 4 MG tablet Take 1 tablet (4 mg total) by mouth at bedtime as needed. 5 tablet 0  . triamterene-hydrochlorothiazide (MAXZIDE-25) 37.5-25 MG tablet Take 1 tablet by mouth daily. 90 tablet 0   No current facility-administered medications on file prior to visit.    There were no vitals taken for this visit.      Objective:   Physical Exam   General Mental Status- Alert. General Appearance- Not in acute distress.   Skin General: Color- Normal Color. Moisture- Normal Moisture.  Neck Carotid Arteries- Normal color. Moisture- Normal Moisture. No carotid bruits. No JVD.  Chest and Lung Exam Auscultation: Breath Sounds:-Normal.  Cardiovascular Auscultation:Rythm- Regular. Murmurs & Other Heart Sounds:Auscultation of the heart  reveals- No Murmurs.  Abdomen Inspection:-Inspeection Normal. Palpation/Percussion:Note:No mass. Palpation and Percussion of the abdomen reveal- Non Tender, Non Distended + BS, no rebound or guarding.   Neurologic Cranial Nerve exam:- CN III-XII intact(No nystagmus), symmetric smile. Drift Test:- No drift. Romberg Exam:- Negative.  Heal to Toe Gait exam:-Normal. Finger to Nose:- Normal/Intact Strength:- 5/5 equal and symmetric strength both upper and lower extremities.   Assessment & Plan:  Your blood pressure is much better today than recent blood pressure readings.  In fact  remarkable improvement compared to prior readings.  Double checked to verify accurate reading.  I do want you to continue current regimen.  I sent in a refill of clonidine since you are report only getting  6 tablets.  On the prescription sent over  was actually for 60 tablets.  Please make sure that you take the tablet twice a day at frequency of every 12 hours.  If you delay taking medication there is potential for rebound hypertension.  Sedation is side effect but sometimes sedation will wear off the longer you take the medication.  You reports some cramping of your hands.  We will need to check metabolic panel.  I will you to check blood pressure a couple times a day and check if you have headache you feel weak/dizzy.  Want to make sure that your blood pressure is not dropping more than present level.  I  did go ahead and place an order for a renal artery ultrasound.  Hopefully that will be scheduled.  I did go ahead and place 2 new referrals both to cardiology and neurology.  Asking for [facility] location per your request.  Hopefully you will avoid any severe migraine headache or extreme blood pressure elevation.  If you do have emergent type signs and symptoms along with severe elevated blood pressure or severe migraine headache then you have indicated you may try [facility] for [place] ED.  Also we  discussed [facility] and urgent care as options.  Follow-up in 3 weeks or as needed.  [name], PA-C   Time spent with patient today was 40+  minutes which consisted of  Extensive chart review, discussing diagnoses, work up, placing referral,  Treatment ,counseling pt today, answering questions and documentation.

## 2020-03-04 NOTE — Telephone Encounter (Signed)
Carla West, I see she has an appt with Dr. Lonzo Cloud today (this am)... Carla West

## 2020-03-04 NOTE — Patient Instructions (Addendum)
Your blood pressure is much better today than recent blood pressure readings.  In fact  remarkable improvement compared to prior readings.  Double checked to verify accurate reading.  I do want you to continue current regimen.  I sent in a refill of clonidine since you are report only getting  6 tablets.  On the prescription sent over  was actually for 60 tablets.  Please make sure that you take the tablet twice a day at frequency of every 12 hours.  If you delay taking medication there is potential for rebound hypertension.  Sedation is side effect but sometimes sedation will wear off the longer you take the medication.  You reports some cramping of your hands.  We will need to check metabolic panel.  I will you to check blood pressure a couple times a day and check if you have headache you feel weak/dizzy.  Want to make sure that your blood pressure is not dropping more than present level.  I did go ahead and place an order for a renal artery ultrasound.  Hopefully that will be scheduled.  I did go ahead and place 2 new referrals both to cardiology and neurology.  Asking for [facility] location per your request.  Hopefully you will avoid any severe migraine headache or extreme blood pressure elevation.  If you do have emergent type signs and symptoms along with severe elevated blood pressure or severe migraine headache then you have indicated you may try [facility] for [place] ED.  Also we discussed [facility] and urgent care as options.  Follow-up in 3 weeks or as needed.

## 2020-03-04 NOTE — Progress Notes (Signed)
Name: Carla West  MRN/ DOB: [id number], November 18, 1960    Age/ Sex: 59 y.o., female    PCP: Marisue Brooklyn   Reason for Endocrinology Evaluation: Hyperthyroidism     Date of Initial Endocrinology Evaluation: 03/04/2020     HPI: Ms. Lynnett Langlinais is a 59 y.o. female with a past medical history of HTN, migraine headaches and Graves' disease. The patient presented for initial endocrinology clinic visit on 03/04/2020 for consultative assistance with her hyperthyroidism.   Pt has been noted with hyperthyroidism since 2019.  She has been on methimazole since 2020.   Currently on Methimazole 10 mg, 1 tablet a day.   Weight has been steady   Denies no local necks symptoms Occasional diarrhea and has migraine headaches  She has subjective fever with migraine headaches  Has vomiting with migraine headaches as well   No FH of thyroid disease      HISTORY:  Past Medical History:  Past Medical History:  Diagnosis Date  . Abnormal Pap smear of cervix 08/03/2015  . Benign essential HTN 08/03/2015  . Graves disease   . History of chicken pox   . Migraine headache 08/03/2015   Past Surgical History:  Past Surgical History:  Procedure Laterality Date  . CORONARY STENT INTERVENTION N/A 02/27/2019   Procedure: CORONARY STENT INTERVENTION;  Surgeon: Kathleene Hazel, MD;  Location: [facility];  Service: Cardiovascular;  Laterality: N/A;  . LEFT HEART CATH AND CORONARY ANGIOGRAPHY N/A 02/27/2019   Procedure: LEFT HEART CATH AND CORONARY ANGIOGRAPHY;  Surgeon: Kathleene Hazel, MD;  Location: [facility];  Service: Cardiovascular;  Laterality: N/A;  . TUBAL LIGATION        Social History:  reports that she has never smoked. She has never used smokeless tobacco. She reports that she does not drink alcohol and does not use drugs.  Family History: family history includes Breast cancer in her maternal grandmother; Gout in her brother; Heart disease in  her brother and mother; Hyperlipidemia in her father and sister; Hypertension in her brother, brother, and mother; Migraines in her son; Other in her brother.   HOME MEDICATIONS: Allergies as of 03/04/2020      Reactions   Elavil [amitriptyline Hcl] Other (See Comments)   Jittery..the patient states she has not had any side effects       Medication List       Accurate as of March 04, 2020  9:08 AM. If you have any questions, ask your nurse or doctor.        Acetaminophen-Codeine 300-30 MG tablet Commonly known as: TYLENOL/CODEINE #3 Take 1 tablet by mouth every 6 (six) hours as needed for pain.   almotriptan 12.5 MG tablet Commonly known as: AXERT Take 1 tablet by mouth twice daily as needed for migraine. May repeat in 2 hours if needed   amLODipine 10 MG tablet Commonly known as: NORVASC Take 1 tablet (10 mg total) by mouth daily.   aspirin EC 81 MG tablet Take 1 tablet (81 mg total) by mouth daily.   atorvastatin 80 MG tablet Commonly known as: LIPITOR Take 1 tablet (80 mg total) by mouth daily at 6 PM.   cloNIDine 0.1 MG tablet Commonly known as: CATAPRES Take 1 tablet (0.1 mg total) by mouth 2 (two) times daily.   dicyclomine 20 MG tablet Commonly known as: BENTYL Take 1 tablet (20 mg total) by mouth 4 (four) times daily -  before meals and at bedtime.  hydrOXYzine 25 MG tablet Commonly known as: ATARAX/VISTARIL 1 tab po q hs prn insomnia   losartan 50 MG tablet Commonly known as: COZAAR Take 1 tablet (50 mg total) by mouth daily.   methimazole 10 MG tablet Commonly known as: TAPAZOLE Take 1 tablet (10 mg total) by mouth 2 (two) times daily. Take 10 mg in a.m. and 10 mg in the evening, with meals What changed:   how much to take  how to take this  when to take this Changed by: Scarlette Shorts, MD   metoprolol succinate 100 MG 24 hr tablet Commonly known as: TOPROL-XL Take 1 tablet (100 mg total) by mouth daily. Take with or immediately  following a meal.   ondansetron 8 MG disintegrating tablet Commonly known as: Zofran ODT Take 1 tablet (8 mg total) by mouth every 8 (eight) hours as needed for nausea or vomiting.   promethazine 25 MG tablet Commonly known as: PHENERGAN Take 1 tablet (25 mg total) by mouth every 6 (six) hours as needed for nausea or vomiting.   sodium chloride 0.65 % Soln nasal spray Commonly known as: OCEAN Place 1 spray into both nostrils 2 (two) times daily as needed for congestion.   ticagrelor 90 MG Tabs tablet Commonly known as: BRILINTA Take 1 tablet (90 mg total) by mouth 2 (two) times daily.   tiZANidine 4 MG tablet Commonly known as: Zanaflex Take 1 tablet (4 mg total) by mouth at bedtime as needed.   triamterene-hydrochlorothiazide 37.5-25 MG tablet Commonly known as: MAXZIDE-25 Take 1 tablet by mouth daily.         REVIEW OF SYSTEMS: A comprehensive ROS was conducted with the patient and is negative except as per HPI     OBJECTIVE:  VS: BP (!) 140/100 (BP Location: Left Arm, Patient Position: Sitting, Cuff Size: Normal)   Pulse (!) 113   Ht 5\' 7"  (1.702 m)   Wt 150 lb 12.8 oz (68.4 kg)   SpO2 95%   BMI 23.62 kg/m    Wt Readings from Last 3 Encounters:  03/04/20 150 lb 12.8 oz (68.4 kg)  02/29/20 151 lb 12.8 oz (68.9 kg)  02/26/20 155 lb 6.4 oz (70.5 kg)     EXAM: General: Pt appears well and is in NAD  Neck: General: Supple without adenopathy. Thyroid: Thyroid size normal.  No goiter or nodules appreciated. No thyroid bruit.  Lungs: Clear with good BS bilat with no rales, rhonchi, or wheezes  Heart: Auscultation: RRR.  Abdomen: Normoactive bowel sounds, soft, nontender, without masses or organomegaly palpable  Extremities:  BL LE: No pretibial edema normal ROM and strength.  Skin: Hair: Texture and amount normal with gender appropriate distribution Skin Inspection: No rashes Skin Palpation: Skin temperature, texture, and thickness normal to palpation  Neuro:  Cranial nerves: II - XII grossly intact  Motor: Normal strength throughout DTRs: 2+ and symmetric in UE without delay in relaxation phase  Mental Status: Judgment, insight: Intact Orientation: Oriented to time, place, and person Mood and affect: No depression, anxiety, or agitation     DATA REVIEWED: Results for SATINE, HAUSNER (MRN Unknown Foley) as of 03/04/2020 09:15  Ref. Range 02/29/2020 16:03  TCO2 Latest Ref Range: 22 - 32 mmol/L 24  Sodium Latest Ref Range: 135 - 145 mmol/L 141  Potassium Latest Ref Range: 3.5 - 5.1 mmol/L 3.8  Chloride Latest Ref Range: 98 - 111 mmol/L 108  Glucose Latest Ref Range: 70 - 99 mg/dL 91  BUN Latest Ref Range: 6 -  20 mg/dL 11  Creatinine Latest Ref Range: 0.44 - 1.00 mg/dL 5.39  Calcium Ionized Latest Ref Range: 1.15 - 1.40 mmol/L 1.12 (L)  Hemoglobin Latest Ref Range: 12.0 - 15.0 g/dL 76.7  HCT Latest Ref Range: 36 - 46 % 37.0    Results for JOANNA, BORAWSKI (MRN [id number]) as of 03/04/2020 09:15  Ref. Range 02/29/2020 15:18  Neutrophils Latest Units: % 86  Lymphocytes Latest Units: % 10  Monocytes Relative Latest Units: % 4  Eosinophil Latest Units: % 0  Basophil Latest Units: % 0  NEUT# Latest Ref Range: 1.7 - 7.7 K/uL 4.7  Lymphocyte # Latest Ref Range: 0.7 - 4.0 K/uL 0.6 (L)  Monocyte # Latest Ref Range: 0 - 1 K/uL 0.2  Eosinophils Absolute Latest Ref Range: 0 - 0 K/uL 0.0  Basophils Absolute Latest Ref Range: 0 - 0 K/uL 0.0  Abs Immature Granulocytes Latest Ref Range: 0.00 - 0.07 K/uL 0.00   Results for ALIAYAH, TYER (MRN [id number]) as of 03/04/2020 09:15  Ref. Range 02/13/2020 16:27  TSH Latest Ref Range: 0.40 - 4.50 mIU/L <0.01 (L)  T4,Free(Direct) Latest Ref Range: 0.8 - 1.8 ng/dL 2.4 (H)   ASSESSMENT/PLAN/RECOMMENDATIONS:   1. Hyperthyroidism:   - We discussed D/D of graves' disease vs toxic thyroid nodule(s)  - At this time she is biochemically hyperthyroid and will increase methimazole as below - Once her thyroid is under  control, will proceed with thyroid uptake and scan if TRAB is unrevealing   Medications : Increase Methimazole 10 mg to 1 tab BID    F/U in 3 months Labs in 6 weeks   Signed electronically by: Lyndle Herrlich, MD  [facility]  [facility] [address] [place] [place], [place] [address] Phone: [phone] FAX: [phone]   CC: Marisue Brooklyn [address] [place] [address] Phone: [phone] Fax: [phone]   Return to Endocrinology clinic as below: Future Appointments  Date Time Provider Department Center  03/04/2020  3:20 PM Saguier, Kateri Mc [facility]

## 2020-03-04 NOTE — Patient Instructions (Signed)
-   INcrease methimazole 10 mg, 1 tablet TWICE daily

## 2020-03-04 NOTE — Telephone Encounter (Signed)
Was not aware.  Thanks for update and quick response.  Riva Sesma.

## 2020-03-06 ENCOUNTER — Telehealth: Payer: Self-pay | Admitting: Medical

## 2020-03-06 DIAGNOSIS — R252 Cramp and spasm: Secondary | ICD-10-CM

## 2020-03-06 NOTE — Telephone Encounter (Signed)
She was advised and I explained importance since she had cramps in hands. If she has low potassium needs to be corrected. Severe low levels can require me to potentially advise ED evaluation. So please make sure she gets scheduled fomorrow.  Most of time potassium does not drop severe but can. We need to check soon/tomrorow since she did nt follow thru the other day.

## 2020-03-06 NOTE — Addendum Note (Signed)
Addended by: Gwenevere Abbot on: 03/06/2020 03:59 PM   Modules accepted: Orders

## 2020-03-06 NOTE — Telephone Encounter (Signed)
Labs weren't done

## 2020-03-06 NOTE — Telephone Encounter (Signed)
I put in cmp the other day and explained to pt get done on day of visit. Explained importance. Did she get that done? Check with lab please if not then she needs to be schedule tomorrow to get the lab.

## 2020-03-08 ENCOUNTER — Telehealth: Payer: Self-pay | Admitting: Medical

## 2020-03-08 NOTE — Telephone Encounter (Signed)
On day of pt visit with Dr. Lonzo Cloud again talked with Dr. Abner Greenspan. We made decision to give patient more time and not dismiss presently. Hoping bp will stay improved and she will be compliant in regards to overall advise that we give in addition to staying in ED if emergent conditions occur.  On last visit with me bp was well controlled.  That day I explained importance of getting cmp. Explained why wanted her to have K/cmp checked. Later double checked and she did not get the lab?

## 2020-03-11 ENCOUNTER — Other Ambulatory Visit: Payer: Managed Care, Other (non HMO)

## 2020-03-11 NOTE — Addendum Note (Signed)
Addended by: Mervin Kung A on: 03/11/2020 07:19 AM   Modules accepted: Orders

## 2020-03-12 ENCOUNTER — Other Ambulatory Visit: Payer: Self-pay

## 2020-03-12 ENCOUNTER — Other Ambulatory Visit (INDEPENDENT_AMBULATORY_CARE_PROVIDER_SITE_OTHER): Payer: Managed Care, Other (non HMO)

## 2020-03-12 ENCOUNTER — Ambulatory Visit (HOSPITAL_COMMUNITY)
Admission: RE | Admit: 2020-03-12 | Discharge: 2020-03-12 | Disposition: A | Discharge status: Home / Self Care | Payer: Managed Care, Other (non HMO) | Source: Ambulatory Visit | Attending: Medical | Admitting: Medical

## 2020-03-12 DIAGNOSIS — I10 Essential (primary) hypertension: Secondary | ICD-10-CM | POA: Diagnosis present

## 2020-03-12 DIAGNOSIS — E0590 Thyrotoxicosis, unspecified without thyrotoxic crisis or storm: Secondary | ICD-10-CM | POA: Diagnosis not present

## 2020-03-12 NOTE — Progress Notes (Signed)
Renal artery duplex completed. Refer to "CV Proc" under chart review to view preliminary results.  03/12/2020 9:00 AM Eula Fried., MHA, RVT, RDCS, RDMS

## 2020-03-12 NOTE — Addendum Note (Signed)
Addended by: Mervin Kung A on: 03/12/2020 02:04 PM   Modules accepted: Orders

## 2020-03-15 LAB — COMPREHENSIVE METABOLIC PANEL
AG Ratio: 1.5 (calc) (ref 1.0–2.5)
ALT: 16 U/L (ref 6–29)
AST: 13 U/L (ref 10–35)
Albumin: 4 g/dL (ref 3.6–5.1)
Alkaline phosphatase (APISO): 103 U/L (ref 37–153)
BUN/Creatinine Ratio: 29 (calc) — ABNORMAL HIGH (ref 6–22)
BUN: 28 mg/dL — ABNORMAL HIGH (ref 7–25)
CO2: 24 mmol/L (ref 20–32)
Calcium: 9.3 mg/dL (ref 8.6–10.4)
Chloride: 104 mmol/L (ref 98–110)
Creat: 0.98 mg/dL (ref 0.50–1.05)
Globulin: 2.7 g/dL (calc) (ref 1.9–3.7)
Glucose, Bld: 134 mg/dL — ABNORMAL HIGH (ref 65–99)
Potassium: 4.2 mmol/L (ref 3.5–5.3)
Sodium: 140 mmol/L (ref 135–146)
Total Bilirubin: 0.3 mg/dL (ref 0.2–1.2)
Total Protein: 6.7 g/dL (ref 6.1–8.1)

## 2020-03-15 LAB — CBC WITH DIFFERENTIAL/PLATELET
Absolute Monocytes: 590 cells/uL (ref 200–950)
Basophils Absolute: 20 cells/uL (ref 0–200)
Basophils Relative: 0.4 %
Eosinophils Absolute: 60 cells/uL (ref 15–500)
Eosinophils Relative: 1.2 %
HCT: 37.9 % (ref 35.0–45.0)
Hemoglobin: 12.4 g/dL (ref 11.7–15.5)
Lymphs Abs: 1435 cells/uL (ref 850–3900)
MCH: 27 pg (ref 27.0–33.0)
MCHC: 32.7 g/dL (ref 32.0–36.0)
MCV: 82.6 fL (ref 80.0–100.0)
MPV: 9.5 fL (ref 7.5–12.5)
Monocytes Relative: 11.8 %
Neutro Abs: 2895 cells/uL (ref 1500–7800)
Neutrophils Relative %: 57.9 %
Platelets: 304 10*3/uL (ref 140–400)
RBC: 4.59 10*6/uL (ref 3.80–5.10)
RDW: 12.2 % (ref 11.0–15.0)
Total Lymphocyte: 28.7 %
WBC: 5 10*3/uL (ref 3.8–10.8)

## 2020-03-15 LAB — TSH: TSH: <0.01 mIU/L — ABNORMAL LOW (ref 0.40–4.50)

## 2020-03-15 LAB — TRAB (TSH RECEPTOR BINDING ANTIBODY): TRAB: 7.86 IU/L — ABNORMAL HIGH (ref <=2.00)

## 2020-03-15 LAB — T4, FREE: Free T4: 2.1 ng/dL — ABNORMAL HIGH (ref 0.8–1.8)

## 2020-03-20 ENCOUNTER — Telehealth: Payer: Self-pay | Admitting: Medical

## 2020-03-20 DIAGNOSIS — I10 Essential (primary) hypertension: Secondary | ICD-10-CM

## 2020-03-20 MED ORDER — DICYCLOMINE HCL 20 MG PO TABS
20.0000 mg | ORAL_TABLET | Freq: Three times a day (TID) | ORAL | 3 refills | Status: DC
Start: 2020-03-20 — End: 2020-10-31

## 2020-03-20 MED ORDER — METOPROLOL SUCCINATE ER 100 MG PO TB24: 100.0000 mg | ORAL_TABLET | Freq: Every day | ORAL | 3 refills | Status: DC

## 2020-03-20 MED ORDER — ONDANSETRON 8 MG PO TBDP: 8.0000 mg | ORAL_TABLET | Freq: Three times a day (TID) | ORAL | 0 refills | Status: DC | PRN

## 2020-03-20 MED ORDER — METHIMAZOLE 10 MG PO TABS
10.0000 mg | ORAL_TABLET | Freq: Two times a day (BID) | ORAL | 1 refills | Status: DC
Start: 2020-03-20 — End: 2020-10-31

## 2020-03-20 MED ORDER — LOSARTAN POTASSIUM 50 MG PO TABS: 50.0000 mg | ORAL_TABLET | Freq: Every day | ORAL | 0 refills | Status: DC

## 2020-03-20 MED ORDER — TIZANIDINE HCL 4 MG PO TABS: 4.0000 mg | ORAL_TABLET | Freq: Every evening | ORAL | 0 refills | Status: AC | PRN

## 2020-03-20 MED ORDER — TRIAMTERENE-HCTZ 37.5-25 MG PO TABS: 1.0000 | ORAL_TABLET | Freq: Every day | ORAL | 0 refills | Status: DC

## 2020-03-20 MED ORDER — ALMOTRIPTAN MALATE 12.5 MG PO TABS: ORAL_TABLET | ORAL | 1 refills | Status: DC

## 2020-03-20 MED ORDER — AMLODIPINE BESYLATE 10 MG PO TABS: 10.0000 mg | ORAL_TABLET | Freq: Every day | ORAL | 1 refills | Status: DC

## 2020-03-20 MED ORDER — HYDROXYZINE HCL 25 MG PO TABS: ORAL_TABLET | ORAL | 1 refills | Status: DC

## 2020-03-20 MED ORDER — TICAGRELOR 90 MG PO TABS: 90.0000 mg | ORAL_TABLET | Freq: Two times a day (BID) | ORAL | 0 refills | Status: DC

## 2020-03-20 MED ORDER — ACETAMINOPHEN-CODEINE 300-30 MG PO TABS
1.0000 | ORAL_TABLET | Freq: Four times a day (QID) | ORAL | 0 refills | Status: DC | PRN
Start: 2020-03-20 — End: 2020-11-04

## 2020-03-20 MED ORDER — CLONIDINE HCL 0.1 MG PO TABS: 0.1000 mg | ORAL_TABLET | Freq: Two times a day (BID) | ORAL | 0 refills | Status: DC

## 2020-03-20 MED ORDER — ATORVASTATIN CALCIUM 80 MG PO TABS
80.0000 mg | ORAL_TABLET | Freq: Every day | ORAL | 3 refills | Status: DC
Start: 2020-03-20 — End: 2020-10-31

## 2020-03-20 NOTE — Telephone Encounter (Signed)
Sent in all meds except the controlled .  axert and tylenol

## 2020-03-20 NOTE — Telephone Encounter (Signed)
Opened to send rx to pt pharmacy.

## 2020-03-20 NOTE — Telephone Encounter (Signed)
Patient states she needs all her medication to be sent to [facility]. She wants a 90 days supply of all of them. Patient states this needs to be done today since her insurance will end tomorrow.  Acetaminophen-Codeine (TYLENOL/CODEINE #3) 300-30 MG tablet [[id number]]   almotriptan (AXERT) 12.5 MG tablet [[id number]]   amLODipine (NORVASC) 10 MG tablet [[id number]]   atorvastatin (LIPITOR) 80 MG tablet [[id number]]  cloNIDine (CATAPRES) 0.1 MG tablet [[id number]]   dicyclomine (BENTYL) 20 MG tablet [[id number]]   hydrOXYzine (ATARAX/VISTARIL) 25 MG tablet [[id number]]   losartan (COZAAR) 50 MG tablet [[id number]]   methimazole (TAPAZOLE) 10 MG tablet [[id number]]   metoprolol succinate (TOPROL-XL) 100 MG 24 hr tablet [[id number]]   ondansetron (ZOFRAN ODT) 8 MG disintegrating tablet [[id number]]   ticagrelor (BRILINTA) 90 MG TABS tablet [[id number]]   tiZANidine (ZANAFLEX) 4 MG tablet [[id number]]   triamterene-hydrochlorothiazide (MAXZIDE-25) 37.5-25 MG tablet [[id number]]   [id number] [place], [place] - [address] AT [facility] [address] [facility] [address] & [facility] [address]  [address], [place] [address]  Phone:  [phone] Fax:  [phone]

## 2020-03-20 NOTE — Telephone Encounter (Signed)
Rx brillanta sent to pt pharmacy. Cardiologist prescribed. I filled since pt insurance expiring today. Filled per pt request.

## 2020-03-20 NOTE — Telephone Encounter (Signed)
Filled axert. 90 tab rx of this may be limited per insurance. Only rx of tylenol with codeine 8 tabs as this is not medication that I want her on long term. Only wrote this when HA were severe and bp was extreme.

## 2020-03-20 NOTE — Telephone Encounter (Signed)
Rx axert and limit tylenol with codeine rx sent to pt pharmacy.

## 2020-03-28 ENCOUNTER — Other Ambulatory Visit: Payer: Self-pay | Admitting: Medical

## 2020-04-16 ENCOUNTER — Other Ambulatory Visit: Payer: Managed Care, Other (non HMO)

## 2020-04-27 ENCOUNTER — Telehealth: Payer: Self-pay | Admitting: Medical

## 2020-04-27 NOTE — Telephone Encounter (Signed)
Opened to review 

## 2020-05-02 ENCOUNTER — Telehealth: Payer: Self-pay | Admitting: *Deleted

## 2020-05-02 ENCOUNTER — Ambulatory Visit: Payer: Managed Care, Other (non HMO) | Admitting: Neurology

## 2020-05-02 NOTE — Telephone Encounter (Signed)
Patient was no show for new patient appointment today. 

## 2020-05-13 IMAGING — DX DG LUMBAR SPINE 2-3V
3 series · 3 of 3 positions shown · non-contrast
Comparison: None.

CLINICAL DATA: Low back pain with radicular features to right leg.

EXAM:
LUMBAR SPINE - 2-3 VIEW

[l-spine ap]
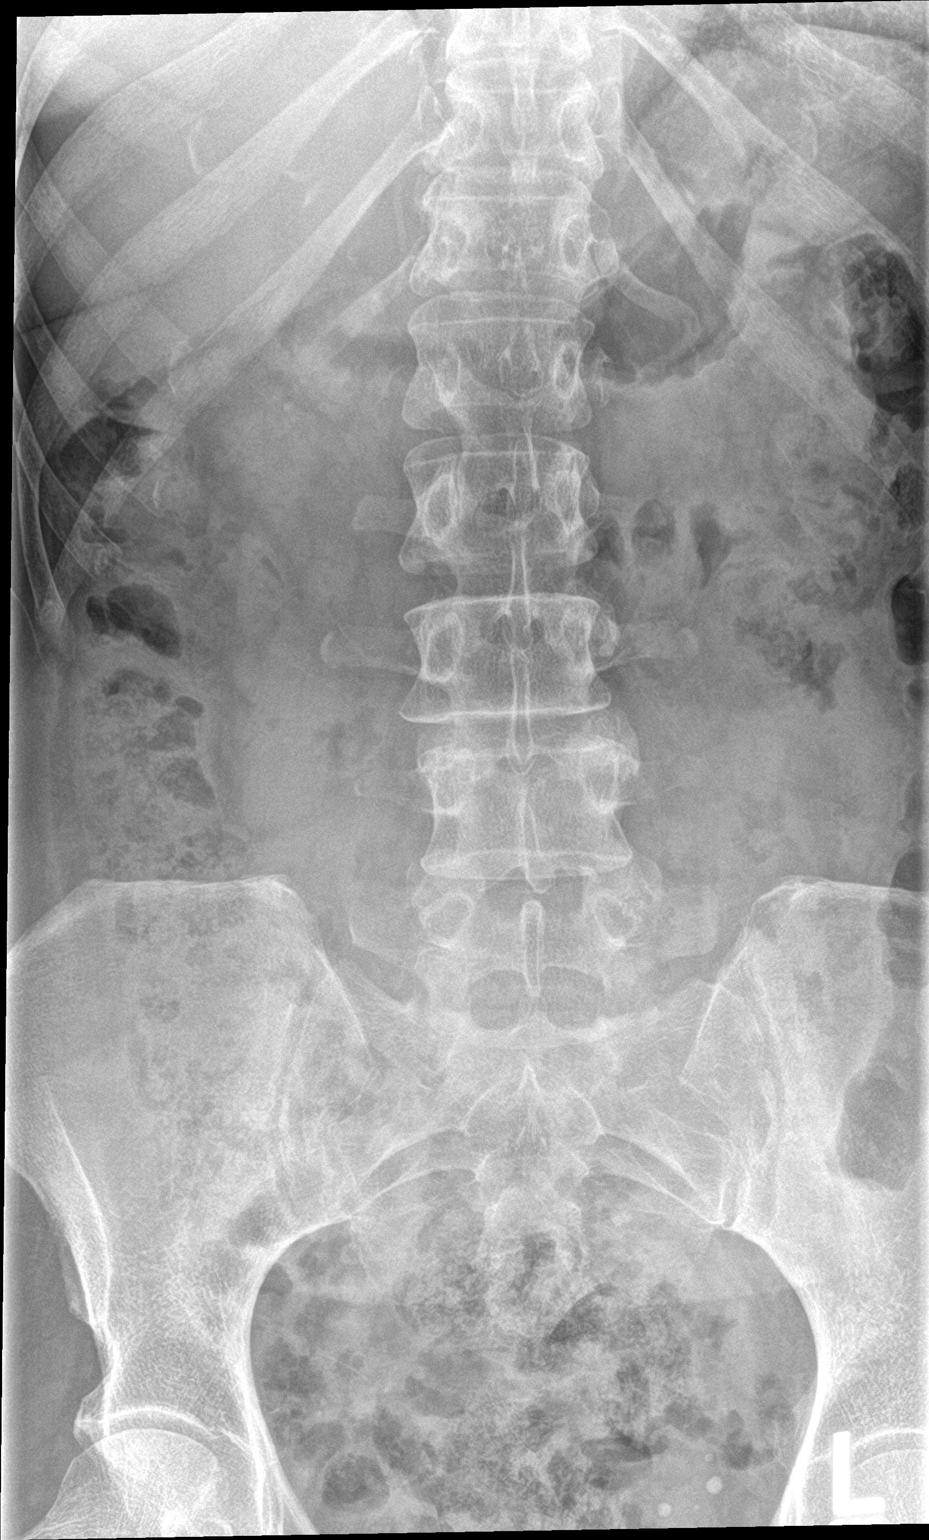

[l-spine lat]
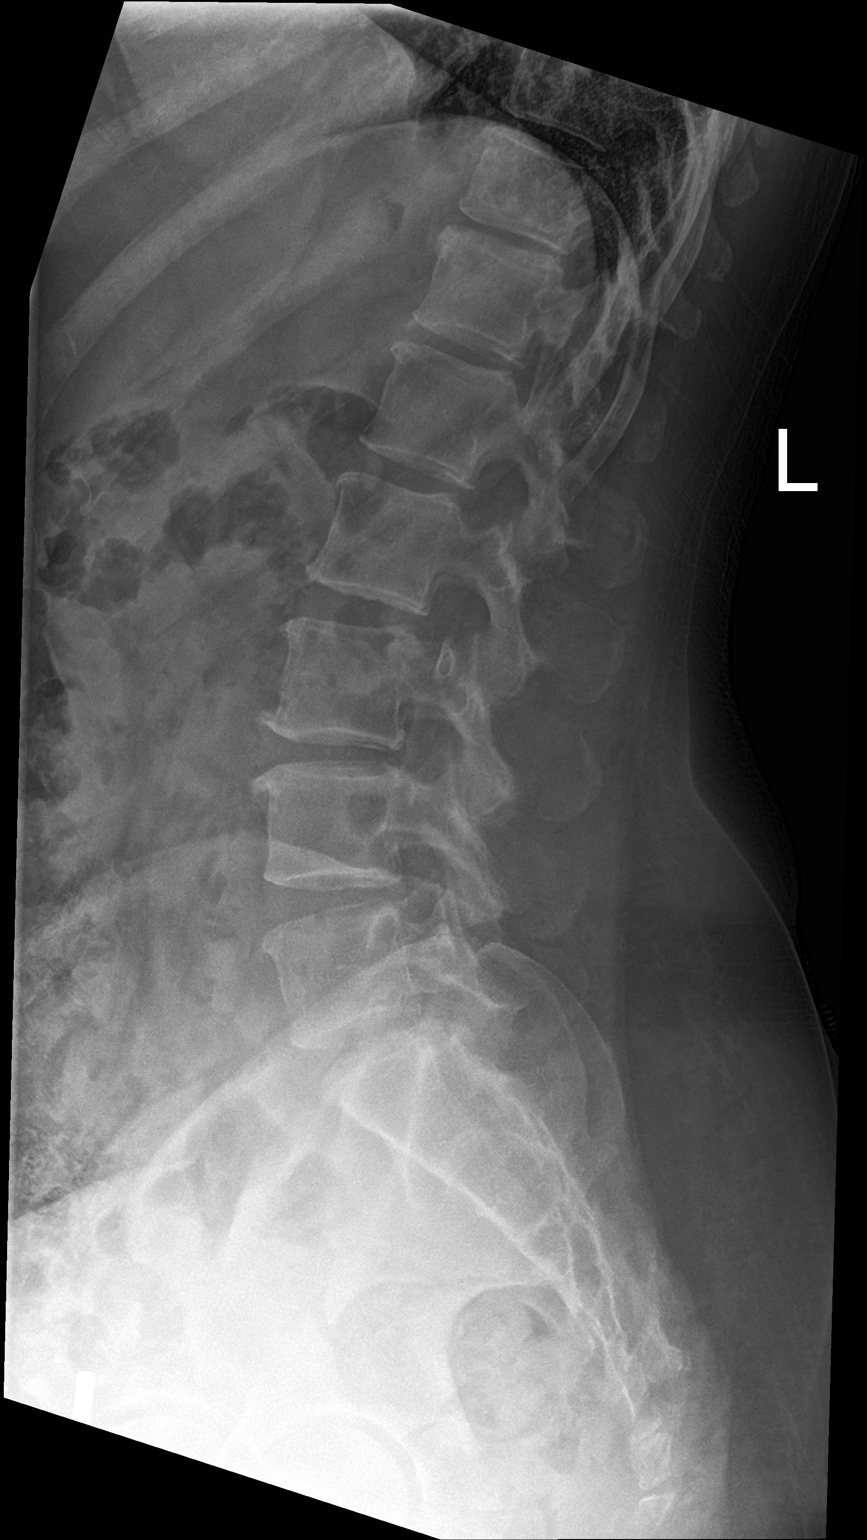

[l-spine spot]
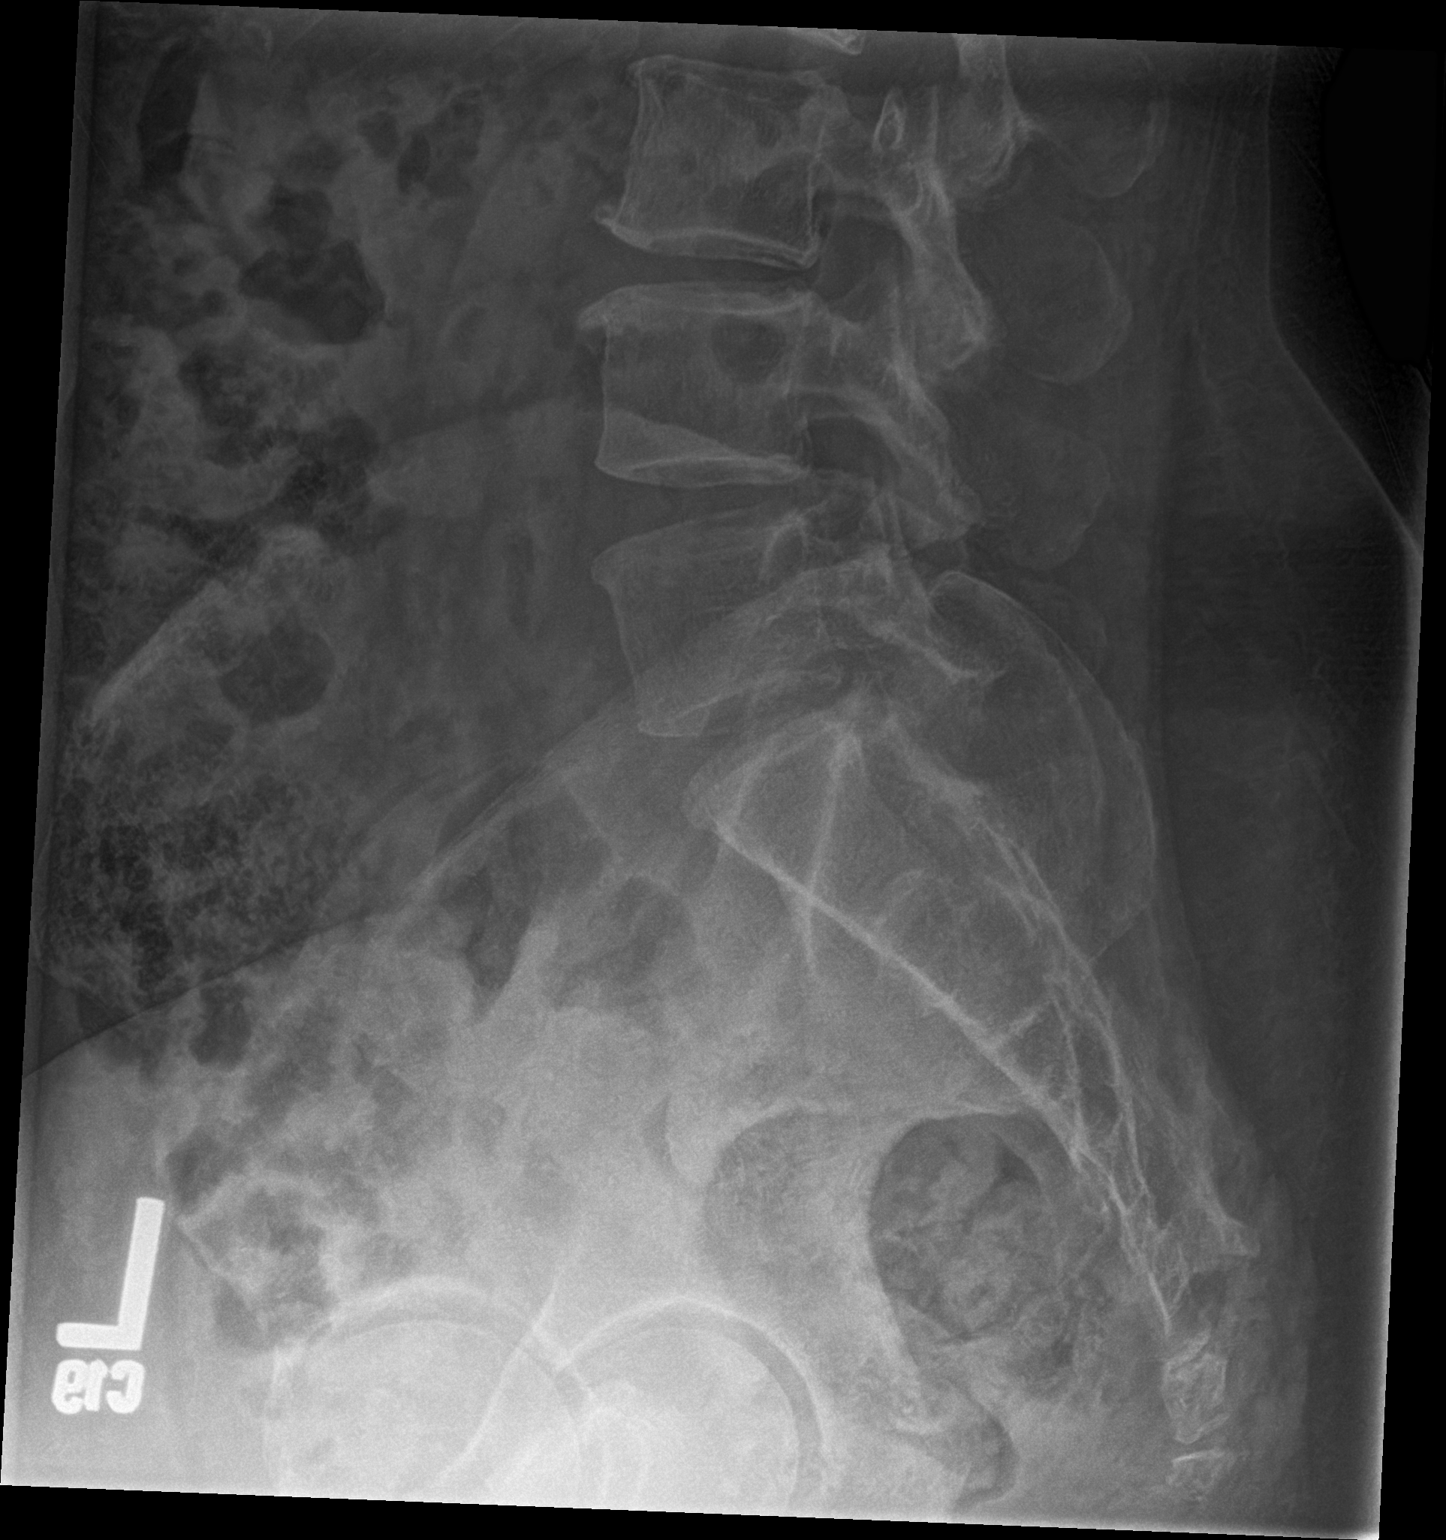

[3 of 3 positions shown; findings below may reference images not displayed]

FINDINGS: There are 5 lumbar type vertebral bodies. The alignment is normal.
Mild disc space narrowing is present at L3-4. There are mild facet
degenerative changes inferiorly. No evidence of acute fracture or
pars defect.
IMPRESSION: Mild spondylosis at L3-4 with mild facet disease inferiorly. No
acute osseous findings or malalignment.

## 2020-05-25 ENCOUNTER — Other Ambulatory Visit: Payer: Self-pay | Admitting: Medical

## 2020-05-27 ENCOUNTER — Ambulatory Visit: Payer: Managed Care, Other (non HMO) | Admitting: Internal Medicine

## 2020-05-27 DIAGNOSIS — Z0289 Encounter for other administrative examinations: Secondary | ICD-10-CM

## 2020-06-15 ENCOUNTER — Other Ambulatory Visit: Payer: Self-pay | Admitting: Medical

## 2020-07-21 ENCOUNTER — Telehealth: Payer: Self-pay

## 2020-07-21 NOTE — Telephone Encounter (Signed)
Pt transferring to Dr. Durene Cal.

## 2020-07-21 NOTE — Telephone Encounter (Signed)
Pt called requesting to transfer care from [facility] to Dr. Durene Cal. Pts sister sees Dr. Durene Cal. Ok to transfer?

## 2020-07-21 NOTE — Telephone Encounter (Signed)
Ok. To transfer.dixo

## 2020-07-21 NOTE — Telephone Encounter (Signed)
See below

## 2020-07-21 NOTE — Telephone Encounter (Signed)
Called and LVM to schedule appt. First available was 11/07/2020.

## 2020-07-21 NOTE — Telephone Encounter (Signed)
Yes thanks but needs to be full 40 minute slot and not just worked in a tend of a half day so may take several months

## 2020-08-18 ENCOUNTER — Telehealth: Payer: Self-pay | Admitting: Medical

## 2020-08-18 MED ORDER — LOSARTAN POTASSIUM 50 MG PO TABS: ORAL_TABLET | ORAL | 0 refills | Status: DC

## 2020-08-18 MED ORDER — METOPROLOL SUCCINATE ER 100 MG PO TB24: 100.0000 mg | ORAL_TABLET | Freq: Every day | ORAL | 3 refills | Status: DC

## 2020-08-18 NOTE — Telephone Encounter (Signed)
Medication: metoprolol succinate (TOPROL-XL) 100 MG 24 hr tablet [[id number]] ENDED     Has the patient contacted their pharmacy? no (If no, request that the patient contact the pharmacy for the refill.) (If yes, when and what did the pharmacy advise?)    Preferred Pharmacy (with phone number or street name):   [facility] [address] [place] - [address] Phone:  [phone]  Fax:  [phone]         Agent: Please be advised that RX refills may take up to 3 business days. We ask that you follow-up with your pharmacy.

## 2020-08-18 NOTE — Telephone Encounter (Signed)
Medication: losartan (COZAAR) 50 MG tablet    Has the patient contacted their pharmacy? No. (If no, request that the patient contact the pharmacy for the refill.) (If yes, when and what did the pharmacy advise?)  Preferred Pharmacy (with phone number or street name): [facility] [address], [place], [place] [address]  [phone]  Agent: Please be advised that RX refills may take up to 3 business days. We ask that you follow-up with your pharmacy.

## 2020-08-18 NOTE — Telephone Encounter (Signed)
Rx sent 

## 2020-08-18 NOTE — Telephone Encounter (Signed)
Rx  sent  to  [facility]

## 2020-09-20 ENCOUNTER — Telehealth: Payer: Self-pay | Admitting: Medical

## 2020-09-20 NOTE — Telephone Encounter (Signed)
Pt was trying to switch to Dr. Durene Cal. Will you let me know if she is scheduled/has appointment  to see him.

## 2020-09-22 NOTE — Telephone Encounter (Signed)
I see that patient has a new patient appointment scheduled with Dr. Durene Cal on 11/07/20.

## 2020-09-22 NOTE — Telephone Encounter (Signed)
Ok thanks for update

## 2020-10-04 IMAGING — CR DG CHEST 2V
2 series · 2 of 2 positions shown · non-contrast
Comparison: 02/26/2019

CLINICAL DATA: Chest pain

EXAM:
CHEST - 2 VIEW

[chest pa]
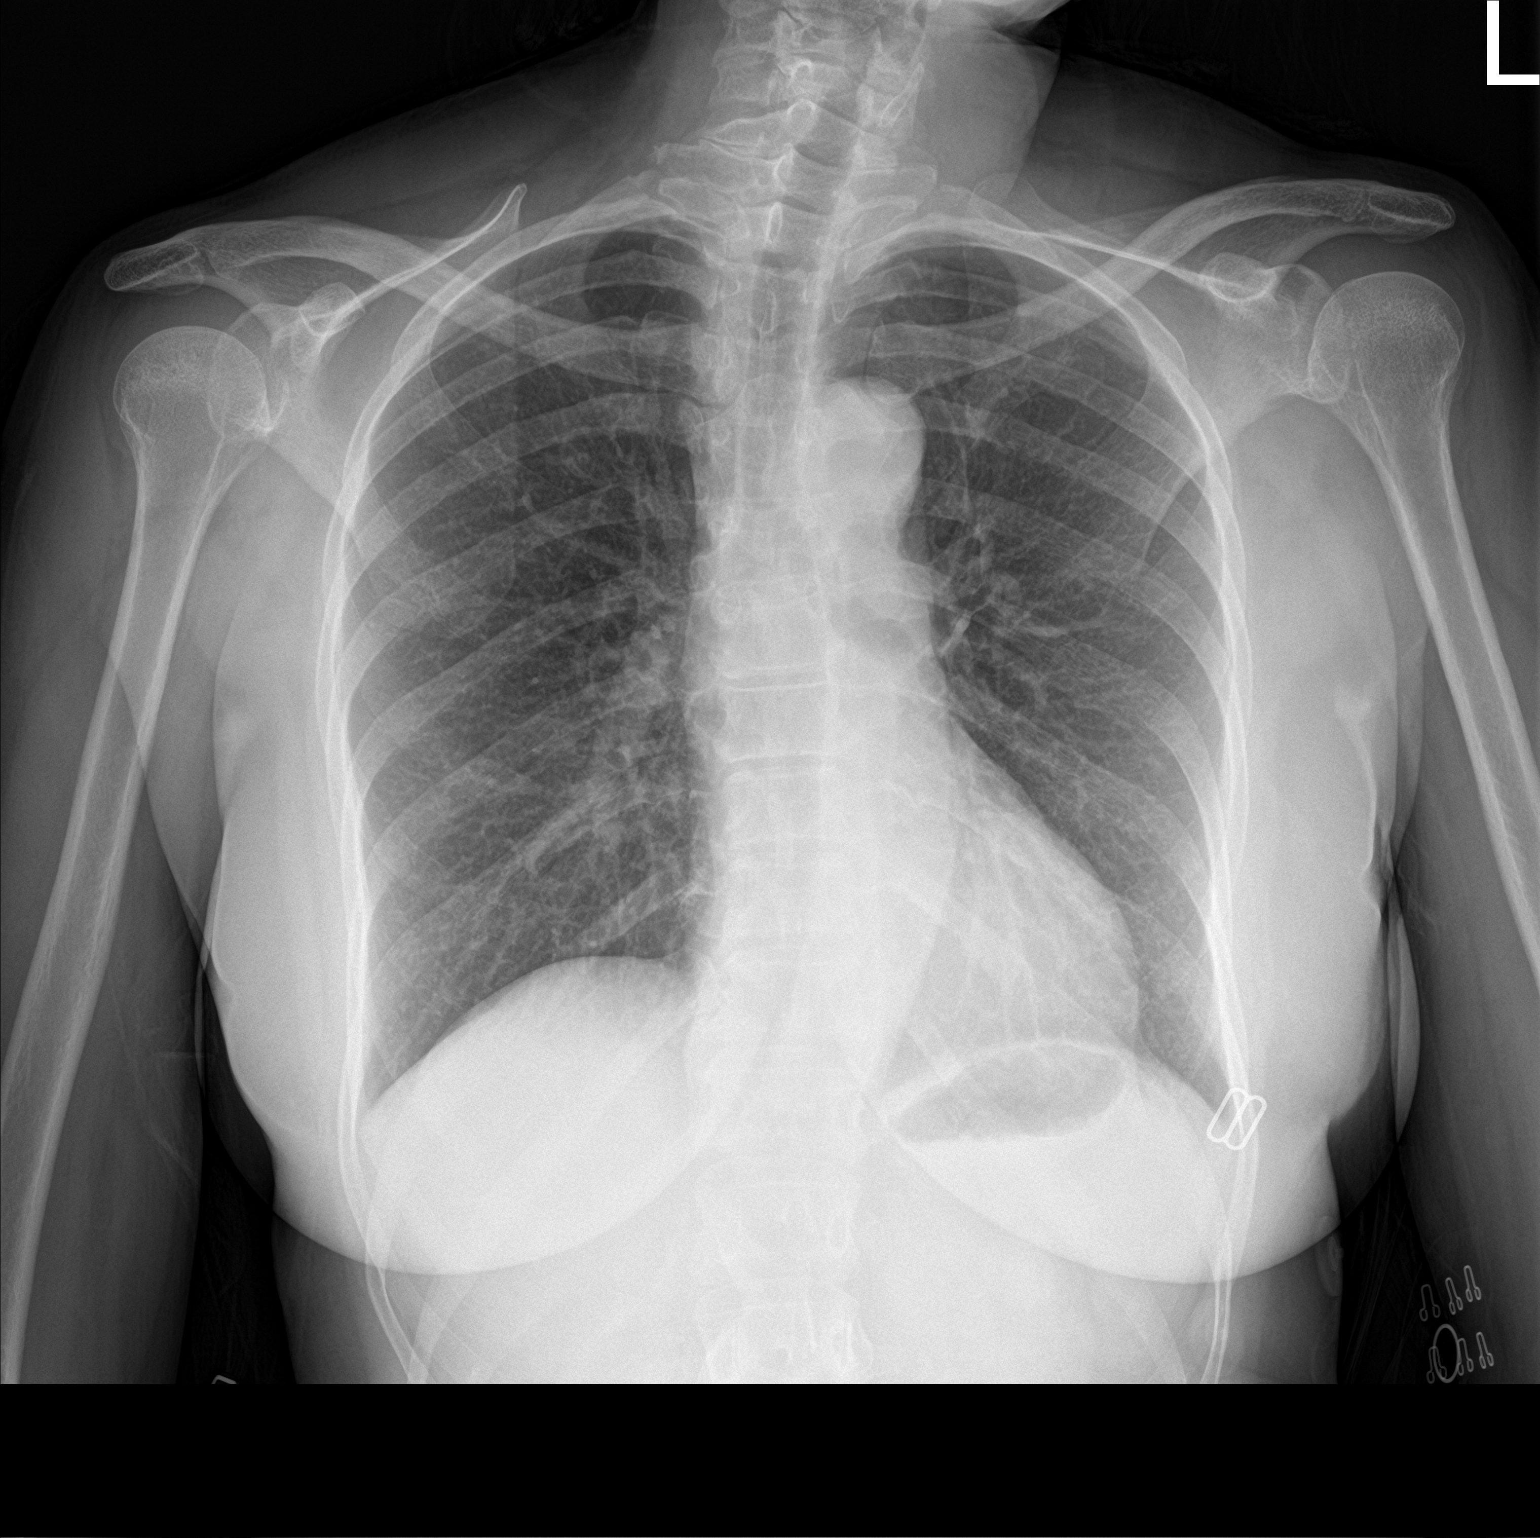

[chest lat]
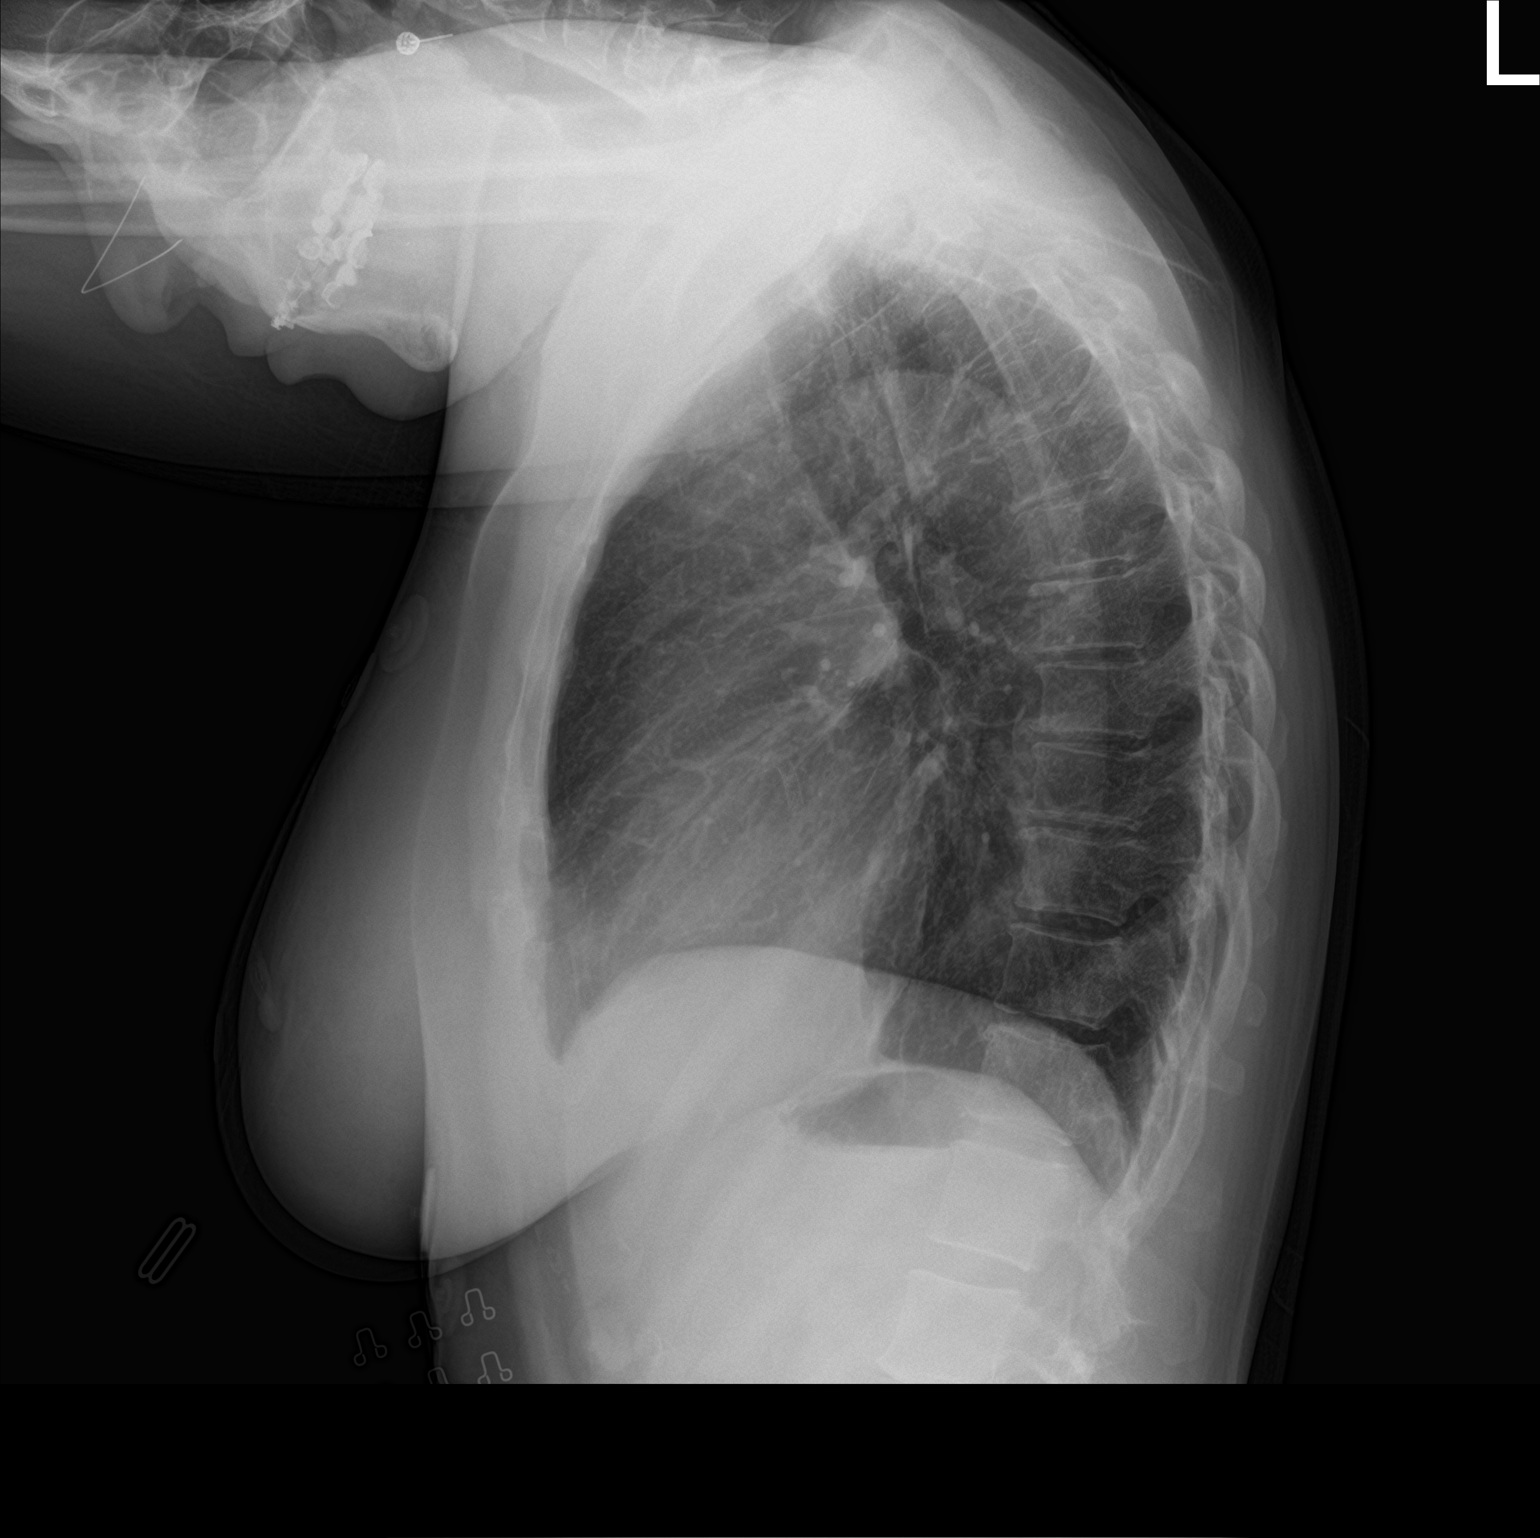

[2 of 2 positions shown; findings below may reference images not displayed]

FINDINGS: The heart size and mediastinal contours are within normal limits.
Both lungs are clear. Mild scoliosis
IMPRESSION: No active cardiopulmonary disease.

## 2020-10-30 ENCOUNTER — Telehealth: Payer: Self-pay | Admitting: Medical

## 2020-10-30 DIAGNOSIS — I10 Essential (primary) hypertension: Secondary | ICD-10-CM

## 2020-10-30 NOTE — Telephone Encounter (Signed)
[facility] called requesting for all of patients actives meds/scripts be now sent to them at [facility]. Patient states she should have around 10 meds

## 2020-10-31 ENCOUNTER — Ambulatory Visit: Payer: Managed Care, Other (non HMO) | Admitting: Family Medicine

## 2020-10-31 ENCOUNTER — Ambulatory Visit: Payer: Managed Care, Other (non HMO) | Admitting: Family

## 2020-10-31 MED ORDER — METOPROLOL SUCCINATE ER 100 MG PO TB24: 100.0000 mg | ORAL_TABLET | Freq: Every day | ORAL | 3 refills | Status: DC

## 2020-10-31 MED ORDER — TICAGRELOR 90 MG PO TABS: 90.0000 mg | ORAL_TABLET | Freq: Two times a day (BID) | ORAL | 0 refills | Status: DC

## 2020-10-31 MED ORDER — DICYCLOMINE HCL 20 MG PO TABS: 20.0000 mg | ORAL_TABLET | Freq: Three times a day (TID) | ORAL | 3 refills | Status: DC

## 2020-10-31 MED ORDER — HYDROXYZINE HCL 25 MG PO TABS: ORAL_TABLET | ORAL | 1 refills | Status: DC

## 2020-10-31 MED ORDER — AMLODIPINE BESYLATE 10 MG PO TABS: 10.0000 mg | ORAL_TABLET | Freq: Every day | ORAL | 1 refills | Status: DC

## 2020-10-31 MED ORDER — METHIMAZOLE 10 MG PO TABS: 10.0000 mg | ORAL_TABLET | Freq: Two times a day (BID) | ORAL | 1 refills | Status: DC

## 2020-10-31 MED ORDER — LOSARTAN POTASSIUM 50 MG PO TABS: ORAL_TABLET | ORAL | 0 refills | Status: DC

## 2020-10-31 MED ORDER — ATORVASTATIN CALCIUM 80 MG PO TABS: 80.0000 mg | ORAL_TABLET | Freq: Every day | ORAL | 3 refills | Status: DC

## 2020-10-31 MED ORDER — ALMOTRIPTAN MALATE 12.5 MG PO TABS: ORAL_TABLET | ORAL | 1 refills | Status: DC

## 2020-10-31 NOTE — Telephone Encounter (Signed)
9 medications sent to pharmacy

## 2020-10-31 NOTE — Progress Notes (Incomplete)
Subjective:   By signing my name below, I, Carla West, attest that this documentation has been prepared under the direction and in the presence of Sandford Craze NP. 10/31/2020      Patient ID: Carla West, female    DOB: [birth year], 60 y.o.   MRN: [id number]  No chief complaint on file.   HPI Patient is in today for a office visit.   Past Medical History:  Diagnosis Date  . Abnormal Pap smear of cervix 08/03/2015  . Benign essential HTN 08/03/2015  . Graves disease   . History of chicken pox   . Migraine headache 08/03/2015    Past Surgical History:  Procedure Laterality Date  . CORONARY STENT INTERVENTION N/A 02/27/2019   Procedure: CORONARY STENT INTERVENTION;  Surgeon: Kathleene Hazel, MD;  Location: [facility];  Service: Cardiovascular;  Laterality: N/A;  . LEFT HEART CATH AND CORONARY ANGIOGRAPHY N/A 02/27/2019   Procedure: LEFT HEART CATH AND CORONARY ANGIOGRAPHY;  Surgeon: Kathleene Hazel, MD;  Location: [facility];  Service: Cardiovascular;  Laterality: N/A;  . TUBAL LIGATION      Family History  Problem Relation Age of Onset  . Hypertension Mother   . Heart disease Mother   . Hyperlipidemia Father   . Breast cancer Maternal Grandmother   . Hyperlipidemia Sister   . Other Brother        plan crash in [employer]  . Migraines Son   . Heart disease Brother        CHF, arrythmia  . Hypertension Brother   . Gout Brother   . Hypertension Brother     Social History   Socioeconomic History  . Marital status: Divorced    Spouse name: Not on file  . Number of children: Not on file  . Years of education: Not on file  . Highest education level: Not on file  Occupational History  . Not on file  Tobacco Use  . Smoking status: Never Smoker  . Smokeless tobacco: Never Used  Vaping Use  . Vaping Use: Never used  Substance and Sexual Activity  . Alcohol use: No  . Drug use: No  . Sexual activity: Never    Comment: lives  with brother. 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 Not on file  Social History Narrative  . Not on file   Social Determinants of Health   Financial Resource Strain: Not on file  Food Insecurity: Not on file  Transportation Needs: Not on file  Physical Activity: Not on file  Stress: Not on file  Social Connections: Not on file  Intimate Partner Violence: Not on file    Outpatient Medications Prior to Visit  Medication Sig Dispense Refill  . Acetaminophen-Codeine (TYLENOL/CODEINE #3) 300-30 MG tablet Take 1 tablet by mouth every 6 (six) hours as needed for pain. 8 tablet 0  . almotriptan (AXERT) 12.5 MG tablet 1 tab po at onset of ha. Repeat in 2 hours if needed. Max 2 tabs in 24 hours 30 tablet 1  . amLODipine (NORVASC) 10 MG tablet Take 1 tablet (10 mg total) by mouth daily. 90 tablet 1  . aspirin EC 81 MG tablet Take 1 tablet (81 mg total) by mouth daily. 90 tablet 3  . atorvastatin (LIPITOR) 80 MG tablet Take 1 tablet (80 mg total) by mouth daily at 6 PM. 90 tablet 3  . cloNIDine (CATAPRES) 0.1 MG tablet TAKE 1 TABLET(0.1 MG) BY  MOUTH TWICE DAILY 60 tablet 0  . dicyclomine (BENTYL) 20 MG tablet Take 1 tablet (20 mg total) by mouth 4 (four) times daily -  before meals and at bedtime. 120 tablet 3  . hydrOXYzine (ATARAX/VISTARIL) 25 MG tablet 1 tab po q hs prn insomnia 90 tablet 1  . losartan (COZAAR) 50 MG tablet TAKE 1 TABLET(50 MG) BY MOUTH DAILY 90 tablet 0  . methimazole (TAPAZOLE) 10 MG tablet Take 1 tablet (10 mg total) by mouth 2 (two) times daily. Take 10 mg in a.m. and 10 mg in the evening, with meals 180 tablet 1  . metoprolol succinate (TOPROL-XL) 100 MG 24 hr tablet Take 1 tablet (100 mg total) by mouth daily. Take with or immediately following a meal. 90 tablet 3  . ondansetron (ZOFRAN ODT) 8 MG disintegrating tablet Take 1 tablet (8 mg total) by mouth every 8 (eight) hours as needed for nausea or vomiting. 20 tablet 0  .  promethazine (PHENERGAN) 25 MG tablet Take 1 tablet (25 mg total) by mouth every 6 (six) hours as needed for nausea or vomiting. 30 tablet 0  . sodium chloride (OCEAN) 0.65 % SOLN nasal spray Place 1 spray into both nostrils 2 (two) times daily as needed for congestion. 30 mL 6  . ticagrelor (BRILINTA) 90 MG TABS tablet Take 1 tablet (90 mg total) by mouth 2 (two) times daily. 180 tablet 0  . triamterene-hydrochlorothiazide (MAXZIDE-25) 37.5-25 MG tablet TAKE 1 TABLET BY MOUTH DAILY 90 tablet 0   No facility-administered medications prior to visit.    Allergies  Allergen Reactions  . Elavil [Amitriptyline Hcl] Other (See Comments)    Jittery..the patient states she has not had any side effects     ROS     Objective:    Physical Exam Constitutional:      Appearance: Normal appearance.  HENT:     Head: Normocephalic and atraumatic.     Right Ear: External ear normal.     Left Ear: External ear normal.  Eyes:     Extraocular Movements: Extraocular movements intact.     Pupils: Pupils are equal, round, and reactive to light.  Cardiovascular:     Rate and Rhythm: Normal rate and regular rhythm.     Pulses: Normal pulses.     Heart sounds: Normal heart sounds. No murmur heard. No gallop.   Pulmonary:     Effort: Pulmonary effort is normal. No respiratory distress.     Breath sounds: Normal breath sounds. No wheezing, rhonchi or rales.  Skin:    General: Skin is warm and dry.  Neurological:     Mental Status: She is alert and oriented to person, place, and time.  Psychiatric:        Behavior: Behavior normal.     There were no vitals taken for this visit. Wt Readings from Last 3 Encounters:  03/04/20 150 lb (68 kg)  03/04/20 150 lb 12.8 oz (68.4 kg)  02/29/20 151 lb 12.8 oz (68.9 kg)    Diabetic Foot Exam - Simple   No data filed    Lab Results  Component Value Date   WBC 5.0 03/12/2020   HGB 12.4 03/12/2020   HCT 37.9 03/12/2020   PLT 304 03/12/2020   GLUCOSE  134 (H) 03/12/2020   CHOL 217 (H) 02/13/2020   TRIG 63 02/13/2020   HDL 78 02/13/2020   LDLCALC 124 (H) 02/13/2020   ALT 16 03/12/2020   AST 13 03/12/2020   NA 140  03/12/2020   K 4.2 03/12/2020   CL 104 03/12/2020   CREATININE 0.98 03/12/2020   BUN 28 (H) 03/12/2020   CO2 24 03/12/2020   TSH <0.01 (L) 03/12/2020   INR 1.1 02/29/2020   HGBA1C 5.9 (H) 02/13/2020    Lab Results  Component Value Date   TSH <0.01 (L) 03/12/2020   Lab Results  Component Value Date   WBC 5.0 03/12/2020   HGB 12.4 03/12/2020   HCT 37.9 03/12/2020   MCV 82.6 03/12/2020   PLT 304 03/12/2020   Lab Results  Component Value Date   NA 140 03/12/2020   K 4.2 03/12/2020   CO2 24 03/12/2020   GLUCOSE 134 (H) 03/12/2020   BUN 28 (H) 03/12/2020   CREATININE 0.98 03/12/2020   BILITOT 0.3 03/12/2020   ALKPHOS 92 02/29/2020   AST 13 03/12/2020   ALT 16 03/12/2020   PROT 6.7 03/12/2020   ALBUMIN 3.8 02/29/2020   CALCIUM 9.3 03/12/2020   ANIONGAP 13 02/29/2020   GFR 84.65 10/01/2019   Lab Results  Component Value Date   CHOL 217 (H) 02/13/2020   Lab Results  Component Value Date   HDL 78 02/13/2020   Lab Results  Component Value Date   LDLCALC 124 (H) 02/13/2020   Lab Results  Component Value Date   TRIG 63 02/13/2020   Lab Results  Component Value Date   CHOLHDL 2.8 02/13/2020   Lab Results  Component Value Date   HGBA1C 5.9 (H) 02/13/2020       Assessment & Plan:   Problem List Items Addressed This Visit   None      No orders of the defined types were placed in this encounter.   I, Sandford Craze NP, personally preformed the services described in this documentation.  All medical record entries made by the scribe were at my direction and in my presence.  I have reviewed the chart and discharge instructions (if applicable) and agree that the record reflects my personal performance and is accurate and complete. 10/31/2020   I,Carla West,acting as a Neurosurgeon for  Lemont Fillers, NP.,have documented all relevant documentation on the behalf of Lemont Fillers, NP,as directed by  Lemont Fillers, NP while in the presence of Lemont Fillers, NP.   Carla [employer]

## 2020-11-03 ENCOUNTER — Other Ambulatory Visit: Payer: Self-pay

## 2020-11-03 DIAGNOSIS — I10 Essential (primary) hypertension: Secondary | ICD-10-CM

## 2020-11-03 MED ORDER — TICAGRELOR 90 MG PO TABS: 90.0000 mg | ORAL_TABLET | Freq: Two times a day (BID) | ORAL | 0 refills | Status: DC

## 2020-11-03 MED ORDER — DICYCLOMINE HCL 20 MG PO TABS: 20.0000 mg | ORAL_TABLET | Freq: Three times a day (TID) | ORAL | 3 refills | Status: DC

## 2020-11-03 MED ORDER — METHIMAZOLE 10 MG PO TABS: 10.0000 mg | ORAL_TABLET | Freq: Two times a day (BID) | ORAL | 1 refills | Status: DC

## 2020-11-03 MED ORDER — ATORVASTATIN CALCIUM 80 MG PO TABS: 80.0000 mg | ORAL_TABLET | Freq: Every day | ORAL | 3 refills | Status: DC

## 2020-11-03 MED ORDER — LOSARTAN POTASSIUM 50 MG PO TABS: ORAL_TABLET | ORAL | 0 refills | Status: DC

## 2020-11-03 MED ORDER — METOPROLOL SUCCINATE ER 100 MG PO TB24: 100.0000 mg | ORAL_TABLET | Freq: Every day | ORAL | 3 refills | Status: DC

## 2020-11-03 MED ORDER — ALMOTRIPTAN MALATE 12.5 MG PO TABS: ORAL_TABLET | ORAL | 1 refills | Status: DC

## 2020-11-03 MED ORDER — HYDROXYZINE HCL 25 MG PO TABS: ORAL_TABLET | ORAL | 1 refills | Status: DC

## 2020-11-03 MED ORDER — AMLODIPINE BESYLATE 10 MG PO TABS: 10.0000 mg | ORAL_TABLET | Freq: Every day | ORAL | 1 refills | Status: DC

## 2020-11-04 ENCOUNTER — Other Ambulatory Visit: Payer: Self-pay

## 2020-11-04 ENCOUNTER — Ambulatory Visit (INDEPENDENT_AMBULATORY_CARE_PROVIDER_SITE_OTHER): Payer: PRIVATE HEALTH INSURANCE | Admitting: Family

## 2020-11-04 ENCOUNTER — Other Ambulatory Visit (HOSPITAL_COMMUNITY)
Admission: RE | Admit: 2020-11-04 | Discharge: 2020-11-04 | Disposition: A | Discharge status: Home / Self Care | Payer: PRIVATE HEALTH INSURANCE | Source: Ambulatory Visit | Attending: Family | Admitting: Family

## 2020-11-04 ENCOUNTER — Encounter: Payer: Self-pay | Admitting: Family

## 2020-11-04 VITALS — BP 169/102 | HR 84 | Temp 98.2°F | Resp 16 | Ht 67.0 in | Wt 160.0 lb

## 2020-11-04 DIAGNOSIS — L989 Disorder of the skin and subcutaneous tissue, unspecified: Secondary | ICD-10-CM | POA: Diagnosis not present

## 2020-11-04 DIAGNOSIS — Z01419 Encounter for gynecological examination (general) (routine) without abnormal findings: Secondary | ICD-10-CM

## 2020-11-04 DIAGNOSIS — E0590 Thyrotoxicosis, unspecified without thyrotoxic crisis or storm: Secondary | ICD-10-CM

## 2020-11-04 DIAGNOSIS — I10 Essential (primary) hypertension: Secondary | ICD-10-CM | POA: Diagnosis not present

## 2020-11-04 DIAGNOSIS — Z0000 Encounter for general adult medical examination without abnormal findings: Secondary | ICD-10-CM | POA: Diagnosis not present

## 2020-11-04 DIAGNOSIS — Z23 Encounter for immunization: Secondary | ICD-10-CM | POA: Diagnosis not present

## 2020-11-04 DIAGNOSIS — Z1211 Encounter for screening for malignant neoplasm of colon: Secondary | ICD-10-CM

## 2020-11-04 DIAGNOSIS — E785 Hyperlipidemia, unspecified: Secondary | ICD-10-CM

## 2020-11-04 MED ORDER — ALMOTRIPTAN MALATE 12.5 MG PO TABS: ORAL_TABLET | ORAL | 1 refills | Status: DC

## 2020-11-04 MED ORDER — ATORVASTATIN CALCIUM 80 MG PO TABS: 80.0000 mg | ORAL_TABLET | Freq: Every day | ORAL | 3 refills | Status: DC

## 2020-11-04 MED ORDER — AMLODIPINE BESYLATE 10 MG PO TABS: 10.0000 mg | ORAL_TABLET | Freq: Every day | ORAL | 1 refills | Status: DC

## 2020-11-04 MED ORDER — HYDROXYZINE HCL 25 MG PO TABS: ORAL_TABLET | ORAL | 1 refills | Status: DC

## 2020-11-04 MED ORDER — DICYCLOMINE HCL 20 MG PO TABS: 20.0000 mg | ORAL_TABLET | Freq: Three times a day (TID) | ORAL | 3 refills | Status: DC

## 2020-11-04 MED ORDER — METHIMAZOLE 10 MG PO TABS
10.0000 mg | ORAL_TABLET | Freq: Two times a day (BID) | ORAL | 1 refills | Status: DC
Start: 2020-11-04 — End: 2020-11-04

## 2020-11-04 MED ORDER — LOSARTAN POTASSIUM 50 MG PO TABS: 100.0000 mg | ORAL_TABLET | Freq: Every day | ORAL | 0 refills | Status: DC

## 2020-11-04 MED ORDER — LOSARTAN POTASSIUM 50 MG PO TABS: ORAL_TABLET | ORAL | 0 refills | Status: DC

## 2020-11-04 MED ORDER — METOPROLOL SUCCINATE ER 100 MG PO TB24: 100.0000 mg | ORAL_TABLET | Freq: Every day | ORAL | 3 refills | Status: DC

## 2020-11-04 MED ORDER — TICAGRELOR 90 MG PO TABS: 90.0000 mg | ORAL_TABLET | Freq: Two times a day (BID) | ORAL | 0 refills | Status: DC

## 2020-11-04 NOTE — Patient Instructions (Addendum)
Please complete lab work prior to leaving.  Please increase losartan to 2 tabs once daily.

## 2020-11-04 NOTE — Addendum Note (Signed)
Addended by: Maximino Sarin on: 11/04/2020 02:13 PM   Modules accepted: Orders

## 2020-11-04 NOTE — Progress Notes (Signed)
Subjective:    Patient ID: Carla West, female    DOB: April 15, 1961, 60 y.o.   MRN: [id number]  HPI  Patient presents today for complete physical.  Immunizations: She has had 2 covid vaccines, no flu shot.  Would like shingrix #1 Diet: [facility] Some walking Wt Readings from Last 3 Encounters:  11/04/20 160 lb (72.6 kg)  03/04/20 150 lb (68 kg)  03/04/20 150 lb 12.8 oz (68.4 kg)  Colonoscopy: due Pap Smear:  10/19 Mammogram: 02/28/20 Vision: up to date Dental:  Up to date     Review of Systems  Constitutional: Positive for unexpected weight change.  HENT: Negative for hearing loss and rhinorrhea.   Eyes: Negative for visual disturbance.  Respiratory: Negative for cough and shortness of breath.   Cardiovascular: Negative for chest pain.  Gastrointestinal: Negative for constipation and diarrhea.  Genitourinary: Negative for dysuria, frequency and hematuria.  Musculoskeletal: Negative for arthralgias and myalgias.  Skin: Negative for rash.  Neurological: Positive for headaches (occasional migraines).  Hematological: Negative for adenopathy.  Psychiatric/Behavioral:       Denies depression or anxiety       Past Medical History:  Diagnosis Date  . Abnormal Pap smear of cervix 08/03/2015  . Benign essential HTN 08/03/2015  . Graves disease   . History of chicken pox   . Migraine headache 08/03/2015     Social History   Socioeconomic History  . Marital status: Divorced    Spouse name: Not on file  . Number of children: Not on file  . Years of education: Not on file  . Highest education level: Not on file  Occupational History  . Not on file  Tobacco Use  . Smoking status: Never Smoker  . Smokeless tobacco: Never Used  Vaping Use  . Vaping Use: Never used  Substance and Sexual Activity  . Alcohol use: No  . Drug use: No  . 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 CORONARY STENT INTERVENTION N/A 02/27/2019   Procedure: CORONARY STENT INTERVENTION;  Surgeon: Kathleene Hazel, MD;  Location: [facility];  Service: Cardiovascular;  Laterality: N/A;  . LEFT HEART CATH AND CORONARY ANGIOGRAPHY N/A 02/27/2019   Procedure: LEFT HEART CATH AND CORONARY ANGIOGRAPHY;  Surgeon: Kathleene Hazel, MD;  Location: [facility];  Service: Cardiovascular;  Laterality: N/A;  . TUBAL LIGATION      Family History  Problem Relation Age of Onset  . Hypertension Mother   . Heart disease Mother   . Hyperlipidemia Father   . Breast cancer Maternal Grandmother   . Hyperlipidemia Sister   . Other Brother        plan crash in [employer]  . Migraines Son   .  Heart disease Brother        CHF, arrythmia  . Hypertension Brother   . Gout Brother   . Hypertension Brother     Allergies  Allergen Reactions  . Elavil [Amitriptyline Hcl] Other (See Comments)    Jittery..the patient states she has not had any side effects     Current Outpatient Medications on File Prior to Visit  Medication Sig Dispense Refill  . almotriptan (AXERT) 12.5 MG tablet 1 tab po at onset of ha. Repeat in 2 hours if needed. Max 2 tabs in 24 hours 30 tablet 1  . amLODipine (NORVASC) 10 MG tablet Take 1 tablet (10 mg total) by mouth daily. 90 tablet 1  . aspirin EC 81 MG tablet Take 1 tablet (81 mg total)  by mouth daily. 90 tablet 3  . atorvastatin (LIPITOR) 80 MG tablet Take 1 tablet (80 mg total) by mouth daily at 6 PM. 90 tablet 3  . cloNIDine (CATAPRES) 0.1 MG tablet TAKE 1 TABLET(0.1 MG) BY MOUTH TWICE DAILY 60 tablet 0  . dicyclomine (BENTYL) 20 MG tablet Take 1 tablet (20 mg total) by mouth 4 (four) times daily -  before meals and at bedtime. 120 tablet 3  . hydrOXYzine (ATARAX/VISTARIL) 25 MG tablet 1 tab po q hs prn insomnia 90 tablet 1  . methimazole (TAPAZOLE) 10 MG tablet Take 1 tablet (10 mg total) by mouth 2 (two) times daily. Take 10 mg in a.m. and 10 mg in the evening, with meals 180 tablet 1  . metoprolol succinate (TOPROL-XL) 100 MG 24 hr tablet Take 1 tablet (100 mg total) by mouth daily. Take with or immediately following a meal. 90 tablet 3  . ondansetron (ZOFRAN ODT) 8 MG disintegrating tablet Take 1 tablet (8 mg total) by mouth every 8 (eight) hours as needed for nausea or vomiting. 20 tablet 0  . promethazine (PHENERGAN) 25 MG tablet Take 1 tablet (25 mg total) by mouth every 6 (six) hours as needed for nausea or vomiting. 30 tablet 0  . sodium chloride (OCEAN) 0.65 % SOLN nasal spray Place 1 spray into both nostrils 2 (two) times daily as needed for congestion. 30 mL 6  . ticagrelor (BRILINTA) 90 MG TABS tablet Take 1 tablet (90 mg total) by mouth 2 (two) times daily. 180 tablet 0  . triamterene-hydrochlorothiazide (MAXZIDE-25) 37.5-25 MG tablet TAKE 1 TABLET BY MOUTH DAILY 90 tablet 0   No current facility-administered medications on file prior to visit.    BP (!) 169/102 (BP Location: Right Arm, Patient Position: Sitting, Cuff Size: Small)   Pulse 84   Temp 98.2 F (36.8 C) (Oral)   Resp 16   Ht 5\' 7"  (1.702 m)   Wt 160 lb (72.6 kg)   SpO2 99%   BMI 25.06 kg/m    Objective:   Physical Exam  Physical Exam  Constitutional: She is oriented to person, place, and time. She appears well-developed and well-nourished. No distress.  HENT:  Head: Normocephalic and  atraumatic.  Right Ear: Tympanic membrane and ear canal normal.  Left Ear: Tympanic membrane and ear canal normal.  Mouth/Throat: Oropharynx is clear and moist.  Eyes: Pupils are equal, round, and reactive to light. No scleral icterus.  Neck: Normal range of motion. No thyromegaly present.  Cardiovascular: Normal rate and regular rhythm.   No murmur heard. Pulmonary/Chest: Effort normal and breath sounds normal. No respiratory distress. He has no wheezes. She has no rales. She exhibits no tenderness.  Abdominal: Soft.  Bowel sounds are normal. She exhibits no distension and no mass. There is no tenderness. There is no rebound and no guarding.  Musculoskeletal: She exhibits no edema.  Lymphadenopathy:    She has no cervical adenopathy.  Neurological: She is alert and oriented to person, place, and time. She has normal patellar reflexes. She exhibits normal muscle tone. Coordination normal.  Skin: Skin is warm and dry. (right lower cheek- teardrop shaped brown flat lesion). Psychiatric: She has a normal mood and affect. Her behavior is normal. Judgment and thought content normal.  Breasts: Examined lying Right: Without masses, retractions, discharge or axillary adenopathy.  Left: Without masses, retractions, discharge or axillary adenopathy.  Inguinal/mons: Normal without inguinal adenopathy  External genitalia: Normal  BUS/Urethra/Skene's glands: Normal  Bladder: Normal  Vagina: Normal  Cervix: Normal  Uterus: normal in size, shape and contour. Midline and mobile  Adnexa/parametria:  Rt: Without masses or tenderness.  Lt: Without masses or tenderness.  Anus and perineum: Normal            Assessment & Plan:         Assessment & Plan:

## 2020-11-05 ENCOUNTER — Telehealth: Payer: Self-pay | Admitting: Family

## 2020-11-05 DIAGNOSIS — L989 Disorder of the skin and subcutaneous tissue, unspecified: Secondary | ICD-10-CM | POA: Insufficient documentation

## 2020-11-05 LAB — COMPREHENSIVE METABOLIC PANEL
ALT: 14 U/L (ref 0–35)
AST: 14 U/L (ref 0–37)
Albumin: 4 g/dL (ref 3.5–5.2)
Alkaline Phosphatase: 91 U/L (ref 39–117)
BUN: 19 mg/dL (ref 6–23)
CO2: 29 mEq/L (ref 19–32)
Calcium: 9 mg/dL (ref 8.4–10.5)
Chloride: 107 mEq/L (ref 96–112)
Creatinine, Ser: 0.63 mg/dL (ref 0.40–1.20)
GFR: 98.22 mL/min (ref >60.00)
Glucose, Bld: 107 mg/dL — ABNORMAL HIGH (ref 70–99)
Potassium: 4.1 mEq/L (ref 3.5–5.1)
Sodium: 144 mEq/L (ref 135–145)
Total Bilirubin: 0.3 mg/dL (ref 0.2–1.2)
Total Protein: 6.5 g/dL (ref 6.0–8.3)

## 2020-11-05 LAB — LIPID PANEL
Cholesterol: 175 mg/dL (ref 0–200)
HDL: 67.3 mg/dL (ref >39.00)
LDL Cholesterol: 93 mg/dL (ref 0–99)
NonHDL: 107.42
Total CHOL/HDL Ratio: 3
Triglycerides: 74 mg/dL (ref 0.0–149.0)
VLDL: 14.8 mg/dL (ref 0.0–40.0)

## 2020-11-05 LAB — TSH: TSH: <0.01 u[IU]/mL — ABNORMAL LOW (ref 0.35–4.50)

## 2020-11-05 NOTE — Telephone Encounter (Signed)
See mychart.  

## 2020-11-05 NOTE — Assessment & Plan Note (Signed)
Discussed healthy diet, exercise.  Refer for colo. Pap performed.  Shingrix #1, recommended covid booster and flu shot next season. Labs as ordered.

## 2020-11-05 NOTE — Assessment & Plan Note (Signed)
BP Readings from Last 3 Encounters:  11/04/20 (!) 169/102  03/04/20 120/88  03/04/20 (!) 140/100   BP is elevated today. Recommended that she increase losartan from 50mg  to 100mg  daily and keep her upcoming appointment with Dr. .

## 2020-11-05 NOTE — Assessment & Plan Note (Signed)
New. Refer to dermatology for evaluation. 

## 2020-11-05 NOTE — Assessment & Plan Note (Signed)
TSH remains low. Continue tapazole- pt to follow up with Endo.

## 2020-11-06 NOTE — Patient Instructions (Addendum)
Health Maintenance Due  Topic Date Due  . COVID-19 Vaccine (1)- please send [place] dates of 1 and 2 - ER trips on both of these so holding off on #3 Never done  . COLONOSCOPY - opted for cologuard- let us know if you do not receive this within a month Never done   Please stop by lab before you go If you have mychart- we will send your results within 3 business days of [place] receiving them.  If you do not have mychart- we will call you about results within 5 business days of [place] receiving them.  *please also note that you will see labs on mychart as soon as they post. I will later go in and write notes on them- will say "notes from Dr. Durene Cal"  Pleasure to see you again and thrilled to have you as a patient!   1 month follow up after potential methimazole change, update me in 2 weeks with blood pressures

## 2020-11-06 NOTE — Progress Notes (Signed)
Phone: [phone]   Subjective:  Patient presents today to establish care with me as their new primary care provider. Patient was formerly a patient of Dr. Alvira Monday.  Chief Complaint  Patient presents with  . Transfer of care   See problem oriented charting  The following were reviewed and entered/updated in epic: Past Medical History:  Diagnosis Date  . Abnormal Pap smear of cervix 08/03/2015  . Anxiety   . Benign essential HTN 08/03/2015  . Depression   . Graves disease   . History of chicken pox   . Migraine headache 08/03/2015   Patient Active Problem List   Diagnosis Date Noted  . CAD (coronary artery disease) 11/07/2020    Priority: High  . Hyperthyroidism 02/26/2019    Priority: High  . Essential hypertension 08/03/2015    Priority: High  . Palpitations 04/11/2018    Priority: Medium  . Hyperlipidemia 12/28/2017    Priority: Medium  . Migraine headache 08/03/2015    Priority: Medium  . Skin lesion 11/05/2020    Priority: Low  . Abnormal Pap smear of cervix 08/03/2015    Priority: Low   Past Surgical History:  Procedure Laterality Date  . CORONARY STENT INTERVENTION N/A 02/27/2019   Procedure: CORONARY STENT INTERVENTION;  Surgeon: Kathleene Hazel, MD;  [facility];  Service: Cardiovascular;  Laterality: N/A;  . LEFT HEART CATH AND CORONARY ANGIOGRAPHY N/A 02/27/2019   Procedure: LEFT HEART CATH AND CORONARY ANGIOGRAPHY;  Surgeon: Kathleene Hazel, MD;  Location: [facility];  Service: Cardiovascular;  Laterality: N/A;  . TUBAL LIGATION      Family History  Problem Relation Age of Onset  . Hypertension Mother   . Heart disease Mother        in late 38s  . Hyperlipidemia Father   . Stroke Father        later in life  . Breast cancer Maternal Grandmother   . Hyperlipidemia Sister   . Other Brother        plan crash in [employer]  . Migraines Son   . Heart disease Brother        CHF, arrythmia  . Liver disease  Brother        alcohol  . Hypertension Brother   . Gout Brother   . Hypertension Brother     Medications- reviewed and updated Current Outpatient Medications  Medication Sig Dispense Refill  . almotriptan (AXERT) 12.5 MG tablet 1 tab po at onset of ha. Repeat in 2 hours if needed. Max 2 tabs in 24 hours 30 tablet 1  . amLODipine (NORVASC) 10 MG tablet Take 1 tablet (10 mg total) by mouth daily. 90 tablet 1  . aspirin EC 81 MG tablet Take 1 tablet (81 mg total) by mouth daily. 90 tablet 3  . atorvastatin (LIPITOR) 80 MG tablet Take 1 tablet (80 mg total) by mouth daily at 6 PM. 90 tablet 3  . dicyclomine (BENTYL) 20 MG tablet Take 1 tablet (20 mg total) by mouth 4 (four) times daily -  before meals and at bedtime. 120 tablet 3  . ezetimibe (ZETIA) 10 MG tablet Take 1 tablet (10 mg total) by mouth daily. 90 tablet 3  . hydrOXYzine (ATARAX/VISTARIL) 25 MG tablet 1 tab po q hs prn insomnia 90 tablet 1  . losartan (COZAAR) 50 MG tablet Take 2 tablets (100 mg total) by mouth daily. TAKE 1 TABLET(50 MG) BY MOUTH DAILY 90 tablet 0  . methimazole (  TAPAZOLE) 10 MG tablet Take 1 tablet (10 mg total) by mouth 2 (two) times daily. Take 10 mg in a.m. and 10 mg in the evening, with meals 180 tablet 1  . metoprolol succinate (TOPROL-XL) 100 MG 24 hr tablet Take 1 tablet (100 mg total) by mouth daily. Take with or immediately following a meal. 90 tablet 3  . ticagrelor (BRILINTA) 90 MG TABS tablet Take 1 tablet (90 mg total) by mouth 2 (two) times daily. 180 tablet 0  . promethazine (PHENERGAN) 25 MG tablet Take 1 tablet (25 mg total) by mouth every 6 (six) hours as needed for nausea or vomiting. (Patient not taking: Reported on 11/07/2020) 30 tablet 0  . triamterene-hydrochlorothiazide (MAXZIDE-25) 37.5-25 MG tablet TAKE 1 TABLET BY MOUTH DAILY 90 tablet 0   No current facility-administered medications for this visit.    Allergies-reviewed and updated Allergies  Allergen Reactions  . Elavil  [Amitriptyline Hcl] Other (See Comments)    Jittery..the patient states she has not had any side effects     Social History   Social History Narrative   Divorced 2018. 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   Objective  Objective:  BP (!) 156/100 (BP Location: Left Arm, Patient Position: Sitting, Cuff Size: Normal)   Pulse 90   Temp 98.5 F (36.9 C) (Temporal)   Ht 5\' 7"  (1.702 m)   Wt 160 lb 8 oz (72.8 kg)   SpO2 97%   BMI 25.14 kg/m  Gen: NAD, resting comfortably CV: RRR no murmurs rubs or gallops Lungs: CTAB no crackles, wheeze, rhonchi Abdomen: soft/nontender/nondistended/normal bowel sounds.  Ext: no edema Skin: warm, dry   Assessment and Plan:    #social update - brother passed away recently couldn't return to old office where he was cared for so wanted to transfer to another office. Down from family of 8 to family of 4 - with other recent losses including parents  #CAD with history of stent 02/27/2019 #hyperlipidemia S: Medication: brilinta 90 mg BID, aspirin 81mg , atorvastatin 80mg  Lab Results  Component Value Date   CHOL 175 11/04/2020   HDL 67.30 11/04/2020   LDLCALC 93 11/04/2020   TRIG 74.0 11/04/2020   CHOLHDL 3 11/04/2020   A/P: CAD asymptomatic but we need to get her BP down- discussed working on thyroid as that could be potential trigger for higher BP - add zetia 10 mg with LDL above 70. Discussed will need this along with atorvastatin 80mg   #hypertension S: medication: amlodipine 10 mg, losartan 100mg , metoprolol 100mg  XR, triamterene-hctz 37.5-25mg  Home readings #s: 150s or more/100 at home -normal renal arteries 03/12/20 BP Readings from Last 3 Encounters:  11/07/20 (!) 156/100  11/04/20 (!) 169/102  03/04/20 120/88  A/P: poor control today and at home- I am concerned  hyperthyroid/graves is contributing. Already on 5 BP medications which have had to be escalated without improvement in BP.  Recommended close follow up with endocrine. 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 (L) 11/04/2020   A/P:poor control based on last tsh plus T4 appears more elevated  than previous. Encouraged patient to set up prompt visit with Dr. Lonzo Cloud. Patient may need surgery or radioactive iodine treatment   # Migraines S: 3 per week. Worse with BP higher. BP possibly higher from graves A/P: we discussed getting thyroid situation under better control and if headaches do not improve consider other prophylactic options  #IBS- dicyclomine per GI. Reasonable control.   #Insomnia- groggy next day on hydroxyzine- uses very sparingly   Recommended follow up: 1 month after endocrine visit Future Appointments  Date Time Provider Department Center  05/05/2021  3:30 PM Janalyn Harder, MD [facility]    Lab/Order associations:   ICD-10-CM   1. Essential hypertension  I10 Basic metabolic panel    Basic metabolic panel    CANCELED: Basic metabolic panel  2. Coronary artery disease involving native coronary artery of native heart without angina pectoris  I25.10   3. Palpitations  R00.2   4. Other migraine without status migrainosus, not intractable  G43.809   5. Hyperthyroidism  E05.90 T4, free    T4, free  6. Encounter for hepatitis C screening test for low risk patient  Z11.59 Hepatitis C antibody    Hepatitis C antibody  7. Screen for colon cancer  Z12.11 Cologuard    Meds ordered this encounter  Medications  . ezetimibe (ZETIA) 10 MG tablet    Sig: Take 1 tablet (10 mg total) by mouth daily.    Dispense:  90 tablet    Refill:  3    Time  Spent: 50 minutes of total time (2:43 PM- 3:33 PM) was spent on the date of the encounter performing the following actions: chart review prior to seeing the patient, obtaining history, performing a medically necessary exam, counseling on the treatment plan, placing orders, and documenting in our EHR.   Return precautions advised.  Tana Conch, MD

## 2020-11-07 ENCOUNTER — Telehealth: Payer: Self-pay | Admitting: Family

## 2020-11-07 ENCOUNTER — Ambulatory Visit (INDEPENDENT_AMBULATORY_CARE_PROVIDER_SITE_OTHER): Payer: PRIVATE HEALTH INSURANCE | Admitting: Family Medicine

## 2020-11-07 ENCOUNTER — Encounter: Payer: Self-pay | Admitting: Family Medicine

## 2020-11-07 ENCOUNTER — Other Ambulatory Visit: Payer: Self-pay

## 2020-11-07 VITALS — BP 156/100 | HR 90 | Temp 98.5°F | Ht 67.0 in | Wt 160.5 lb

## 2020-11-07 DIAGNOSIS — Z1211 Encounter for screening for malignant neoplasm of colon: Secondary | ICD-10-CM

## 2020-11-07 DIAGNOSIS — Z1159 Encounter for screening for other viral diseases: Secondary | ICD-10-CM

## 2020-11-07 DIAGNOSIS — I2510 Atherosclerotic heart disease of native coronary artery without angina pectoris: Secondary | ICD-10-CM | POA: Diagnosis not present

## 2020-11-07 DIAGNOSIS — G43809 Other migraine, not intractable, without status migrainosus: Secondary | ICD-10-CM | POA: Diagnosis not present

## 2020-11-07 DIAGNOSIS — R002 Palpitations: Secondary | ICD-10-CM | POA: Diagnosis not present

## 2020-11-07 DIAGNOSIS — I10 Essential (primary) hypertension: Secondary | ICD-10-CM | POA: Diagnosis not present

## 2020-11-07 DIAGNOSIS — E0590 Thyrotoxicosis, unspecified without thyrotoxic crisis or storm: Secondary | ICD-10-CM

## 2020-11-07 LAB — CYTOLOGY - PAP
Comment: NEGATIVE
Diagnosis: NEGATIVE
High risk HPV: NEGATIVE

## 2020-11-07 MED ORDER — EZETIMIBE 10 MG PO TABS: 10.0000 mg | ORAL_TABLET | Freq: Every day | ORAL | 3 refills | Status: DC

## 2020-11-07 NOTE — Telephone Encounter (Signed)
Please advise pt as follows:  Thyroid remains overactive.  It looks like she overdue for a follow up visit with Dr. Lonzo Cloud. Please contact endo to schedule. Cholesterol looks great, sugar is mildly elevated but not in the diabetes range. Please continue your work on healthy diet and regular exercise.

## 2020-11-07 NOTE — Telephone Encounter (Signed)
Patient advised of results and scheduled to see Dr. Lonzo Cloud on Tuesday 5-31

## 2020-11-11 ENCOUNTER — Ambulatory Visit: Payer: PRIVATE HEALTH INSURANCE | Admitting: Medical

## 2020-11-11 ENCOUNTER — Other Ambulatory Visit: Payer: Self-pay | Admitting: Internal Medicine

## 2020-11-11 ENCOUNTER — Encounter: Payer: Self-pay | Admitting: Internal Medicine

## 2020-11-11 LAB — BASIC METABOLIC PANEL
BUN: 16 mg/dL (ref 7–25)
CO2: 26 mmol/L (ref 20–32)
Calcium: 9.2 mg/dL (ref 8.6–10.4)
Chloride: 108 mmol/L (ref 98–110)
Creat: 0.75 mg/dL (ref 0.50–1.05)
Glucose, Bld: 97 mg/dL (ref 65–99)
Potassium: 4.1 mmol/L (ref 3.5–5.3)
Sodium: 144 mmol/L (ref 135–146)

## 2020-11-11 LAB — HEPATITIS C ANTIBODY
Hepatitis C Ab: NONREACTIVE
SIGNAL TO CUT-OFF: 0.01 (ref <1.00)

## 2020-11-11 LAB — T4, FREE: Free T4: 3 ng/dL — ABNORMAL HIGH (ref 0.8–1.8)

## 2020-11-11 MED ORDER — METHIMAZOLE 10 MG PO TABS: 10.0000 mg | ORAL_TABLET | Freq: Three times a day (TID) | ORAL | 2 refills | Status: DC

## 2020-11-13 ENCOUNTER — Other Ambulatory Visit: Payer: Self-pay

## 2020-11-13 ENCOUNTER — Encounter: Payer: Self-pay | Admitting: Internal Medicine

## 2020-11-13 ENCOUNTER — Ambulatory Visit (INDEPENDENT_AMBULATORY_CARE_PROVIDER_SITE_OTHER): Payer: PRIVATE HEALTH INSURANCE | Admitting: Internal Medicine

## 2020-11-13 VITALS — BP 148/98 | HR 96 | Ht 67.0 in | Wt 154.5 lb

## 2020-11-13 DIAGNOSIS — E0590 Thyrotoxicosis, unspecified without thyrotoxic crisis or storm: Secondary | ICD-10-CM

## 2020-11-13 DIAGNOSIS — E0500 Thyrotoxicosis with diffuse goiter without thyrotoxic crisis or storm: Secondary | ICD-10-CM | POA: Diagnosis not present

## 2020-11-13 MED ORDER — METOPROLOL SUCCINATE ER 100 MG PO TB24: 200.0000 mg | ORAL_TABLET | Freq: Every day | ORAL | 3 refills | Status: DC

## 2020-11-13 NOTE — Progress Notes (Signed)
Name: Carla West  MRN/ DOB: [id number], 27-Apr-1961    Age/ Sex: 60 y.o., female     PCP: Shelva Majestic, MD   Reason for Endocrinology Evaluation: Hyperthyroidism     Initial Endocrinology Clinic Visit: 03/04/2020    PATIENT IDENTIFIER: Carla West is a 60 y.o., female with a past medical history of HTN, migraine headaches and Graves' disease. . She has followed with [facility] since 03/04/2020 for consultative assistance with management of her Hyperthyroidism   HISTORICAL SUMMARY:   Pt has been noted with hyperthyroidism since 2019.  She has been on methimazole since 2020, when she was seen by Dr. Elvera Lennox, she has chronic history of compliance issues. She transitioned care to me 02/2020. She was supposed to return in 05/2020 which she never did until her PCP found hyperthyroidism and was asked to contact us in 11/2020  No FH of thyroid disease   SUBJECTIVE:     Today (11/13/2020):  Carla West is here for hyperthyroidism. Chronic history of non-compliance. Has not been here in 8 months.  She lost insurance and was without medications for ~ 2 months   She has been noted with weight loss  She was recently increased her dose to three times a day , but feels sick and is about to have a headaches, she is not sure if these are migraine headaches but they don't feel the same to her   She has noted tremors, palpitations and loose stools         Methimazole 10 mg  1 tab TID    HISTORY:  Past Medical History:  Past Medical History:  Diagnosis Date  . Abnormal Pap smear of cervix 08/03/2015  . Anxiety   . Benign essential HTN 08/03/2015  . Depression   . Graves disease   . History of chicken pox   . Migraine headache 08/03/2015   Past Surgical History:  Past Surgical History:  Procedure Laterality Date  . CORONARY STENT INTERVENTION N/A 02/27/2019   Procedure: CORONARY STENT INTERVENTION;  Surgeon: Kathleene Hazel, MD;  [facility];  Service: Cardiovascular;  Laterality: N/A;  . LEFT HEART CATH AND CORONARY ANGIOGRAPHY N/A 02/27/2019   Procedure: LEFT HEART CATH AND CORONARY ANGIOGRAPHY;  Surgeon: Kathleene Hazel, MD;  [facility];  Service: Cardiovascular;  Laterality: N/A;  . TUBAL LIGATION      Social History:  reports that she has never smoked. She has never used smokeless tobacco. She reports that she does not drink alcohol and does not use drugs. Family History:  Family History  Problem Relation Age of Onset  . Hypertension Mother   . Heart disease Mother        in late 64s  . Hyperlipidemia Father   . Stroke Father        later in life  . Breast cancer Maternal Grandmother   . Hyperlipidemia Sister   . Other Brother        plan crash in [employer]  . Migraines Son   . Heart disease Brother        CHF, arrythmia  . Liver disease Brother        alcohol  . Hypertension Brother   . Gout Brother   . Hypertension Brother      HOME MEDICATIONS: Allergies as of 11/13/2020      Reactions   Elavil [amitriptyline Hcl] Other (See Comments)   Jittery..the patient states she has not  had any side effects       Medication List       Accurate as of November 13, 2020  2:17 PM. If you have any questions, ask your nurse or doctor.        almotriptan 12.5 MG tablet Commonly known as: AXERT 1 tab po at onset of ha. Repeat in 2 hours if needed. Max 2 tabs in 24 hours   amLODipine 10 MG tablet Commonly known as: NORVASC Take 1 tablet (10 mg total) by mouth daily.   aspirin EC 81 MG tablet Take 1 tablet (81 mg total) by mouth daily.   atorvastatin 80 MG tablet Commonly known as: LIPITOR Take 1 tablet (80 mg total) by mouth daily at 6 PM.   dicyclomine 20 MG tablet Commonly known as: BENTYL Take 1 tablet (20 mg total) by mouth 4 (four) times daily -  before meals and at bedtime.   ezetimibe 10 MG tablet Commonly known as: Zetia Take 1 tablet (10 mg total) by mouth  daily.   hydrOXYzine 25 MG tablet Commonly known as: ATARAX/VISTARIL 1 tab po q hs prn insomnia   losartan 50 MG tablet Commonly known as: COZAAR Take 2 tablets (100 mg total) by mouth daily. TAKE 1 TABLET(50 MG) BY MOUTH DAILY   methimazole 10 MG tablet Commonly known as: TAPAZOLE Take 1 tablet (10 mg total) by mouth 3 (three) times daily. Take 10 mg in a.m. and 10 mg in the evening, with meals   metoprolol succinate 100 MG 24 hr tablet Commonly known as: TOPROL-XL Take 2 tablets (200 mg total) by mouth daily. Take with or immediately following a meal. What changed: how much to take Changed by: Scarlette Shorts, MD   promethazine 25 MG tablet Commonly known as: PHENERGAN Take 1 tablet (25 mg total) by mouth every 6 (six) hours as needed for nausea or vomiting.   ticagrelor 90 MG Tabs tablet Commonly known as: BRILINTA Take 1 tablet (90 mg total) by mouth 2 (two) times daily.   triamterene-hydrochlorothiazide 37.5-25 MG tablet Commonly known as: MAXZIDE-25 TAKE 1 TABLET BY MOUTH DAILY         OBJECTIVE:   PHYSICAL EXAM: VS: BP (!) 148/98   Pulse 96   Ht 5\' 7"  (1.702 m)   Wt 154 lb 8 oz (70.1 kg)   SpO2 98%   BMI 24.20 kg/m    EXAM: General: Pt appears well and is in NAD  Eyes: External eye exam normal without stare, lid lag or exophthalmos.  EOM intact.    Neck: General: Supple without adenopathy. Thyroid: Thyroid size normal.  No goiter or nodules appreciated. No thyroid bruit.  Lungs: Clear with good BS bilat with no rales, rhonchi, or wheezes  Heart: Auscultation: RRR.  Abdomen: Normoactive bowel sounds, soft, nontender, without masses or organomegaly palpable  Extremities:  BL LE: No pretibial edema normal ROM and strength.  Mental Status: Judgment, insight: Intact Memory: Intact for recent and remote events Mood and affect: No depression, anxiety, or agitation     DATA REVIEWED:   Results for LACRESHA, FUSILIER (MRN Unknown Foley) as of 11/13/2020  13:55  Ref. Range 11/04/2020 16:03 11/07/2020 15:34  TSH Latest Ref Range: 0.35 - 4.50 uIU/mL <0.01 Repeated and verified X2. (L)   T4,Free(Direct) Latest Ref Range: 0.8 - 1.8 ng/dL  3.0 (H)    ASSESSMENT / PLAN / RECOMMENDATIONS:   1. Hyperthyroidism   - Pt with hyperthyroid symptoms  -Patient with biochemical evidence of hyperthyroid -Historical  issues with intermittent care, as of recent she is lost her insurance and was without methimazole for 2 months but currently has new insurance and is on methimazole, she had recently increased her dose to 3 times daily as prescribed by me -We had a long conversation about alternative therapy with radioactive iodine ablation as well as total thyroidectomy, we did discuss the risk and the benefits of each modality, we also discussed the need for lifelong LT-4 replacement following these modalities.  The patient has opted to remain on methimazole at this time -We did discuss her increased risk for developing cardiac arrhythmias as well as increased bone resorption -I am going to increase her beta-blocker due to tachycardia Medications   Continue methimazole 10 mg 3 times daily Increase metoprolol XL 100 mg to 2 tablets daily   I spent 25 minutes preparing to see the patient by review of recent labs, imaging and procedures, obtaining and reviewing separately obtained history, communicating with the patient/family or caregiver, ordering medications, tests or procedures, and documenting clinical information in the EHR including the differential Dx, treatment, and any further evaluation and other management   Repeat labs in 4 weeks Follow-up in 3 months Signed electronically by: Lyndle Herrlich, MD  [facility]  [facility] [address] [place] [place], [place] [address] Phone: [phone] FAX: [phone]      CC: Shelva Majestic, MD [address] [place] [address] Phone: [phone]  Fax:  [phone]   Return to Endocrinology clinic as below: Future Appointments  Date Time Provider Department Center  05/05/2021  3:30 PM Janalyn Harder, MD [facility]

## 2020-11-13 NOTE — Patient Instructions (Addendum)
-   Continue  methimazole 10 mg, 1 tablet three times a day   - Increase Metoprolol XL  100 mg TWO tablet daily   - Get repeat thyroid labs 4 weeks    It is ESSENTIAL to get follow-up labs to help avoid over or undertreatment of your hyperthyroidism - both of which can be dangerous to your health.

## 2020-11-14 MED ORDER — METHIMAZOLE 10 MG PO TABS: 10.0000 mg | ORAL_TABLET | Freq: Three times a day (TID) | ORAL | 2 refills | Status: DC

## 2020-11-17 ENCOUNTER — Encounter: Payer: Self-pay | Admitting: Family Medicine

## 2020-11-17 ENCOUNTER — Telehealth: Payer: Self-pay | Admitting: Family Medicine

## 2020-11-17 NOTE — Telephone Encounter (Signed)
Awaiting Dr. Pamala Hurry response to [facility] message regarding this medication.

## 2020-11-17 NOTE — Telephone Encounter (Signed)
New pharmacy - patient need refill today.   Patient states she needs a refill   triamterene-hydrochlorothiazide (MAXZIDE-25) 37.5-25 MG tablet [[id number]]  [facility] - [place], [place] - [address] Phone:  [phone]  Fax:  [phone]

## 2020-11-18 ENCOUNTER — Other Ambulatory Visit: Payer: Self-pay

## 2020-11-18 MED ORDER — TRIAMTERENE-HCTZ 37.5-25 MG PO TABS: 1.0000 | ORAL_TABLET | Freq: Every day | ORAL | 0 refills | Status: DC

## 2020-11-18 NOTE — Telephone Encounter (Signed)
Medication refilled

## 2020-11-19 ENCOUNTER — Other Ambulatory Visit: Payer: Self-pay

## 2020-11-19 MED ORDER — TRIAMTERENE-HCTZ 37.5-25 MG PO TABS: 1.0000 | ORAL_TABLET | Freq: Every day | ORAL | 0 refills | Status: DC

## 2020-11-25 ENCOUNTER — Ambulatory Visit: Payer: PRIVATE HEALTH INSURANCE | Admitting: Internal Medicine

## 2020-11-27 ENCOUNTER — Ambulatory Visit
Admission: RE | Admit: 2020-11-27 | Discharge: 2020-11-27 | Disposition: A | Discharge status: Home / Self Care | Payer: PRIVATE HEALTH INSURANCE | Source: Ambulatory Visit | Attending: Internal Medicine | Admitting: Internal Medicine

## 2020-11-27 DIAGNOSIS — E0590 Thyrotoxicosis, unspecified without thyrotoxic crisis or storm: Secondary | ICD-10-CM

## 2020-11-27 LAB — COLOGUARD: Cologuard: NEGATIVE

## 2020-12-04 LAB — COLOGUARD
COLOGUARD: NEGATIVE
Cologuard: NEGATIVE

## 2020-12-09 ENCOUNTER — Other Ambulatory Visit: Payer: Self-pay

## 2020-12-09 ENCOUNTER — Other Ambulatory Visit (INDEPENDENT_AMBULATORY_CARE_PROVIDER_SITE_OTHER): Payer: PRIVATE HEALTH INSURANCE

## 2020-12-09 DIAGNOSIS — E0590 Thyrotoxicosis, unspecified without thyrotoxic crisis or storm: Secondary | ICD-10-CM

## 2020-12-09 LAB — COMPREHENSIVE METABOLIC PANEL
ALT: 15 U/L (ref 0–35)
AST: 17 U/L (ref 0–37)
Albumin: 4.3 g/dL (ref 3.5–5.2)
Alkaline Phosphatase: 118 U/L — ABNORMAL HIGH (ref 39–117)
BUN: 22 mg/dL (ref 6–23)
CO2: 26 mEq/L (ref 19–32)
Calcium: 9.4 mg/dL (ref 8.4–10.5)
Chloride: 102 mEq/L (ref 96–112)
Creatinine, Ser: 0.95 mg/dL (ref 0.40–1.20)
GFR: 66.34 mL/min (ref >60.00)
Glucose, Bld: 91 mg/dL (ref 70–99)
Potassium: 3.3 mEq/L — ABNORMAL LOW (ref 3.5–5.1)
Sodium: 138 mEq/L (ref 135–145)
Total Bilirubin: 0.4 mg/dL (ref 0.2–1.2)
Total Protein: 7.5 g/dL (ref 6.0–8.3)

## 2020-12-09 LAB — CBC WITH DIFFERENTIAL/PLATELET
Basophils Absolute: 0 10*3/uL (ref 0.0–0.1)
Basophils Relative: 1 % (ref 0.0–3.0)
Eosinophils Absolute: 0.1 10*3/uL (ref 0.0–0.7)
Eosinophils Relative: 2.8 % (ref 0.0–5.0)
HCT: 38.1 % (ref 36.0–46.0)
Hemoglobin: 12.8 g/dL (ref 12.0–15.0)
Lymphocytes Relative: 42.7 % (ref 12.0–46.0)
Lymphs Abs: 1.7 10*3/uL (ref 0.7–4.0)
MCHC: 33.7 g/dL (ref 30.0–36.0)
MCV: 81 fl (ref 78.0–100.0)
Monocytes Absolute: 0.7 10*3/uL (ref 0.1–1.0)
Monocytes Relative: 16.6 % — ABNORMAL HIGH (ref 3.0–12.0)
Neutro Abs: 1.5 10*3/uL (ref 1.4–7.7)
Neutrophils Relative %: 36.9 % — ABNORMAL LOW (ref 43.0–77.0)
Platelets: 249 10*3/uL (ref 150.0–400.0)
RBC: 4.7 Mil/uL (ref 3.87–5.11)
RDW: 14.2 % (ref 11.5–15.5)
WBC: 3.9 10*3/uL — ABNORMAL LOW (ref 4.0–10.5)

## 2020-12-09 LAB — T4, FREE: Free T4: 1.03 ng/dL (ref 0.60–1.60)

## 2020-12-09 LAB — TSH: TSH: <0.01 u[IU]/mL — ABNORMAL LOW (ref 0.35–4.50)

## 2020-12-11 ENCOUNTER — Encounter: Payer: Self-pay | Admitting: Family Medicine

## 2020-12-11 ENCOUNTER — Other Ambulatory Visit: Payer: Self-pay | Admitting: Internal Medicine

## 2020-12-11 DIAGNOSIS — E0500 Thyrotoxicosis with diffuse goiter without thyrotoxic crisis or storm: Secondary | ICD-10-CM

## 2020-12-22 NOTE — Progress Notes (Signed)
   Subjective:    Patient ID: Carla West, female    DOB: 1960-07-26, 60 y.o.   MRN: [id number]  HPI  No charge. Not sure why on desk top.  Review of Systems     Objective:   Physical Exam        Assessment & Plan:

## 2020-12-25 ENCOUNTER — Encounter: Payer: Self-pay | Admitting: Family Medicine

## 2020-12-25 NOTE — Telephone Encounter (Signed)
Carla West, I have the results I printed them from the portal. I will abstract and send pt a copy.

## 2021-01-08 ENCOUNTER — Encounter (HOSPITAL_COMMUNITY): Payer: Self-pay | Admitting: *Deleted

## 2021-01-08 ENCOUNTER — Ambulatory Visit (HOSPITAL_COMMUNITY)
Admission: EM | Admit: 2021-01-08 | Discharge: 2021-01-08 | Disposition: A | Discharge status: Home / Self Care | Payer: PRIVATE HEALTH INSURANCE | Attending: Emergency Medicine | Admitting: Emergency Medicine

## 2021-01-08 ENCOUNTER — Other Ambulatory Visit: Payer: Self-pay

## 2021-01-08 DIAGNOSIS — J40 Bronchitis, not specified as acute or chronic: Secondary | ICD-10-CM

## 2021-01-08 MED ORDER — ALBUTEROL SULFATE HFA 108 (90 BASE) MCG/ACT IN AERS
INHALATION_SPRAY | RESPIRATORY_TRACT | Status: AC
  Filled 2021-01-08: qty 6.7

## 2021-01-08 MED ORDER — ALBUTEROL SULFATE HFA 108 (90 BASE) MCG/ACT IN AERS: 1.0000 | INHALATION_SPRAY | Freq: Four times a day (QID) | RESPIRATORY_TRACT | 0 refills | Status: DC | PRN

## 2021-01-08 MED ORDER — PROMETHAZINE-DM 6.25-15 MG/5ML PO SYRP: 5.0000 mL | ORAL_SOLUTION | Freq: Four times a day (QID) | ORAL | 0 refills | Status: DC | PRN

## 2021-01-08 MED ORDER — BENZONATATE 100 MG PO CAPS: 200.0000 mg | ORAL_CAPSULE | Freq: Three times a day (TID) | ORAL | 0 refills | Status: DC | PRN

## 2021-01-08 MED ORDER — ALBUTEROL SULFATE HFA 108 (90 BASE) MCG/ACT IN AERS
2.0000 | INHALATION_SPRAY | Freq: Once | RESPIRATORY_TRACT | Status: AC
  Administered 2021-01-08: 2 via RESPIRATORY_TRACT

## 2021-01-08 NOTE — ED Provider Notes (Signed)
[facility]    CSN: [id number] Arrival date & time: 01/08/21  1633      History   Chief Complaint Chief Complaint  Patient presents with   Cough   Shortness of Breath    HPI Unknown Carla West is a 60 y.o. female.   Patient here for evaluation of cough that has been ongoing for the past several days.  Reports cough is nonproductive.  Reports taking multiple at home COVID test that have all been negative.  Denies any fevers.  Denies any history of asthma but does report some bronchitis after having COVID.  Reports some chest discomfort related to coughing.  Denies any recent sick contacts.  Denies any trauma, injury, or other precipitating event.  Denies any specific alleviating or aggravating factors.  Denies any fevers, chest pain, N/V/D, numbness, tingling, weakness, abdominal pain, or headaches.     The history is provided by the patient.  Cough Associated symptoms: shortness of breath   Associated symptoms: no chest pain and no fever   Shortness of Breath Associated symptoms: cough   Associated symptoms: no chest pain and no fever    Past Medical History:  Diagnosis Date   Abnormal Pap smear of cervix 08/03/2015   Anxiety    Benign essential HTN 08/03/2015   Depression    Graves disease    History of chicken pox    Migraine headache 08/03/2015    Patient Active Problem List   Diagnosis Date Noted   CAD (coronary artery disease) 11/07/2020   Skin lesion 11/05/2020   Hyperthyroidism 02/26/2019   Palpitations 04/11/2018   Hyperlipidemia 12/28/2017   Essential hypertension 08/03/2015   Migraine headache 08/03/2015   Abnormal Pap smear of cervix 08/03/2015    Past Surgical History:  Procedure Laterality Date   CORONARY STENT INTERVENTION N/A 02/27/2019   Procedure: CORONARY STENT INTERVENTION;  Surgeon: Kathleene HazelMcAlhany, Christopher D, MD;  [facility];  Service: Cardiovascular;  Laterality: N/A;   LEFT HEART CATH AND CORONARY ANGIOGRAPHY N/A  02/27/2019   Procedure: LEFT HEART CATH AND CORONARY ANGIOGRAPHY;  Surgeon: Kathleene HazelMcAlhany, Christopher D, MD;  [facility];  Service: Cardiovascular;  Laterality: N/A;   TUBAL LIGATION      OB History   No obstetric history on file.      Home Medications    Prior to Admission medications   Medication Sig Start Date End Date Taking? Authorizing Provider  albuterol (VENTOLIN HFA) 108 (90 Base) MCG/ACT inhaler Inhale 1-2 puffs into the lungs every 6 (six) hours as needed for wheezing or shortness of breath. 01/08/21  Yes Ivette LoyalSmith, Bathsheba Durrett R, NP  almotriptan (AXERT) 12.5 MG tablet 1 tab po at onset of ha. Repeat in 2 hours if needed. Max 2 tabs in 24 hours 11/03/20  Yes Saguier, Ramon DredgeEdward, PA-C  amLODipine (NORVASC) 10 MG tablet Take 1 tablet (10 mg total) by mouth daily. 11/04/20  Yes Saguier, Kateri Mcdward, PA-C  aspirin EC 81 MG tablet Take 1 tablet (81 mg total) by mouth daily. 01/29/19  Yes Georgeanna LeaKrasowski, Robert J, MD  atorvastatin (LIPITOR) 80 MG tablet Take 1 tablet (80 mg total) by mouth daily at 6 PM. 11/03/20  Yes Saguier, Ramon DredgeEdward, PA-C  benzonatate (TESSALON PERLES) 100 MG capsule Take 2 capsules (200 mg total) by mouth 3 (three) times daily as needed for cough. 01/08/21  Yes Ivette LoyalSmith, Eliese Kerwood R, NP  dicyclomine (BENTYL) 20 MG tablet Take 1 tablet (20 mg total) by mouth 4 (four) times daily -  before meals and at bedtime. 11/03/20  Yes Saguier, Ramon Dredge, PA-C  ezetimibe (ZETIA) 10 MG tablet Take 1 tablet (10 mg total) by mouth daily. 11/07/20  Yes Shelva Majestic, MD  hydrOXYzine (ATARAX/VISTARIL) 25 MG tablet 1 tab po q hs prn insomnia 11/03/20  Yes Saguier, Ramon Dredge, PA-C  losartan (COZAAR) 50 MG tablet Take 2 tablets (100 mg total) by mouth daily. TAKE 1 TABLET(50 MG) BY MOUTH DAILY 11/04/20  Yes Sandford Craze, NP  methimazole (TAPAZOLE) 10 MG tablet Take 1 tablet (10 mg total) by mouth 3 (three) times daily. Take 10 mg in a.m. and 10 mg in the evening, with meals 11/14/20  Yes Shamleffer, Konrad Dolores, MD  metoprolol succinate (TOPROL-XL) 100 MG 24 hr tablet Take 2 tablets (200 mg total) by mouth daily. Take with or immediately following a meal. 11/13/20  Yes Shamleffer, Konrad Dolores, MD  promethazine-dextromethorphan (PROMETHAZINE-DM) 6.25-15 MG/5ML syrup Take 5 mLs by mouth 4 (four) times daily as needed for cough. 01/08/21  Yes Ivette Loyal, NP  ticagrelor (BRILINTA) 90 MG TABS tablet Take 1 tablet (90 mg total) by mouth 2 (two) times daily. 11/03/20  Yes Saguier, Ramon Dredge, PA-C  triamterene-hydrochlorothiazide (MAXZIDE-25) 37.5-25 MG tablet Take 1 tablet by mouth daily. 11/19/20 02/17/21 Yes Shelva Majestic, MD  promethazine (PHENERGAN) 25 MG tablet Take 1 tablet (25 mg total) by mouth every 6 (six) hours as needed for nausea or vomiting. 06/26/19   Wierzba, [place] N, PA-C    Family History Family History  Problem Relation Age of Onset   Hypertension Mother    Heart disease Mother        in late 86s   Hyperlipidemia Father    Stroke Father        later in life   Breast cancer Maternal Grandmother    Hyperlipidemia Sister    Other Brother        plan crash in [employer]   Migraines Son    Heart disease Brother        CHF, arrythmia   Liver disease Brother        alcohol   Hypertension Brother    Gout Brother    Hypertension Brother     Social History Social History   Tobacco Use   Smoking status: Never   Smokeless tobacco: Never  Vaping Use   Vaping Use: Never used  Substance Use Topics   Alcohol use: No   Drug use: No     Allergies   Elavil [amitriptyline hcl]   Review of Systems Review of Systems  Constitutional:  Negative for fever.  Respiratory:  Positive for cough, chest tightness and shortness of breath.   Cardiovascular:  Negative for chest pain and palpitations.  All other systems reviewed and are negative.   Physical Exam Triage Vital Signs ED Triage Vitals  Enc Vitals Group     BP 01/08/21 1713 (!) 143/92     Pulse --      Resp  01/08/21 1713 20     Temp 01/08/21 1713 99.1 F (37.3 C)     Temp Source 01/08/21 1713 Oral     SpO2 --      Weight --      Height --      Head Circumference --      Peak Flow --      Pain Score 01/08/21 1714 0     Pain Loc --      Pain Edu? --  Excl. in GC? --    No data found.  Updated Vital Signs BP (!) 143/92   Temp 99.1 F (37.3 C) (Oral)   Resp 20   Visual Acuity Right Eye Distance:   Left Eye Distance:   Bilateral Distance:    Right Eye Near:   Left Eye Near:    Bilateral Near:     Physical Exam Vitals and nursing note reviewed.  Constitutional:      General: She is not in acute distress.    Appearance: Normal appearance. She is not ill-appearing, toxic-appearing or diaphoretic.  HENT:     Head: Normocephalic and atraumatic.  Eyes:     Conjunctiva/sclera: Conjunctivae normal.  Cardiovascular:     Rate and Rhythm: Normal rate.     Pulses: Normal pulses.     Heart sounds: Normal heart sounds.  Pulmonary:     Effort: Pulmonary effort is normal.     Breath sounds: Normal breath sounds. No decreased breath sounds, wheezing, rhonchi or rales.  Chest:     Chest wall: No mass, deformity, tenderness, crepitus or edema. There is no dullness to percussion.  Abdominal:     General: Abdomen is flat.  Musculoskeletal:        General: Normal range of motion.     Cervical back: Normal range of motion.  Skin:    General: Skin is warm and dry.  Neurological:     General: No focal deficit present.     Mental Status: She is alert and oriented to person, place, and time.  Psychiatric:        Mood and Affect: Mood normal.     UC Treatments / Results  Labs (all labs ordered are listed, but only abnormal results are displayed) Labs Reviewed - No data to display  EKG   Radiology No results found.  Procedures Procedures (including critical care time)  Medications Ordered in UC Medications  albuterol (VENTOLIN HFA) 108 (90 Base) MCG/ACT inhaler 2 puff  (2 puffs Inhalation Given 01/08/21 1733)    Initial Impression / Assessment and Plan / UC Course  I have reviewed the triage vital signs and the nursing notes.  Pertinent labs & imaging results that were available during my care of the patient were reviewed by me and considered in my medical decision making (see chart for details).    Assessment negative for red flags or concerns.  Likely viral bronchitis.  Will treat with an albuterol inhaler, Tessalon Perles, and Promethazine DM as needed.  Instructed to not take Promethazine DM prior to driving as it can cause drowsiness.  Discussed conservative symptom management as described in discharge instructions.  Follow-up as needed. Final Clinical Impressions(s) / UC Diagnoses   Final diagnoses:  Bronchitis     Discharge Instructions      Use the albuterol inhaler 1-2 puffs every 6 hours as needed for cough, wheezing, and shortness of breath.  You can take the Tessalon perles as needed for cough.  You can also take the Promethazine DM as needed for cough at night.  Promethazine DM can make you sleepy so do not take it prior to driving.   You can take Tylenol and/or Ibuprofen as needed for fever reduction and pain relief.   For cough: honey 1/2 to 1 teaspoon (you can dilute the honey in water or another fluid).  You can also use guaifenesin and dextromethorphan for cough. You can use a humidifier for chest congestion and cough.  If you don't have a  humidifier, you can sit in the bathroom with the hot shower running.     For sore throat: try warm salt water gargles, cepacol lozenges, throat spray, warm tea or water with lemon/honey, popsicles or ice, or OTC cold relief medicine for throat discomfort.    For congestion: take a daily anti-histamine like Zyrtec, Claritin, and a oral decongestant, such as pseudoephedrine.  You can also use Flonase 1-2 sprays in each nostril daily.    It is important to stay hydrated: drink plenty of fluids (water,  gatorade/powerade/pedialyte, juices, or teas) to keep your throat moisturized and help further relieve irritation/discomfort.   Return or go to the Emergency Department if symptoms worsen or do not improve in the next few days.      ED Prescriptions     Medication Sig Dispense Auth. Provider   promethazine-dextromethorphan (PROMETHAZINE-DM) 6.25-15 MG/5ML syrup Take 5 mLs by mouth 4 (four) times daily as needed for cough. 118 mL Ivette Loyal, NP   benzonatate (TESSALON PERLES) 100 MG capsule Take 2 capsules (200 mg total) by mouth 3 (three) times daily as needed for cough. 20 capsule Ivette Loyal, NP   albuterol (VENTOLIN HFA) 108 (90 Base) MCG/ACT inhaler Inhale 1-2 puffs into the lungs every 6 (six) hours as needed for wheezing or shortness of breath. 18.2 g Ivette Loyal, NP      PDMP not reviewed this encounter.   Ivette Loyal, NP 01/08/21 [phone]

## 2021-01-08 NOTE — ED Triage Notes (Signed)
C/O cough with dyspnea and chest discomfort onset 2 days ago.  Denies fevers.  States had negative Covid test yesterday and today.

## 2021-01-08 NOTE — Discharge Instructions (Addendum)
Use the albuterol inhaler 1-2 puffs every 6 hours as needed for cough, wheezing, and shortness of breath.  You can take the Tessalon perles as needed for cough.  You can also take the Promethazine DM as needed for cough at night.  Promethazine DM can make you sleepy so do not take it prior to driving.   You can take Tylenol and/or Ibuprofen as needed for fever reduction and pain relief.   For cough: honey 1/2 to 1 teaspoon (you can dilute the honey in water or another fluid).  You can also use guaifenesin and dextromethorphan for cough. You can use a humidifier for chest congestion and cough.  If you don't have a humidifier, you can sit in the bathroom with the hot shower running.     For sore throat: try warm salt water gargles, cepacol lozenges, throat spray, warm tea or water with lemon/honey, popsicles or ice, or OTC cold relief medicine for throat discomfort.    For congestion: take a daily anti-histamine like Zyrtec, Claritin, and a oral decongestant, such as pseudoephedrine.  You can also use Flonase 1-2 sprays in each nostril daily.    It is important to stay hydrated: drink plenty of fluids (water, gatorade/powerade/pedialyte, juices, or teas) to keep your throat moisturized and help further relieve irritation/discomfort.   Return or go to the Emergency Department if symptoms worsen or do not improve in the next few days.

## 2021-03-04 ENCOUNTER — Ambulatory Visit: Payer: PRIVATE HEALTH INSURANCE | Admitting: Internal Medicine

## 2021-03-11 ENCOUNTER — Ambulatory Visit: Payer: PRIVATE HEALTH INSURANCE | Admitting: Internal Medicine

## 2021-03-11 NOTE — Progress Notes (Deleted)
Name: Carla West  MRN/ DOB: [id number], [birth year]    Age/ Sex: 60 y.o., female     PCP: Shelva Majestic, MD   Reason for Endocrinology Evaluation: Hyperthyroidism     Initial Endocrinology Clinic Visit: 03/04/2020    PATIENT IDENTIFIER: Carla West is a 60 y.o., female with a past medical history of HTN, migraine headaches and Graves' disease. . She has followed with [facility] since 03/04/2020 for consultative assistance with management of her Hyperthyroidism   HISTORICAL SUMMARY:   Pt has been noted with hyperthyroidism since 2019.  She has been on methimazole since 2020, when she was seen by Dr. Elvera Lennox, she has chronic history of compliance issues. She transitioned care to me 02/2020. She was supposed to return in 05/2020 which she never did until her PCP found hyperthyroidism and was asked to contact us in 11/2020  No FH of thyroid disease    SUBJECTIVE:     Today (03/11/2021):  Carla West is here for hyperthyroidism.    methimazole 10 mg 3 times daily metoprolol XL 100 mg to 2 tablets daily     HISTORY:  Past Medical History:  Past Medical History:  Diagnosis Date   Abnormal Pap smear of cervix 08/03/2015   Anxiety    Benign essential HTN 08/03/2015   Depression    Graves disease    History of chicken pox    Migraine headache 08/03/2015   Past Surgical History:  Past Surgical History:  Procedure Laterality Date   CORONARY STENT INTERVENTION N/A 02/27/2019   Procedure: CORONARY STENT INTERVENTION;  Surgeon: Kathleene Hazel, MD;  [facility];  Service: Cardiovascular;  Laterality: N/A;   LEFT HEART CATH AND CORONARY ANGIOGRAPHY N/A 02/27/2019   Procedure: LEFT HEART CATH AND CORONARY ANGIOGRAPHY;  Surgeon: Kathleene Hazel, MD;  [facility];  Service: Cardiovascular;  Laterality: N/A;   TUBAL LIGATION     Social History:  reports that she has never smoked. She has never used  smokeless tobacco. She reports that she does not drink alcohol and does not use drugs. Family History:  Family History  Problem Relation Age of Onset   Hypertension Mother    Heart disease Mother        in late 66s   Hyperlipidemia Father    Stroke Father        later in life   Breast cancer Maternal Grandmother    Hyperlipidemia Sister    Other Brother        plan crash in [employer]   Migraines Son    Heart disease Brother        CHF, arrythmia   Liver disease Brother        alcohol   Hypertension Brother    Gout Brother    Hypertension Brother      HOME MEDICATIONS: Allergies as of 03/11/2021       Reactions   Elavil [amitriptyline Hcl] Other (See Comments)   Jittery..the patient states she has not had any side effects         Medication List        Accurate as of March 11, 2021 12:55 PM. If you have any questions, ask your nurse or doctor.          albuterol 108 (90 Base) MCG/ACT inhaler Commonly known as: VENTOLIN HFA Inhale 1-2 puffs into the lungs every 6 (six) hours as needed for wheezing or shortness of breath.  almotriptan 12.5 MG tablet Commonly known as: AXERT 1 tab po at onset of ha. Repeat in 2 hours if needed. Max 2 tabs in 24 hours   amLODipine 10 MG tablet Commonly known as: NORVASC Take 1 tablet (10 mg total) by mouth daily.   aspirin EC 81 MG tablet Take 1 tablet (81 mg total) by mouth daily.   atorvastatin 80 MG tablet Commonly known as: LIPITOR Take 1 tablet (80 mg total) by mouth daily at 6 PM.   benzonatate 100 MG capsule Commonly known as: Tessalon Perles Take 2 capsules (200 mg total) by mouth 3 (three) times daily as needed for cough.   dicyclomine 20 MG tablet Commonly known as: BENTYL Take 1 tablet (20 mg total) by mouth 4 (four) times daily -  before meals and at bedtime.   ezetimibe 10 MG tablet Commonly known as: Zetia Take 1 tablet (10 mg total) by mouth daily.   hydrOXYzine 25 MG tablet Commonly known as:  ATARAX/VISTARIL 1 tab po q hs prn insomnia   losartan 50 MG tablet Commonly known as: COZAAR Take 2 tablets (100 mg total) by mouth daily. TAKE 1 TABLET(50 MG) BY MOUTH DAILY   methimazole 10 MG tablet Commonly known as: TAPAZOLE Take 1 tablet (10 mg total) by mouth 3 (three) times daily. Take 10 mg in a.m. and 10 mg in the evening, with meals   metoprolol succinate 100 MG 24 hr tablet Commonly known as: TOPROL-XL Take 2 tablets (200 mg total) by mouth daily. Take with or immediately following a meal.   promethazine 25 MG tablet Commonly known as: PHENERGAN Take 1 tablet (25 mg total) by mouth every 6 (six) hours as needed for nausea or vomiting.   promethazine-dextromethorphan 6.25-15 MG/5ML syrup Commonly known as: PROMETHAZINE-DM Take 5 mLs by mouth 4 (four) times daily as needed for cough.   ticagrelor 90 MG Tabs tablet Commonly known as: BRILINTA Take 1 tablet (90 mg total) by mouth 2 (two) times daily.   triamterene-hydrochlorothiazide 37.5-25 MG tablet Commonly known as: MAXZIDE-25 Take 1 tablet by mouth daily.          OBJECTIVE:   PHYSICAL EXAM: VS: There were no vitals taken for this visit.   EXAM: General: Pt appears well and is in NAD  Eyes: External eye exam normal without stare, lid lag or exophthalmos.  EOM intact.    Neck: General: Supple without adenopathy. Thyroid: Thyroid size normal.  No goiter or nodules appreciated. No thyroid bruit.  Lungs: Clear with good BS bilat with no rales, rhonchi, or wheezes  Heart: Auscultation: RRR.  Abdomen: Normoactive bowel sounds, soft, nontender, without masses or organomegaly palpable  Extremities:  BL LE: No pretibial edema normal ROM and strength.  Mental Status: Judgment, insight: Intact Memory: Intact for recent and remote events Mood and affect: No depression, anxiety, or agitation     DATA REVIEWED:   Results for Carla West, Carla West (MRN [id number]) as of 11/13/2020 13:55  Ref. Range 11/04/2020 16:03  11/07/2020 15:34  TSH Latest Ref Range: 0.35 - 4.50 uIU/mL <0.01 Repeated and verified X2. (L)   T4,Free(Direct) Latest Ref Range: 0.8 - 1.8 ng/dL  3.0 (H)    ASSESSMENT / PLAN / RECOMMENDATIONS:   Hyperthyroidism   - Pt with hyperthyroid symptoms  -Patient with biochemical evidence of hyperthyroid -   Medications   Continue methimazole 10 mg 3 times daily Increase metoprolol XL 100 mg to 2 tablets daily    Repeat labs in 4 weeks Follow-up in  3 months Signed electronically by: Lyndle Herrlich, MD  [facility]  [facility] [address] [place], [place] [address] Phone: [phone] FAX: [phone]      CC: Shelva Majestic, MD [address] [place] [address] Phone: [phone]  Fax: [phone]   Return to Endocrinology clinic as below: Future Appointments  Date Time Provider Department Center  03/11/2021  3:40 PM Kaiel Weide, Konrad Dolores, MD [facility] None  04/15/2021 10:00 AM Janalyn Harder, MD [facility]

## 2021-04-15 ENCOUNTER — Ambulatory Visit: Payer: Self-pay | Admitting: Dermatology

## 2021-05-05 ENCOUNTER — Ambulatory Visit: Payer: PRIVATE HEALTH INSURANCE | Admitting: Dermatology

## 2021-06-03 ENCOUNTER — Telehealth: Payer: Self-pay

## 2021-06-03 DIAGNOSIS — I10 Essential (primary) hypertension: Secondary | ICD-10-CM

## 2021-06-03 NOTE — Telephone Encounter (Signed)
..   Encourage patient to contact the pharmacy for refills or they can request refills through [facility]  LAST APPOINTMENT DATE:  11/11/2020  NEXT APPOINTMENT DATE:  MEDICATION:amlodipine, triamterene-hydrochlorothiazide, losartan and ezetimibe  Is the patient out of medication?   PHARMACY: [facility] [address]  Let patient know to contact pharmacy at the end of the day to make sure medication is ready.  Please notify patient to allow 48-72 hours to process  CLINICAL FILLS OUT ALL BELOW:   LAST REFILL:  QTY:  REFILL DATE:    OTHER COMMENTS:    Okay for refill?  Please advise

## 2021-06-04 ENCOUNTER — Telehealth: Payer: Self-pay | Admitting: *Deleted

## 2021-06-04 MED ORDER — EZETIMIBE 10 MG PO TABS: 10.0000 mg | ORAL_TABLET | Freq: Every day | ORAL | 3 refills | Status: DC

## 2021-06-04 MED ORDER — AMLODIPINE BESYLATE 10 MG PO TABS: 10.0000 mg | ORAL_TABLET | Freq: Every day | ORAL | 3 refills | Status: DC

## 2021-06-04 MED ORDER — TRIAMTERENE-HCTZ 37.5-25 MG PO TABS: 1.0000 | ORAL_TABLET | Freq: Every day | ORAL | 3 refills | Status: DC

## 2021-06-04 MED ORDER — LOSARTAN POTASSIUM 100 MG PO TABS: 100.0000 mg | ORAL_TABLET | Freq: Every day | ORAL | 3 refills | Status: DC

## 2021-06-04 MED ORDER — LOSARTAN POTASSIUM 50 MG PO TABS: 100.0000 mg | ORAL_TABLET | Freq: Every day | ORAL | 3 refills | Status: DC

## 2021-06-04 NOTE — Telephone Encounter (Signed)
Left message on voicemail to call office. She needs a follow up per Dr. Durene Cal. Rx was sent to pharmacy for Losartan.

## 2021-06-04 NOTE — Telephone Encounter (Signed)
Alex from [facility] called to clarify Rx for Losartan has two different directions. Told him I will have to check with provider and get back to you. Alex verbalized understanding.

## 2021-06-04 NOTE — Telephone Encounter (Signed)
Dr. Durene Cal please clarify directions for Losartan for pt there are two different directions. Thanks

## 2021-06-04 NOTE — Telephone Encounter (Signed)
Spoke to pt, clarified pharmacy told her sending Rx's now. Pt verbalized understanding.

## 2021-06-09 NOTE — Telephone Encounter (Signed)
Spoke to pt told her Rx for Losartan was sent to pharmacy last week and Dr. Durene Cal would like you to schedule an appt. Pt verbalized understanding. Appt scheduled for 07/03/2021 at 4:00 PM.

## 2021-06-26 NOTE — Progress Notes (Signed)
Phone [phone] In person visit   Subjective:   Carla West is a 61 y.o. year old very pleasant female patient who presents for/with See problem oriented charting Chief Complaint  Patient presents with   Follow-up   Hypertension   Hypothyroidism   Hyperglycemia   Insomnia    Pt states she is still not sleeping which is causing her to be to tired to exercise.     This visit occurred during the SARS-CoV-2 public health emergency.  Safety protocols were in place, including screening questions prior to the visit, additional usage of staff PPE, and extensive cleaning of exam room while observing appropriate contact time as indicated for disinfecting solutions.   Past Medical History-  Patient Active Problem List   Diagnosis Date Noted   CAD (coronary artery disease) 11/07/2020    Priority: High   Hyperthyroidism 02/26/2019    Priority: High   Essential hypertension 08/03/2015    Priority: High   Palpitations 04/11/2018    Priority: Medium    Hyperlipidemia 12/28/2017    Priority: Medium    Migraine headache 08/03/2015    Priority: Medium    Skin lesion 11/05/2020    Priority: Low   Abnormal Pap smear of cervix 08/03/2015    Priority: Low    Medications- reviewed and updated Current Outpatient Medications  Medication Sig Dispense Refill   amLODipine (NORVASC) 5 MG tablet Take 1 tablet (5 mg total) by mouth daily. 90 tablet 3   metoprolol succinate (TOPROL-XL) 50 MG 24 hr tablet Take 1 tablet (50 mg total) by mouth daily. Take with or immediately following a meal. 90 tablet 3   albuterol (VENTOLIN HFA) 108 (90 Base) MCG/ACT inhaler Inhale 1-2 puffs into the lungs every 6 (six) hours as needed for wheezing or shortness of breath. 18.2 g 0   almotriptan (AXERT) 12.5 MG tablet 1 tab po at onset of ha. Repeat in 2 hours if needed. Max 2 tabs in 24 hours 30 tablet 1   aspirin EC 81 MG tablet Take 1 tablet (81 mg total) by mouth daily. 90 tablet 3   atorvastatin (LIPITOR)  80 MG tablet Take 1 tablet (80 mg total) by mouth daily at 6 PM. 90 tablet 3   ezetimibe (ZETIA) 10 MG tablet Take 1 tablet (10 mg total) by mouth daily. 90 tablet 3   hydrOXYzine (ATARAX/VISTARIL) 25 MG tablet 1 tab po q hs prn insomnia 90 tablet 1   losartan (COZAAR) 100 MG tablet Take 1 tablet (100 mg total) by mouth daily. 90 tablet 3   methimazole (TAPAZOLE) 10 MG tablet Take 1 tablet (10 mg total) by mouth 3 (three) times daily. Take 10 mg in a.m. and 10 mg in the evening, with meals 90 tablet 2   promethazine (PHENERGAN) 25 MG tablet Take 1 tablet (25 mg total) by mouth every 6 (six) hours as needed for nausea or vomiting. 30 tablet 0   ticagrelor (BRILINTA) 90 MG TABS tablet Take 1 tablet (90 mg total) by mouth 2 (two) times daily. 180 tablet 0   triamterene-hydrochlorothiazide (MAXZIDE-25) 37.5-25 MG tablet Take 1 tablet by mouth daily. 90 tablet 3   No current facility-administered medications for this visit.     Objective:  BP 100/62    Pulse 75    Temp (!) 97.4 F (36.3 C)    Ht 5\' 7"  (1.702 m)    Wt 162 lb 9.6 oz (73.8 kg)    SpO2 97%    BMI 25.47 kg/m  Gen: NAD, resting comfortably Mild thyromegaly CV: RRR no murmurs rubs or gallops Lungs: CTAB no crackles, wheeze, rhonchi Ext: no edema Skin: warm, dry    Assessment and Plan   #CAD with history of stent 02/27/2019 At time of active graves disease #hyperlipidemia-LDL goal under 70 S: Medication: brilinta 90 mg twice daily, aspirin 81mg  daily, atorvastatin 80mg  daily, zetia 10 mg daily added 11/07/2020- so hoping LDL lower  No chest pain or shortness of breath Lab Results  Component Value Date   CHOL 175 11/04/2020   HDL 67.30 11/04/2020   LDLCALC 93 11/04/2020   TRIG 74.0 11/04/2020   CHOLHDL 3 11/04/2020   A/P: Cholesterol poorly controlled on last check but added zetia-update direct LDL with labs today  #Resistant hypertension- hyperthyroidism possibly contributing S: medication: amlodipine 10 mg PM, losartan  100mg  AM, triamterene-hctz 37.5-25mg  PM -actually off metoprolol for 30 days- she called for refill but it was not transitioned over- had been on 200mg  XR Home readings #s:  120/80 at home typically BP Readings from Last 3 Encounters:  07/03/21 100/62  01/08/21 (!) 143/92  11/13/20 (!) 148/98  A/P: Well-controlled today-even without metoprolol.I am going to add a low dose of metoprolol 50mg  XR back to her regimen and reduce amlodipine to 5 mg.   #hyperthyroidism/graves disease-follows with Dr. Lonzo CloudShamleffer S: compliant On thyroid medication-methimazole 10mg  twice daily  - RAI or surgery in active consideration - she prefers to remain on meds over these 2 options -may be cause of high BP - I am hopeful better BP is related to better control of hyperthyroidism -Has been difficult to get patient to follow-up with Dr. Lonzo CloudShamleffer  Lab Results  Component Value Date   TSH <0.01 (L) 12/09/2020  -She was off of methimazole at time of last check but had recently restarted A/P:hopefully improved- update tsh and t4   # Migraines S: 3 per week previously- now last 2 months ago. Worsened with BP higher. BP possibly higher from graves - doing better as above A/P: migraines much improved- thrilled she is doing so well . Can continue almotriptan as needed  # Hyperglycemia/insulin resistance/prediabetes- peak a1c of 5.9 S: Medication: none Exercise and diet- walking some around her facility.  Lab Results  Component Value Date   HGBA1C 5.9 (H) 02/13/2020   HGBA1C 5.5 02/27/2019   A/P: hopefully stable on a1c- update labs today  #IBS- dicyclomine per GI in past- feeling better and hasnt needed-requests removal from meds  # Insomnia S: groggy next day on hydroxyzine 25 mg- uses very sparingly  A/P: doing well- im willing to refill as needed - can take half tablet  Recommended follow up: Return in about 6 months (around 12/31/2021) for physical or sooner if needed.  Lab/Order associations:    ICD-10-CM   1. Hyperthyroidism  E05.90 TSH    T4, free    T4, free    TSH    2. Hyperlipidemia, unspecified hyperlipidemia type  E78.5 CBC with Differential/Platelet    Comprehensive metabolic panel    LDL cholesterol, direct    Comprehensive metabolic panel    CBC with Differential/Platelet    LDL cholesterol, direct    3. Resistant hypertension  I10     4. Coronary artery disease involving native coronary artery of native heart without angina pectoris  I25.10     5. Hyperglycemia  R73.9 Hemoglobin A1c    Hemoglobin A1c    6. Postmenopausal  Z78.0 DG Bone Density      Meds  ordered this encounter  Medications   amLODipine (NORVASC) 5 MG tablet    Sig: Take 1 tablet (5 mg total) by mouth daily.    Dispense:  90 tablet    Refill:  3   metoprolol succinate (TOPROL-XL) 50 MG 24 hr tablet    Sig: Take 1 tablet (50 mg total) by mouth daily. Take with or immediately following a meal.    Dispense:  90 tablet    Refill:  3   I,Jada Bradford,acting as a scribe for Tana Conch, MD.,have documented all relevant documentation on the behalf of Tana Conch, MD,as directed by  Tana Conch, MD while in the presence of Tana Conch, MD.   I, Tana Conch, MD, have reviewed all documentation for this visit. The documentation on 07/03/21 for the exam, diagnosis, procedures, and orders are all accurate and complete.   Return precautions advised.  Tana Conch, MD

## 2021-07-03 ENCOUNTER — Ambulatory Visit: Payer: Self-pay | Admitting: Family Medicine

## 2021-07-03 ENCOUNTER — Encounter: Payer: Self-pay | Admitting: Family Medicine

## 2021-07-03 ENCOUNTER — Other Ambulatory Visit: Payer: Self-pay

## 2021-07-03 VITALS — BP 100/62 | HR 75 | Temp 97.4°F | Ht 67.0 in | Wt 162.6 lb

## 2021-07-03 DIAGNOSIS — R739 Hyperglycemia, unspecified: Secondary | ICD-10-CM

## 2021-07-03 DIAGNOSIS — Z780 Asymptomatic menopausal state: Secondary | ICD-10-CM

## 2021-07-03 DIAGNOSIS — E785 Hyperlipidemia, unspecified: Secondary | ICD-10-CM

## 2021-07-03 DIAGNOSIS — E0590 Thyrotoxicosis, unspecified without thyrotoxic crisis or storm: Secondary | ICD-10-CM

## 2021-07-03 DIAGNOSIS — I2510 Atherosclerotic heart disease of native coronary artery without angina pectoris: Secondary | ICD-10-CM

## 2021-07-03 DIAGNOSIS — I10 Essential (primary) hypertension: Secondary | ICD-10-CM

## 2021-07-03 MED ORDER — AMLODIPINE BESYLATE 5 MG PO TABS: 5.0000 mg | ORAL_TABLET | Freq: Every day | ORAL | 3 refills | Status: DC

## 2021-07-03 MED ORDER — METOPROLOL SUCCINATE ER 50 MG PO TB24: 50.0000 mg | ORAL_TABLET | Freq: Every day | ORAL | 3 refills | Status: DC

## 2021-07-03 NOTE — Patient Instructions (Addendum)
Health Maintenance Due  Topic Date Due   Zoster Vaccines- Shingrix (2 of 2) - Will get next visit.   12/30/2020   INFLUENZA VACCINE  - Please let us know when you receive your flu shot.  01/12/2021   MAMMOGRAM -   [facility] Schedule an appointment by calling [phone].  02/27/2021   We decided to add Metoprolol 50 mg daily back in your regimen and reduce amlodipine 10 mg to 5 mg- current tablets you have, take half of pills. Please update me on any new, persistent or worsening symptoms.   Schedule your bone density test at check out desk.  - located [address] across the street from [place] - in the basement - you DO NEED an appointment for the bone density tests.  -or call back to schedule  Thank you for doing lab! If you have mychart- we will send your results within 3 business days of [place] receiving them.  If you do not have mychart- we will call you about results within 5 business days of [place] receiving them.  *please also note that you will see labs on mychart as soon as they post. I will later go in and write notes on them- will say "notes from Dr. Durene Cal"  Recommended follow up: Return in about 6 months (around 12/31/2021) for physical or sooner if needed.

## 2021-07-04 LAB — TSH: TSH: 0.61 mIU/L (ref 0.40–4.50)

## 2021-07-04 LAB — COMPREHENSIVE METABOLIC PANEL
AG Ratio: 1.5 (calc) (ref 1.0–2.5)
ALT: 9 U/L (ref 6–29)
AST: 15 U/L (ref 10–35)
Albumin: 4.4 g/dL (ref 3.6–5.1)
Alkaline phosphatase (APISO): 69 U/L (ref 37–153)
BUN/Creatinine Ratio: 25 (calc) — ABNORMAL HIGH (ref 6–22)
BUN: 42 mg/dL — ABNORMAL HIGH (ref 7–25)
CO2: 25 mmol/L (ref 20–32)
Calcium: 9.6 mg/dL (ref 8.6–10.4)
Chloride: 107 mmol/L (ref 98–110)
Creat: 1.67 mg/dL — ABNORMAL HIGH (ref 0.50–1.03)
Globulin: 2.9 g/dL (calc) (ref 1.9–3.7)
Glucose, Bld: 100 mg/dL — ABNORMAL HIGH (ref 65–99)
Potassium: 3.9 mmol/L (ref 3.5–5.3)
Sodium: 143 mmol/L (ref 135–146)
Total Bilirubin: 0.3 mg/dL (ref 0.2–1.2)
Total Protein: 7.3 g/dL (ref 6.1–8.1)

## 2021-07-04 LAB — CBC WITH DIFFERENTIAL/PLATELET
Absolute Monocytes: 485 cells/uL (ref 200–950)
Basophils Absolute: 29 cells/uL (ref 0–200)
Basophils Relative: 0.6 %
Eosinophils Absolute: 38 cells/uL (ref 15–500)
Eosinophils Relative: 0.8 %
HCT: 35 % (ref 35.0–45.0)
Hemoglobin: 11.5 g/dL — ABNORMAL LOW (ref 11.7–15.5)
Lymphs Abs: 1618 cells/uL (ref 850–3900)
MCH: 28.2 pg (ref 27.0–33.0)
MCHC: 32.9 g/dL (ref 32.0–36.0)
MCV: 85.8 fL (ref 80.0–100.0)
MPV: 9.3 fL (ref 7.5–12.5)
Monocytes Relative: 10.1 %
Neutro Abs: 2630 cells/uL (ref 1500–7800)
Neutrophils Relative %: 54.8 %
Platelets: 238 10*3/uL (ref 140–400)
RBC: 4.08 10*6/uL (ref 3.80–5.10)
RDW: 12.4 % (ref 11.0–15.0)
Total Lymphocyte: 33.7 %
WBC: 4.8 10*3/uL (ref 3.8–10.8)

## 2021-07-04 LAB — T4, FREE: Free T4: 1.1 ng/dL (ref 0.8–1.8)

## 2021-07-04 LAB — LDL CHOLESTEROL, DIRECT: Direct LDL: 165 mg/dL — ABNORMAL HIGH (ref <100)

## 2021-07-04 LAB — HEMOGLOBIN A1C
Hgb A1c MFr Bld: 5.9 % of total Hgb — ABNORMAL HIGH (ref <5.7)
Mean Plasma Glucose: 123 mg/dL
eAG (mmol/L): 6.8 mmol/L

## 2021-07-09 ENCOUNTER — Telehealth: Payer: Self-pay

## 2021-07-09 NOTE — Telephone Encounter (Signed)
Spoke to pt and she will adjust sugar intake according to labs  She will increase red meat intake for iron because she does not eat a lot of meat.  She is taking lipitor and zetia  Scheduled 2-3 week follow up

## 2021-07-22 NOTE — Progress Notes (Signed)
Phone [phone] In person visit   Subjective:   Carla West is a 61 y.o. year old very pleasant female patient who presents for/with See problem oriented charting Chief Complaint  Patient presents with   Follow-up    Pt is following up on Hyperthyroidism.   cramping in fingers/toes    Pt c/o cramping in fingers/toes and they get stuck and hard to move.    This visit occurred during the SARS-CoV-2 public health emergency.  Safety protocols were in place, including screening questions prior to the visit, additional usage of staff PPE, and extensive cleaning of exam room while observing appropriate contact time as indicated for disinfecting solutions.   Past Medical History-  Patient Active Problem List   Diagnosis Date Noted   CAD (coronary artery disease) 11/07/2020    Priority: High   Hyperthyroidism 02/26/2019    Priority: High   Essential hypertension 08/03/2015    Priority: High   Palpitations 04/11/2018    Priority: Medium    Hyperlipidemia 12/28/2017    Priority: Medium    Migraine headache 08/03/2015    Priority: Medium    Skin lesion 11/05/2020    Priority: Low   Abnormal Pap smear of cervix 08/03/2015    Priority: Low    Medications- reviewed and updated Current Outpatient Medications  Medication Sig Dispense Refill   albuterol (VENTOLIN HFA) 108 (90 Base) MCG/ACT inhaler Inhale 1-2 puffs into the lungs every 6 (six) hours as needed for wheezing or shortness of breath. 18.2 g 0   almotriptan (AXERT) 12.5 MG tablet 1 tab po at onset of ha. Repeat in 2 hours if needed. Max 2 tabs in 24 hours 30 tablet 1   amLODipine (NORVASC) 5 MG tablet Take 1 tablet (5 mg total) by mouth daily. 90 tablet 3   aspirin EC 81 MG tablet Take 1 tablet (81 mg total) by mouth daily. 90 tablet 3   atorvastatin (LIPITOR) 80 MG tablet Take 1 tablet (80 mg total) by mouth daily at 6 PM. 90 tablet 3   ezetimibe (ZETIA) 10 MG tablet Take 1 tablet (10 mg total) by mouth daily. 90  tablet 3   hydrOXYzine (ATARAX/VISTARIL) 25 MG tablet 1 tab po q hs prn insomnia 90 tablet 1   losartan (COZAAR) 100 MG tablet Take 1 tablet (100 mg total) by mouth daily. 90 tablet 3   methimazole (TAPAZOLE) 10 MG tablet Take 1 tablet (10 mg total) by mouth 3 (three) times daily. Take 10 mg in a.m. and 10 mg in the evening, with meals 90 tablet 2   metoprolol succinate (TOPROL-XL) 50 MG 24 hr tablet Take 1 tablet (50 mg total) by mouth daily. Take with or immediately following a meal. 90 tablet 3   promethazine (PHENERGAN) 25 MG tablet Take 1 tablet (25 mg total) by mouth every 6 (six) hours as needed for nausea or vomiting. 30 tablet 0   ticagrelor (BRILINTA) 90 MG TABS tablet Take 1 tablet (90 mg total) by mouth 2 (two) times daily. 180 tablet 0   triamterene-hydrochlorothiazide (MAXZIDE-25) 37.5-25 MG tablet Take 1 tablet by mouth daily. 90 tablet 3   No current facility-administered medications for this visit.     Objective:  BP 110/84    Pulse 78    Temp 98.7 F (37.1 C)    Ht 5\' 7"  (1.702 m)    Wt 157 lb 3.2 oz (71.3 kg)    SpO2 98%    BMI 24.62 kg/m  Gen: NAD, resting  comfortably CV: RRR no murmurs rubs or gallops Lungs: CTAB no crackles, wheeze, rhonchi Ext: no edema Skin: warm, dry     Assessment and Plan   #CAD with history of stent 02/27/2019 At time of active graves disease #hyperlipidemia-LDL goal under 70 S: Medication: brilinta 90 mg BID, aspirin 81mg , atorvastatin 80mg , zetia added 11/07/2020 - no chest pain or shortness of breath Lab Results  Component Value Date   CHOL 175 11/04/2020   HDL 67.30 11/04/2020   LDLCALC 93 11/04/2020   LDLDIRECT 165 (H) 07/03/2021   TRIG 74.0 11/04/2020   CHOLHDL 3 11/04/2020   A/P: CAD asymptomatic- though event likely caused by active graves disease.  - LDL goal under 70 and even with zetia and max dose statin at 165- encouraged follow up with Carla West and ask his opinion about lipid clinic  #Resistant hypertension-  hyperthyroidism possibly contributing S: medication: amlodipine 5 mg down from 10 mg, losartan 100mg  daily, metoprolol 50 mg  daily, triamterene-hctz 37.5-25mg  PM Home readings #s: 108/98 most recent check- rather close systolic and diastolic -did have slight HA today which usually goes along with high BP but Bp was not high BP Readings from Last 3 Encounters:  07/28/21 110/84  07/03/21 100/62  01/08/21 (!) 143/92  A/P: for now continue current meds but update CMP and if BUN/cr ratio suggests dehydration- likely cut maxzide 25 in half and have her update me in a few weeks.  - has had some intermittent cramping- wonder if electrolyte imbalance- she feels she does an excellent job with hydration -in regards to worsening renal function- avoids nsaids  #hyperthyroidism/graves disease-follows with Carla West S: compliant On thyroid medication-methimazole 10mg  three times daily listed but she is taking twice a day and doing well.  - RAI or surgery in active consideration - she has wanted to hold off -may be cause of high BP  Lab Results  Component Value Date   TSH 0.61 07/03/2021  A/P:thyroid better controlled last check- continue current meds    # Migraines S: 3x per week in past- now down to once a week. Worsened with BP higher. Did have to take almotriptan this morning and was effective. If doesn't catch early has issues A/P: doing better overall with better BP- continue current meds. Restart metoprolol 50 mg XR.    # Hyperglycemia/insulin resistance/prediabetes- peak a1c of 5.9 S: Medication: none Exercise and diet- exercising by walking and class at [facility] one a week, eating reasonably well Lab Results  Component Value Date   HGBA1C 5.9 (H) 07/03/2021   HGBA1C 5.9 (H) 02/13/2020   HGBA1C 5.5 02/27/2019   A/P: Controlled. Continue current medications.   # HM- encouragd her to call to schedule Recommended follow up: No follow-ups on file. Future Appointments  Date Time Provider  Department Center  01/01/2022  3:40 PM Carla Majestic, MD [facility]    Lab/Order associations:   ICD-10-CM   1. Hyperthyroidism  E05.90     2. Hyperlipidemia, unspecified hyperlipidemia type  E78.5     3. Resistant hypertension  I10 Comprehensive metabolic panel    CBC with Differential/Platelet    4. Hyperglycemia  R73.9     5. Graves disease  E05.00     6. Coronary artery disease involving native coronary artery of native heart without angina pectoris  I25.10      I,Jada Bradford,acting as a scribe for Tana Conch, MD.,have documented all relevant documentation on the behalf of Tana Conch, MD,as directed by  Jeannett Senior  Durene Cal, MD while in the presence of Tana Conch, MD.  I, Tana Conch, MD, have reviewed all documentation for this visit. The documentation on 07/28/21 for the exam, diagnosis, procedures, and orders are all accurate and complete.  Return precautions advised.  Tana Conch, MD

## 2021-07-28 ENCOUNTER — Encounter: Payer: Self-pay | Admitting: Family Medicine

## 2021-07-28 ENCOUNTER — Other Ambulatory Visit: Payer: Self-pay

## 2021-07-28 ENCOUNTER — Ambulatory Visit (INDEPENDENT_AMBULATORY_CARE_PROVIDER_SITE_OTHER): Payer: 59 | Admitting: Family Medicine

## 2021-07-28 VITALS — BP 110/84 | HR 78 | Temp 98.7°F | Ht 67.0 in | Wt 157.2 lb

## 2021-07-28 DIAGNOSIS — R739 Hyperglycemia, unspecified: Secondary | ICD-10-CM

## 2021-07-28 DIAGNOSIS — I2510 Atherosclerotic heart disease of native coronary artery without angina pectoris: Secondary | ICD-10-CM

## 2021-07-28 DIAGNOSIS — E0590 Thyrotoxicosis, unspecified without thyrotoxic crisis or storm: Secondary | ICD-10-CM | POA: Diagnosis not present

## 2021-07-28 DIAGNOSIS — E785 Hyperlipidemia, unspecified: Secondary | ICD-10-CM | POA: Diagnosis not present

## 2021-07-28 DIAGNOSIS — E0500 Thyrotoxicosis with diffuse goiter without thyrotoxic crisis or storm: Secondary | ICD-10-CM

## 2021-07-28 DIAGNOSIS — I10 Essential (primary) hypertension: Secondary | ICD-10-CM

## 2021-07-28 NOTE — Patient Instructions (Addendum)
Health Maintenance Due  Topic Date Due   MAMMOGRAM  02/27/2021  Please call [facility] Schedule an appointment by calling [phone].  Thanks for doing labs If you have mychart- we will send your results within 3 business days of [place] receiving them.  If you do not have mychart- we will call you about results within 5 business days of [place] receiving them.  *please also note that you will see labs on mychart as soon as they post. I will later go in and write notes on them- will say "notes from Dr. Durene Cal"  Depending on direction of kidney function considering cutting triamterne-hctz in half and seeing how you do with home readings.   Stay well hydrated  Recommended follow up: Return for next already scheduled visit or sooner if needed.

## 2021-07-29 LAB — CBC WITH DIFFERENTIAL/PLATELET
Basophils Absolute: 0 10*3/uL (ref 0.0–0.1)
Basophils Relative: 0.8 % (ref 0.0–3.0)
Eosinophils Absolute: 0 10*3/uL (ref 0.0–0.7)
Eosinophils Relative: 0.7 % (ref 0.0–5.0)
HCT: 36.9 % (ref 36.0–46.0)
Hemoglobin: 12.3 g/dL (ref 12.0–15.0)
Lymphocytes Relative: 32 % (ref 12.0–46.0)
Lymphs Abs: 1.6 10*3/uL (ref 0.7–4.0)
MCHC: 33.3 g/dL (ref 30.0–36.0)
MCV: 85.2 fl (ref 78.0–100.0)
Monocytes Absolute: 0.4 10*3/uL (ref 0.1–1.0)
Monocytes Relative: 7.8 % (ref 3.0–12.0)
Neutro Abs: 2.9 10*3/uL (ref 1.4–7.7)
Neutrophils Relative %: 58.7 % (ref 43.0–77.0)
Platelets: 296 10*3/uL (ref 150.0–400.0)
RBC: 4.33 Mil/uL (ref 3.87–5.11)
RDW: 14.2 % (ref 11.5–15.5)
WBC: 5 10*3/uL (ref 4.0–10.5)

## 2021-07-30 LAB — COMPREHENSIVE METABOLIC PANEL
ALT: 10 U/L (ref 0–35)
AST: 17 U/L (ref 0–37)
Albumin: 4.5 g/dL (ref 3.5–5.2)
Alkaline Phosphatase: 74 U/L (ref 39–117)
BUN: 27 mg/dL — ABNORMAL HIGH (ref 6–23)
CO2: 30 mEq/L (ref 19–32)
Calcium: 9.6 mg/dL (ref 8.4–10.5)
Chloride: 102 mEq/L (ref 96–112)
Creatinine, Ser: 1.33 mg/dL — ABNORMAL HIGH (ref 0.40–1.20)
GFR: 44.1 mL/min — ABNORMAL LOW (ref >60.00)
Glucose, Bld: 91 mg/dL (ref 70–99)
Potassium: 4.6 mEq/L (ref 3.5–5.1)
Sodium: 137 mEq/L (ref 135–145)
Total Bilirubin: 0.3 mg/dL (ref 0.2–1.2)
Total Protein: 7.8 g/dL (ref 6.0–8.3)

## 2021-08-03 ENCOUNTER — Other Ambulatory Visit: Payer: Self-pay

## 2021-08-03 DIAGNOSIS — R7989 Other specified abnormal findings of blood chemistry: Secondary | ICD-10-CM

## 2021-09-04 ENCOUNTER — Other Ambulatory Visit: Payer: 59

## 2021-09-07 ENCOUNTER — Other Ambulatory Visit (INDEPENDENT_AMBULATORY_CARE_PROVIDER_SITE_OTHER): Payer: 59

## 2021-09-07 DIAGNOSIS — R7989 Other specified abnormal findings of blood chemistry: Secondary | ICD-10-CM

## 2021-09-07 LAB — BASIC METABOLIC PANEL
BUN: 23 mg/dL (ref 6–23)
CO2: 25 mEq/L (ref 19–32)
Calcium: 9.2 mg/dL (ref 8.4–10.5)
Chloride: 104 mEq/L (ref 96–112)
Creatinine, Ser: 1.07 mg/dL (ref 0.40–1.20)
GFR: 57.21 mL/min — ABNORMAL LOW (ref >60.00)
Glucose, Bld: 93 mg/dL (ref 70–99)
Potassium: 4.1 mEq/L (ref 3.5–5.1)
Sodium: 138 mEq/L (ref 135–145)

## 2021-10-29 ENCOUNTER — Other Ambulatory Visit: Payer: Self-pay | Admitting: Medical

## 2021-10-29 ENCOUNTER — Other Ambulatory Visit: Payer: Self-pay | Admitting: Family Medicine

## 2021-11-24 ENCOUNTER — Telehealth: Payer: Self-pay | Admitting: Family Medicine

## 2021-11-24 MED ORDER — HYDROXYZINE HCL 25 MG PO TABS: ORAL_TABLET | ORAL | 1 refills | Status: DC

## 2021-11-24 NOTE — Telephone Encounter (Signed)
Rx sent to pharmacy   

## 2021-11-24 NOTE — Telephone Encounter (Signed)
Pt is going out of town tomorrow and is asking if it can be sent in today.  .. Encourage patient to contact the pharmacy for refills or they can request refills through [facility]  LAST APPOINTMENT DATE:  07/28/21  NEXT APPOINTMENT DATE: 01/01/22  MEDICATION:hydrOXYzine (ATARAX/VISTARIL) 25 MG tablet  Is the patient out of medication?   PHARMACY: [id number] - [place], [place] - [address] AT [facility] [address] [facility] [address] Phone:  [phone]  Fax:  [phone]      Let patient know to contact pharmacy at the end of the day to make sure medication is ready.  Please notify patient to allow 48-72 hours to process

## 2022-01-01 ENCOUNTER — Other Ambulatory Visit: Payer: Self-pay

## 2022-01-01 ENCOUNTER — Other Ambulatory Visit (HOSPITAL_COMMUNITY)
Admission: RE | Admit: 2022-01-01 | Discharge: 2022-01-01 | Disposition: A | Discharge status: Home / Self Care | Payer: 59 | Source: Ambulatory Visit | Attending: Family Medicine | Admitting: Family Medicine

## 2022-01-01 ENCOUNTER — Encounter: Payer: Self-pay | Admitting: Family Medicine

## 2022-01-01 ENCOUNTER — Ambulatory Visit (INDEPENDENT_AMBULATORY_CARE_PROVIDER_SITE_OTHER): Payer: 59 | Admitting: Family Medicine

## 2022-01-01 VITALS — BP 120/84 | HR 70 | Temp 98.7°F | Resp 16 | Ht 67.0 in | Wt 164.8 lb

## 2022-01-01 DIAGNOSIS — Z0000 Encounter for general adult medical examination without abnormal findings: Secondary | ICD-10-CM | POA: Insufficient documentation

## 2022-01-01 DIAGNOSIS — R002 Palpitations: Secondary | ICD-10-CM

## 2022-01-01 DIAGNOSIS — I2510 Atherosclerotic heart disease of native coronary artery without angina pectoris: Secondary | ICD-10-CM

## 2022-01-01 DIAGNOSIS — Z113 Encounter for screening for infections with a predominantly sexual mode of transmission: Secondary | ICD-10-CM | POA: Insufficient documentation

## 2022-01-01 DIAGNOSIS — Z114 Encounter for screening for human immunodeficiency virus [HIV]: Secondary | ICD-10-CM

## 2022-01-01 DIAGNOSIS — M8588 Other specified disorders of bone density and structure, other site: Secondary | ICD-10-CM

## 2022-01-01 DIAGNOSIS — R739 Hyperglycemia, unspecified: Secondary | ICD-10-CM

## 2022-01-01 DIAGNOSIS — Z1231 Encounter for screening mammogram for malignant neoplasm of breast: Secondary | ICD-10-CM

## 2022-01-01 DIAGNOSIS — I10 Essential (primary) hypertension: Secondary | ICD-10-CM

## 2022-01-01 DIAGNOSIS — Z118 Encounter for screening for other infectious and parasitic diseases: Secondary | ICD-10-CM | POA: Diagnosis present

## 2022-01-01 DIAGNOSIS — E0590 Thyrotoxicosis, unspecified without thyrotoxic crisis or storm: Secondary | ICD-10-CM | POA: Diagnosis not present

## 2022-01-01 DIAGNOSIS — E785 Hyperlipidemia, unspecified: Secondary | ICD-10-CM

## 2022-01-01 LAB — COMPREHENSIVE METABOLIC PANEL
Albumin: 4.9 (ref 3.5–5.0)
Calcium: 10.2 (ref 8.7–10.7)
Globulin: 2.3

## 2022-01-01 LAB — HEMOGLOBIN A1C: Hemoglobin A1C: 5.4

## 2022-01-01 LAB — LIPID PANEL
Cholesterol: 301 — AB (ref 0–200)
HDL: 81 — AB (ref 35–70)
Triglycerides: 119 (ref 40–160)

## 2022-01-01 LAB — HEPATIC FUNCTION PANEL
ALT: 12 U/L (ref 7–35)
AST: 16 (ref 13–35)

## 2022-01-01 LAB — BASIC METABOLIC PANEL
BUN: 19 (ref 4–21)
CO2: 23 — AB (ref 13–22)
Chloride: 105 (ref 99–108)
Creatinine: 0.9 (ref 0.5–1.1)
Glucose: 96
Potassium: 6.2 mEq/L — AB (ref 3.5–5.1)
Sodium: 144 (ref 137–147)

## 2022-01-01 LAB — CBC AND DIFFERENTIAL
HCT: 43 (ref 36–46)
Hemoglobin: 14.6 (ref 12.0–16.0)
Platelets: 237 10*3/uL (ref 150–400)
WBC: 6.5

## 2022-01-01 LAB — CBC: RBC: 4.74 (ref 3.87–5.11)

## 2022-01-01 MED ORDER — PROMETHAZINE HCL 25 MG PO TABS: 25.0000 mg | ORAL_TABLET | Freq: Four times a day (QID) | ORAL | 0 refills | Status: DC | PRN

## 2022-01-01 MED ORDER — ALMOTRIPTAN MALATE 12.5 MG PO TABS: ORAL_TABLET | ORAL | 1 refills | Status: DC

## 2022-01-01 MED ORDER — LOSARTAN POTASSIUM 50 MG PO TABS: ORAL_TABLET | ORAL | 3 refills | Status: DC

## 2022-01-01 NOTE — Progress Notes (Signed)
Phone [phone]   Subjective:  Patient presents today for their annual physical. Chief complaint-noted.   See problem oriented charting- Review of Systems  Constitutional:  Positive for malaise/fatigue (baseline level of fatigue). Negative for chills, fever and weight loss.  HENT:  Negative for hearing loss and tinnitus.   Eyes:  Negative for blurred vision and double vision.  Respiratory:  Negative for cough and shortness of breath.   Cardiovascular:  Positive for palpitations. Negative for chest pain, claudication and leg swelling.  Gastrointestinal:  Negative for abdominal pain, blood in stool, constipation, diarrhea, heartburn, melena, nausea and vomiting.       Does have bloating/abdominal distension   Genitourinary:  Negative for dysuria and frequency.  Musculoskeletal:  Positive for joint pain (knees). Negative for myalgias.  Skin:  Negative for itching and rash.  Neurological:  Positive for headaches (migraines once a month). Negative for dizziness, tingling and tremors.  Endo/Heme/Allergies:  Negative for polydipsia. Does not bruise/bleed easily.  Psychiatric/Behavioral:  Negative for depression and suicidal ideas. The patient is nervous/anxious and has insomnia (only takes medicine if has been a few days of poor sleep).    The following were reviewed and entered/updated in epic: Past Medical History:  Diagnosis Date   Abnormal Pap smear of cervix 08/03/2015   Anxiety    Benign essential HTN 08/03/2015   Depression    Graves disease    History of chicken pox    Migraine headache 08/03/2015   Patient Active Problem List   Diagnosis Date Noted   CAD (coronary artery disease) 11/07/2020    Priority: High   Hyperthyroidism 02/26/2019    Priority: High   Essential hypertension 08/03/2015    Priority: High   Palpitations 04/11/2018    Priority: Medium    Hyperlipidemia 12/28/2017    Priority: Medium    Migraine headache 08/03/2015    Priority: Medium    Skin lesion  11/05/2020    Priority: Low   Abnormal Pap smear of cervix 08/03/2015    Priority: Low   Osteopenia of lumbar spine 12/07/2017   Past Surgical History:  Procedure Laterality Date   CORONARY STENT INTERVENTION N/A 02/27/2019   Procedure: CORONARY STENT INTERVENTION;  Surgeon: Burnell Blanks, MD;  Location: [facility];  Service: Cardiovascular;  Laterality: N/A;   LEFT HEART CATH AND CORONARY ANGIOGRAPHY N/A 02/27/2019   Procedure: LEFT HEART CATH AND CORONARY ANGIOGRAPHY;  Surgeon: Burnell Blanks, MD;  Location: [facility];  Service: Cardiovascular;  Laterality: N/A;   TUBAL LIGATION      Family History  Problem Relation Age of Onset   Hypertension Mother    Heart disease Mother        in late 64s   Hyperlipidemia Father    Stroke Father        later in life   Breast cancer Maternal Grandmother    Hyperlipidemia Sister    Other Brother        plan crash in [employer]   Migraines Son    Heart disease Brother        CHF, arrythmia   Liver disease Brother        alcohol   Hypertension Brother    Gout Brother    Hypertension Brother     Medications- reviewed and updated Current Outpatient Medications  Medication Sig Dispense Refill   albuterol (VENTOLIN HFA) 108 (90 Base) MCG/ACT inhaler Inhale 1-2 puffs into the lungs every 6 (six) hours as needed  for wheezing or shortness of breath. 18.2 g 0   amLODipine (NORVASC) 5 MG tablet Take 1 tablet (5 mg total) by mouth daily. 90 tablet 3   aspirin EC 81 MG tablet Take 1 tablet (81 mg total) by mouth daily. 90 tablet 3   atorvastatin (LIPITOR) 80 MG tablet Take 1 tablet (80 mg total) by mouth daily at 6 PM. 90 tablet 3   ezetimibe (ZETIA) 10 MG tablet Take 1 tablet (10 mg total) by mouth daily. 90 tablet 3   hydrOXYzine (ATARAX) 25 MG tablet 1 tab po q hs prn insomnia 90 tablet 1   methimazole (TAPAZOLE) 10 MG tablet TAKE 1 TABLET(10 MG) BY MOUTH TWICE DAILY IN THE MORNING AND IN THE EVENING WITH MEALS  180 tablet 0   metoprolol succinate (TOPROL-XL) 50 MG 24 hr tablet Take 1 tablet (50 mg total) by mouth daily. Take with or immediately following a meal. 90 tablet 3   ticagrelor (BRILINTA) 90 MG TABS tablet Take 1 tablet (90 mg total) by mouth 2 (two) times daily. 180 tablet 0   triamterene-hydrochlorothiazide (MAXZIDE-25) 37.5-25 MG tablet Take 1 tablet by mouth daily. 90 tablet 3   almotriptan (AXERT) 12.5 MG tablet 1 tab po at onset of ha. Repeat in 2 hours if needed. Max 2 tabs in 24 hours 30 tablet 1   losartan (COZAAR) 50 MG tablet TAKE 1 TABLET(50 MG) BY MOUTH DAILY 90 tablet 3   promethazine (PHENERGAN) 25 MG tablet Take 1 tablet (25 mg total) by mouth every 6 (six) hours as needed for nausea or vomiting. 30 tablet 0   No current facility-administered medications for this visit.    Allergies-reviewed and updated Allergies  Allergen Reactions   Elavil [Amitriptyline Hcl] Other (See Comments)    Jittery..the patient states she has not had any side effects     Social History   Social History Narrative   Divorced 2018. Lives with her daughter (adult)   Also has a son - he lives in [place] now- works for [employer] as Engineer, site at a skilled facility- will be at [employer]. Works out of [employer]: Enjoys netflix, going to [employer]   Objective  Objective:  BP 120/84   Pulse 70   Temp 98.7 F (37.1 C) (Temporal)   Resp 16   Ht _0  (1.702 m)   Wt 164 lb 12.8 oz (74.8 kg)   SpO2 97%   BMI 25.81 kg/m  Gen: NAD, resting comfortably HEENT: Mucous membranes are moist. Oropharynx normal Neck: no thyromegaly CV: RRR no murmurs rubs or gallops Lungs: CTAB no crackles, wheeze, rhonchi Abdomen: soft/nontender except mild to moderate tenderness from mid abdomen down- she reports area feels firmer to her than usual/nondistended/normal bowel sounds. No rebound or guarding.  Ext: no edema Skin: warm, dry Neuro: grossly normal, moves all  extremities, PERRLA   Assessment and Plan   61 y.o. female presenting for annual physical.  Health Maintenance counseling: 1. Anticipatory guidance: Patient counseled regarding regular dental exams -q6 months, eye exams -yearly,  avoiding smoking and second hand smoke , limiting alcohol to 1 beverage per day- doesn't drink , no illicit drugs .   2. Risk factor reduction:  Advised patient of need for regular exercise and diet rich and fruits and vegetables to reduce risk of heart attack and stroke.  Exercise- zoomba at gym, bought skates  after renting with son in  [place]. Weight going up since she started exercising though- had actually lost previously Diet/weight management-eats mainly cooked foods at home, eats out only on weekends. Brings lunch to work. .  Wt Readings from Last 3 Encounters:  01/01/22 164 lb 12.8 oz (74.8 kg)  07/28/21 157 lb 3.2 oz (71.3 kg)  07/03/21 162 lb 9.6 oz (73.8 kg)  3. Immunizations/screenings/ancillary studies- covid 19 vaccine recommended - she had extreme reaction with ER trip in past so wants to hold off, shingrix recommended - she will come back for this  Immunization History  Administered Date(s) Administered   Influenza,inj,Quad PF,6+ Mos 02/28/2018, 03/14/2019   Moderna Sars-Covid-2 Vaccination 06/25/2019, 07/23/2019   Tdap 11/13/2015   Zoster Recombinat (Shingrix) 11/04/2020  4. Cervical cancer screening- 11/04/20 with 3 year repeat 5. Breast cancer screening-  breast exam - self exams- and mammogram -  needs updated 6. Colon cancer screening - due again 2025- cologuard 11/27/20 7. Skin cancer screening- lower risk due to melanin content. advised regular sunscreen use. Denies worrisome, changing, or new skin lesions.  8. Birth control/STD check- postmenopausal. Not sexually active at moment- would use protection and always has 9. Osteoporosis screening at 80-  10. Smoking associated screening - never smoker  Status of chronic or acute concerns    #abdominal fullness/bloating- noted about 3 months ago . Tried to improve fluid intake and wondered if related to that but does not think so. Has good regular bowel movements- at least 3 x a day (normal for her). Has noted seems to continue to enlarge.  - CT to be considered if lab work unrevealing to evaluate for malignancy with bloating/fullness sensation and tenderness  # anxiety S:has been feeling higher anxiety at work for 2-3 months.  Gets palpitations with this- no chest pain or shortness of breath. Mildly sweaty and clammy. No particular triggers ta work more than usual.  A/P: evaluate thyroid to see if contributing. Offered cardiac monitor - she opts in given palpitations. Not active today so EKG low yield- already on metoprolol which should help certain issues- depending on findings could send back to cardiology   #CAD with history of stent 02/27/2019  At time of active graves disease #hyperlipidemia-LDL goal under 70 S: Medication: brilinta 90 mg BID, aspirin 81mg , atorvastatin 80mg , zetia added 11/07/2020 -no chest pain or SOB Lab Results  Component Value Date   CHOL 175 11/04/2020   HDL 67.30 11/04/2020   LDLCALC 93 11/04/2020   LDLDIRECT 165 (H) 07/03/2021   TRIG 74.0 11/04/2020   CHOLHDL 3 11/04/2020   A/P: hopefully improved - update lipids. CAD asymptomatic- continue current meds  #Resistant hypertension- hyperthyroidism possibly contributing S: medication: amlodipine 5 mg PM, losartan 50mg  AM, metoprolol 50mg  XR PM, triamterene-hctz 37.5-25mg  PM -normal renal arteries 03/12/20 A/P: Controlled. Continue current medications.   #hyperthyroidism/graves disease-follows with Dr. Kelton Pillar S: compliant On thyroid medication-methimazole 10mg  BID  - RAI or surgery in active consideration   A/P:hopefully stable- update tsh, t4 today. Continue current meds for now. Enocuraged follow up    # Migraines S: down to 1 a month lately. In past Worse with BP higher. BP possibly  higher from graves  -almotriptan prn  A/P: doing well overall- continue to monitor   # Hyperglycemia/insulin resistance/prediabetes- peak a1c of 5.9 S:  Medication: none Exercise and diet- workign on diet/exercise  A/P: hopefully stable- update a1c today.   #IBS- dicyclomine per GI   #Insomnia- groggy next day on hydroxyzine- uses very sparingly   Recommended follow  up: Return in about 1 month (around 02/01/2022) for followup or sooner if needed.Schedule b4 you leave.  Lab/Order associations:NOT fasting   ICD-10-CM   1. Preventative health care  Z00.00 Comp Met (CMET)    CBC with Differential/Platelet    TSH    HgB A1c    T4, free    Lipid panel    MM Digital Screening    HIV Antibody (routine testing w rflx)    RPR    Urine cytology ancillary only    2. Hyperthyroidism  E05.90 TSH    T4, free    3. Essential hypertension  I10     4. Coronary artery disease involving native coronary artery of native heart without angina pectoris  I25.10     5. Hyperlipidemia, unspecified hyperlipidemia type  E78.5 Comp Met (CMET)    CBC with Differential/Platelet    Lipid panel    6. Hyperglycemia  R73.9 HgB A1c    7. Encounter for screening mammogram for malignant neoplasm of breast  Z12.31 MM Digital Screening    8. Screening for HIV (human immunodeficiency virus)  Z11.4 HIV Antibody (routine testing w rflx)    9. Screening examination for venereal disease  Z11.3 RPR    10. Screening for gonorrhea  Z11.3 Urine cytology ancillary only    11. Screening for chlamydial disease  Z11.8 Urine cytology ancillary only    12. Osteopenia of lumbar spine  M85.88 DG Bone Density    13. Palpitations  R00.2 Cardiac event monitor      Meds ordered this encounter  Medications   almotriptan (AXERT) 12.5 MG tablet    Sig: 1 tab po at onset of ha. Repeat in 2 hours if needed. Max 2 tabs in 24 hours    Dispense:  30 tablet    Refill:  1   promethazine (PHENERGAN) 25 MG tablet    Sig: Take  1 tablet (25 mg total) by mouth every 6 (six) hours as needed for nausea or vomiting.    Dispense:  30 tablet    Refill:  0   losartan (COZAAR) 50 MG tablet    Sig: TAKE 1 TABLET(50 MG) BY MOUTH DAILY    Dispense:  90 tablet    Refill:  3    Return precautions advised.  Garret Reddish, MD

## 2022-01-01 NOTE — Patient Instructions (Addendum)
Health Maintenance Due  Topic Date Due   Zoster Vaccines- Shingrix (2 of 2)  Call back to schedule nurse visit for this- final shot 12/30/2020   MAMMOGRAM   [facility] Schedule an appointment by calling [phone].  02/27/2021   Thanks for doing labs If you have mychart- we will send your results within 3 business days of [place] receiving them.  If you do not have mychart- we will call you about results within 5 business days of [place] receiving them.  *please also note that you will see labs on mychart as soon as they post. I will later go in and write notes on them- will say "notes from Dr. Durene Cal"   Recommended follow up: Return in about 1 month (around 02/01/2022) for followup or sooner if needed.Schedule b4 you leave.

## 2022-01-02 ENCOUNTER — Emergency Department (HOSPITAL_BASED_OUTPATIENT_CLINIC_OR_DEPARTMENT_OTHER)
Admission: EM | Admit: 2022-01-02 | Discharge: 2022-01-02 | Disposition: A | Discharge status: Home / Self Care | Payer: 59 | Attending: Emergency Medicine | Admitting: Emergency Medicine

## 2022-01-02 ENCOUNTER — Encounter (HOSPITAL_BASED_OUTPATIENT_CLINIC_OR_DEPARTMENT_OTHER): Payer: Self-pay | Admitting: Emergency Medicine

## 2022-01-02 ENCOUNTER — Other Ambulatory Visit: Payer: Self-pay

## 2022-01-02 ENCOUNTER — Emergency Department (HOSPITAL_BASED_OUTPATIENT_CLINIC_OR_DEPARTMENT_OTHER): Payer: 59 | Admitting: Radiology

## 2022-01-02 DIAGNOSIS — Z7982 Long term (current) use of aspirin: Secondary | ICD-10-CM | POA: Diagnosis not present

## 2022-01-02 DIAGNOSIS — I10 Essential (primary) hypertension: Secondary | ICD-10-CM | POA: Diagnosis not present

## 2022-01-02 DIAGNOSIS — E039 Hypothyroidism, unspecified: Secondary | ICD-10-CM | POA: Diagnosis not present

## 2022-01-02 DIAGNOSIS — R0789 Other chest pain: Secondary | ICD-10-CM | POA: Diagnosis present

## 2022-01-02 DIAGNOSIS — I2510 Atherosclerotic heart disease of native coronary artery without angina pectoris: Secondary | ICD-10-CM | POA: Insufficient documentation

## 2022-01-02 DIAGNOSIS — R799 Abnormal finding of blood chemistry, unspecified: Secondary | ICD-10-CM | POA: Insufficient documentation

## 2022-01-02 DIAGNOSIS — Z79899 Other long term (current) drug therapy: Secondary | ICD-10-CM | POA: Insufficient documentation

## 2022-01-02 LAB — BASIC METABOLIC PANEL
Anion gap: 10 (ref 5–15)
BUN: 23 mg/dL — ABNORMAL HIGH (ref 6–20)
CO2: 28 mmol/L (ref 22–32)
Calcium: 9.5 mg/dL (ref 8.9–10.3)
Chloride: 106 mmol/L (ref 98–111)
Creatinine, Ser: 1.03 mg/dL — ABNORMAL HIGH (ref 0.44–1.00)
GFR, Estimated: >60 mL/min (ref >60)
Glucose, Bld: 104 mg/dL — ABNORMAL HIGH (ref 70–99)
Potassium: 3.8 mmol/L (ref 3.5–5.1)
Sodium: 144 mmol/L (ref 135–145)

## 2022-01-02 LAB — CBC
HCT: 36 % (ref 36.0–46.0)
Hemoglobin: 11.7 g/dL — ABNORMAL LOW (ref 12.0–15.0)
MCH: 28.5 pg (ref 26.0–34.0)
MCHC: 32.5 g/dL (ref 30.0–36.0)
MCV: 87.8 fL (ref 80.0–100.0)
Platelets: 248 10*3/uL (ref 150–400)
RBC: 4.1 MIL/uL (ref 3.87–5.11)
RDW: 13.4 % (ref 11.5–15.5)
WBC: 3.6 10*3/uL — ABNORMAL LOW (ref 4.0–10.5)
nRBC: 0 % (ref 0.0–0.2)

## 2022-01-02 LAB — TROPONIN I (HIGH SENSITIVITY)
Troponin I (High Sensitivity): 3 ng/L (ref <18)
Troponin I (High Sensitivity): 3 ng/L (ref <18)

## 2022-01-02 MED ORDER — FENTANYL CITRATE PF 50 MCG/ML IJ SOSY
25.0000 ug | PREFILLED_SYRINGE | Freq: Once | INTRAMUSCULAR | Status: AC
  Administered 2022-01-02: 25 ug via INTRAVENOUS
  Filled 2022-01-02: qty 1

## 2022-01-02 NOTE — Discharge Instructions (Addendum)
1.  The elevated potassium level done on your outpatient lab was a lab error.  Repeat testing in the emergency department shows normal potassium and your kidney function is at your normal baseline. 2.  Your heart labs were normal at this time no sign of heart attack.  Follow-up with your doctor this week to discuss further monitoring. 3.  Return to emergency department immediately if you have new worsening or concerning symptoms

## 2022-01-02 NOTE — ED Notes (Signed)
Discharge instructions and follow up care reviewed and explained. Pt verbalized understanding and had no further questions on d/c. Pt caox4, ambulatory, and denying any complaints on departure.

## 2022-01-02 NOTE — ED Provider Notes (Signed)
[facility] EMERGENCY DEPT Provider Note  CSN: [id number] Arrival date & time: 01/02/22 6195  Chief Complaint(s) Abnormal Lab and Chest Pain  HPI Carla West is a 61 y.o. female with a past medical history listed below including hyperthyroidism from Graves' disease, CAD status post NSTEMI with 1 stent who presents to the emergency department after being called by on-call nurse for abnormal labs drawn during routine checkup.  She was told that her potassium was elevated.  Prior to this the patient was at his normal state of health however after the call she began having left-sided chest discomfort described as tingling sensation.  It is nonradiating nonexertional without associated shortness of breath, nausea or emesis.  She denies any abdominal pain.  No other physical complaints.  Pain was 5 out of 10 but has subsided since.  She reports that during her prior MI she really did not have any chest pain but had a tingling sensation much like today.  Patient also reports that she feels like she might have a migraine coming on.  The history is provided by the patient.    Past Medical History Past Medical History:  Diagnosis Date   Abnormal Pap smear of cervix 08/03/2015   Anxiety    Benign essential HTN 08/03/2015   Depression    Graves disease    History of chicken pox    Migraine headache 08/03/2015   Patient Active Problem List   Diagnosis Date Noted   CAD (coronary artery disease) 11/07/2020   Skin lesion 11/05/2020   Hyperthyroidism 02/26/2019   Palpitations 04/11/2018   Hyperlipidemia 12/28/2017   Osteopenia of lumbar spine 12/07/2017   Essential hypertension 08/03/2015   Migraine headache 08/03/2015   Abnormal Pap smear of cervix 08/03/2015   Home Medication(s) Prior to Admission medications   Medication Sig Start Date End Date Taking? Authorizing Provider  albuterol (VENTOLIN HFA) 108 (90 Base) MCG/ACT inhaler Inhale 1-2 puffs into the lungs every 6  (six) hours as needed for wheezing or shortness of breath. 01/08/21   Ivette Loyal, NP  almotriptan (AXERT) 12.5 MG tablet 1 tab po at onset of ha. Repeat in 2 hours if needed. Max 2 tabs in 24 hours 01/01/22   Shelva Majestic, MD  amLODipine (NORVASC) 5 MG tablet Take 1 tablet (5 mg total) by mouth daily. 07/03/21   Shelva Majestic, MD  aspirin EC 81 MG tablet Take 1 tablet (81 mg total) by mouth daily. 01/29/19   Georgeanna Lea, MD  atorvastatin (LIPITOR) 80 MG tablet Take 1 tablet (80 mg total) by mouth daily at 6 PM. 11/03/20   Saguier, Ramon Dredge, PA-C  ezetimibe (ZETIA) 10 MG tablet Take 1 tablet (10 mg total) by mouth daily. 06/04/21   Shelva Majestic, MD  hydrOXYzine (ATARAX) 25 MG tablet 1 tab po q hs prn insomnia 11/24/21   Shelva Majestic, MD  losartan (COZAAR) 50 MG tablet TAKE 1 TABLET(50 MG) BY MOUTH DAILY 01/01/22   Shelva Majestic, MD  methimazole (TAPAZOLE) 10 MG tablet TAKE 1 TABLET(10 MG) BY MOUTH TWICE DAILY IN THE MORNING AND IN THE EVENING WITH MEALS 10/29/21   Saguier, Ramon Dredge, PA-C  metoprolol succinate (TOPROL-XL) 50 MG 24 hr tablet Take 1 tablet (50 mg total) by mouth daily. Take with or immediately following a meal. 07/03/21   Shelva Majestic, MD  promethazine (PHENERGAN) 25 MG tablet Take 1 tablet (25 mg total) by mouth every 6 (six) hours as needed for nausea or vomiting.  01/01/22   Shelva Majestic, MD  ticagrelor (BRILINTA) 90 MG TABS tablet Take 1 tablet (90 mg total) by mouth 2 (two) times daily. 11/03/20   Saguier, Ramon Dredge, PA-C  triamterene-hydrochlorothiazide (MAXZIDE-25) 37.5-25 MG tablet Take 1 tablet by mouth daily. 06/04/21 01/01/22  Shelva Majestic, MD                                                                                                                                    Allergies Elavil [amitriptyline hcl]  Review of Systems Review of Systems As noted in HPI  Physical Exam Vital Signs  I have reviewed the triage vital signs BP (!)  122/99   Pulse 63   Temp 98.2 F (36.8 C) (Oral)   Resp 14   Ht 5\' 7"  (1.702 m)   Wt 74.4 kg   SpO2 99%   BMI 25.69 kg/m   Physical Exam Vitals reviewed.  Constitutional:      General: She is not in acute distress.    Appearance: She is well-developed. She is not diaphoretic.  HENT:     Head: Normocephalic and atraumatic.     Nose: Nose normal.  Eyes:     General: No scleral icterus.       Right eye: No discharge.        Left eye: No discharge.     Conjunctiva/sclera: Conjunctivae normal.     Pupils: Pupils are equal, round, and reactive to light.  Cardiovascular:     Rate and Rhythm: Normal rate and regular rhythm.     Heart sounds: No murmur heard.    No friction rub. No gallop.  Pulmonary:     Effort: Pulmonary effort is normal. No respiratory distress.     Breath sounds: Normal breath sounds. No stridor. No rales.  Abdominal:     General: There is no distension.     Palpations: Abdomen is soft.     Tenderness: There is no abdominal tenderness.  Musculoskeletal:        General: No tenderness.     Cervical back: Normal range of motion and neck supple.  Skin:    General: Skin is warm and dry.     Findings: No erythema or rash.  Neurological:     Mental Status: She is alert and oriented to person, place, and time.     ED Results and Treatments Labs (all labs ordered are listed, but only abnormal results are displayed) Labs Reviewed  BASIC METABOLIC PANEL - Abnormal; Notable for the following components:      Result Value   Glucose, Bld 104 (*)    BUN 23 (*)    Creatinine, Ser 1.03 (*)    All other components within normal limits  CBC - Abnormal; Notable for the following components:   WBC 3.6 (*)    Hemoglobin 11.7 (*)    All other components within normal limits  TROPONIN I (  HIGH SENSITIVITY)  TROPONIN I (HIGH SENSITIVITY)                                                                                                                         EKG  EKG  Interpretation  Date/Time:  Saturday January 02 2022 05:16:23 EDT Ventricular Rate:  65 PR Interval:  198 QRS Duration: 88 QT Interval:  386 QTC Calculation: 401 R Axis:   65 Text Interpretation: Normal sinus rhythm Normal ECG When compared with ECG of 29-Feb-2020 15:18, No significant change was found Confirmed by Drema Pry [phone]) on 01/02/2022 5:50:38 AM       Radiology DG Chest 2 View  Result Date: 01/02/2022 CLINICAL DATA:  Chest pain. EXAM: CHEST - 2 VIEW COMPARISON:  02/22/2020 FINDINGS: The lungs are clear without focal pneumonia, edema, pneumothorax or pleural effusion. The cardiopericardial silhouette is within normal limits for size. The visualized bony structures of the thorax are unremarkable. IMPRESSION: No active cardiopulmonary disease. Electronically Signed   By: Kennith Center M.D.   On: 01/02/2022 05:55    Pertinent labs & imaging results that were available during my care of the patient were reviewed by me and considered in my medical decision making (see MDM for details).  Medications Ordered in ED Medications  fentaNYL (SUBLIMAZE) injection 25 mcg (25 mcg Intravenous Given 01/02/22 3710)                                                                                                                                     Procedures Procedures  (including critical care time)  Medical Decision Making / ED Course    Complexity of Problem:  Co-morbidities/SDOH that complicate the patient evaluation/care: Noted above in HPI  Additional history obtained: CMP from yesterday noted a potassium of 6.2 without renal insufficiency.  Patient's presenting problem/concern, DDX, and MDM listed below: Abnormal labs Patient is not on any potassium sparing medication and has not taken any over-the-counter meds.  She did not have any renal insufficiency on her metabolic panel drawn yesterday so I suspect her hyperkalemia is related to hemolysis. We will repeat BMP Chest  pain Atypical for ACS and likely related to stress due to the call at 5:00 in the morning regarding her abnormal labs. However given the patient's past medical history will obtain cardiac work-up.    Complexity of Data:   Cardiac Monitoring: The patient was maintained  on a cardiac monitor.   I personally viewed and interpreted the cardiac monitored which showed an underlying rhythm of normal sinus rhythm EKG without acute ischemic changes or evidence of pericarditis.  No peaked T waves.  Laboratory Tests ordered listed below with my independent interpretation: CBC without leukocytosis.  Mild anemia No significant electrolyte derangements with normal potassium at 3.8. Initial troponin negative   Imaging Studies ordered listed below with my independent interpretation: Chest x-ray without evidence of pneumonia pneumothorax     ED Course:    Assessment, Add'l Intervention, and Reassessment: Abnormal lab Hypokalemia due to hemolysis.   Chest pain Atypical for ACS.  Initial cardiac work-up was reassuring.  After patient was given her potassium results, she felt much better and was asking to go home.  She did agree to stay for second troponin. If the second troponin is negative I feel that this is sufficient to rule out ACS. I have low suspicion for pulmonary embolism, aortic dissection or esophageal perforation..  Final Clinical Impression(s) / ED Diagnoses Final diagnoses:  Chest discomfort           This chart was dictated using voice recognition software.  Despite best efforts to proofread,  errors can occur which can change the documentation meaning.    Nira Conn, MD 01/02/22 [phone]

## 2022-01-02 NOTE — ED Triage Notes (Signed)
  Patient comes in with chest pain that started around 0445.  Patient was called with lab results from physical yesterday and told potassium was 6.2, and to come be evaluated.  Patient states after the phone call she started feeling discomfort in L chest/shoulder.  No diaphoresis, or N/V.  No jaw pain.  Pain 5/10, tingling.  Patient states she has anxiety and feels worked up after receiving the phone call.

## 2022-01-04 ENCOUNTER — Telehealth: Payer: Self-pay | Admitting: Family Medicine

## 2022-01-04 LAB — CBC WITH DIFFERENTIAL/PLATELET
Absolute Monocytes: 390 cells/uL (ref 200–950)
Basophils Absolute: 52 cells/uL (ref 0–200)
Basophils Relative: 0.8 %
Eosinophils Absolute: 91 cells/uL (ref 15–500)
Eosinophils Relative: 1.4 %
HCT: 43 % (ref 35.0–45.0)
Hemoglobin: 14.6 g/dL (ref 11.7–15.5)
Lymphs Abs: 2119 cells/uL (ref 850–3900)
MCH: 30.8 pg (ref 27.0–33.0)
MCHC: 34 g/dL (ref 32.0–36.0)
MCV: 90.7 fL (ref 80.0–100.0)
MPV: 10.7 fL (ref 7.5–12.5)
Monocytes Relative: 6 %
Neutro Abs: 3848 cells/uL (ref 1500–7800)
Neutrophils Relative %: 59.2 %
Platelets: 237 10*3/uL (ref 140–400)
RBC: 4.74 10*6/uL (ref 3.80–5.10)
RDW: 12.5 % (ref 11.0–15.0)
Total Lymphocyte: 32.6 %
WBC: 6.5 10*3/uL (ref 3.8–10.8)

## 2022-01-04 LAB — COMPREHENSIVE METABOLIC PANEL
AG Ratio: 2.1 (calc) (ref 1.0–2.5)
ALT: 12 U/L (ref 6–29)
AST: 16 U/L (ref 10–35)
Albumin: 4.9 g/dL (ref 3.6–5.1)
Alkaline phosphatase (APISO): 62 U/L (ref 37–153)
BUN: 19 mg/dL (ref 7–25)
CO2: 23 mmol/L (ref 20–32)
Calcium: 10.2 mg/dL (ref 8.6–10.4)
Chloride: 105 mmol/L (ref 98–110)
Creat: 0.88 mg/dL (ref 0.50–1.03)
Globulin: 2.3 g/dL (calc) (ref 1.9–3.7)
Glucose, Bld: 96 mg/dL (ref 65–99)
Potassium: 6.2 mmol/L (ref 3.5–5.3)
Sodium: 141 mmol/L (ref 135–146)
Total Bilirubin: 0.7 mg/dL (ref 0.2–1.2)
Total Protein: 7.2 g/dL (ref 6.1–8.1)

## 2022-01-04 LAB — LIPID PANEL
Cholesterol: 301 mg/dL — ABNORMAL HIGH (ref <200)
HDL: 81 mg/dL (ref >=50)
LDL Cholesterol (Calc): 194 mg/dL (calc) — ABNORMAL HIGH
Non-HDL Cholesterol (Calc): 220 mg/dL (calc) — ABNORMAL HIGH (ref <130)
Total CHOL/HDL Ratio: 3.7 (calc) (ref <5.0)
Triglycerides: 119 mg/dL (ref <150)

## 2022-01-04 LAB — HEMOGLOBIN A1C
Hgb A1c MFr Bld: 5.4 % of total Hgb (ref <5.7)
Mean Plasma Glucose: 108 mg/dL
eAG (mmol/L): 6 mmol/L

## 2022-01-04 LAB — HIV ANTIBODY (ROUTINE TESTING W REFLEX): HIV 1&2 Ab, 4th Generation: NONREACTIVE

## 2022-01-04 LAB — RPR: RPR Ser Ql: NONREACTIVE

## 2022-01-04 LAB — TSH: TSH: 0.95 mIU/L (ref 0.40–4.50)

## 2022-01-04 LAB — T4, FREE: Free T4: 1.3 ng/dL (ref 0.8–1.8)

## 2022-01-04 NOTE — Telephone Encounter (Signed)
Labs resulted and reviewed by Dr. Durene Cal.

## 2022-01-04 NOTE — Telephone Encounter (Signed)
Patient Name: Carla West Gender: Female DOB: [birth year] Age: 61 Y 11 M 11 D Return Phone Number: [phone] (Primary) Address: City/ State/ Zip:  [place] [facility] - Human resources officer [facility] [employer] Provider Tana Conch- MD Contact Type Call Who Is Calling Lab / Radiology [facility] Name [facility] Phone Number [phone] Lab Tech Name Duwayne Heck Lab Reference Number [id number] w Chief Complaint Lab Result (Critical or Stat) Call Type Lab Send to RN Reason for Call Report lab results Initial Comment Caller states critical Translation No Nurse Assessment Nurse: Katrinka Blazing, RN, Laney Potash Date/Time (Eastern Time): 01/02/2022 4:24:51 AM Is there an on-call provider listed? ---Yes Please list name of person reporting value (Lab Employee) and a contact number. ---KGYJE [phone](VZCHYIF) Please document the following items: Lab name Lab value (read back to lab to verify) Reference range for lab value Date and time blood was drawn Collect time of birth for bilirubin results ---Potassium 6.2 (reference lab 3.5-5.3) collected 01/01/2022 at 431pm. Previous lab 07/03/2021 Potassium 3.9. Verified by repeat analysis Please collect the patient contact information from the lab. (name, phone number and address) ---Unknown Foley [phone] Disp. Time [name] Time) Disposition Final User 01/02/2022 4:38:55 AM Called On-Call Provider Katrinka Blazing, RN, [name] 01/02/2022 4:48:47 AM Clinical Call Yes Katrinka Blazing RN, [name] Final Disposition 01/02/2022 4:48:47 AM Clinical Call Yes Katrinka Blazing, RN, [name] Comments User: Francena Hanly, RN Date/Time [name] Time): 01/02/2022 4:38:12 AM  Comments Non-hemolyzed User: Francena Hanly, RN Date/Time [name] Time): 01/02/2022 4:48:36 AM Spoke with caller and advised of OCP recommendations Paging DoctorName Phone DateTime Result/ Outcome Message Type Notes Abbe Amsterdam- MD  [phone] 01/02/2022 4:38:55 AM Called On Call Provider - Reached Doctor Paged Abbe Amsterdam- MD 01/02/2022 4:40:49 AM Spoke with On Call - General Message Result Spoke with Shellee Milo- MD, per [facility] contact patient to see if patient has any CKD or other illness that woulld cause issues with Potassium. If not have patient go to ED.

## 2022-01-05 LAB — URINE CYTOLOGY ANCILLARY ONLY
Chlamydia: NEGATIVE
Comment: NEGATIVE
Comment: NEGATIVE
Comment: NORMAL
Neisseria Gonorrhea: NEGATIVE
Trichomonas: NEGATIVE

## 2022-01-08 ENCOUNTER — Encounter: Payer: Self-pay | Admitting: Family Medicine

## 2022-01-13 ENCOUNTER — Ambulatory Visit (INDEPENDENT_AMBULATORY_CARE_PROVIDER_SITE_OTHER): Payer: 59

## 2022-01-13 ENCOUNTER — Ambulatory Visit: Payer: 59 | Attending: Family Medicine

## 2022-01-13 DIAGNOSIS — Z23 Encounter for immunization: Secondary | ICD-10-CM | POA: Diagnosis not present

## 2022-01-13 DIAGNOSIS — R002 Palpitations: Secondary | ICD-10-CM

## 2022-01-24 ENCOUNTER — Other Ambulatory Visit: Payer: Self-pay | Admitting: Medical

## 2022-02-02 ENCOUNTER — Ambulatory Visit (INDEPENDENT_AMBULATORY_CARE_PROVIDER_SITE_OTHER): Payer: 59 | Admitting: Family Medicine

## 2022-02-02 ENCOUNTER — Encounter: Payer: Self-pay | Admitting: Family Medicine

## 2022-02-02 VITALS — BP 112/78 | HR 57 | Temp 98.1°F | Ht 67.0 in | Wt 166.6 lb

## 2022-02-02 DIAGNOSIS — E0590 Thyrotoxicosis, unspecified without thyrotoxic crisis or storm: Secondary | ICD-10-CM | POA: Diagnosis not present

## 2022-02-02 DIAGNOSIS — R002 Palpitations: Secondary | ICD-10-CM

## 2022-02-02 DIAGNOSIS — R198 Other specified symptoms and signs involving the digestive system and abdomen: Secondary | ICD-10-CM | POA: Diagnosis not present

## 2022-02-02 DIAGNOSIS — I10 Essential (primary) hypertension: Secondary | ICD-10-CM | POA: Diagnosis not present

## 2022-02-02 DIAGNOSIS — R1032 Left lower quadrant pain: Secondary | ICD-10-CM | POA: Diagnosis not present

## 2022-02-02 DIAGNOSIS — R635 Abnormal weight gain: Secondary | ICD-10-CM

## 2022-02-02 NOTE — Patient Instructions (Addendum)
Flu shot- we should have these available within a month or two but please let us know if you get at outside pharmacy  We will call you within two weeks about your referral to CT scan of abdomen and pelvis. If you do not hear within 2 weeks, give [place] a call.   I will let you know when I get cardiology read on your cardiac reading  [facility] Schedule an appointment by calling [phone].   Recommended follow up: Return in about 6 months (around 08/05/2022) for followup or sooner if needed.Schedule b4 you leave.

## 2022-02-02 NOTE — Progress Notes (Signed)
Phone [phone] In person visit   Subjective:   Carla West is a 61 y.o. year old very pleasant female patient who presents for/with See problem oriented charting Chief Complaint  Patient presents with   Follow-up    Pt states she wants to know results of heart monitor.   Weight Gain    Pt c/o not losing weight even though working out.   Past Medical History-  Patient Active Problem List   Diagnosis Date Noted   CAD (coronary artery disease) 11/07/2020    Priority: High   Hyperthyroidism 02/26/2019    Priority: High   Essential hypertension 08/03/2015    Priority: High   Palpitations 04/11/2018    Priority: Medium    Hyperlipidemia 12/28/2017    Priority: Medium    Migraine headache 08/03/2015    Priority: Medium    Skin lesion 11/05/2020    Priority: Low   Abnormal Pap smear of cervix 08/03/2015    Priority: Low   Osteopenia of lumbar spine 12/07/2017    Medications- reviewed and updated Current Outpatient Medications  Medication Sig Dispense Refill   almotriptan (AXERT) 12.5 MG tablet 1 tab po at onset of ha. Repeat in 2 hours if needed. Max 2 tabs in 24 hours 30 tablet 1   amLODipine (NORVASC) 5 MG tablet Take 1 tablet (5 mg total) by mouth daily. 90 tablet 3   aspirin EC 81 MG tablet Take 1 tablet (81 mg total) by mouth daily. 90 tablet 3   atorvastatin (LIPITOR) 80 MG tablet Take 1 tablet (80 mg total) by mouth daily at 6 PM. 90 tablet 3   ezetimibe (ZETIA) 10 MG tablet Take 1 tablet (10 mg total) by mouth daily. 90 tablet 3   hydrOXYzine (ATARAX) 25 MG tablet 1 tab po q hs prn insomnia 90 tablet 1   losartan (COZAAR) 50 MG tablet TAKE 1 TABLET(50 MG) BY MOUTH DAILY 90 tablet 3   methimazole (TAPAZOLE) 10 MG tablet TAKE 1 TABLET(10 MG) BY MOUTH TWICE DAILY IN THE MORNING AND IN THE EVENING WITH MEALS 180 tablet 0   metoprolol succinate (TOPROL-XL) 50 MG 24 hr tablet Take 1 tablet (50 mg total) by mouth daily. Take with or immediately following a meal. 90  tablet 3   triamterene-hydrochlorothiazide (MAXZIDE-25) 37.5-25 MG tablet Take 1 tablet by mouth daily. 90 tablet 3   No current facility-administered medications for this visit.     Objective:  BP 112/78   Pulse (!) 57   Temp 98.1 F (36.7 C)   Ht 5\' 7"  (1.702 m)   Wt 166 lb 9.6 oz (75.6 kg)   SpO2 97%   BMI 26.09 kg/m  Gen: NAD, resting comfortably CV: RRR no murmurs rubs or gallops Lungs: CTAB no crackles, wheeze, rhonchi Abdomen: Mild firmness throughout abdomen/slightly distended-seems to be firmer in suprapubic and into left lower quadrant with some pain in this area  No rebound or guarding.  Ext: no edema Skin: warm, dry     Assessment and Plan   #Abdominal fullness/bloating S: Started about 4 months ago.  Blood work overall reassuring at last visit.  Regular bowel movement 3 times a day but that is normal for her.  Has noted continued growth of abdomen-we considered CT scan of the abdomen if failed to improve to evaluate for malignancy  Her weight is up 2 pounds despite exercising regularly 3x a week (increase for her and eating reasonably healthy for her as well . In 2021 was around 150  and in 2020 was  133- this is her highest weight since pregnancy. Still feeling bloating/fullness sensation in abdomen- holds hands on top of abdomen due to the pressure/fullness. Still 3 BMs a day- and up to date on cologuard 11/27/20 Wt Readings from Last 3 Encounters:  02/02/22 166 lb 9.6 oz (75.6 kg)  01/02/22 164 lb (74.4 kg)  01/01/22 164 lb 12.8 oz (74.8 kg)   A/P: Patient with 4 months of abdominal fullness/bloating/weight gain despite eating healthy and exercising regularly and thyroid medications being in normal levels-she also feels somewhat firm in suprapubic area and tenderness area-we thought it appropriate to complete CT scan to rule out potential malignancy as cause of the symptoms.  Blood work largely reassuring from last visit -Tubal ligation and postmenopausal so doubt  pregnancy-low yield pregnancy test  #/Anxiety/palpitations S: Has been getting high anxiety at work for 3 to 4 months and had palpitations along with this without chest pain or shortness of breath but did feel mildly sweaty and clammy-denied increased work stress/increased triggers.  Checked TSH and T4 last visit which were reassuring (on methimazole through Dr. Lonzo Cloud).  Already on metoprolol at baseline - Did cardiac monitor- she ended up pressing button several times- mailed it back in on Sunday- we are hopeful to get results soon. Did wear zio patch in 2021- largely reassuring with Dr. 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 to her though does not feel particular anxious A/P: Patient seems more open to her symptoms being related to anxiety but we would still like to get the results from her cardiac monitor back before making this determination-we discussed could be a few more weeks and I set a reminder to check on status in 2 weeks -leans towards worktin gwith therapist if cardiac workup reassuring -has not taken hydroxyzine in daytime- only before bed but wants to avoid -She we will schedule follow-up with cardiology with CAD history -For palpitations continue metoprolol which is also helpful for blood pressure  #Hypothyroidism S: Appears well controlled on methimazole 10 mg twice daily reported Lab Results  Component Value Date   TSH 0.95 01/01/2022  A/P: Doing well most recent labs but still encouraged follow-up with endocrinology-for now continue current medication  #Resistant hypertension S: medication: amlodipine 5 mg PM, losartan 50mg  AM, metoprolol 50mg  XR PM, triamterene-hctz 37.5-25mg  PM Home readings #s: Mainly 130s over 80 at home -normal renal arteries 03/12/20 A/P: Well-controlled-continue current medication  # Migraines-since cardiac disease thought largely related to thyrotoxicosis almotriptan has not been stopped-we  did briefly consider this today but thankfully is sparingly taking-reconsider next visit but would also want to avoid NSAIDs with resistant hypertension-would potentially need neurology consult  Recommended follow up: Return in about 6 months (around 08/05/2022) for followup or sooner if needed.Schedule b4 you leave. Future Appointments  Date Time Provider Department Center  08/05/2022  3:00 PM 08/07/2022, MD [facility]    Lab/Order associations:   ICD-10-CM   1. Left lower quadrant abdominal pain  R10.32 CT ABDOMEN PELVIS W CONTRAST    2. Abdominal fullness in left lower quadrant  R19.8 CT ABDOMEN PELVIS W CONTRAST    3. Essential hypertension  I10     4. Hyperthyroidism  E05.90     5. Palpitations  R00.2     6. Weight gain  R63.5      Return precautions advised.  08/07/2022, MD

## 2022-02-03 ENCOUNTER — Ambulatory Visit: Payer: 59 | Admitting: Family Medicine

## 2022-02-26 ENCOUNTER — Ambulatory Visit
Admission: RE | Admit: 2022-02-26 | Discharge: 2022-02-26 | Disposition: A | Discharge status: Home / Self Care | Payer: 59 | Source: Ambulatory Visit | Attending: Family Medicine | Admitting: Family Medicine

## 2022-02-26 DIAGNOSIS — Z0000 Encounter for general adult medical examination without abnormal findings: Secondary | ICD-10-CM

## 2022-02-26 DIAGNOSIS — Z1231 Encounter for screening mammogram for malignant neoplasm of breast: Secondary | ICD-10-CM

## 2022-03-02 ENCOUNTER — Inpatient Hospital Stay: Admission: RE | Admit: 2022-03-02 | Payer: 59 | Source: Ambulatory Visit

## 2022-03-03 ENCOUNTER — Ambulatory Visit: Payer: 59

## 2022-03-04 ENCOUNTER — Inpatient Hospital Stay: Admission: RE | Admit: 2022-03-04 | Payer: 59 | Source: Ambulatory Visit

## 2022-03-06 ENCOUNTER — Encounter: Payer: Self-pay | Admitting: Family Medicine

## 2022-03-08 ENCOUNTER — Encounter: Payer: Self-pay | Admitting: Family Medicine

## 2022-03-08 ENCOUNTER — Encounter: Payer: Self-pay | Admitting: *Deleted

## 2022-04-01 ENCOUNTER — Inpatient Hospital Stay: Admission: RE | Admit: 2022-04-01 | Payer: 59 | Source: Ambulatory Visit

## 2022-04-29 ENCOUNTER — Encounter: Payer: Self-pay | Admitting: Family Medicine

## 2022-05-27 ENCOUNTER — Encounter: Payer: Self-pay | Admitting: *Deleted

## 2022-05-28 ENCOUNTER — Ambulatory Visit (HOSPITAL_COMMUNITY)
Admission: EM | Admit: 2022-05-28 | Discharge: 2022-05-28 | Disposition: A | Discharge status: Home / Self Care | Payer: 59 | Attending: Emergency Medicine | Admitting: Emergency Medicine

## 2022-05-28 ENCOUNTER — Encounter (HOSPITAL_COMMUNITY): Payer: Self-pay | Admitting: *Deleted

## 2022-05-28 DIAGNOSIS — B349 Viral infection, unspecified: Secondary | ICD-10-CM

## 2022-05-28 DIAGNOSIS — R52 Pain, unspecified: Secondary | ICD-10-CM | POA: Diagnosis present

## 2022-05-28 DIAGNOSIS — Z1152 Encounter for screening for COVID-19: Secondary | ICD-10-CM | POA: Insufficient documentation

## 2022-05-28 DIAGNOSIS — G43909 Migraine, unspecified, not intractable, without status migrainosus: Secondary | ICD-10-CM | POA: Diagnosis not present

## 2022-05-28 DIAGNOSIS — R6883 Chills (without fever): Secondary | ICD-10-CM | POA: Diagnosis present

## 2022-05-28 LAB — RESP PANEL BY RT-PCR (FLU A&B, COVID) ARPGX2
Influenza A by PCR: POSITIVE — AB
Influenza B by PCR: NEGATIVE
SARS Coronavirus 2 by RT PCR: NEGATIVE

## 2022-05-28 MED ORDER — METOCLOPRAMIDE HCL 5 MG/ML IJ SOLN
INTRAMUSCULAR | Status: AC
  Filled 2022-05-28: qty 2

## 2022-05-28 MED ORDER — SUMATRIPTAN SUCCINATE 6 MG/0.5ML ~~LOC~~ SOLN
6.0000 mg | Freq: Once | SUBCUTANEOUS | Status: AC
  Administered 2022-05-28: 6 mg via SUBCUTANEOUS

## 2022-05-28 MED ORDER — DEXAMETHASONE SODIUM PHOSPHATE 10 MG/ML IJ SOLN
INTRAMUSCULAR | Status: AC
  Filled 2022-05-28: qty 1

## 2022-05-28 MED ORDER — SUMATRIPTAN SUCCINATE 25 MG PO TABS: 50.0000 mg | ORAL_TABLET | Freq: Every day | ORAL | 0 refills | Status: DC

## 2022-05-28 MED ORDER — METOCLOPRAMIDE HCL 5 MG/ML IJ SOLN
5.0000 mg | Freq: Once | INTRAMUSCULAR | Status: AC
  Administered 2022-05-28: 5 mg via INTRAMUSCULAR

## 2022-05-28 MED ORDER — DEXAMETHASONE SODIUM PHOSPHATE 10 MG/ML IJ SOLN
10.0000 mg | Freq: Once | INTRAMUSCULAR | Status: AC
  Administered 2022-05-28: 10 mg via INTRAMUSCULAR

## 2022-05-28 MED ORDER — SUMATRIPTAN SUCCINATE 6 MG/0.5ML ~~LOC~~ SOLN
SUBCUTANEOUS | Status: AC
  Filled 2022-05-28: qty 0.5

## 2022-05-28 MED ORDER — PROMETHAZINE HCL 12.5 MG PO TABS: 12.5000 mg | ORAL_TABLET | Freq: Four times a day (QID) | ORAL | 0 refills | Status: DC | PRN

## 2022-05-28 MED ORDER — KETOROLAC TROMETHAMINE 30 MG/ML IJ SOLN
INTRAMUSCULAR | Status: AC
  Filled 2022-05-28: qty 1

## 2022-05-28 MED ORDER — OSELTAMIVIR PHOSPHATE 75 MG PO CAPS: 75.0000 mg | ORAL_CAPSULE | Freq: Two times a day (BID) | ORAL | 0 refills | Status: DC

## 2022-05-28 MED ORDER — KETOROLAC TROMETHAMINE 30 MG/ML IJ SOLN
30.0000 mg | Freq: Once | INTRAMUSCULAR | Status: AC
  Administered 2022-05-28: 30 mg via INTRAMUSCULAR

## 2022-05-28 NOTE — ED Triage Notes (Signed)
Pt states last night she started with a migraine, then came chills, back pain, D/V, and she feels like her heart is racing. She did take a zofran about an hour ago at work. Pt states she feels like she is going to pass out.

## 2022-05-28 NOTE — Discharge Instructions (Addendum)
We will call you if any of your test results warrant a change in your plan of care.  You may view these test results on MyChart.   Tamiflu has been sent to the pharmacy, this is an antiviral medication for the flu, you will take 1 tablet every 12 hours.  Imitrex has been sent to the pharmacy, you may take 2 tablets upon onset of the migraine and repeat 2 hours later if necessary, please do not take more than 200 mg in a 24-hour period of time.  Phenergan has been sent to the pharmacy, you can take this every 6 hours as needed for nausea/vomiting, please do not operate any heavy machinery after taking this medicine.   As discussed, if you begin developing any concerning/new symptoms at home please go to the nearest emergency department.

## 2022-05-28 NOTE — ED Provider Notes (Signed)
[facility]    CSN: [id number] Arrival date & time: 05/28/22  1019      History   Chief Complaint Chief Complaint  Patient presents with   Migraine   Back Pain   Chills   Tachycardia   Diarrhea   Emesis    HPI Carla West is a 61 y.o. female.  Patient presents complaining of migraine, body aches, chills, and emesis that started last night.  Patient reports today that she feels lightheaded and nauseous.  Patient reports multiple episodes of emesis that occurred while she was on the toilet, she states that she also had bowel movements while she was throwing.  Patient denies any watery stool. Patient reports that she took 1 Zofran pill an hour before arrival which has helped her vomiting but she still feels nauseous.  She reports that she has been unable to intake fluid or food.  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  Patient reports a history of migraine.  Patient states that this migraine feels like her usual migraine, but worsened just due to her overall feeling unwell. Patient is sitting in dark room with head down, patient is ill-appearing upon arrival.    Migraine Associated symptoms include headaches. Pertinent negatives include no chest pain, no abdominal pain and no shortness of breath.  Back Pain Associated symptoms: fever and headaches   Associated symptoms: no abdominal pain, no chest pain, no numbness and no weakness   Diarrhea Associated symptoms: chills, fever, headaches, myalgias and vomiting   Associated symptoms: no abdominal pain and no diaphoresis   Emesis Associated symptoms: chills, cough, diarrhea, fever, headaches and myalgias   Associated symptoms: no abdominal pain     Past Medical History:  Diagnosis Date   Abnormal Pap smear of cervix 08/03/2015   Anxiety    Benign essential HTN 08/03/2015   Depression    Graves disease    History of chicken pox    Migraine headache 08/03/2015    Patient  Active Problem List   Diagnosis Date Noted   CAD (coronary artery disease) 11/07/2020   Skin lesion 11/05/2020   Hyperthyroidism 02/26/2019   Palpitations 04/11/2018   Hyperlipidemia 12/28/2017   Osteopenia of lumbar spine 12/07/2017   Essential hypertension 08/03/2015   Migraine headache 08/03/2015   Abnormal Pap smear of cervix 08/03/2015    Past Surgical History:  Procedure Laterality Date   CORONARY STENT INTERVENTION N/A 02/27/2019   Procedure: CORONARY STENT INTERVENTION;  Surgeon: Kathleene Hazel, MD;  Location: [facility];  Service: Cardiovascular;  Laterality: N/A;   LEFT HEART CATH AND CORONARY ANGIOGRAPHY N/A 02/27/2019   Procedure: LEFT HEART CATH AND CORONARY ANGIOGRAPHY;  Surgeon: Kathleene Hazel, MD;  Location: [facility];  Service: Cardiovascular;  Laterality: N/A;   TUBAL LIGATION      OB History   No obstetric history on file.      Home Medications    Prior to Admission medications   Medication Sig Start Date End Date Taking? Authorizing Provider  amLODipine (NORVASC) 5 MG tablet Take 1 tablet (5 mg total) by mouth daily. 07/03/21  Yes Shelva Majestic, MD  aspirin EC 81 MG tablet Take 1 tablet (81 mg total) by mouth daily. 01/29/19  Yes Georgeanna Lea, MD  atorvastatin (LIPITOR) 80 MG tablet Take 1 tablet (80 mg total) by mouth daily at 6 PM. 11/03/20  Yes Saguier, Ramon Dredge, PA-C  ezetimibe (ZETIA) 10 MG tablet  Take 1 tablet (10 mg total) by mouth daily. 06/04/21  Yes Shelva MajesticHunter, Stephen O, MD  hydrOXYzine (ATARAX) 25 MG tablet 1 tab po q hs prn insomnia 11/24/21  Yes Shelva MajesticHunter, Stephen O, MD  losartan (COZAAR) 50 MG tablet TAKE 1 TABLET(50 MG) BY MOUTH DAILY 01/01/22  Yes Shelva MajesticHunter, Stephen O, MD  methimazole (TAPAZOLE) 10 MG tablet TAKE 1 TABLET(10 MG) BY MOUTH TWICE DAILY IN THE MORNING AND IN THE EVENING WITH MEALS 01/25/22  Yes Saguier, Ramon DredgeEdward, PA-C  metoprolol succinate (TOPROL-XL) 50 MG 24 hr tablet Take 1 tablet (50 mg total) by  mouth daily. Take with or immediately following a meal. 07/03/21  Yes Shelva MajesticHunter, Stephen O, MD  oseltamivir (TAMIFLU) 75 MG capsule Take 1 capsule (75 mg total) by mouth every 12 (twelve) hours. 05/28/22  Yes Debby FreibergWedderburn, Neoma Uhrich N, NP  promethazine (PHENERGAN) 12.5 MG tablet Take 1 tablet (12.5 mg total) by mouth every 6 (six) hours as needed for nausea or vomiting. 05/28/22  Yes Debby FreibergWedderburn, Mayzie Caughlin N, NP  SUMAtriptan (IMITREX) 25 MG tablet Take 2 tablets (50 mg total) by mouth daily. May repeat in 2 hours if headache persists or recurs. Do not take more than 200 mg in a 24 hour period. 05/28/22  Yes Debby FreibergWedderburn, Skeeter Sheard N, NP  triamterene-hydrochlorothiazide (MAXZIDE-25) 37.5-25 MG tablet Take 1 tablet by mouth daily. 06/04/21 01/01/22  Shelva MajesticHunter, Stephen O, MD    Family History Family History  Problem Relation Age of Onset   Hypertension Mother    Heart disease Mother        in late 2360s   Hyperlipidemia Father    Stroke Father        later in life   Breast cancer Maternal Grandmother    Hyperlipidemia Sister    Other Brother        plan crash in [employer]   Migraines Son    Heart disease Brother        CHF, arrythmia   Liver disease Brother        alcohol   Hypertension Brother    Gout Brother    Hypertension Brother     Social History Social History   Tobacco Use   Smoking status: Never   Smokeless tobacco: Never  Vaping Use   Vaping Use: Never used  Substance Use Topics   Alcohol use: No   Drug use: No     Allergies   Elavil [amitriptyline hcl]   Review of Systems Review of Systems  Constitutional:  Positive for activity change, appetite change, chills, fatigue and fever. Negative for diaphoresis.  HENT: Negative.    Eyes: Negative.   Respiratory:  Positive for cough. Negative for choking, chest tightness, shortness of breath, wheezing and stridor.   Cardiovascular:  Negative for chest pain.       Reports heart is racing, no beats out of rhythm   Gastrointestinal:   Positive for diarrhea and vomiting. Negative for abdominal pain.  Endocrine: Negative.   Genitourinary: Negative.   Musculoskeletal:  Positive for back pain and myalgias.  Neurological:  Positive for light-headedness and headaches. Negative for seizures, syncope, facial asymmetry, speech difficulty, weakness and numbness.     Physical Exam Triage Vital Signs ED Triage Vitals  Enc Vitals Group     BP 05/28/22 1044 (!) 156/99     Pulse Rate 05/28/22 1044 85     Resp 05/28/22 1044 18     Temp 05/28/22 1044 99.5 F (37.5 C)     Temp src --  SpO2 05/28/22 1044 96 %     Weight --      Height --      Head Circumference --      Peak Flow --      Pain Score 05/28/22 1043 8     Pain Loc --      Pain Edu? --      Excl. in GC? --    No data found.  Updated Vital Signs BP (!) 156/99 (BP Location: Right Arm)   Pulse 85   Temp 99.5 F (37.5 C)   Resp 18   SpO2 96%     Physical Exam Vitals and nursing note reviewed.  Constitutional:      Appearance: Normal appearance. She is ill-appearing.  Cardiovascular:     Rate and Rhythm: Normal rate and regular rhythm.     Heart sounds: Normal heart sounds, S1 normal and S2 normal.  Pulmonary:     Effort: Pulmonary effort is normal.     Breath sounds: Normal breath sounds. No decreased breath sounds, wheezing, rhonchi or rales.  Abdominal:     General: Abdomen is flat. Bowel sounds are normal. There is no distension. There are no signs of injury.     Palpations: Abdomen is soft. There is no shifting dullness, fluid wave, hepatomegaly, splenomegaly, mass or pulsatile mass.     Tenderness: There is no abdominal tenderness. There is no guarding or rebound. Negative signs include McBurney's sign.  Neurological:     General: No focal deficit present.     Mental Status: She is alert.     GCS: GCS eye subscore is 4. GCS verbal subscore is 5. GCS motor subscore is 6.  Psychiatric:        Behavior: Behavior is cooperative.      UC  Treatments / Results  Labs (all labs ordered are listed, but only abnormal results are displayed) Labs Reviewed  RESP PANEL BY RT-PCR (FLU A&B, COVID) [id number]    EKG   Radiology No results found.  Procedures Procedures (including critical care time)  Medications Ordered in UC Medications  ketorolac (TORADOL) 30 MG/ML injection 30 mg (30 mg Intramuscular Given 05/28/22 1112)  metoCLOPramide (REGLAN) injection 5 mg (5 mg Intramuscular Given 05/28/22 1112)  dexamethasone (DECADRON) injection 10 mg (10 mg Intramuscular Given 05/28/22 1112)  SUMAtriptan (IMITREX) injection 6 mg (6 mg Subcutaneous Given 05/28/22 1113)    Initial Impression / Assessment and Plan / UC Course  I have reviewed the triage vital signs and the nursing notes.  Pertinent labs & imaging results that were available during my care of the patient were reviewed by me and considered in my medical decision making (see chart for details).     Patient was evaluated for migraine and viral illness. Patient is candidate for COVID antiviral if COVID test is positive .  High suspicion for influenza. Patient was given migraine cocktail while in office, patient experienced some relief of symptoms.  COVID and Flu test is pending.  Prescription for Tamiflu, promethazine, and Imitrex was given.  Patient was made aware of treatment regiment.  Patient was made aware of safety precautions with promethazine.  Patient was made aware of possible discontinuing of Tamiflu if influenza test is negative.  Patient was made aware of red flag symptoms that warrant an emergency department visit.  She was able to intake fluids after medications given in office, this writer deemed with discharge prescriptions patient was safe to go home.  Patient was educated extensively  that if things do not improve at home she will need to go to the emergency department. Patient made aware of timeline for symptom resolution and when follow-up would be necessary.   Patient made aware of results reporting protocol and MyChart.  Patient verbalized understanding of instructions.    Charting was provided using a a verbal dictation system, charting was proofread for errors, errors may occur which could change the meaning of the information charted.   Final Clinical Impressions(s) / UC Diagnoses   Final diagnoses:  Viral illness  Migraine without status migrainosus, not intractable, unspecified migraine type     Discharge Instructions      We will call you if any of your test results warrant a change in your plan of care.  You may view these test results on MyChart.   Tamiflu has been sent to the pharmacy, this is an antiviral medication for the flu, you will take 1 tablet every 12 hours.  Imitrex has been sent to the pharmacy, you may take 2 tablets upon onset of the migraine and repeat 2 hours later if necessary, please do not take more than 200 mg in a 24-hour period of time.  Phenergan has been sent to the pharmacy, you can take this every 6 hours as needed for nausea/vomiting, please do not operate any heavy machinery after taking this medicine.   As discussed, if you begin developing any concerning/new symptoms at home please go to the nearest emergency department.          ED Prescriptions     Medication Sig Dispense Auth. Provider   SUMAtriptan (IMITREX) 25 MG tablet Take 2 tablets (50 mg total) by mouth daily. May repeat in 2 hours if headache persists or recurs. Do not take more than 200 mg in a 24 hour period. 30 tablet Debby Freiberg, NP   oseltamivir (TAMIFLU) 75 MG capsule Take 1 capsule (75 mg total) by mouth every 12 (twelve) hours. 10 capsule Debby Freiberg, NP   promethazine (PHENERGAN) 12.5 MG tablet Take 1 tablet (12.5 mg total) by mouth every 6 (six) hours as needed for nausea or vomiting. 30 tablet Debby Freiberg, NP      PDMP not reviewed this encounter.   Debby Freiberg, NP 05/28/22 1253

## 2022-06-27 ENCOUNTER — Other Ambulatory Visit: Payer: Self-pay | Admitting: Family Medicine

## 2022-07-11 ENCOUNTER — Other Ambulatory Visit: Payer: Self-pay | Admitting: Family Medicine

## 2022-08-05 ENCOUNTER — Encounter: Payer: Self-pay | Admitting: Family Medicine

## 2022-08-05 ENCOUNTER — Ambulatory Visit (INDEPENDENT_AMBULATORY_CARE_PROVIDER_SITE_OTHER): Payer: 59 | Admitting: Family Medicine

## 2022-08-05 VITALS — BP 118/86 | HR 73 | Temp 97.8°F | Ht 67.0 in | Wt 167.2 lb

## 2022-08-05 DIAGNOSIS — E0590 Thyrotoxicosis, unspecified without thyrotoxic crisis or storm: Secondary | ICD-10-CM

## 2022-08-05 DIAGNOSIS — R739 Hyperglycemia, unspecified: Secondary | ICD-10-CM

## 2022-08-05 DIAGNOSIS — Z713 Dietary counseling and surveillance: Secondary | ICD-10-CM

## 2022-08-05 DIAGNOSIS — R4589 Other symptoms and signs involving emotional state: Secondary | ICD-10-CM

## 2022-08-05 DIAGNOSIS — I10 Essential (primary) hypertension: Secondary | ICD-10-CM | POA: Diagnosis not present

## 2022-08-05 MED ORDER — ESCITALOPRAM OXALATE 10 MG PO TABS: 10.0000 mg | ORAL_TABLET | Freq: Every day | ORAL | 5 refills | Status: DC

## 2022-08-05 NOTE — Progress Notes (Signed)
Phone [phone] In person visit   Subjective:   Carla West is a 62 y.o. year old very pleasant female patient who presents for/with See problem oriented charting Chief Complaint  Patient presents with   Follow-up    Pt states she has been doing a lot of crying and feeling overwhelmed she is unsure if its menopause   Hypertension   Hyperthyroidism   Past Medical History-  Patient Active Problem List   Diagnosis Date Noted   CAD (coronary artery disease) 11/07/2020    Priority: High   Hyperthyroidism 02/26/2019    Priority: High   Essential hypertension 08/03/2015    Priority: High   Hyperglycemia 08/05/2022    Priority: Medium    Palpitations 04/11/2018    Priority: Medium    Hyperlipidemia 12/28/2017    Priority: Medium    Migraine headache 08/03/2015    Priority: Medium    Skin lesion 11/05/2020    Priority: Low   Abnormal Pap smear of cervix 08/03/2015    Priority: Low   Osteopenia of lumbar spine 12/07/2017    Medications- reviewed and updated Current Outpatient Medications  Medication Sig Dispense Refill   amLODipine (NORVASC) 5 MG tablet Take 1 tablet (5 mg total) by mouth daily. 90 tablet 3   aspirin EC 81 MG tablet Take 1 tablet (81 mg total) by mouth daily. 90 tablet 3   atorvastatin (LIPITOR) 80 MG tablet Take 1 tablet (80 mg total) by mouth daily at 6 PM. 90 tablet 3   escitalopram (LEXAPRO) 10 MG tablet Take 1 tablet (10 mg total) by mouth daily. 30 tablet 5   ezetimibe (ZETIA) 10 MG tablet TAKE 1 TABLET(10 MG) BY MOUTH DAILY 90 tablet 3   hydrOXYzine (ATARAX) 25 MG tablet 1 tab po q hs prn insomnia 90 tablet 1   losartan (COZAAR) 50 MG tablet TAKE 1 TABLET(50 MG) BY MOUTH DAILY 90 tablet 3   methimazole (TAPAZOLE) 10 MG tablet TAKE 1 TABLET(10 MG) BY MOUTH TWICE DAILY IN THE MORNING AND IN THE EVENING WITH MEALS 180 tablet 0   metoprolol succinate (TOPROL-XL) 50 MG 24 hr tablet TAKE 1 TABLET(50 MG) BY MOUTH DAILY WITH OR IMMEDIATELY FOLLOWING A  MEAL 90 tablet 3   promethazine (PHENERGAN) 12.5 MG tablet Take 1 tablet (12.5 mg total) by mouth every 6 (six) hours as needed for nausea or vomiting. 30 tablet 0   SUMAtriptan (IMITREX) 25 MG tablet Take 2 tablets (50 mg total) by mouth daily. May repeat in 2 hours if headache persists or recurs. Do not take more than 200 mg in a 24 hour period. 30 tablet 0   triamterene-hydrochlorothiazide (MAXZIDE-25) 37.5-25 MG tablet TAKE 1 TABLET BY MOUTH DAILY 90 tablet 0   No current facility-administered medications for this visit.     Objective:  BP 118/86   Pulse 73   Temp 97.8 F (36.6 C)   Ht 5' 7"$  (1.702 m)   Wt 167 lb 3.2 oz (75.8 kg)   SpO2 99%   BMI 26.19 kg/m  Gen: NAD, resting comfortably     Assessment and Plan   # Depressed mood/tearfulness S: Medication:none  -has been dealing with crying spells starting after Christmas - doing a lot of cleaning- but not staying up all night clearning -feels has already mourned brother and parents.  -has not seasonal issues in the past -doesn't drink milk, not much sun exposure -does not act outwardly sad in front of others -not sleeping well and struggling with exercise  due to fatigue  -working a lot of hours and even working weekends    08/05/2022    2:47 PM 01/01/2022    3:52 PM 11/07/2020    3:37 PM  Depression screen PHQ 2/9  Decreased Interest 2 0 0  Down, Depressed, Hopeless 2 0 0  PHQ - 2 Score 4 0 0  Altered sleeping 2  2  Tired, decreased energy 2  1  Change in appetite 2  1  Feeling bad or failure about yourself  0  0  Trouble concentrating 2  0  Moving slowly or fidgety/restless 0  0  Suicidal thoughts 0  0  PHQ-9 Score 12  4  Difficult doing work/chores Not difficult at all  Not difficult at all      08/05/2022    2:48 PM 11/07/2020    3:38 PM  GAD 7 : Generalized Anxiety Score  Nervous, Anxious, on Edge 1 1  Control/stop worrying 1 2  Worry too much - different things 1 2  Trouble relaxing 1 3  Restless 0 0   Easily annoyed or irritable 0 0  Afraid - awful might happen 1 0  Total GAD 7 Score 5 8  Anxiety Difficulty Not difficult at all Not difficult at all  A/P: depressed mood of unclear cause- still highly functioning but we would like to uncover what is causing this.   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 ShKelton Pillar: compliant On thyroid medication-methimazole 1063mID  listed but taking once a day.  - RAI or surgery in active consideration  -Could contribute to hypertension issues Lab Results  Component Value Date   TSH 0.95 01/01/2022   A/P: Only taking methimazole once a day-update TSH and free T4 for further evaluation  # Hyperglycemia/insulin resistance/prediabetes- peak a1c of 5.9 S:  Medication: none Lab Results  Component Value Date   HGBA1C 5.4 01/01/2022   HGBA1C 5.4 01/01/2022   HGBA1C 5.9 (H) 07/03/2021  A/P: Since we are updating blood work we opted to check A1c  Recommended follow up: Return in about 6 weeks (around 09/16/2022) for followup or sooner  if needed.Schedule b4 you leave. No future appointments.  Lab/Order associations:   ICD-10-CM   1. Depressed mood  R45.89     2. Dietary restriction  Z71.3 VITAMIN D 25 Hydroxy (Vit-D Deficiency, Fractures)    3. Hyperthyroidism  E05.90 TSH    T4, free    4. Essential hypertension  I10 CBC with Differential/Platelet    Comprehensive metabolic panel    5. Hyperglycemia  R73.9 Hemoglobin A1c      Meds ordered this encounter  Medications  escitalopram (LEXAPRO) 10 MG tablet    Sig: Take 1 tablet (10 mg total) by mouth daily.    Dispense:  30 tablet    Refill:  5    Return precautions advised.  Garret Reddish, MD

## 2022-08-05 NOTE — Patient Instructions (Addendum)
Please stop by lab before you go If you have mychart- we will send your results within 3 business days of [place] receiving them.  If you do not have mychart- we will call you about results within 5 business days of [place] receiving them.  *please also note that you will see labs on mychart as soon as they post. I will later go in and write notes on them- will say "notes from Dr. Yong Channel"   -could look into EMDR or eye spotting therapy types- not traditional talk therapy -Ruthless elimination of hurry -cut screens off minimum 30 minutes before bed - can read etc after that   Taking the medicine escitalopram 10 mg (but only take 5 mg or half tablet first week) as directed and not missing any doses is one of the best things you can do to treat your depressed mood.  Here are some things to keep in mind:  Side effects (stomach upset, some increased anxiety) may happen before you notice a benefit.  These side effects typically go away over time. Changes to your dose of medicine or a change in medication all together is sometimes necessary Most people need to be on medication at least 6-12 months Many people will notice an improvement within two weeks but the full effect of the medication can take up to 6 weeks Stopping the medication when you start feeling better often results in a return of symptomsj If you start having thoughts of hurting yourself or others after starting this medicine, call our office immediately at [phone] or seek care through 911 or [phone]  Recommended follow up: Return in about 6 weeks (around 09/16/2022) for followup or sooner if needed.Schedule b4 you leave.

## 2022-08-06 ENCOUNTER — Other Ambulatory Visit: Payer: Self-pay | Admitting: Family Medicine

## 2022-08-06 LAB — CBC WITH DIFFERENTIAL/PLATELET
Basophils Absolute: 0 10*3/uL (ref 0.0–0.1)
Basophils Relative: 1.2 % (ref 0.0–3.0)
Eosinophils Absolute: 0 10*3/uL (ref 0.0–0.7)
Eosinophils Relative: 1.1 % (ref 0.0–5.0)
HCT: 36.6 % (ref 36.0–46.0)
Hemoglobin: 12.1 g/dL (ref 12.0–15.0)
Lymphocytes Relative: 34.8 % (ref 12.0–46.0)
Lymphs Abs: 1.5 10*3/uL (ref 0.7–4.0)
MCHC: 33.2 g/dL (ref 30.0–36.0)
MCV: 85.9 fl (ref 78.0–100.0)
Monocytes Absolute: 0.4 10*3/uL (ref 0.1–1.0)
Monocytes Relative: 9.2 % (ref 3.0–12.0)
Neutro Abs: 2.3 10*3/uL (ref 1.4–7.7)
Neutrophils Relative %: 53.7 % (ref 43.0–77.0)
Platelets: 276 10*3/uL (ref 150.0–400.0)
RBC: 4.26 Mil/uL (ref 3.87–5.11)
RDW: 14.5 % (ref 11.5–15.5)
WBC: 4.3 10*3/uL (ref 4.0–10.5)

## 2022-08-06 LAB — COMPREHENSIVE METABOLIC PANEL
ALT: 12 U/L (ref 0–35)
AST: 15 U/L (ref 0–37)
Albumin: 4.1 g/dL (ref 3.5–5.2)
Alkaline Phosphatase: 69 U/L (ref 39–117)
BUN: 20 mg/dL (ref 6–23)
CO2: 26 mEq/L (ref 19–32)
Calcium: 9.3 mg/dL (ref 8.4–10.5)
Chloride: 106 mEq/L (ref 96–112)
Creatinine, Ser: 1.46 mg/dL — ABNORMAL HIGH (ref 0.40–1.20)
GFR: 39.15 mL/min — ABNORMAL LOW (ref >60.00)
Glucose, Bld: 108 mg/dL — ABNORMAL HIGH (ref 70–99)
Potassium: 3.6 mEq/L (ref 3.5–5.1)
Sodium: 142 mEq/L (ref 135–145)
Total Bilirubin: 0.3 mg/dL (ref 0.2–1.2)
Total Protein: 6.7 g/dL (ref 6.0–8.3)

## 2022-08-06 LAB — VITAMIN D 25 HYDROXY (VIT D DEFICIENCY, FRACTURES): VITD: 11.21 ng/mL — ABNORMAL LOW (ref 30.00–100.00)

## 2022-08-06 LAB — HEMOGLOBIN A1C: Hgb A1c MFr Bld: 6 % (ref 4.6–6.5)

## 2022-08-06 LAB — T4, FREE: Free T4: 1.03 ng/dL (ref 0.60–1.60)

## 2022-08-06 LAB — TSH: TSH: 1.14 u[IU]/mL (ref 0.35–5.50)

## 2022-08-06 MED ORDER — VITAMIN D (ERGOCALCIFEROL) 1.25 MG (50000 UNIT) PO CAPS: 50000.0000 [IU] | ORAL_CAPSULE | ORAL | 1 refills | Status: DC

## 2022-08-16 ENCOUNTER — Ambulatory Visit: Payer: 59 | Admitting: Family Medicine

## 2022-08-16 ENCOUNTER — Encounter: Payer: Self-pay | Admitting: Family Medicine

## 2022-08-16 ENCOUNTER — Ambulatory Visit (INDEPENDENT_AMBULATORY_CARE_PROVIDER_SITE_OTHER): Payer: 59

## 2022-08-16 ENCOUNTER — Ambulatory Visit: Payer: Self-pay

## 2022-08-16 VITALS — BP 126/90 | HR 66 | Ht 67.0 in | Wt 168.0 lb

## 2022-08-16 DIAGNOSIS — M25512 Pain in left shoulder: Secondary | ICD-10-CM | POA: Diagnosis not present

## 2022-08-16 DIAGNOSIS — M5412 Radiculopathy, cervical region: Secondary | ICD-10-CM

## 2022-08-16 MED ORDER — PREDNISONE 50 MG PO TABS: 50.0000 mg | ORAL_TABLET | Freq: Every day | ORAL | 0 refills | Status: DC

## 2022-08-16 MED ORDER — GABAPENTIN 100 MG PO CAPS: 100.0000 mg | ORAL_CAPSULE | Freq: Three times a day (TID) | ORAL | 3 refills | Status: DC | PRN

## 2022-08-16 MED ORDER — TIZANIDINE HCL 2 MG PO TABS: 2.0000 mg | ORAL_TABLET | Freq: Three times a day (TID) | ORAL | 1 refills | Status: DC | PRN

## 2022-08-16 NOTE — Patient Instructions (Addendum)
Thank you for coming in today.   I've sent those prescription to your pharmacy.   Try using a heating pad  Please get an Xray today before you leave   I've referred you to Physical Therapy.  Let us know if you don't hear from them in one week.   Check back in 1 month

## 2022-08-16 NOTE — Progress Notes (Unsigned)
Shirlyn Goltz, PhD, LAT, ATC acting as a scribe for [name], MD.  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 RHD. Pt locates pain to posterior aspect, radiating down the arm to the wrist and into the axillary region. She should feels warm to the touch. Has dx of migraines and HTN.   Radiates: L UE UE Numbness/tingling: yes UE Weakness: mild Aggravates: looking to the left, looking up and down, causing night disturbance Treatments tried: IBU  Pertinent review of Systems: No fevers or chills  Relevant historical information: Migraines and hypertension.  CAD.   Objective:    Vitals:   08/16/22 1446  BP: (!) 126/90  Pulse: 66  SpO2: 98%   General: Well Developed, well nourished, and in no acute distress.   MSK:  C-spine: Normal appearing. Nontender palpation cervical midline.  Tender palpation left trapezius Decreased cervical motion. Positive left-sided Spurling's test. Upper extremity strength decreased abduction otherwise intact.  Left shoulder: Normal-appearing Decreased range of motion abduction limited to about 120 degrees.  External and internal rotation range of motion are full. Strength decreased abduction 4/5.  External/internal rotation strength are intact. Positive Hawkins and Neer's test.  Positive empty can test. Negative Yergason's and speeds test.  Lab and Radiology Results  X-ray images left shoulder and C-spine obtained today personally independently interpreted  C-spine: Mild DDD and anterior listhesis at C7-T1.  DDD also present at C4-5  Left shoulder: Mild AC DJD.  No acute fractures.  Await formal radiology review   Impression and Recommendations:    Assessment and Plan: 62 y.o. female with left shoulder and arm pain.  Multifactorial.  She has physical exam findings consistent with  both cervical radiculopathy and left shoulder rotator cuff tendinitis/impingement.  Additionally she has some characteristics for trapezius muscle spasm.  She is a good candidate for trial of physical therapy.  Will use prednisone now and either tizanidine or gabapentin at bedtime.  I prescribed low doses of both of these medicines and she can adjust them and see which works best. Forensic scientist in 1 month.  PDMP not reviewed this encounter. Orders Placed This Encounter  Procedures   DG Cervical Spine 2 or 3 views    Standing Status:   Future    Number of Occurrences:   1    Standing Expiration Date:   08/16/2023    Order Specific Question:   Reason for Exam (SYMPTOM  OR DIAGNOSIS REQUIRED)    Answer:   eval left cervic radiculopathy    Order Specific Question:   Is patient pregnant?    Answer:   No    Order Specific Question:   Preferred imaging location?    Answer:   [facility]   DG Shoulder Left    Standing Status:   Future    Number of Occurrences:   1    Standing Expiration Date:   08/16/2023    Order Specific Question:   Reason for Exam (SYMPTOM  OR DIAGNOSIS REQUIRED)    Answer:   eval left shouler pain    Order Specific Question:   Is patient pregnant?    Answer:   No    Order Specific Question:   Preferred imaging location?    Answer:   [facility]   Ambulatory referral to Physical Therapy    Referral Priority:  Routine    Referral Type:   Physical Medicine    Referral Reason:   Specialty Services Required    Requested Specialty:   Physical Therapy    Number of Visits Requested:   1   Meds ordered this encounter  Medications   predniSONE (DELTASONE) 50 MG tablet    Sig: Take 1 tablet (50 mg total) by mouth daily.    Dispense:  5 tablet    Refill:  0   tiZANidine (ZANAFLEX) 2 MG tablet    Sig: Take 1-2 tablets (2-4 mg total) by mouth every 8 (eight) hours as needed for muscle spasms (Mostly at bedtime).    Dispense:  60 tablet    Refill:  1   gabapentin  (NEURONTIN) 100 MG capsule    Sig: Take 1-3 capsules (100-300 mg total) by mouth 3 (three) times daily as needed (nerve pain.). Take mostly at bedtime    Dispense:  30 capsule    Refill:  3    Discussed warning signs or symptoms. Please see discharge instructions. Patient expresses understanding.   The above documentation has been reviewed and is accurate and complete [name], M.D.

## 2022-08-18 NOTE — Progress Notes (Signed)
Cervical spine x-ray shows mild arthritis changes.

## 2022-08-18 NOTE — Progress Notes (Signed)
Left shoulder x-ray shows arthritis changes

## 2022-08-30 ENCOUNTER — Other Ambulatory Visit: Payer: Self-pay | Admitting: Family Medicine

## 2022-08-30 NOTE — Telephone Encounter (Signed)
Just refilled 08/16/22 #60/1  Start Date & Time: 03/04/2024Ord/Sold: 08/16/2022 (O)Ordered On: 08/16/2022

## 2022-08-30 NOTE — Telephone Encounter (Signed)
Refilled 08/16/22 #60/1

## 2022-09-09 NOTE — Progress Notes (Deleted)
   Shirlyn Goltz, PhD, LAT, ATC acting as a scribe for [name], MD.  Carla West is a 62 y.o. female who presents to [place] [facility] today for 1 month follow-up left shoulder pain and cervical radiculopathy.  Patient was last seen by Dr. Georgina Snell on 08/16/2022 and was prescribed prednisone advised to use tizanidine or gabapentin at bedtime and was referred to [facility]. Today, patient reports ***  Dx imaging: 08/16/2022 L shoulder & C-spine x-ray  Pertinent review of systems: ***  Relevant historical information: ***   Exam:  There were no vitals taken for this visit. General: Well Developed, well nourished, and in no acute distress.   MSK: ***    Lab and Radiology Results No results found for this or any previous visit (from the past 72 hour(s)). No results found.     Assessment and Plan: 62 y.o. female with ***   PDMP not reviewed this encounter. No orders of the defined types were placed in this encounter.  No orders of the defined types were placed in this encounter.    Discussed warning signs or symptoms. Please see discharge instructions. Patient expresses understanding.   ***

## 2022-09-13 ENCOUNTER — Ambulatory Visit: Payer: 59 | Admitting: Family Medicine

## 2022-09-16 ENCOUNTER — Ambulatory Visit (INDEPENDENT_AMBULATORY_CARE_PROVIDER_SITE_OTHER): Payer: 59 | Admitting: Family Medicine

## 2022-09-16 ENCOUNTER — Encounter: Payer: Self-pay | Admitting: Family Medicine

## 2022-09-16 VITALS — BP 128/70 | HR 74 | Temp 97.8°F | Ht 67.0 in | Wt 166.2 lb

## 2022-09-16 DIAGNOSIS — I2510 Atherosclerotic heart disease of native coronary artery without angina pectoris: Secondary | ICD-10-CM

## 2022-09-16 DIAGNOSIS — G43809 Other migraine, not intractable, without status migrainosus: Secondary | ICD-10-CM

## 2022-09-16 DIAGNOSIS — I10 Essential (primary) hypertension: Secondary | ICD-10-CM | POA: Diagnosis not present

## 2022-09-16 DIAGNOSIS — E0590 Thyrotoxicosis, unspecified without thyrotoxic crisis or storm: Secondary | ICD-10-CM

## 2022-09-16 MED ORDER — SUMATRIPTAN SUCCINATE 100 MG PO TABS: 100.0000 mg | ORAL_TABLET | Freq: Every day | ORAL | 0 refills | Status: DC | PRN

## 2022-09-16 NOTE — Assessment & Plan Note (Signed)
#   Migraines S: sumatriptan 100mg  helpful whereas 50 mg was not helpful- since cardiac disease thought largely related to thyrotoxicosis we have thought reasonable to continue  -averaging about one a week -Worse in the past with high blood pressure A/P: Needing medication about once a week.  With CAD sumatriptan may not be ideal though once again that was primarily in context of thyrotoxicosis - We ultimately opted to refill sumatriptan but also refer to neurology for their opinion

## 2022-09-16 NOTE — Progress Notes (Signed)
Phone [phone] In person visit   Subjective:   Carla West is a 62 y.o. year old very pleasant female patient who presents for/with See problem oriented charting Chief Complaint  Patient presents with   Follow-up   Past Medical History-  Patient Active Problem List   Diagnosis Date Noted   CAD (coronary artery disease) 11/07/2020    Priority: High   Hyperthyroidism 02/26/2019    Priority: High   Essential hypertension 08/03/2015    Priority: High   Hyperglycemia 08/05/2022    Priority: Medium    Palpitations 04/11/2018    Priority: Medium    Hyperlipidemia 12/28/2017    Priority: Medium    Migraine headache 08/03/2015    Priority: Medium    Skin lesion 11/05/2020    Priority: Low   Abnormal Pap smear of cervix 08/03/2015    Priority: Low   Osteopenia of lumbar spine 12/07/2017    Medications- reviewed and updated Current Outpatient Medications  Medication Sig Dispense Refill   SUMAtriptan (IMITREX) 100 MG tablet Take 1 tablet (100 mg total) by mouth daily as needed for migraine. 10 tablet 0   aspirin EC 81 MG tablet Take 1 tablet (81 mg total) by mouth daily. 90 tablet 3   atorvastatin (LIPITOR) 80 MG tablet Take 1 tablet (80 mg total) by mouth daily at 6 PM. 90 tablet 3   ezetimibe (ZETIA) 10 MG tablet TAKE 1 TABLET(10 MG) BY MOUTH DAILY 90 tablet 3   losartan (COZAAR) 50 MG tablet TAKE 1 TABLET(50 MG) BY MOUTH DAILY 90 tablet 3   methimazole (TAPAZOLE) 10 MG tablet TAKE 1 TABLET(10 MG) BY MOUTH TWICE DAILY IN THE MORNING AND IN THE EVENING WITH MEALS 180 tablet 0   metoprolol succinate (TOPROL-XL) 50 MG 24 hr tablet TAKE 1 TABLET(50 MG) BY MOUTH DAILY WITH OR IMMEDIATELY FOLLOWING A MEAL 90 tablet 3   triamterene-hydrochlorothiazide (MAXZIDE-25) 37.5-25 MG tablet TAKE 1 TABLET BY MOUTH DAILY 90 tablet 0   Vitamin D, Ergocalciferol, (DRISDOL) 1.25 MG (50000 UNIT) CAPS capsule Take 1 capsule (50,000 Units total) by mouth every 7 (seven) days. 13 capsule 1   No  current facility-administered medications for this visit.     Objective:  BP 128/70   Pulse 74   Temp 97.8 F (36.6 C)   Ht 5\' 7"  (1.702 m)   Wt 166 lb 3.2 oz (75.4 kg)   SpO2 97%   BMI 26.03 kg/m  Gen: NAD, resting comfortably CV: RRR no murmurs rubs or gallops Lungs: CTAB no crackles, wheeze, rhonchi Ext: no edema     Assessment and Plan    # Depression S: Medication: Lexapro 10 mg prescribed but when she found out D was so low wanted to try treatment for that before starting and never started- removing from list - being treated as significantly low vitamin D  -wants to add exercise     09/16/2022    3:04 PM 08/05/2022    2:47 PM 01/01/2022    3:52 PM  Depression screen PHQ 2/9  Decreased Interest 0 2 0  Down, Depressed, Hopeless 1 2 0  PHQ - 2 Score 1 4 0  Altered sleeping 3 2   Tired, decreased energy 1 2   Change in appetite 1 2   Feeling bad or failure about yourself  0 0   Trouble concentrating 0 2   Moving slowly or fidgety/restless 0 0   Suicidal thoughts 0 0   PHQ-9 Score 6 12   Difficult  doing work/chores Not difficult at all Not difficult at all       09/16/2022    3:04 PM 08/05/2022    2:48 PM 11/07/2020    3:38 PM  GAD 7 : Generalized Anxiety Score  Nervous, Anxious, on Edge 1 1 1   Control/stop worrying 1 1 2   Worry too much - different things 1 1 2   Trouble relaxing 2 1 3   Restless 0 0 0  Easily annoyed or irritable 0 0 0  Afraid - awful might happen 0 1 0  Total GAD 7 Score 5 5 8   Anxiety Difficulty Not difficult at all Not difficult at all Not difficult at all  A/P: Depression has improved without addition of medication with starting vitamin D-she would like to remain on medicine try to add exercise instead of adding additional medicine at this time-we opted for 39-month follow-up physical  #Vitamin D deficiency- as low as 25 July 2022 S: Medication: 50000 units  Last vitamin D Lab Results  Component Value Date   VD25OH 11.21 (L)  08/05/2022  A/P: Vitamin D likely improving-continue supplementation for total of 6 months and then likely transition to 2000 units/day  # left shoulder rotator cuff impingement S:saw Dr. Georgina Snell was referred to PT and did not have best experience (they were often late). She never took gabapentin or tizanidine. Prednisone was helpful.  Did have degenerative changes mild in [facility] joint.   Later saw chiropractor and found beneficial- still doing exercises from them and physical therapy  A/P: in a better position- wants to monitor for now and keep exercises   #CAD with history of stent 02/27/2019  At time of active graves disease #hyperlipidemia-LDL goal under 70 S: Medication: brilinta 90 mg BID, aspirin 81mg , atorvastatin 80mg  (on hold- she had talked to pharmacist through work and thought only supposed to be on one) , zetia only at moment -No chest pain or shortness of breath reported Lab Results  Component Value Date   CHOL 301 (H) 01/01/2022   HDL 81 01/01/2022   LDLCALC 194 (H) 01/01/2022   LDLDIRECT 165 (H) 07/03/2021   TRIG 119 01/01/2022   CHOLHDL 3.7 01/01/2022   A/P: CAD asymptomatic-continue current medications other than restarting statin Lipids have been poorly controlled-patient is taking Zetia but encouraged her to restart her atorvastatin 80 mg-I am not sure pharmacist was aware that she has coronary artery disease  #Resistant hypertension- hyperthyroidism possibly contributing in the past S: medication: losartan 50mg  AM, metoprolol 50mg  XR PM, triamterene-hctz 37.5-25mg  PM - when we planned to reduce amlodipine in 2023- she actually stopped- blood pressure still controlled -normal renal arteries 03/12/20 A/P: Controlled. Continue current medications.  #hyperthyroidism/graves disease-follows with Dr. Kelton Pillar S: compliant On thyroid medication-methimazole 10mg  BID - down to once a day Lab Results  Component Value Date   TSH 1.14 08/05/2022  A/P: Well-controlled despite her  reduction-continue current medication and recheck in 4 months  # Migraines S: sumatriptan 100mg  helpful whereas 50 mg was not helpful- since cardiac disease thought largely related to thyrotoxicosis we have thought reasonable to continue  -averaging about one a week -Worse in the past with high blood pressure A/P: Needing medication about once a week.  With CAD sumatriptan may not be ideal though once again that was primarily in context of thyrotoxicosis - We ultimately opted to refill sumatriptan but also refer to neurology for their opinion  #Insomnia- groggy next day on hydroxyzine- uses very sparingly-she reports taking medication that starts with a  T-possibly trazodone-she is going to check on this at home and let us know  # Creatinine elevation-recheck CMP-elevated at last visit-plan was for 1 month follow-up but we will check this today since she has not been able to come in-if still elevated or worsening may need further workup  Recommended follow up: Return in about 4 months (around 01/16/2023) for physical or sooner if needed.Schedule b4 you leave. Future Appointments  Date Time Provider [place]  02/24/2023  8:20 AM Marin Olp, MD [facility]    Lab/Order associations:   ICD-10-CM   1. Other migraine without status migrainosus, not intractable  G43.809 Ambulatory referral to Neurology    2. Essential hypertension  I10 Comprehensive metabolic panel    3. Coronary artery disease involving native coronary artery of native heart without angina pectoris  I25.10     4. Hyperthyroidism  E05.90       Meds ordered this encounter  Medications   SUMAtriptan (IMITREX) 100 MG tablet    Sig: Take 1 tablet (100 mg total) by mouth daily as needed for migraine.    Dispense:  10 tablet    Refill:  0   Return precautions advised.  Garret Reddish, MD

## 2022-09-16 NOTE — Patient Instructions (Addendum)
Please stop by lab before you go If you have mychart- we will send your results within 3 business days of [place] receiving them.  If you do not have mychart- we will call you about results within 5 business days of [place] receiving them.  *please also note that you will see labs on mychart as soon as they post. I will later go in and write notes on them- will say "notes from Dr. Yong Channel"   Sumatriptan 100mg  prescribed but not ideal with CAD/heart issues- with that being said heart issues likely largely related to thyroid issues which are controlled which is why Im willing to prescribe but lets get neurology opinion- call their # if you do not hear within a week  Recommended follow up: Return in about 4 months (around 01/16/2023) for physical or sooner if needed.Schedule b4 you leave.

## 2022-09-17 ENCOUNTER — Encounter: Payer: Self-pay | Admitting: Family Medicine

## 2022-09-17 LAB — COMPREHENSIVE METABOLIC PANEL
ALT: 11 U/L (ref 0–35)
AST: 15 U/L (ref 0–37)
Albumin: 4.2 g/dL (ref 3.5–5.2)
Alkaline Phosphatase: 70 U/L (ref 39–117)
BUN: 24 mg/dL — ABNORMAL HIGH (ref 6–23)
CO2: 26 mEq/L (ref 19–32)
Calcium: 8.9 mg/dL (ref 8.4–10.5)
Chloride: 107 mEq/L (ref 96–112)
Creatinine, Ser: 1.17 mg/dL (ref 0.40–1.20)
GFR: 51.03 mL/min — ABNORMAL LOW (ref >60.00)
Glucose, Bld: 109 mg/dL — ABNORMAL HIGH (ref 70–99)
Potassium: 4.1 mEq/L (ref 3.5–5.1)
Sodium: 141 mEq/L (ref 135–145)
Total Bilirubin: 0.3 mg/dL (ref 0.2–1.2)
Total Protein: 6.7 g/dL (ref 6.0–8.3)

## 2022-09-20 ENCOUNTER — Encounter: Payer: Self-pay | Admitting: Neurology

## 2022-09-27 ENCOUNTER — Encounter: Payer: Self-pay | Admitting: *Deleted

## 2023-01-06 ENCOUNTER — Other Ambulatory Visit: Payer: Self-pay | Admitting: Family Medicine

## 2023-01-10 ENCOUNTER — Other Ambulatory Visit: Payer: Self-pay | Admitting: Family Medicine

## 2023-01-11 ENCOUNTER — Other Ambulatory Visit: Payer: Self-pay | Admitting: Family Medicine

## 2023-01-17 ENCOUNTER — Encounter: Payer: Self-pay | Admitting: Family Medicine

## 2023-01-17 ENCOUNTER — Ambulatory Visit: Payer: 59 | Admitting: Family Medicine

## 2023-01-17 VITALS — BP 118/80 | HR 64 | Temp 97.7°F | Ht 67.0 in | Wt 167.0 lb

## 2023-01-17 DIAGNOSIS — E0590 Thyrotoxicosis, unspecified without thyrotoxic crisis or storm: Secondary | ICD-10-CM | POA: Diagnosis not present

## 2023-01-17 DIAGNOSIS — I10 Essential (primary) hypertension: Secondary | ICD-10-CM | POA: Diagnosis not present

## 2023-01-17 DIAGNOSIS — E559 Vitamin D deficiency, unspecified: Secondary | ICD-10-CM

## 2023-01-17 DIAGNOSIS — R739 Hyperglycemia, unspecified: Secondary | ICD-10-CM

## 2023-01-17 DIAGNOSIS — I2510 Atherosclerotic heart disease of native coronary artery without angina pectoris: Secondary | ICD-10-CM

## 2023-01-17 DIAGNOSIS — E785 Hyperlipidemia, unspecified: Secondary | ICD-10-CM

## 2023-01-17 MED ORDER — HYDROXYZINE HCL 25 MG PO TABS: ORAL_TABLET | ORAL | 3 refills | Status: DC

## 2023-01-17 MED ORDER — ATORVASTATIN CALCIUM 80 MG PO TABS: 80.0000 mg | ORAL_TABLET | Freq: Every day | ORAL | 3 refills | Status: DC

## 2023-01-17 MED ORDER — TRIAMTERENE-HCTZ 37.5-25 MG PO TABS: 1.0000 | ORAL_TABLET | Freq: Every day | ORAL | 3 refills | Status: DC

## 2023-01-17 MED ORDER — VITAMIN D (ERGOCALCIFEROL) 1.25 MG (50000 UNIT) PO CAPS: 50000.0000 [IU] | ORAL_CAPSULE | ORAL | 1 refills | Status: DC

## 2023-01-17 MED ORDER — SUMATRIPTAN SUCCINATE 100 MG PO TABS: 100.0000 mg | ORAL_TABLET | Freq: Every day | ORAL | 11 refills | Status: DC | PRN

## 2023-01-17 MED ORDER — EZETIMIBE 10 MG PO TABS: 10.0000 mg | ORAL_TABLET | Freq: Every day | ORAL | 3 refills | Status: DC

## 2023-01-17 MED ORDER — LOSARTAN POTASSIUM 50 MG PO TABS: ORAL_TABLET | ORAL | 3 refills | Status: DC

## 2023-01-17 MED ORDER — METOPROLOL SUCCINATE ER 50 MG PO TB24: 50.0000 mg | ORAL_TABLET | Freq: Every day | ORAL | 3 refills | Status: DC

## 2023-01-17 MED ORDER — METHIMAZOLE 10 MG PO TABS: 10.0000 mg | ORAL_TABLET | Freq: Every day | ORAL | 3 refills | Status: DC

## 2023-01-17 NOTE — Patient Instructions (Addendum)
Schedule labs 02/03/23 or later-Schedule a lab visit at the check out desk within 2 weeks. Return for future fasting labs meaning nothing but water after midnight please. Ok to take your medications with water.   Please stop by lab before you go If you have mychart- we will send your results within 3 business days of [place] receiving them.  If you do not have mychart- we will call you about results within 5 business days of [place] receiving them.  *please also note that you will see labs on mychart as soon as they post. I will later go in and write notes on them- will say "notes from Dr. Durene Cal"   Recommended follow up: Return in about 6 months (around 07/20/2023) for physical or sooner if needed.Schedule b4 you leave. -cancel September 12th physical on your way out

## 2023-01-17 NOTE — Progress Notes (Signed)
Phone [phone] In person visit   Subjective:   Carla West is a 62 y.o. year old very pleasant female patient who presents for/with See problem oriented charting Chief Complaint  Patient presents with   medication refills    Past Medical History-  Patient Active Problem List   Diagnosis Date Noted   CAD (coronary artery disease) 11/07/2020    Priority: High   Hyperthyroidism 02/26/2019    Priority: High   Essential hypertension 08/03/2015    Priority: High   Hyperglycemia 08/05/2022    Priority: Medium    Palpitations 04/11/2018    Priority: Medium    Hyperlipidemia 12/28/2017    Priority: Medium    Migraine headache 08/03/2015    Priority: Medium    Skin lesion 11/05/2020    Priority: Low   Abnormal Pap smear of cervix 08/03/2015    Priority: Low   Osteopenia of lumbar spine 12/07/2017    Medications- reviewed and updated Current Outpatient Medications  Medication Sig Dispense Refill   aspirin EC 81 MG tablet Take 1 tablet (81 mg total) by mouth daily. 90 tablet 3   atorvastatin (LIPITOR) 80 MG tablet Take 1 tablet (80 mg total) by mouth daily at 6 PM. 90 tablet 3   ezetimibe (ZETIA) 10 MG tablet Take 1 tablet (10 mg total) by mouth daily. 90 tablet 3   hydrOXYzine (ATARAX) 25 MG tablet 1 tab po q hs prn insomnia 90 tablet 3   losartan (COZAAR) 50 MG tablet TAKE 1 TABLET(50 MG) BY MOUTH DAILY 90 tablet 3   methimazole (TAPAZOLE) 10 MG tablet Take 1 tablet (10 mg total) by mouth daily. 180 tablet 3   metoprolol succinate (TOPROL-XL) 50 MG 24 hr tablet Take 1 tablet (50 mg total) by mouth daily. Take with or immediately following a meal. 90 tablet 3   SUMAtriptan (IMITREX) 100 MG tablet Take 1 tablet (100 mg total) by mouth daily as needed for migraine. 10 tablet 11   triamterene-hydrochlorothiazide (MAXZIDE-25) 37.5-25 MG tablet Take 1 tablet by mouth daily. 90 tablet 3   Vitamin D, Ergocalciferol, (DRISDOL) 1.25 MG (50000 UNIT) CAPS capsule Take 1 capsule  (50,000 Units total) by mouth every 7 (seven) days. 13 capsule 1   No current facility-administered medications for this visit.     Objective:  BP 118/80   Pulse 64   Temp 97.7 F (36.5 C)   Ht 5\' 7"  (1.702 m)   Wt 167 lb (75.8 kg)   SpO2 98%   BMI 26.16 kg/m  Gen: NAD, resting comfortably CV: RRR no murmurs rubs or gallops Lungs: CTAB no crackles, wheeze, rhonchi Ext: no edema Skin: warm, dry     Assessment and Plan    #social update- looking at working with home care with orthopedic to set patients up likely in September  #CAD with history of stent 02/27/2019  At time of active graves disease #hyperlipidemia-LDL goal under 2 S: Medication: aspirin 81mg , atorvastatin 80mg , zetia added 11/07/2020 -no chest pain or shortness of breath  Lab Results  Component Value Date   CHOL 301 (H) 01/01/2022   HDL 81 01/01/2022   LDLCALC 194 (H) 01/01/2022   LDLDIRECT 165 (H) 07/03/2021   TRIG 119 01/01/2022   CHOLHDL 3.7 01/01/2022   A/P: coronary artery disease asymptomatic - continue current medications  Hopefully lipids are much improved- will come back for fasting labs  #Resistant hypertension- hyperthyroidism possibly contributing S: medication: losartan 50mg  AM, metoprolol 50mg  XR PM, triamterene-hctz 37.5-25mg  PM -normal renal  arteries 03/12/20 A/P: stable- continue current medicines    #hyperthyroidism/graves disease-follows with Dr. Lonzo Cloud S: compliant On thyroid medication-methimazole 10mg  BID  - RAI or surgery in active consideration - has wanted to hold off  A/P:hopefully stable- update TSH, free t4 today. Continue current meds for now    # Migraines S: 3 per week in past now 2 a week -triptan prn - was told ok in past as nstemi related to thyrotoxicosis A/P: has upcoming neurology visit for their opinion  on long term treatment  # Hyperglycemia/insulin resistance/prediabetes- peak a1c of 5.9 S:  Medication: none Exercise and diet- work has been very  challenging- hard to find time for exercise. Some walking. Doing meal prep sunday  A/P: hopefully stable- update a1c today. Continue without meds for now   #IBS- dicyclomine per GI in past- not needing  #Insomnia- groggy next day on hydroxyzine in past- now doing better  #Vitamin D deficiency- as low as 25 July 2022 S: Medication: on high dose still every 7 days - did feel down when this was so low- improved as it has  A/P: hopefully stable- update vitmain D today. Continue current meds for now  - depending on levels may have to have her stop this  Recommended follow up: Return in about 6 months (around 07/20/2023) for physical or sooner if needed.Schedule b4 you leave. Future Appointments  Date Time Provider Department Center  01/27/2023  7:50 AM Drema Dallas, DO [facility] None  02/24/2023  8:20 AM Durene Cal Aldine Contes, MD [facility]   Lab/Order associations:   ICD-10-CM   1. Essential hypertension  I10     2. Hyperthyroidism  E05.90 TSH    T4, free    Lipid panel    3. Coronary artery disease involving native coronary artery of native heart without angina pectoris  I25.10     4. Hyperlipidemia, unspecified hyperlipidemia type  E78.5 Comprehensive metabolic panel    CBC w/Diff    5. Hyperglycemia  R73.9 Hemoglobin A1c    6. Vitamin D deficiency  E55.9 VITAMIN D 25 Hydroxy (Vit-D Deficiency, Fractures)      Meds ordered this encounter  Medications   ezetimibe (ZETIA) 10 MG tablet    Sig: Take 1 tablet (10 mg total) by mouth daily.    Dispense:  90 tablet    Refill:  3   losartan (COZAAR) 50 MG tablet    Sig: TAKE 1 TABLET(50 MG) BY MOUTH DAILY    Dispense:  90 tablet    Refill:  3   metoprolol succinate (TOPROL-XL) 50 MG 24 hr tablet    Sig: Take 1 tablet (50 mg total) by mouth daily. Take with or immediately following a meal.    Dispense:  90 tablet    Refill:  3   SUMAtriptan (IMITREX) 100 MG tablet    Sig: Take 1 tablet (100 mg total) by mouth daily as needed for  migraine.    Dispense:  10 tablet    Refill:  11   triamterene-hydrochlorothiazide (MAXZIDE-25) 37.5-25 MG tablet    Sig: Take 1 tablet by mouth daily.    Dispense:  90 tablet    Refill:  3   Vitamin D, Ergocalciferol, (DRISDOL) 1.25 MG (50000 UNIT) CAPS capsule    Sig: Take 1 capsule (50,000 Units total) by mouth every 7 (seven) days.    Dispense:  13 capsule    Refill:  1   atorvastatin (LIPITOR) 80 MG tablet    Sig: Take 1  tablet (80 mg total) by mouth daily at 6 PM.    Dispense:  90 tablet    Refill:  3   methimazole (TAPAZOLE) 10 MG tablet    Sig: Take 1 tablet (10 mg total) by mouth daily.    Dispense:  180 tablet    Refill:  3   hydrOXYzine (ATARAX) 25 MG tablet    Sig: 1 tab po q hs prn insomnia    Dispense:  90 tablet    Refill:  3    Return precautions advised.  Tana Conch, MD

## 2023-01-26 NOTE — Progress Notes (Unsigned)
NEUROLOGY CONSULTATION NOTE  Carla West MRN: [id number] DOB: Oct 18, 1960  Referring provider: Tana Conch, MD Primary care provider: Tana Conch, MD  Reason for consult:  migraines  Assessment/Plan:   Migraine without aura, without status migrainosus, not intractable Hypertension Coronary artery disease    Migraine prevention:  Plan to start Emgality.  Would avoid Aimovig as she has history of hypertension that has been difficult to control Migraine rescue:  She will try samples of Ubrelvy with Zofran ODT 4mg .  She will stop sumatriptan due to coronary artery disease Limit use of pain relievers to no more than 2 days out of week to prevent risk of rebound or medication-overuse headache. Keep headache diary Follow up 6 months.    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  History supplemented by referring provider's note.  Onset:  in her 18s.  Off and on throughout the years.  Returned in July.   Location:  frontal/temples bilaterally Quality:  pounding  Intensity:  4-5/10 if treated early, otherwise 8-9/10.   Aura:  absent Prodrome:  nausea, lightheadedness Postdrome:  feels "drugged" and fatigued Associated symptoms:  nausea, vomiting, photophobia, phonophobia, osmophobia, left eye twitching, bilateral eye drooping, tremulous.  She denies associated unilateral numbness or weakness. Duration:  if treated, aborts fairly quickly, otherwise all day Frequency:  once a week Frequency of abortive medication: 2 to 3 days a week Triggers/aggravating factors:  sleep deprivation, poor diet, skipped meals, stress, alcohol Relieving factors:  rest, taking abortive medication early (Excedrin or sumatriptan), coffee Activity:  movement and activity aggravates  She takes sumatriptan which is helpful.  She was prescribed sumatriptan despite having  coronary artery disease because there possibly may be less risk as her cardiac disease is thought to be secondary to thyrotoxicosis.  Prior workup includes CT head from 01/14/2016, personally reviewed, which was normal.   Past NSAIDS/analgesics:  Fioricet, ibuprofen, naproxen, diclofenac Past abortive triptans:  rizatriptan, almotriptan, zolmitriptan Past abortive ergotamine:  none Past muscle relaxants:  cyclobenzaprine, tizanidine Past anti-emetic:  ondansetron, promethazine Past antihypertensive medications:  lisinopril, amlodipine, clonidine, hydralazine Past antidepressant medications:  amitriptyline Past anticonvulsant medications:  topiramate, Depakote Past anti-CGRP:  none Past vitamins/Herbal/Supplements:  none Past antihistamines/decongestants:  none Other past therapies:  none  Current NSAIDS/analgesics:  ASA 81mg , Excedrin Migraine Current triptans:  sumatriptan 100mg  Current ergotamine:  none Current anti-emetic:  none Current muscle relaxants:  none Current Antihypertensive medications:  metoprolol succinate 50mg , losartan, Maxzide Current Antidepressant medications:  none Current Anticonvulsant medications:  none Current anti-CGRP:  none Current Vitamins/Herbal/Supplements:  D Current Antihistamines/Decongestants:  none Other therapy:  none   Caffeine:  1 large cup of coffee in morning, sometimes a couple of sips to treat a headache.  Sometimes sweet tea Alcohol:  rarely  Diet:  40 oz water daily at most.  No soda.  Does not skip meals Exercise:  no exercise since July due to migraines Depression:  stable; Anxiety:  improved Other pain:  neck pain Sleep hygiene:  Trouble staying asleep.  Wakes up frequently.  Total 5 hours a night. Family history of migraines:  son      PAST MEDICAL HISTORY: Past Medical History:  Diagnosis Date   Abnormal Pap smear of cervix 08/03/2015   Anxiety    Benign essential HTN 08/03/2015   Depression    Graves disease     History of chicken pox  Migraine headache 08/03/2015    PAST SURGICAL HISTORY: Past Surgical History:  Procedure Laterality Date   CORONARY STENT INTERVENTION N/A 02/27/2019   Procedure: CORONARY STENT INTERVENTION;  Surgeon: Kathleene Hazel, MD;  Location: [facility];  Service: Cardiovascular;  Laterality: N/A;   LEFT HEART CATH AND CORONARY ANGIOGRAPHY N/A 02/27/2019   Procedure: LEFT HEART CATH AND CORONARY ANGIOGRAPHY;  Surgeon: Kathleene Hazel, MD;  Location: [facility];  Service: Cardiovascular;  Laterality: N/A;   TUBAL LIGATION      MEDICATIONS: Current Outpatient Medications on File Prior to Visit  Medication Sig Dispense Refill   aspirin EC 81 MG tablet Take 1 tablet (81 mg total) by mouth daily. 90 tablet 3   atorvastatin (LIPITOR) 80 MG tablet Take 1 tablet (80 mg total) by mouth daily at 6 PM. 90 tablet 3   ezetimibe (ZETIA) 10 MG tablet Take 1 tablet (10 mg total) by mouth daily. 90 tablet 3   hydrOXYzine (ATARAX) 25 MG tablet 1 tab po q hs prn insomnia 90 tablet 3   losartan (COZAAR) 50 MG tablet TAKE 1 TABLET(50 MG) BY MOUTH DAILY 90 tablet 3   methimazole (TAPAZOLE) 10 MG tablet Take 1 tablet (10 mg total) by mouth daily. 180 tablet 3   metoprolol succinate (TOPROL-XL) 50 MG 24 hr tablet Take 1 tablet (50 mg total) by mouth daily. Take with or immediately following a meal. 90 tablet 3   SUMAtriptan (IMITREX) 100 MG tablet Take 1 tablet (100 mg total) by mouth daily as needed for migraine. 10 tablet 11   triamterene-hydrochlorothiazide (MAXZIDE-25) 37.5-25 MG tablet Take 1 tablet by mouth daily. 90 tablet 3   Vitamin D, Ergocalciferol, (DRISDOL) 1.25 MG (50000 UNIT) CAPS capsule Take 1 capsule (50,000 Units total) by mouth every 7 (seven) days. 13 capsule 1   No current facility-administered medications on file prior to visit.    ALLERGIES: Allergies  Allergen Reactions   Elavil [Amitriptyline Hcl] Other (See Comments)    Jittery..the  patient states she has not had any side effects     FAMILY HISTORY: Family History  Problem Relation Age of Onset   Hypertension Mother    Heart disease Mother        in late 8s   Hyperlipidemia Father    Stroke Father        later in life   Breast cancer Maternal Grandmother    Hyperlipidemia Sister    Other Brother        plan crash in [employer]   Migraines Son    Heart disease Brother        CHF, arrythmia   Liver disease Brother        alcohol   Hypertension Brother    Gout Brother    Hypertension Brother     Objective:  Blood pressure (!) 148/91, pulse 63, height 5\' 7"  (1.702 m), weight 166 lb 12.8 oz (75.7 kg), SpO2 99%. General: No acute distress.  Patient appears well-groomed.   Head:  Normocephalic/atraumatic Eyes:  fundi examined but not visualized Neck: supple, no paraspinal tenderness, full range of motion Heart: regular rate and rhythm Neurological Exam: Mental status: alert and oriented to person, place, and time, speech fluent and not dysarthric, language intact. Cranial nerves: CN I: not tested CN II: pupils equal, round and reactive to light, visual fields intact CN III, IV, VI:  full range of motion, no nystagmus, no ptosis CN V: facial sensation intact. CN VII:  upper and lower face symmetric CN VIII: hearing intact CN IX, X: gag intact, uvula midline CN XI: sternocleidomastoid and trapezius muscles intact CN XII: tongue midline Bulk & Tone: normal, no fasciculations. Motor:  muscle strength 5/5 throughout Sensation:  Pinprick, temperature and vibratory sensation intact. Deep Tendon Reflexes:  2+ throughout,  toes downgoing.   Finger to nose testing:  Without dysmetria.   Heel to shin:  Without dysmetria.   Gait:  Normal station and stride.  Romberg negative.    Thank you for allowing me to take part in the care of this patient.  Shon Millet, DO  CC: Tana Conch, MD

## 2023-01-27 ENCOUNTER — Encounter: Payer: Self-pay | Admitting: Neurology

## 2023-01-27 ENCOUNTER — Ambulatory Visit: Payer: 59 | Admitting: Neurology

## 2023-01-27 VITALS — BP 154/91 | HR 63 | Ht 67.0 in | Wt 166.8 lb

## 2023-01-27 DIAGNOSIS — I2510 Atherosclerotic heart disease of native coronary artery without angina pectoris: Secondary | ICD-10-CM | POA: Diagnosis not present

## 2023-01-27 DIAGNOSIS — I10 Essential (primary) hypertension: Secondary | ICD-10-CM

## 2023-01-27 DIAGNOSIS — G43009 Migraine without aura, not intractable, without status migrainosus: Secondary | ICD-10-CM

## 2023-01-27 MED ORDER — ONDANSETRON 4 MG PO TBDP: 4.0000 mg | ORAL_TABLET | Freq: Three times a day (TID) | ORAL | 5 refills | Status: AC | PRN

## 2023-01-27 MED ORDER — EMGALITY 120 MG/ML ~~LOC~~ SOAJ: 240.0000 mg | Freq: Once | SUBCUTANEOUS | 0 refills | Status: DC

## 2023-01-27 NOTE — Patient Instructions (Signed)
  Start Emgality - 2 injections for first dose, then 1 injection every 28 days thereafter.  Once you pick up the first dose, contact me and I will send in standing order. Stop SUMATRIPTAN.  Take Ubrelvy at earliest onset of headache.  May repeat dose once in 2 hours if needed.  Maximum 2 tablets in 24 hours.  Let me know if it works Zofran for nausea Limit use of pain relievers to no more than 2 days out of the week.  These medications include acetaminophen, NSAIDs (ibuprofen/Advil/Motrin, naproxen/Aleve, triptans (Imitrex/sumatriptan), Excedrin, and narcotics.  This will help reduce risk of rebound headaches. Be aware of common food triggers Routine exercise Stay adequately hydrated (aim for 64 oz water daily) Keep headache diary Maintain proper stress management Maintain proper sleep hygiene Do not skip meals Consider supplements:  magnesium citrate 400mg  daily, riboflavin 400mg  daily, coenzyme Q10  300mg  daily.

## 2023-01-28 ENCOUNTER — Other Ambulatory Visit (HOSPITAL_COMMUNITY): Payer: Self-pay

## 2023-02-01 ENCOUNTER — Encounter: Payer: Self-pay | Admitting: Neurology

## 2023-02-01 ENCOUNTER — Telehealth: Payer: Self-pay

## 2023-02-01 NOTE — Telephone Encounter (Signed)
Per Patient mychart message, Carla West is stated they are trying to reach your office to advised they have to have authorization for the meds. Can you please let me know if there is anything I need to do on my end.

## 2023-02-03 ENCOUNTER — Other Ambulatory Visit (INDEPENDENT_AMBULATORY_CARE_PROVIDER_SITE_OTHER): Payer: 59

## 2023-02-03 DIAGNOSIS — E785 Hyperlipidemia, unspecified: Secondary | ICD-10-CM

## 2023-02-03 DIAGNOSIS — R739 Hyperglycemia, unspecified: Secondary | ICD-10-CM | POA: Diagnosis not present

## 2023-02-03 DIAGNOSIS — E0590 Thyrotoxicosis, unspecified without thyrotoxic crisis or storm: Secondary | ICD-10-CM

## 2023-02-03 DIAGNOSIS — E559 Vitamin D deficiency, unspecified: Secondary | ICD-10-CM

## 2023-02-03 LAB — COMPREHENSIVE METABOLIC PANEL
ALT: 11 U/L (ref 0–35)
AST: 16 U/L (ref 0–37)
Albumin: 4 g/dL (ref 3.5–5.2)
Alkaline Phosphatase: 60 U/L (ref 39–117)
BUN: 21 mg/dL (ref 6–23)
CO2: 27 meq/L (ref 19–32)
Calcium: 9 mg/dL (ref 8.4–10.5)
Chloride: 107 meq/L (ref 96–112)
Creatinine, Ser: 0.94 mg/dL (ref 0.40–1.20)
GFR: 66.18 mL/min (ref >60.00)
Glucose, Bld: 80 mg/dL (ref 70–99)
Potassium: 3.6 meq/L (ref 3.5–5.1)
Sodium: 141 meq/L (ref 135–145)
Total Bilirubin: 0.4 mg/dL (ref 0.2–1.2)
Total Protein: 7 g/dL (ref 6.0–8.3)

## 2023-02-03 LAB — CBC WITH DIFFERENTIAL/PLATELET
Basophils Absolute: 0 10*3/uL (ref 0.0–0.1)
Basophils Relative: 0.8 % (ref 0.0–3.0)
Eosinophils Absolute: 0 10*3/uL (ref 0.0–0.7)
Eosinophils Relative: 1.2 % (ref 0.0–5.0)
HCT: 36 % (ref 36.0–46.0)
Hemoglobin: 11.8 g/dL — ABNORMAL LOW (ref 12.0–15.0)
Lymphocytes Relative: 34.4 % (ref 12.0–46.0)
Lymphs Abs: 1.1 10*3/uL (ref 0.7–4.0)
MCHC: 32.9 g/dL (ref 30.0–36.0)
MCV: 87 fl (ref 78.0–100.0)
Monocytes Absolute: 0.3 10*3/uL (ref 0.1–1.0)
Monocytes Relative: 10.4 % (ref 3.0–12.0)
Neutro Abs: 1.7 10*3/uL (ref 1.4–7.7)
Neutrophils Relative %: 53.2 % (ref 43.0–77.0)
Platelets: 228 10*3/uL (ref 150.0–400.0)
RBC: 4.13 Mil/uL (ref 3.87–5.11)
RDW: 14.6 % (ref 11.5–15.5)
WBC: 3.2 10*3/uL — ABNORMAL LOW (ref 4.0–10.5)

## 2023-02-03 LAB — LIPID PANEL
Cholesterol: 234 mg/dL — ABNORMAL HIGH (ref 0–200)
HDL: 83.5 mg/dL (ref >39.00)
LDL Cholesterol: 139 mg/dL — ABNORMAL HIGH (ref 0–99)
NonHDL: 150.63
Total CHOL/HDL Ratio: 3
Triglycerides: 60 mg/dL (ref 0.0–149.0)
VLDL: 12 mg/dL (ref 0.0–40.0)

## 2023-02-03 LAB — HEMOGLOBIN A1C: Hgb A1c MFr Bld: 6 % (ref 4.6–6.5)

## 2023-02-04 LAB — T4, FREE: Free T4: 0.97 ng/dL (ref 0.60–1.60)

## 2023-02-04 LAB — VITAMIN D 25 HYDROXY (VIT D DEFICIENCY, FRACTURES): VITD: 33.71 ng/mL (ref 30.00–100.00)

## 2023-02-04 LAB — TSH: TSH: 0.35 u[IU]/mL (ref 0.35–5.50)

## 2023-02-08 ENCOUNTER — Other Ambulatory Visit (HOSPITAL_COMMUNITY): Payer: Self-pay

## 2023-02-08 ENCOUNTER — Telehealth: Payer: Self-pay | Admitting: Pharmacy Technician

## 2023-02-08 NOTE — Telephone Encounter (Signed)
Pharmacy Patient Advocate Encounter   Received notification from Pt Calls Messages that prior authorization for Emgality is required/requested.   Insurance verification completed.   The patient is insured through [employer] processed through [employer] PA   Per test claim: PA required; PA submitted to [employer] via Prom[employer] PA Key/confirmation #/EOC [id number] Status is pending

## 2023-02-11 ENCOUNTER — Encounter: Payer: Self-pay | Admitting: Family Medicine

## 2023-02-11 ENCOUNTER — Other Ambulatory Visit (HOSPITAL_COMMUNITY): Payer: Self-pay

## 2023-02-11 NOTE — Telephone Encounter (Signed)
Patient advised.

## 2023-02-11 NOTE — Telephone Encounter (Signed)
Pharmacy Patient Advocate Encounter  Received notification from [employer] that Prior Authorization for Emgality 120MG /ML auto-injectors (migraine) has been APPROVED from 02-09-2023 to 02-08-2024. Ran test claim, Copay is $35.00. This test claim was processed through [facility]- copay amounts may vary at other pharmacies due to pharmacy/plan contracts, or as the patient moves through the different stages of their insurance plan.   PA #/Case ID/Reference #: [id number]

## 2023-02-24 ENCOUNTER — Encounter: Payer: 59 | Admitting: Family Medicine

## 2023-02-25 ENCOUNTER — Other Ambulatory Visit (HOSPITAL_COMMUNITY): Payer: Self-pay

## 2023-03-12 ENCOUNTER — Other Ambulatory Visit: Payer: Self-pay | Admitting: Neurology

## 2023-08-01 NOTE — Progress Notes (Deleted)
 NEUROLOGY FOLLOW UP OFFICE NOTE  [name] [id number]  Assessment/Plan:   Migraine without aura, without status migrainosus, not intractable Hypertension Coronary artery disease    Migraine prevention:  Emgality *** Migraine rescue:  ***, Zofran 4mg  Limit use of pain relievers to no more than 2 days out of week to prevent risk of rebound or medication-overuse headache. Keep headache diary Follow up 6 months.    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  UPDATE: Started Emgality. [name] *** Intensity:  *** Duration:  *** Frequency:  *** Frequency of abortive medication: *** Current NSAIDS/analgesics:  ASA 81mg , Excedrin Migraine Current triptans: none Current ergotamine:  none Current anti-emetic:  Zofran 4mg  Current muscle relaxants:  none Current Antihypertensive medications:  metoprolol succinate 50mg , losartan, Maxzide Current Antidepressant medications:  none Current Anticonvulsant medications:  none Current anti-CGRP:  Emgality, Ubrelvy 100mg  (samples) Current Vitamins/Herbal/Supplements:  D Current Antihistamines/Decongestants:  none Other therapy:  none   Caffeine:  1 large cup of coffee in morning, sometimes a couple of sips to treat a headache.  Sometimes sweet tea Alcohol:  rarely  Diet:  40 oz water daily at most.  No soda.  Does not skip meals Exercise:  no exercise since July due to migraines Depression:  stable; Anxiety:  improved Other pain:  neck pain Sleep hygiene:  Trouble staying asleep.  Wakes up frequently.  Total 5 hours a night.  HISTORY: Onset:  in her 84s.  Off and on throughout the years.  Returned in July 2024 Location:  frontal/temples bilaterally Quality:  pounding  Intensity:  4-5/10 if treated early, otherwise 8-9/10.   Aura:  absent Prodrome:  nausea, lightheadedness Postdrome:  feels  "drugged" and fatigued Associated symptoms:  nausea, vomiting, photophobia, phonophobia, osmophobia, left eye twitching, bilateral eye drooping, tremulous.  She denies associated unilateral numbness or weakness. Duration:  if treated, aborts fairly quickly, otherwise all day Frequency:  once a week Frequency of abortive medication: 2 to 3 days a week Triggers/aggravating factors:  sleep deprivation, poor diet, skipped meals, stress, alcohol Relieving factors:  rest, taking abortive medication early (Excedrin or sumatriptan), coffee Activity:  movement and activity aggravates  She takes sumatriptan which is helpful.  She was prescribed sumatriptan despite having coronary artery disease because there possibly may be less risk as her cardiac disease is thought to be secondary to thyrotoxicosis.  Prior workup includes CT head from 01/14/2016, personally reviewed, which was normal.   Past NSAIDS/analgesics:  Fioricet, ibuprofen, naproxen, diclofenac Past abortive triptans:  rizatriptan, almotriptan, zolmitriptan, sumatriptan - contraindicated (CAD) Past abortive ergotamine:  none Past muscle relaxants:  cyclobenzaprine, tizanidine Past anti-emetic:  ondansetron, promethazine Past antihypertensive medications:  lisinopril, amlodipine, clonidine, hydralazine Past antidepressant medications:  amitriptyline Past anticonvulsant medications:  topiramate, Depakote Past anti-CGRP:  Aimovig (contraindicated with h/o poorly controlled HTN) Past vitamins/Herbal/Supplements:  none Past antihistamines/decongestants:  none Other past therapies:  none   Family history of migraines:  son  PAST MEDICAL HISTORY: Past Medical History:  Diagnosis Date   Abnormal Pap smear of cervix 08/03/2015   Anxiety    Benign essential HTN 08/03/2015   Depression    Graves disease    History of chicken pox    Migraine headache 08/03/2015    MEDICATIONS: Current Outpatient Medications on File Prior to Visit   Medication Sig Dispense Refill   aspirin EC 81 MG tablet Take 1 tablet (81  mg total) by mouth daily. 90 tablet 3   atorvastatin (LIPITOR) 80 MG tablet Take 1 tablet (80 mg total) by mouth daily at 6 PM. 90 tablet 3   EMGALITY 120 MG/ML SOAJ INJECT 240MG  UNDER THE SKIN ONCE FOR A STARTING DOSE 2 mL 0   ezetimibe (ZETIA) 10 MG tablet Take 1 tablet (10 mg total) by mouth daily. 90 tablet 3   hydrOXYzine (ATARAX) 25 MG tablet 1 tab po q hs prn insomnia 90 tablet 3   losartan (COZAAR) 50 MG tablet TAKE 1 TABLET(50 MG) BY MOUTH DAILY 90 tablet 3   methimazole (TAPAZOLE) 10 MG tablet Take 1 tablet (10 mg total) by mouth daily. 180 tablet 3   metoprolol succinate (TOPROL-XL) 50 MG 24 hr tablet Take 1 tablet (50 mg total) by mouth daily. Take with or immediately following a meal. 90 tablet 3   ondansetron (ZOFRAN-ODT) 4 MG disintegrating tablet Take 1 tablet (4 mg total) by mouth every 8 (eight) hours as needed. 20 tablet 5   SUMAtriptan (IMITREX) 100 MG tablet Take 1 tablet (100 mg total) by mouth daily as needed for migraine. 10 tablet 11   triamterene-hydrochlorothiazide (MAXZIDE-25) 37.5-25 MG tablet Take 1 tablet by mouth daily. 90 tablet 3   Vitamin D, Ergocalciferol, (DRISDOL) 1.25 MG (50000 UNIT) CAPS capsule Take 1 capsule (50,000 Units total) by mouth every 7 (seven) days. 13 capsule 1   No current facility-administered medications on file prior to visit.    ALLERGIES: Allergies  Allergen Reactions   Elavil [Amitriptyline Hcl] Other (See Comments)    Jittery..the patient states she has not had any side effects     FAMILY HISTORY: Family History  Problem Relation Age of Onset   Hypertension Mother    Heart disease Mother        in late 58s   Hyperlipidemia Father    Stroke Father        later in life   Breast cancer Maternal Grandmother    Hyperlipidemia Sister    Other Brother        plan crash in [employer]   Migraines Son    Heart disease Brother        CHF, arrythmia    Liver disease Brother        alcohol   Hypertension Brother    Gout Brother    Hypertension Brother       Objective:  *** General: No acute distress.  Patient appears ***-groomed.   Head:  Normocephalic/atraumatic Eyes:  Fundi examined but not visualized Neck: supple, no paraspinal tenderness, full range of motion Heart:  Regular rate and rhythm Lungs:  Clear to auscultation bilaterally Back: No paraspinal tenderness Neurological Exam: alert and oriented.  Speech fluent and not dysarthric, language intact.  CN II-XII intact. Bulk and tone normal, muscle strength 5/5 throughout.  Sensation to light touch intact.  Deep tendon reflexes 2+ throughout, toes downgoing.  Finger to nose testing intact.  Gait normal, Romberg negative.   Shon Millet, DO  CC: ***

## 2023-08-02 ENCOUNTER — Ambulatory Visit: Payer: Self-pay | Admitting: Neurology

## 2023-08-03 ENCOUNTER — Encounter: Payer: Self-pay | Admitting: Family Medicine

## 2023-09-02 ENCOUNTER — Ambulatory Visit: Admitting: Family

## 2023-09-02 ENCOUNTER — Telehealth: Payer: Self-pay

## 2023-09-02 ENCOUNTER — Ambulatory Visit

## 2023-09-02 ENCOUNTER — Encounter: Payer: Self-pay | Admitting: Family

## 2023-09-02 ENCOUNTER — Ambulatory Visit: Payer: Self-pay

## 2023-09-02 VITALS — BP 140/94 | HR 71 | Temp 98.2°F | Ht 67.0 in | Wt 171.2 lb

## 2023-09-02 DIAGNOSIS — M25462 Effusion, left knee: Secondary | ICD-10-CM | POA: Diagnosis not present

## 2023-09-02 DIAGNOSIS — M25562 Pain in left knee: Secondary | ICD-10-CM

## 2023-09-02 DIAGNOSIS — M85862 Other specified disorders of bone density and structure, left lower leg: Secondary | ICD-10-CM | POA: Diagnosis not present

## 2023-09-02 NOTE — Telephone Encounter (Signed)
 Copied from CRM [id number]. Topic: Clinical - Red Word Triage >> Sep 02, 2023  9:51 AM Gurney Maxin H wrote: [facility] that prompted transfer to Nurse Triage: Left knee pain, sometimes can't move leg out the car. Walking down or up stairs having pain started yesterday.  Chief Complaint: pain Symptoms: left knee pain 9/10 intermittent Frequency: Wednesday Pertinent Negatives: Patient denies fever Disposition: [] ED /[] Urgent Care (no appt availability in office) / [x] Appointment(In office/virtual)/ []  [facility]/ [] Home Care/ [] Refused Recommended Disposition /[] [facility]/ []  Follow-up with PCP Additional Notes: pt stated pain comes and goes. At times not able to move without providing assistance to knee: such a lifting the knee.  Reason for Disposition  [1] SEVERE pain (e.g., excruciating, unable to do any normal activities) AND [2] not improved after 2 hours of pain medicine  Answer Assessment - Initial Assessment Questions 1. ONSET: "When did the pain start?"    Wednesday 2. LOCATION: "Where is the pain located?"      Left knee pain 3. PAIN: "How bad is the pain?"    (Scale 1-10; or mild, moderate, severe)   -  MILD (1-3): doesn't interfere with normal activities    -  MODERATE (4-7): interferes with normal activities (e.g., work or school) or awakens from sleep, limping    -  SEVERE (8-10): excruciating pain, unable to do any normal activities, unable to walk     8/10 intermittent pain 4. WORK OR EXERCISE: "Has there been any recent work or exercise that involved this part of the body?"      N/a 5. CAUSE: "What do you think is causing the leg pain?"     unknown 6. OTHER SYMPTOMS: "Do you have any other symptoms?" (e.g., chest pain, back pain, breathing difficulty, swelling, rash, fever, numbness, weakness)     Swelling,  7. PREGNANCY: "Is there any chance you are pregnant?" "When was your last menstrual period?"     N/a  Protocols used: Leg Pain-A-AH

## 2023-09-02 NOTE — Telephone Encounter (Signed)
 Pt saw Carla West today, please follow up pt and Carla West regarding the below.  Copied from CRM [id number]. Topic: General - Other >> Sep 02, 2023  3:50 PM Eunice Blase wrote: Reason for CRM: Pt would like a call back regarding DG Knee 1-2 Views Left (Accession [id number]) (Order [id number]) and if pain medication or any medication will be given? Please call pt at [phone].

## 2023-09-02 NOTE — Progress Notes (Signed)
 Patient ID: Carla West, female    DOB: 08/23/1960, 63 y.o.   MRN: [id number]  Chief Complaint  Patient presents with   Knee Pain    Pt c.o of left knee pain since Wed, on Thursday pt had trouble getting out the car, when pt stands up the pain is worse. Pt has tried ice and heat that did not help  Discussed the use of AI scribe software for clinical note transcription with the patient, who gave verbal consent to proceed.  History of Present Illness The patient presents with acute onset knee pain that began when she stepped out of her car. She denies any known injury or trauma to the knee. The pain is located over the patella and is associated with swelling. The patient reports that the knee pain was so severe that she was unable to lift her leg or stand up. She also reports that the knee felt hot. The patient has been trying to manage the pain with ice, heat, and Aleve, but these measures have not provided significant relief. The pain is so severe that it has affected her sleep and mobility. The patient has recently started working out again, but she denies any intense activities that could have caused the knee pain. She has a family history of knee problems requiring injections for fluid removal.  Assessment & Plan Left knee pain - Acute left knee pain with swelling and weight-bearing difficulty. Differential includes patellar fracture, tendon or ligament injury, with effusion. - Order left knee X-ray to assess for patellar fracture. - Advise ice application to reduce swelling 20 minutes 3x/day - Recommend OTC 1-2 Aleve 2x/day for pain management. - Advised on going over to [facility] office at [facility] to have fully assessed, hopefully can bring up Xray.  -F/U prn   Subjective:    Outpatient Medications Prior to Visit  Medication Sig Dispense Refill   aspirin EC 81 MG tablet Take 1 tablet (81 mg total) by mouth daily. 90 tablet 3   atorvastatin (LIPITOR) 80 MG tablet Take 1  tablet (80 mg total) by mouth daily at 6 PM. 90 tablet 3   EMGALITY 120 MG/ML SOAJ INJECT 240MG  UNDER THE SKIN ONCE FOR A STARTING DOSE 2 mL 0   ezetimibe (ZETIA) 10 MG tablet Take 1 tablet (10 mg total) by mouth daily. 90 tablet 3   hydrOXYzine (ATARAX) 25 MG tablet 1 tab po q hs prn insomnia 90 tablet 3   losartan (COZAAR) 50 MG tablet TAKE 1 TABLET(50 MG) BY MOUTH DAILY 90 tablet 3   methimazole (TAPAZOLE) 10 MG tablet Take 1 tablet (10 mg total) by mouth daily. 180 tablet 3   metoprolol succinate (TOPROL-XL) 50 MG 24 hr tablet Take 1 tablet (50 mg total) by mouth daily. Take with or immediately following a meal. 90 tablet 3   ondansetron (ZOFRAN-ODT) 4 MG disintegrating tablet Take 1 tablet (4 mg total) by mouth every 8 (eight) hours as needed. 20 tablet 5   SUMAtriptan (IMITREX) 100 MG tablet Take 1 tablet (100 mg total) by mouth daily as needed for migraine. 10 tablet 11   triamterene-hydrochlorothiazide (MAXZIDE-25) 37.5-25 MG tablet Take 1 tablet by mouth daily. 90 tablet 3   Vitamin D, Ergocalciferol, (DRISDOL) 1.25 MG (50000 UNIT) CAPS capsule Take 1 capsule (50,000 Units total) by mouth every 7 (seven) days. 13 capsule 1   No facility-administered medications prior to visit.   Past Medical History:  Diagnosis Date   Abnormal Pap smear of cervix  08/03/2015   Anxiety    Benign essential HTN 08/03/2015   Depression    Graves disease    History of chicken pox    Migraine headache 08/03/2015   Past Surgical History:  Procedure Laterality Date   CORONARY STENT INTERVENTION N/A 02/27/2019   Procedure: CORONARY STENT INTERVENTION;  Surgeon: Kathleene Hazel, MD;  Location: [facility];  Service: Cardiovascular;  Laterality: N/A;   LEFT HEART CATH AND CORONARY ANGIOGRAPHY N/A 02/27/2019   Procedure: LEFT HEART CATH AND CORONARY ANGIOGRAPHY;  Surgeon: Kathleene Hazel, MD;  Location: [facility];  Service: Cardiovascular;  Laterality: N/A;   TUBAL LIGATION      Allergies  Allergen Reactions   Elavil [Amitriptyline Hcl] Other (See Comments)    Jittery..the patient states she has not had any side effects       Objective:    Physical Exam Vitals and nursing note reviewed.  Constitutional:      Appearance: Normal appearance.  Cardiovascular:     Rate and Rhythm: Normal rate and regular rhythm.  Pulmonary:     Effort: Pulmonary effort is normal.     Breath sounds: Normal breath sounds.  Musculoskeletal:     Left knee: Swelling and bony tenderness present. No erythema or ecchymosis. Decreased range of motion. Tenderness present over the medial joint line and patellar tendon.  Skin:    General: Skin is warm and dry.  Neurological:     Mental Status: She is alert.  Psychiatric:        Mood and Affect: Mood normal.        Behavior: Behavior normal.    BP (!) 140/94   Pulse 71   Temp 98.2 F (36.8 C)   Ht 5\' 7"  (1.702 m)   Wt 171 lb 3.2 oz (77.7 kg)   SpO2 98%   BMI 26.81 kg/m  Wt Readings from Last 3 Encounters:  09/02/23 171 lb 3.2 oz (77.7 kg)  01/27/23 166 lb 12.8 oz (75.7 kg)  01/17/23 167 lb (75.8 kg)       Dulce Sellar, NP

## 2023-09-05 ENCOUNTER — Encounter: Payer: Self-pay | Admitting: Family

## 2023-09-05 DIAGNOSIS — M25562 Pain in left knee: Secondary | ICD-10-CM

## 2023-09-05 NOTE — Telephone Encounter (Signed)
 Left a message for pt to call back to our office.

## 2023-09-05 NOTE — Telephone Encounter (Signed)
 no, I had recommended 600mg  Ibuprofen OTC, tid or 2 generic Aleve bid and she was to go over to the [facility] office on [address] after leaving our office for eval - did she go?

## 2023-09-05 NOTE — Telephone Encounter (Signed)
 responded to pt in separate message.

## 2023-09-05 NOTE — Telephone Encounter (Signed)
 I spoke with patient-we prefer to avoid ibuprofen with her hypertension and CAD  She has been able to ice the knee over the weekend and providing some reasonable amount of relief and she would like to stick with that over trying tramadol -She complains of buckling sensation in the knee and intermittent severe pain with walking-wonder about meniscal injury or ligament tear  Urgent referral to sports medicine was placed-team please try to get her in on Tuesday or Wednesday if at all possible  Also team please see if x-ray can be read-I explained that original plan appears was hopeful that orthopedics could view the x-ray images and interpret but the orthopedic option was not available and I apologize about this profusely

## 2023-09-06 ENCOUNTER — Other Ambulatory Visit: Payer: Self-pay

## 2023-09-06 ENCOUNTER — Encounter: Payer: Self-pay | Admitting: Family Medicine

## 2023-09-06 ENCOUNTER — Ambulatory Visit: Admitting: Family Medicine

## 2023-09-06 VITALS — BP 122/82 | HR 73 | Ht 67.0 in | Wt 168.0 lb

## 2023-09-06 DIAGNOSIS — G8929 Other chronic pain: Secondary | ICD-10-CM | POA: Diagnosis not present

## 2023-09-06 DIAGNOSIS — M25562 Pain in left knee: Secondary | ICD-10-CM

## 2023-09-06 NOTE — Patient Instructions (Addendum)
 Thank you for coming in today.   You received an injection today. Seek immediate medical attention if the joint becomes red, extremely painful, or is oozing fluid.   I've referred you to Physical Therapy.  Let us know if you don't hear from them in one week.  Tylenol arthritis  Please use Voltaren gel (Generic Diclofenac Gel) up to 4x daily for pain as needed.  This is available over-the-counter as both the name brand Voltaren gel and the generic diclofenac gel.

## 2023-09-06 NOTE — Telephone Encounter (Signed)
 Called and spoke with radiology and was told it will be ready by end of day but not able to be read by appt time @ 1:45pm today.

## 2023-09-06 NOTE — Progress Notes (Signed)
 I, Stevenson Clinch, CMA acting as a scribe for Clementeen Graham, MD.  Carla West is a 63 y.o. female who presents to [facility] at [facility] today for L knee pain. Pt was previously seen by Dr. Denyse Amass on 08/16/22 for L shoulder pain.  Today, pt c/o L knee pain ongoing since the 19th. MOI unknown. Pt locates pain to deep to the patella. Has started working out d/t weight gain but cannot recall injury to the knee. Pain worse with ascending stairs and lateral movements. Flying to [place] this coming Friday. The knee feels like it is going to buckle at times. Unable to take NSAID's d/t HTN.   L Knee swelling: yes Mechanical symptoms: yes Aggravates: standing  Treatments tried: ice, heat   Dx imaging: 09/02/23 L knee XR  Pertinent review of systems: No fevers or chills  Relevant historical information: Hypertension and coronary disease.   Exam:  BP 122/82   Pulse 73   Ht 5\' 7"  (1.702 m)   Wt 168 lb (76.2 kg)   SpO2 97%   BMI 26.31 kg/m  General: Well Developed, well nourished, and in no acute distress.   MSK: Left knee: Normal-appearing Normal motion. Tender palpation anterior knee. Intact strength pain with resisted knee extension.  No pain with resisted knee flexion. Stable ligamentous exam.    Lab and Radiology Results  Procedure: Real-time Ultrasound Guided Injection of left knee joint superior lateral patella space Device: Philips Affiniti 50G/GE Logiq Images permanently stored and available for review in PACS Ultrasound evaluation prior to injection does reveal an osteophyte at the superior patella pole at quad tendon insertion.  The patella tendon is relatively normal-appearing No severe prepatellar bursitis. Verbal informed consent obtained.  Discussed risks and benefits of procedure. Warned about infection, bleeding, hyperglycemia damage to structures among others. Patient expresses understanding and agreement Time-out conducted.   Noted no  overlying erythema, induration, or other signs of local infection.   Skin prepped in a sterile fashion.   Local anesthesia: Topical Ethyl chloride.   With sterile technique and under real time ultrasound guidance: 40 mg of Kenalog and 2 mL of Marcaine injected into knee joint. Fluid seen entering the joint capsule.   Completed without difficulty   Pain immediately resolved suggesting accurate placement of the medication.   Advised to call if fevers/chills, erythema, induration, drainage, or persistent bleeding.   Images permanently stored and available for review in the ultrasound unit.  Impression: Technically successful ultrasound guided injection.   X-ray images left knee obtained at PCP on September 02, 2023 personally and independently interpreted today. No acute fractures.  Mild DJD.  Superior patellar pole osteophyte. Await formal radiology review.     Assessment and Plan: 63 y.o. female with left knee pain.  Pain is primarily anterior knee due to patellar tendinitis or prepatellar bursitis or patellofemoral pain syndrome.  She is having quite a bit of pain right now.  Plan for intra-articular steroid injection and physical therapy.  Recommend Voltaren gel and Tylenol.  Recommend against oral ibuprofen or NSAIDs given CAD history of elevated blood pressure with these medicines.   PDMP not reviewed this encounter. Orders Placed This Encounter  Procedures   [place] LIMITED JOINT SPACE STRUCTURES LOW LEFT(NO LINKED CHARGES)    Reason for Exam (SYMPTOM  OR DIAGNOSIS REQUIRED):   left knee pain    Preferred imaging location?:   [facility] referral to Physical Therapy    Referral Priority:  Routine    Referral Type:   Physical Medicine    Referral Reason:   Specialty Services Required    Requested Specialty:   Physical Therapy    Number of Visits Requested:   1   No orders of the defined types were placed in this encounter.    Discussed warning  signs or symptoms. Please see discharge instructions. Patient expresses understanding.   The above documentation has been reviewed and is accurate and complete Clementeen Graham, M.D.

## 2023-09-12 ENCOUNTER — Encounter: Payer: Self-pay | Admitting: Family Medicine

## 2023-10-25 ENCOUNTER — Other Ambulatory Visit: Payer: Self-pay | Admitting: Family Medicine

## 2023-10-25 DIAGNOSIS — Z1231 Encounter for screening mammogram for malignant neoplasm of breast: Secondary | ICD-10-CM

## 2023-10-28 ENCOUNTER — Encounter

## 2023-10-28 DIAGNOSIS — Z1231 Encounter for screening mammogram for malignant neoplasm of breast: Secondary | ICD-10-CM

## 2023-10-31 ENCOUNTER — Ambulatory Visit
Admission: RE | Admit: 2023-10-31 | Discharge: 2023-10-31 | Disposition: A | Discharge status: Home / Self Care | Source: Ambulatory Visit | Attending: Family Medicine | Admitting: Family Medicine

## 2023-10-31 DIAGNOSIS — Z1231 Encounter for screening mammogram for malignant neoplasm of breast: Secondary | ICD-10-CM

## 2023-11-10 ENCOUNTER — Encounter: Payer: Self-pay | Admitting: Family Medicine

## 2023-11-10 ENCOUNTER — Ambulatory Visit: Admitting: Family Medicine

## 2023-11-10 VITALS — BP 148/90 | HR 63 | Temp 97.3°F | Ht 67.0 in | Wt 168.2 lb

## 2023-11-10 DIAGNOSIS — E785 Hyperlipidemia, unspecified: Secondary | ICD-10-CM | POA: Diagnosis not present

## 2023-11-10 DIAGNOSIS — R739 Hyperglycemia, unspecified: Secondary | ICD-10-CM | POA: Diagnosis not present

## 2023-11-10 DIAGNOSIS — Z131 Encounter for screening for diabetes mellitus: Secondary | ICD-10-CM

## 2023-11-10 DIAGNOSIS — I10 Essential (primary) hypertension: Secondary | ICD-10-CM

## 2023-11-10 DIAGNOSIS — E0590 Thyrotoxicosis, unspecified without thyrotoxic crisis or storm: Secondary | ICD-10-CM

## 2023-11-10 MED ORDER — ATORVASTATIN CALCIUM 80 MG PO TABS: 80.0000 mg | ORAL_TABLET | Freq: Every day | ORAL | 3 refills | Status: DC

## 2023-11-10 MED ORDER — METHIMAZOLE 10 MG PO TABS: 10.0000 mg | ORAL_TABLET | Freq: Every day | ORAL | 3 refills | Status: AC

## 2023-11-10 MED ORDER — EZETIMIBE 10 MG PO TABS: 10.0000 mg | ORAL_TABLET | Freq: Every day | ORAL | 3 refills | Status: AC

## 2023-11-10 MED ORDER — AMLODIPINE BESYLATE 5 MG PO TABS: 5.0000 mg | ORAL_TABLET | Freq: Every day | ORAL | 3 refills | Status: DC

## 2023-11-10 MED ORDER — HYDROXYZINE HCL 25 MG PO TABS: ORAL_TABLET | ORAL | 3 refills | Status: DC

## 2023-11-10 MED ORDER — METOPROLOL SUCCINATE ER 50 MG PO TB24: 50.0000 mg | ORAL_TABLET | Freq: Every day | ORAL | 3 refills | Status: DC

## 2023-11-10 MED ORDER — TRIAMTERENE-HCTZ 37.5-25 MG PO TABS: 1.0000 | ORAL_TABLET | Freq: Every day | ORAL | 3 refills | Status: AC

## 2023-11-10 MED ORDER — LOSARTAN POTASSIUM 50 MG PO TABS: ORAL_TABLET | ORAL | 3 refills | Status: DC

## 2023-11-10 MED ORDER — SUMATRIPTAN SUCCINATE 100 MG PO TABS: 100.0000 mg | ORAL_TABLET | Freq: Every day | ORAL | 11 refills | Status: DC | PRN

## 2023-11-10 NOTE — Patient Instructions (Addendum)
 Please stop by lab before you go If you have mychart- we will send your results within 3 business days of us  receiving them.  If you do not have mychart- we will call you about results within 5 business days of us  receiving them.  *please also note that you will see labs on mychart as soon as they post. I will later go in and write notes on them- will say "notes from Dr. Arlene Ben"   Restart amlodipine  5 mg and continue to monitor at home- if you have chest pain, shortness of breath, blurry vision, worsening headache(s) seek care immediately  Recommended follow up: Return for next already scheduled visit or sooner if needed.

## 2023-11-10 NOTE — Progress Notes (Signed)
 Phone [phone] In person visit   Subjective:   Carla West is a 63 y.o. year old very pleasant female patient who presents for/with See problem oriented charting Chief Complaint  Patient presents with   Hypertension    Pt co elevated bp readings starting 05/12 with readings ranging from 160/98 179/113, 142/88, 188/107. And c/o headaches.   Past Medical History-  Patient Active Problem List   Diagnosis Date Noted   CAD (coronary artery disease) 11/07/2020    Priority: High   Hyperthyroidism 02/26/2019    Priority: High   Essential hypertension 08/03/2015    Priority: High   Hyperglycemia 08/05/2022    Priority: Medium    Palpitations 04/11/2018    Priority: Medium    Hyperlipidemia 12/28/2017    Priority: Medium    Migraine headache 08/03/2015    Priority: Medium    Skin lesion 11/05/2020    Priority: Low   Abnormal Pap smear of cervix 08/03/2015    Priority: Low   Osteopenia of lumbar spine 12/07/2017    Medications- reviewed and updated Current Outpatient Medications  Medication Sig Dispense Refill   amLODipine  (NORVASC ) 5 MG tablet Take 1 tablet (5 mg total) by mouth daily. 90 tablet 3   aspirin  EC 81 MG tablet Take 1 tablet (81 mg total) by mouth daily. 90 tablet 3   EMGALITY  120 MG/ML SOAJ INJECT 240MG  UNDER THE SKIN ONCE FOR A STARTING DOSE 2 mL 0   ondansetron  (ZOFRAN -ODT) 4 MG disintegrating tablet Take 1 tablet (4 mg total) by mouth every 8 (eight) hours as needed. 20 tablet 5   Vitamin D , Ergocalciferol , (DRISDOL ) 1.25 MG (50000 UNIT) CAPS capsule Take 1 capsule (50,000 Units total) by mouth every 7 (seven) days. 13 capsule 1   atorvastatin  (LIPITOR ) 80 MG tablet Take 1 tablet (80 mg total) by mouth daily at 6 PM. 90 tablet 3   ezetimibe  (ZETIA ) 10 MG tablet Take 1 tablet (10 mg total) by mouth daily. 90 tablet 3   hydrOXYzine  (ATARAX ) 25 MG tablet 1 tab po q hs prn insomnia 90 tablet 3   losartan  (COZAAR ) 50 MG tablet TAKE 1 TABLET(50 MG) BY MOUTH  DAILY 90 tablet 3   methimazole  (TAPAZOLE ) 10 MG tablet Take 1 tablet (10 mg total) by mouth daily. 90 tablet 3   metoprolol  succinate (TOPROL -XL) 50 MG 24 hr tablet Take 1 tablet (50 mg total) by mouth daily. Take with or immediately following a meal. 90 tablet 3   SUMAtriptan  (IMITREX ) 100 MG tablet Take 1 tablet (100 mg total) by mouth daily as needed for migraine. 10 tablet 11   triamterene -hydrochlorothiazide  (MAXZIDE-25) 37.5-25 MG tablet Take 1 tablet by mouth daily. 90 tablet 3   No current facility-administered medications for this visit.     Objective:  BP (!) 148/90   Pulse 63   Temp (!) 97.3 F (36.3 C)   Ht 5\' 7"  (1.702 m)   Wt 168 lb 3.2 oz (76.3 kg)   SpO2 97%   BMI 26.34 kg/m  Gen: NAD, resting comfortably CV: RRR no murmurs rubs or gallops Lungs: CTAB no crackles, wheeze, rhonchi Ext: no edema Skin: warm, dry  Neuro: CN II-XII intact, sensation and reflexes normal throughout, 5/5 muscle strength in bilateral upper and lower extremities. Normal finger to nose. Normal rapid alternating movements. No pronator drift. Normal romberg. Normal gait.      Assessment and Plan    #CAD with history of stent 02/27/2019  At time of active graves disease #  hyperlipidemia-LDL goal under 70 S: Medication: aspirin  81mg , atorvastatin  80mg , zetia  added 11/07/2020  Lab Results  Component Value Date   CHOL 234 (H) 02/03/2023   HDL 83.50 02/03/2023   LDLCALC 139 (H) 02/03/2023   LDLDIRECT 165 (H) 07/03/2021   TRIG 60.0 02/03/2023   CHOLHDL 3 02/03/2023     A/P: Last LDL substantially elevated-hopefully improved on check today-only other real option would be PCSK9 inhibitors and possibly lipid clinic referral-could consider  #Resistant hypertension- hyperthyroidism possibly contributing S: medication: losartan  50mg  AM, metoprolol  50mg  XR PM, triamterene -hctz 37.5-25mg  PM Home readings #s: on 10/24/23 noted blood pressure at home increasing and noted several readings as high as  160/98, 179/113, 142/88, 188/107. When elevated noted headache(s) as well and some nausea. No chest pain or shortness of breath with episodes but feels anxious- was 188/107 last night. Considered ER last night. This morning blood pressure was 178/109 then went to work and ended up going home 159/109. Feels like migraines start first and then blood pressure up. Gets tension in neck. Took Excedrin migraine and helpful but didn't take sumatriptan  as feels jittery.  -has ordered beet pills, considering coq10 -normal renal arteries 03/12/20 A/P: Blood pressure very poorly controlled.  Had done well for some time off of amlodipine  but appears she needs to restart this medication-may have also had some migraine benefit so we will restart for that reason as well.  Had been on 10 mg but opted to try just 5 mg to start   #hyperthyroidism/graves disease-follows with Dr. Rosalea Collin S: compliant On thyroid  medication-methimazole  10mg  BID now down to just once a day A/P: Hyperthyroidism could cause elevated blood pressure-check TSH and T4-she held off on endocrinology follow-up as levels were normal last time  # Migraines S: had sample of emgality  and took in march and had no headache(s) through until may.  After that came out of her system and seems to be having more migraines but unfortunately was not covered by insurance -Excedrin helps but could be increasing blood pressure A/P: Migraine pattern worsening-had been better on Emgality  and also on amlodipine  in the past-possible amlodipine  was suppressing migraine somewhat-we are going to retrial that today -since cardiac disease thought largely related to thyrotoxicosis sumatriptan  has been thought reasonable  # Hyperglycemia/insulin resistance/prediabetes- peak a1c of 6.0 -Update A1c with labs today  Recommended follow up: Return for next already scheduled visit or sooner if needed. Future Appointments  Date Time Provider Department Center  11/16/2023  8:00  AM Almira Jaeger, MD [facility]    Lab/Order associations:   ICD-10-CM   1. Essential hypertension  I10 Comprehensive metabolic panel with GFR    CBC with Differential/Platelet    2. Hyperthyroidism  E05.90 TSH    T4, free    3. Hyperglycemia  R73.9 Hemoglobin A1c    4. Hyperlipidemia, unspecified hyperlipidemia type  E78.5 Comprehensive metabolic panel with GFR    CBC with Differential/Platelet    Direct LDL    5. Screening for diabetes mellitus  Z13.1 Hemoglobin A1c      Meds ordered this encounter  Medications   hydrOXYzine  (ATARAX ) 25 MG tablet    Sig: 1 tab po q hs prn insomnia    Dispense:  90 tablet    Refill:  3   ezetimibe  (ZETIA ) 10 MG tablet    Sig: Take 1 tablet (10 mg total) by mouth daily.    Dispense:  90 tablet    Refill:  3   atorvastatin  (LIPITOR ) 80 MG  tablet    Sig: Take 1 tablet (80 mg total) by mouth daily at 6 PM.    Dispense:  90 tablet    Refill:  3   losartan  (COZAAR ) 50 MG tablet    Sig: TAKE 1 TABLET(50 MG) BY MOUTH DAILY    Dispense:  90 tablet    Refill:  3   methimazole  (TAPAZOLE ) 10 MG tablet    Sig: Take 1 tablet (10 mg total) by mouth daily.    Dispense:  90 tablet    Refill:  3   metoprolol  succinate (TOPROL -XL) 50 MG 24 hr tablet    Sig: Take 1 tablet (50 mg total) by mouth daily. Take with or immediately following a meal.    Dispense:  90 tablet    Refill:  3   SUMAtriptan  (IMITREX ) 100 MG tablet    Sig: Take 1 tablet (100 mg total) by mouth daily as needed for migraine.    Dispense:  10 tablet    Refill:  11   triamterene -hydrochlorothiazide  (MAXZIDE-25) 37.5-25 MG tablet    Sig: Take 1 tablet by mouth daily.    Dispense:  90 tablet    Refill:  3   amLODipine  (NORVASC ) 5 MG tablet    Sig: Take 1 tablet (5 mg total) by mouth daily.    Dispense:  90 tablet    Refill:  3    Return precautions advised.  Clarisa Crooked, MD

## 2023-11-11 ENCOUNTER — Ambulatory Visit: Payer: Self-pay | Admitting: Family Medicine

## 2023-11-11 LAB — CBC WITH DIFFERENTIAL/PLATELET
Basophils Absolute: 0.1 10*3/uL (ref 0.0–0.1)
Basophils Relative: 1 % (ref 0.0–3.0)
Eosinophils Absolute: 0 10*3/uL (ref 0.0–0.7)
Eosinophils Relative: 0.7 % (ref 0.0–5.0)
HCT: 37.2 % (ref 36.0–46.0)
Hemoglobin: 12.6 g/dL (ref 12.0–15.0)
Lymphocytes Relative: 27.3 % (ref 12.0–46.0)
Lymphs Abs: 1.5 10*3/uL (ref 0.7–4.0)
MCHC: 33.9 g/dL (ref 30.0–36.0)
MCV: 86.6 fl (ref 78.0–100.0)
Monocytes Absolute: 0.4 10*3/uL (ref 0.1–1.0)
Monocytes Relative: 7.8 % (ref 3.0–12.0)
Neutro Abs: 3.5 10*3/uL (ref 1.4–7.7)
Neutrophils Relative %: 63.2 % (ref 43.0–77.0)
Platelets: 228 10*3/uL (ref 150.0–400.0)
RBC: 4.3 Mil/uL (ref 3.87–5.11)
RDW: 14.1 % (ref 11.5–15.5)
WBC: 5.5 10*3/uL (ref 4.0–10.5)

## 2023-11-11 LAB — COMPREHENSIVE METABOLIC PANEL WITH GFR
ALT: 17 U/L (ref 0–35)
AST: 21 U/L (ref 0–37)
Albumin: 4.4 g/dL (ref 3.5–5.2)
Alkaline Phosphatase: 66 U/L (ref 39–117)
BUN: 24 mg/dL — ABNORMAL HIGH (ref 6–23)
CO2: 28 meq/L (ref 19–32)
Calcium: 9.4 mg/dL (ref 8.4–10.5)
Chloride: 103 meq/L (ref 96–112)
Creatinine, Ser: 1.31 mg/dL — ABNORMAL HIGH (ref 0.40–1.20)
GFR: 44.2 mL/min — ABNORMAL LOW
Glucose, Bld: 87 mg/dL (ref 70–99)
Potassium: 3.8 meq/L (ref 3.5–5.1)
Sodium: 140 meq/L (ref 135–145)
Total Bilirubin: 0.3 mg/dL (ref 0.2–1.2)
Total Protein: 7.5 g/dL (ref 6.0–8.3)

## 2023-11-11 LAB — LDL CHOLESTEROL, DIRECT: Direct LDL: 90 mg/dL

## 2023-11-11 LAB — T4, FREE: Free T4: 0.83 ng/dL (ref 0.60–1.60)

## 2023-11-11 LAB — HEMOGLOBIN A1C: Hgb A1c MFr Bld: 6.1 % (ref 4.6–6.5)

## 2023-11-11 LAB — TSH: TSH: 2.08 u[IU]/mL (ref 0.35–5.50)

## 2023-11-16 ENCOUNTER — Encounter: Payer: Self-pay | Admitting: Family Medicine

## 2023-11-16 ENCOUNTER — Ambulatory Visit (INDEPENDENT_AMBULATORY_CARE_PROVIDER_SITE_OTHER): Payer: Self-pay | Admitting: Family Medicine

## 2023-11-16 ENCOUNTER — Other Ambulatory Visit: Payer: Self-pay

## 2023-11-16 ENCOUNTER — Ambulatory Visit: Payer: Self-pay | Admitting: Family Medicine

## 2023-11-16 VITALS — BP 130/82 | HR 67 | Temp 97.3°F | Ht 67.0 in | Wt 166.8 lb

## 2023-11-16 DIAGNOSIS — Z23 Encounter for immunization: Secondary | ICD-10-CM | POA: Diagnosis not present

## 2023-11-16 DIAGNOSIS — M8588 Other specified disorders of bone density and structure, other site: Secondary | ICD-10-CM | POA: Diagnosis not present

## 2023-11-16 DIAGNOSIS — E559 Vitamin D deficiency, unspecified: Secondary | ICD-10-CM | POA: Insufficient documentation

## 2023-11-16 DIAGNOSIS — Z0000 Encounter for general adult medical examination without abnormal findings: Secondary | ICD-10-CM | POA: Diagnosis not present

## 2023-11-16 DIAGNOSIS — Z1211 Encounter for screening for malignant neoplasm of colon: Secondary | ICD-10-CM

## 2023-11-16 DIAGNOSIS — I10 Essential (primary) hypertension: Secondary | ICD-10-CM

## 2023-11-16 LAB — URINALYSIS, ROUTINE W REFLEX MICROSCOPIC
Bilirubin Urine: NEGATIVE
Hgb urine dipstick: NEGATIVE
Ketones, ur: NEGATIVE
Leukocytes,Ua: NEGATIVE
Nitrite: NEGATIVE
Specific Gravity, Urine: 1.015 (ref 1.000–1.030)
Total Protein, Urine: NEGATIVE
Urine Glucose: NEGATIVE
Urobilinogen, UA: 0.2 (ref 0.0–1.0)
WBC, UA: NONE SEEN (ref 0–?)
pH: 7.5 (ref 5.0–8.0)

## 2023-11-16 LAB — BASIC METABOLIC PANEL WITH GFR
BUN: 21 mg/dL (ref 6–23)
CO2: 29 meq/L (ref 19–32)
Calcium: 9.2 mg/dL (ref 8.4–10.5)
Chloride: 104 meq/L (ref 96–112)
Creatinine, Ser: 1.02 mg/dL (ref 0.40–1.20)
GFR: 59.67 mL/min — ABNORMAL LOW (ref >60.00)
Glucose, Bld: 88 mg/dL (ref 70–99)
Potassium: 3.7 meq/L (ref 3.5–5.1)
Sodium: 140 meq/L (ref 135–145)

## 2023-11-16 LAB — VITAMIN D 25 HYDROXY (VIT D DEFICIENCY, FRACTURES): VITD: 49.77 ng/mL (ref 30.00–100.00)

## 2023-11-16 LAB — MICROALBUMIN / CREATININE URINE RATIO
Creatinine,U: 71.7 mg/dL
Microalb Creat Ratio: 40.3 mg/g — ABNORMAL HIGH (ref 0.0–30.0)
Microalb, Ur: 2.9 mg/dL — ABNORMAL HIGH (ref 0.0–1.9)

## 2023-11-16 MED ORDER — LOSARTAN POTASSIUM 50 MG PO TABS: ORAL_TABLET | ORAL | 3 refills | Status: DC

## 2023-11-16 NOTE — Progress Notes (Signed)
 Phone [phone]   Subjective:  Patient presents today for their annual physical. Chief complaint-noted.   See problem oriented charting- ROS- full  review of systems was completed and negative Per full ROS sheet completed by patient except for topics noted under acute/chronic concerns  The following were reviewed and entered/updated in epic: Past Medical History:  Diagnosis Date   Abnormal Pap smear of cervix 08/03/2015   Anxiety    Benign essential HTN 08/03/2015   Depression    Graves disease    History of chicken pox    Migraine headache 08/03/2015   Patient Active Problem List   Diagnosis Date Noted   CAD (coronary artery disease) 11/07/2020    Priority: High   Hyperthyroidism 02/26/2019    Priority: High   Essential hypertension 08/03/2015    Priority: High   Vitamin D  deficiency 11/16/2023    Priority: Medium    Hyperglycemia 08/05/2022    Priority: Medium    Palpitations 04/11/2018    Priority: Medium    Hyperlipidemia 12/28/2017    Priority: Medium    Osteopenia of lumbar spine 12/07/2017    Priority: Medium    Migraine headache 08/03/2015    Priority: Medium    Skin lesion 11/05/2020    Priority: Low   Abnormal Pap smear of cervix 08/03/2015    Priority: Low   Past Surgical History:  Procedure Laterality Date   CORONARY STENT INTERVENTION N/A 02/27/2019   Procedure: CORONARY STENT INTERVENTION;  Surgeon: Odie Benne, MD;  Location: [facility];  Service: Cardiovascular;  Laterality: N/A;   LEFT HEART CATH AND CORONARY ANGIOGRAPHY N/A 02/27/2019   Procedure: LEFT HEART CATH AND CORONARY ANGIOGRAPHY;  Surgeon: Odie Benne, MD;  Location: [facility];  Service: Cardiovascular;  Laterality: N/A;   TUBAL LIGATION      Family History  Problem Relation Age of Onset   Hypertension Mother    Heart disease Mother        in late 31s   Hyperlipidemia Father    Stroke Father        later in life   Breast cancer Maternal  Grandmother    Hyperlipidemia Sister    Other Brother        plan crash in [employer]   Migraines Son    Heart disease Brother        CHF, arrythmia   Liver disease Brother        alcohol   Hypertension Brother    Gout Brother    Hypertension Brother     Medications- reviewed and updated Current Outpatient Medications  Medication Sig Dispense Refill   amLODipine  (NORVASC ) 5 MG tablet Take 1 tablet (5 mg total) by mouth daily. 90 tablet 3   aspirin  EC 81 MG tablet Take 1 tablet (81 mg total) by mouth daily. 90 tablet 3   atorvastatin  (LIPITOR ) 80 MG tablet Take 1 tablet (80 mg total) by mouth daily at 6 PM. 90 tablet 3   EMGALITY  120 MG/ML SOAJ INJECT 240MG  UNDER THE SKIN ONCE FOR A STARTING DOSE 2 mL 0   ezetimibe  (ZETIA ) 10 MG tablet Take 1 tablet (10 mg total) by mouth daily. 90 tablet 3   hydrOXYzine  (ATARAX ) 25 MG tablet 1 tab po q hs prn insomnia 90 tablet 3   methimazole  (TAPAZOLE ) 10 MG tablet Take 1 tablet (10 mg total) by mouth daily. 90 tablet 3   metoprolol  succinate (TOPROL -XL) 50 MG 24 hr tablet Take 1  tablet (50 mg total) by mouth daily. Take with or immediately following a meal. 90 tablet 3   ondansetron  (ZOFRAN -ODT) 4 MG disintegrating tablet Take 1 tablet (4 mg total) by mouth every 8 (eight) hours as needed. 20 tablet 5   SUMAtriptan  (IMITREX ) 100 MG tablet Take 1 tablet (100 mg total) by mouth daily as needed for migraine. 10 tablet 11   triamterene -hydrochlorothiazide  (MAXZIDE-25) 37.5-25 MG tablet Take 1 tablet by mouth daily. 90 tablet 3   Vitamin D , Ergocalciferol , (DRISDOL ) 1.25 MG (50000 UNIT) CAPS capsule Take 1 capsule (50,000 Units total) by mouth every 7 (seven) days. 13 capsule 1   losartan  (COZAAR ) 50 MG tablet TAKE 1 TABLET(50 MG) BY MOUTH DAILY 90 tablet 3   No current facility-administered medications for this visit.    Allergies-reviewed and updated Allergies  Allergen Reactions   Elavil  [Amitriptyline  Hcl] Other (See Comments)    Jittery..the  patient states she has not had any side effects     Social History   Social History Narrative   Divorced 2018. 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 Oropharynx normal Neck: no thyromegaly CV: RRR no murmurs rubs or gallops Lungs: CTAB no crackles, wheeze, rhonchi Abdomen: soft/nontender/nondistended/normal bowel sounds. No rebound or guarding.  Ext: no edema Skin: warm, dry Neuro: grossly normal, moves all extremities, PERRLA   Assessment and Plan   63 y.o. female presenting for annual physical.  Health Maintenance counseling: 1. Anticipatory guidance: Patient counseled regarding regular dental exams -q6 months, eye exams - yearly,  avoiding smoking and second hand smoke , limiting alcohol to 1 beverage per day- no alcohol , no illicit drugs .   2. Risk factor reduction:  Advised patient of need for regular exercise and diet rich and fruits and vegetables to reduce risk of heart attack and stroke.  Exercise- not recently- set back with knee and then blood pressure went back up- wants to get restarted.  Diet/weight management-weight roughly stable in 160's in recent years but has been in 150's in this time frame and would like to get back down. Feels could reduce desserts but already cut out soda and tea.  Meal prep. She would like to see 150 in long run.  Wt Readings from Last 3 Encounters:  11/16/23 166 lb 12.8 oz (75.7 kg)  11/10/23 168 lb 3.2 oz (76.3 kg)  09/06/23 168 lb (76.2 kg)  3. Immunizations/screenings/ancillary studies- tough  reaction to COVID shot in past so holding off, offered Prevnar 20  opts in Immunization History  Administered Date(s) Administered   Influenza,inj,Quad PF,6+ Mos 02/28/2018, 03/14/2019   Moderna Sars-Covid-2 Vaccination 06/25/2019, 07/23/2019   Tdap 11/13/2015   Zoster Recombinant(Shingrix) 11/04/2020, 01/13/2022  4. Cervical cancer screening- 11/04/20 with 5 year repeat as human papilloma virus negative. No blood or discharge 5. Breast cancer screening-  breast exam - self exams- and mammogram -10/31/2023  6. Colon  cancer screening - due again now and ordered- cologuard 11/27/20  7. Skin cancer screening- lower risk due to melanin content. advised regular sunscreen use. Denies worrisome, changing, or new skin lesions.   8. Birth control/STD check- postmenopausal.  Not sexually active at moment- would use protection and always has 9. Osteoporosis screening at 65-last bone density April 28, 2018 with osteopenia and worst T-score -1.3 in AP spine-encourage weightbearing exercise, calcium  through diet, vitamin D  at least 1000 units a dayand offered repeat DEXA today at breast center- opts in  10. Smoking associated screening - never smoker  Status of chronic or acute concerns   #CAD with history of stent 02/27/2019  At time of active graves disease #hyperlipidemia-LDL goal under 70 S: Medication: aspirin  81mg , atorvastatin  80mg , zetia  added 11/07/2020 Lab Results  Component Value Date   CHOL 234 (H) 02/03/2023   HDL 83.50 02/03/2023   LDLCALC 139 (H) 02/03/2023   LDLDIRECT 90.0 11/10/2023   TRIG 60.0 02/03/2023   CHOLHDL 3 02/03/2023  A/P: CAD asymptomatic continue current medications Lipids slightly above ideal goal- focus on lifestyle as already on max dose statin and Zetia  -did discuss diabetes risk with statins   #Resistant hypertension- hyperthyroidism possibly contributing S: medication: losartan  50mg  AM, metoprolol  50mg  XR PM, triamterene -hctz 37.5-25mg  PM, amlodipine  restart mid  2025 at 5 mg- prior 10 mg but blood pressure came down -normal renal arteries 03/12/20 BP Readings from Last 3 Encounters:  11/16/23 130/82  11/10/23 (!) 148/90  09/06/23 122/82  A/P: blood pressure phenomenally improved back on amlodipine - seems to respond very well to this and also fewer headache(s) - will continue current medications along with other meds  #hyperthyroidism/graves disease-follows with Dr. Rosalea Collin S: compliant On thyroid  medication-methimazole  10mg  daily  - RAI or surgery in active consideration  A/P:well controlled continue current medications    # Migraines- already improving back on amlodipine - will monitor frequency.   # Hyperglycemia/insulin resistance/prediabetes- peak a1c of 6.0 S:  Medication: none Lab Results  Component Value Date   HGBA1C 6.1 11/10/2023   HGBA1C 6.0 02/03/2023   HGBA1C 6.0 08/05/2022   A/P: stable- continue current medicines Continue without meds for now work on lifestyle  #Vitamin D  deficiency- as low as 25 July 2022 S: Medication: none right now Last vitamin D  Lab Results  Component Value Date   VD25OH 33.71 02/03/2023  A/P: hopefully stable- update vitamin D   today. Continue current meds for now    Recommended follow up: Return in about 6 months (around 05/17/2024) for followup or sooner if needed.Schedule b4 you leave.  Lab/Order associations: fasting   ICD-10-CM   1. Preventative health care  Z00.00     2. Essential hypertension  I10 Urinalysis, Routine w reflex microscopic    Microalbumin / creatinine urine ratio    Basic Metabolic Panel (BMET)    3. Screen for colon cancer  Z12.11 Cologuard    4. Osteopenia of lumbar spine  M85.88 DG Bone Density    5. Vitamin D  deficiency  E55.9 VITAMIN D  25 Hydroxy (Vit-D Deficiency, Fractures)     Meds ordered this encounter  Medications   losartan  (COZAAR ) 50 MG tablet    Sig: TAKE 1 TABLET(50 MG) BY MOUTH DAILY    Dispense:  90 tablet    Refill:  3   Return  precautions advised.  Clarisa Crooked, MD

## 2023-11-16 NOTE — Patient Instructions (Addendum)
 Health Maintenance Due  Topic Date Due   DEXA SCAN  04/28/2021  [facility]- [facility] Schedule an appointment by calling [phone] for bone density/DEXA   Goal calcium  1200mg  a day- if you can get most through diet that's ideal- team please give handout  please let us  know if you have not received your Cologuard within 3 weeks-please complete after you receive  [email or web address]- lots of good information on healthy eating plant based  Prevnar today    Please stop by lab before you go If you have mychart- we will send your results within 3 business days of us  receiving them.  If you do not have mychart- we will call you about results within 5 business days of us  receiving them.  *please also note that you will see labs on mychart as soon as they post. I will later go in and write notes on them- will say "notes from Dr. Arlene Ben"   Recommended follow up: Return in about 6 months (around 05/17/2024) for followup or sooner if needed.Schedule b4 you leave.

## 2023-11-16 NOTE — Addendum Note (Signed)
 Addended by: Arva Lathe on: 11/16/2023 08:51 AM   Modules accepted: Orders

## 2023-12-16 DIAGNOSIS — Z1211 Encounter for screening for malignant neoplasm of colon: Secondary | ICD-10-CM | POA: Diagnosis not present

## 2023-12-24 LAB — COLOGUARD: COLOGUARD: NEGATIVE

## 2024-01-19 ENCOUNTER — Telehealth: Payer: Self-pay | Admitting: *Deleted

## 2024-01-19 ENCOUNTER — Encounter: Payer: Self-pay | Admitting: *Deleted

## 2024-01-19 NOTE — Telephone Encounter (Signed)
 Called and spoke with [facility] with [facility]. And she stated the person that is attached to this encounter as the patient is not the patient, it is the representative from [facility]. Bari stated she does not know who the patient is that Brenisha was calling about. Bari will be calling back with more information.

## 2024-01-19 NOTE — Telephone Encounter (Signed)
 This will need to be retracted from her chart then please.  Was confusing to me because I did not see a recent referral

## 2024-01-19 NOTE — Telephone Encounter (Signed)
 May delay per patient request

## 2024-01-19 NOTE — Telephone Encounter (Signed)
 Copied from CRM #[id number]. Topic: General - Other >> Jan 18, 2024  4:00 PM Yolanda T wrote: Reason for CRM: Holli from [facility] called stated patient does not want them to come out next week so they need a verbal delay order. Please call  [phone] and ask for [facility] or Bari   Please advise  [facility]

## 2024-01-19 NOTE — Telephone Encounter (Signed)
 Ticket placed to IT for telephone encounters to be removed for 01/19/2024. Tammy is aware and has reported the CRM Error.

## 2024-01-19 NOTE — Telephone Encounter (Signed)
 SABRA

## 2024-01-21 ENCOUNTER — Emergency Department (HOSPITAL_COMMUNITY)

## 2024-01-21 ENCOUNTER — Other Ambulatory Visit: Payer: Self-pay

## 2024-01-21 ENCOUNTER — Inpatient Hospital Stay (HOSPITAL_COMMUNITY)
Admission: EM | Admit: 2024-01-21 | Discharge: 2024-01-23 | DRG: 103 | Disposition: A | Discharge status: Home / Self Care | Attending: Internal Medicine | Admitting: Internal Medicine

## 2024-01-21 ENCOUNTER — Encounter (HOSPITAL_COMMUNITY): Payer: Self-pay | Admitting: Emergency Medicine

## 2024-01-21 DIAGNOSIS — R112 Nausea with vomiting, unspecified: Secondary | ICD-10-CM | POA: Diagnosis not present

## 2024-01-21 DIAGNOSIS — I517 Cardiomegaly: Secondary | ICD-10-CM | POA: Diagnosis not present

## 2024-01-21 DIAGNOSIS — I10 Essential (primary) hypertension: Secondary | ICD-10-CM | POA: Diagnosis present

## 2024-01-21 DIAGNOSIS — Z955 Presence of coronary angioplasty implant and graft: Secondary | ICD-10-CM

## 2024-01-21 DIAGNOSIS — G43109 Migraine with aura, not intractable, without status migrainosus: Secondary | ICD-10-CM | POA: Diagnosis not present

## 2024-01-21 DIAGNOSIS — R519 Headache, unspecified: Secondary | ICD-10-CM

## 2024-01-21 DIAGNOSIS — E0590 Thyrotoxicosis, unspecified without thyrotoxic crisis or storm: Secondary | ICD-10-CM | POA: Diagnosis present

## 2024-01-21 DIAGNOSIS — E039 Hypothyroidism, unspecified: Secondary | ICD-10-CM | POA: Diagnosis present

## 2024-01-21 DIAGNOSIS — I252 Old myocardial infarction: Secondary | ICD-10-CM

## 2024-01-21 DIAGNOSIS — Z79899 Other long term (current) drug therapy: Secondary | ICD-10-CM

## 2024-01-21 DIAGNOSIS — R9431 Abnormal electrocardiogram [ECG] [EKG]: Secondary | ICD-10-CM

## 2024-01-21 DIAGNOSIS — I2510 Atherosclerotic heart disease of native coronary artery without angina pectoris: Secondary | ICD-10-CM | POA: Diagnosis present

## 2024-01-21 DIAGNOSIS — Z83438 Family history of other disorder of lipoprotein metabolism and other lipidemia: Secondary | ICD-10-CM

## 2024-01-21 DIAGNOSIS — Z803 Family history of malignant neoplasm of breast: Secondary | ICD-10-CM

## 2024-01-21 DIAGNOSIS — R072 Precordial pain: Secondary | ICD-10-CM | POA: Diagnosis not present

## 2024-01-21 DIAGNOSIS — D649 Anemia, unspecified: Secondary | ICD-10-CM | POA: Diagnosis present

## 2024-01-21 DIAGNOSIS — Z820 Family history of epilepsy and other diseases of the nervous system: Secondary | ICD-10-CM

## 2024-01-21 DIAGNOSIS — R0602 Shortness of breath: Secondary | ICD-10-CM | POA: Diagnosis not present

## 2024-01-21 DIAGNOSIS — R079 Chest pain, unspecified: Secondary | ICD-10-CM | POA: Diagnosis not present

## 2024-01-21 DIAGNOSIS — Z888 Allergy status to other drugs, medicaments and biological substances status: Secondary | ICD-10-CM

## 2024-01-21 DIAGNOSIS — Z8249 Family history of ischemic heart disease and other diseases of the circulatory system: Secondary | ICD-10-CM

## 2024-01-21 DIAGNOSIS — Z7982 Long term (current) use of aspirin: Secondary | ICD-10-CM

## 2024-01-21 DIAGNOSIS — Z823 Family history of stroke: Secondary | ICD-10-CM

## 2024-01-21 DIAGNOSIS — E785 Hyperlipidemia, unspecified: Secondary | ICD-10-CM | POA: Diagnosis present

## 2024-01-21 DIAGNOSIS — R109 Unspecified abdominal pain: Secondary | ICD-10-CM | POA: Diagnosis not present

## 2024-01-21 DIAGNOSIS — K5730 Diverticulosis of large intestine without perforation or abscess without bleeding: Secondary | ICD-10-CM | POA: Diagnosis not present

## 2024-01-21 DIAGNOSIS — G43909 Migraine, unspecified, not intractable, without status migrainosus: Secondary | ICD-10-CM | POA: Diagnosis present

## 2024-01-21 LAB — COMPREHENSIVE METABOLIC PANEL WITH GFR
ALT: 24 U/L (ref 0–44)
AST: 29 U/L (ref 15–41)
Albumin: 3.9 g/dL (ref 3.5–5.0)
Alkaline Phosphatase: 63 U/L (ref 38–126)
Anion gap: 15 (ref 5–15)
BUN: 19 mg/dL (ref 6–20)
CO2: 19 mmol/L — ABNORMAL LOW (ref 22–32)
Calcium: 9 mg/dL (ref 8.9–10.3)
Chloride: 105 mmol/L (ref 98–111)
Creatinine, Ser: 1.05 mg/dL — ABNORMAL HIGH (ref 0.44–1.00)
GFR, Estimated: >60 mL/min (ref >60)
Glucose, Bld: 126 mg/dL — ABNORMAL HIGH (ref 70–99)
Potassium: 4 mmol/L (ref 3.5–5.1)
Sodium: 139 mmol/L (ref 135–145)
Total Bilirubin: 0.9 mg/dL (ref 0.0–1.2)
Total Protein: 6.8 g/dL (ref 6.5–8.1)

## 2024-01-21 LAB — CBC WITH DIFFERENTIAL/PLATELET
Abs Immature Granulocytes: 0.03 K/uL (ref 0.00–0.07)
Basophils Absolute: 0 K/uL (ref 0.0–0.1)
Basophils Relative: 0 %
Eosinophils Absolute: 0 K/uL (ref 0.0–0.5)
Eosinophils Relative: 0 %
HCT: 36.5 % (ref 36.0–46.0)
Hemoglobin: 11.8 g/dL — ABNORMAL LOW (ref 12.0–15.0)
Immature Granulocytes: 0 %
Lymphocytes Relative: 11 %
Lymphs Abs: 0.8 K/uL (ref 0.7–4.0)
MCH: 28.9 pg (ref 26.0–34.0)
MCHC: 32.3 g/dL (ref 30.0–36.0)
MCV: 89.5 fL (ref 80.0–100.0)
Monocytes Absolute: 0.4 K/uL (ref 0.1–1.0)
Monocytes Relative: 6 %
Neutro Abs: 6.1 K/uL (ref 1.7–7.7)
Neutrophils Relative %: 83 %
Platelets: 234 K/uL (ref 150–400)
RBC: 4.08 MIL/uL (ref 3.87–5.11)
RDW: 13.6 % (ref 11.5–15.5)
WBC: 7.5 K/uL (ref 4.0–10.5)
nRBC: 0 % (ref 0.0–0.2)

## 2024-01-21 LAB — TROPONIN I (HIGH SENSITIVITY)
Troponin I (High Sensitivity): 3 ng/L (ref <18)
Troponin I (High Sensitivity): 3 ng/L (ref <18)

## 2024-01-21 LAB — LIPASE, BLOOD: Lipase: 26 U/L (ref 11–51)

## 2024-01-21 LAB — MAGNESIUM: Magnesium: 1.7 mg/dL (ref 1.7–2.4)

## 2024-01-21 MED ORDER — ONDANSETRON HCL 4 MG/2ML IJ SOLN
4.0000 mg | Freq: Four times a day (QID) | INTRAMUSCULAR | Status: DC | PRN
  Administered 2024-01-22: 4 mg via INTRAVENOUS
  Filled 2024-01-21: qty 2

## 2024-01-21 MED ORDER — PANTOPRAZOLE SODIUM 40 MG IV SOLR
40.0000 mg | INTRAVENOUS | Status: DC
  Administered 2024-01-22 – 2024-01-23 (×3): 40 mg via INTRAVENOUS
  Filled 2024-01-21 (×2): qty 10

## 2024-01-21 MED ORDER — ONDANSETRON HCL 4 MG/2ML IJ SOLN
4.0000 mg | Freq: Once | INTRAMUSCULAR | Status: AC
  Administered 2024-01-21: 4 mg via INTRAVENOUS
  Filled 2024-01-21: qty 2

## 2024-01-21 MED ORDER — TRIMETHOBENZAMIDE HCL 100 MG/ML IM SOLN
200.0000 mg | Freq: Once | INTRAMUSCULAR | Status: AC
  Administered 2024-01-21: 200 mg via INTRAMUSCULAR
  Filled 2024-01-21: qty 2

## 2024-01-21 MED ORDER — HYDROXYZINE HCL 25 MG PO TABS: 25.0000 mg | ORAL_TABLET | Freq: Every evening | ORAL | Status: DC | PRN

## 2024-01-21 MED ORDER — HYOSCYAMINE SULFATE 0.125 MG SL SUBL
0.2500 mg | SUBLINGUAL_TABLET | Freq: Once | SUBLINGUAL | Status: AC
  Administered 2024-01-21: 0.25 mg via SUBLINGUAL
  Filled 2024-01-21: qty 2

## 2024-01-21 MED ORDER — DIPHENHYDRAMINE HCL 50 MG/ML IJ SOLN
12.5000 mg | Freq: Once | INTRAMUSCULAR | Status: AC
  Administered 2024-01-21: 12.5 mg via INTRAVENOUS
  Filled 2024-01-21: qty 1

## 2024-01-21 MED ORDER — ACETAMINOPHEN 325 MG PO TABS
650.0000 mg | ORAL_TABLET | ORAL | Status: DC | PRN
Start: 2024-01-21 — End: 2024-01-23

## 2024-01-21 MED ORDER — MECLIZINE HCL 25 MG PO TABS
25.0000 mg | ORAL_TABLET | Freq: Once | ORAL | Status: DC
  Filled 2024-01-21: qty 1

## 2024-01-21 MED ORDER — MORPHINE SULFATE (PF) 2 MG/ML IV SOLN
2.0000 mg | Freq: Once | INTRAVENOUS | Status: AC
  Administered 2024-01-21: 2 mg via INTRAVENOUS
  Filled 2024-01-21: qty 1

## 2024-01-21 MED ORDER — ASPIRIN 81 MG PO CHEW: 324.0000 mg | CHEWABLE_TABLET | Freq: Once | ORAL | Status: DC

## 2024-01-21 MED ORDER — ENOXAPARIN SODIUM 40 MG/0.4ML IJ SOSY
40.0000 mg | PREFILLED_SYRINGE | Freq: Every day | INTRAMUSCULAR | Status: DC
  Administered 2024-01-22 – 2024-01-23 (×3): 40 mg via SUBCUTANEOUS
  Filled 2024-01-21 (×2): qty 0.4

## 2024-01-21 MED ORDER — SUMATRIPTAN SUCCINATE 100 MG PO TABS: 100.0000 mg | ORAL_TABLET | Freq: Every day | ORAL | Status: DC | PRN

## 2024-01-21 MED ORDER — AMLODIPINE BESYLATE 5 MG PO TABS
5.0000 mg | ORAL_TABLET | Freq: Every day | ORAL | Status: DC
  Administered 2024-01-23 (×2): 5 mg via ORAL
  Filled 2024-01-21: qty 1

## 2024-01-21 MED ORDER — DEXTROSE IN LACTATED RINGERS 5 % IV SOLN: INTRAVENOUS | Status: AC

## 2024-01-21 MED ORDER — KETOROLAC TROMETHAMINE 15 MG/ML IJ SOLN
15.0000 mg | Freq: Once | INTRAMUSCULAR | Status: AC
  Administered 2024-01-21: 15 mg via INTRAVENOUS
  Filled 2024-01-21: qty 1

## 2024-01-21 MED ORDER — PROCHLORPERAZINE EDISYLATE 10 MG/2ML IJ SOLN
10.0000 mg | INTRAMUSCULAR | Status: DC | PRN
  Administered 2024-01-22 (×2): 10 mg via INTRAVENOUS
  Filled 2024-01-21 (×2): qty 2

## 2024-01-21 MED ORDER — LACTATED RINGERS IV BOLUS
1000.0000 mL | Freq: Once | INTRAVENOUS | Status: AC
  Administered 2024-01-21: 1000 mL via INTRAVENOUS

## 2024-01-21 MED ORDER — IOHEXOL 350 MG/ML SOLN
100.0000 mL | Freq: Once | INTRAVENOUS | Status: AC | PRN
  Administered 2024-01-21: 100 mL via INTRAVENOUS

## 2024-01-21 MED ORDER — METHIMAZOLE 10 MG PO TABS
10.0000 mg | ORAL_TABLET | Freq: Every day | ORAL | Status: DC
  Administered 2024-01-23 (×2): 10 mg via ORAL
  Filled 2024-01-21 (×2): qty 1

## 2024-01-21 MED ORDER — PROCHLORPERAZINE EDISYLATE 10 MG/2ML IJ SOLN
10.0000 mg | Freq: Once | INTRAMUSCULAR | Status: AC
  Administered 2024-01-21: 10 mg via INTRAVENOUS
  Filled 2024-01-21: qty 2

## 2024-01-21 MED ORDER — TRIAMTERENE-HCTZ 37.5-25 MG PO TABS
1.0000 | ORAL_TABLET | Freq: Every day | ORAL | Status: DC
  Administered 2024-01-23 (×2): 1 via ORAL
  Filled 2024-01-21: qty 1

## 2024-01-21 MED ORDER — LOSARTAN POTASSIUM 50 MG PO TABS
50.0000 mg | ORAL_TABLET | Freq: Every day | ORAL | Status: DC
  Administered 2024-01-23 (×2): 50 mg via ORAL
  Filled 2024-01-21: qty 1

## 2024-01-21 MED ORDER — ATORVASTATIN CALCIUM 80 MG PO TABS: 80.0000 mg | ORAL_TABLET | Freq: Every day | ORAL | Status: DC

## 2024-01-21 MED ORDER — METOCLOPRAMIDE HCL 5 MG/ML IJ SOLN
10.0000 mg | Freq: Once | INTRAMUSCULAR | Status: AC
  Administered 2024-01-21: 10 mg via INTRAVENOUS
  Filled 2024-01-21: qty 2

## 2024-01-21 NOTE — ED Provider Notes (Incomplete)
 [place] EMERGENCY DEPARTMENT AT [facility] Provider Note   CSN: [id number] Arrival date & time: 01/21/24  1644     Patient presents with: Chest Pain   Carla West is a 63 y.o. female.   Chest Pain  Patient is a 63 year old female presenting today for acute onset left-sided chest pain radiating into left shoulder, left upper back, left arm which she notes feels similar to her previous STEMI in the past, also reporting persistent nausea and vomiting for the last 4 hours.    States that she was at an [employer], molecular in dealing with her chronic migraine headache since this morning when she had the chest pain, nausea, vomiting happened which was accompanied with some shortness of breath.  Noted that she then felt dizzy after vomiting where she had to sit down on the ground where she continued to vomit, noting to have a bowel movement on herself during the vomiting episode. Which she clarified as bilateral blurry vision. Bowel movement she reported looked to be normal with no hematochezia, melena, diarrhea. Story was repeated and confirmed for understanding.   Reported having recently seen cardiology 2 weeks ago and is compliant with all medications.  Noted to have previously been seeing Dr. Vernice with cardiology.  Denies fever, headache, vision changes, hemoptysis, cough, hematemesis, congestion, alcohol use, drug use, hematemesis, dysuria, lower leg swelling.    Prior to Admission medications   Medication Sig Start Date End Date Taking? Authorizing Provider  amLODipine  (NORVASC ) 5 MG tablet Take 1 tablet (5 mg total) by mouth daily. 11/10/23   Katrinka Garnette KIDD, MD  aspirin  EC 81 MG tablet Take 1 tablet (81 mg total) by mouth daily. 01/29/19   Krasowski, Robert J, MD  atorvastatin  (LIPITOR ) 80 MG tablet Take 1 tablet (80 mg total) by mouth daily at 6 PM. 11/10/23   Katrinka Garnette KIDD, MD  EMGALITY  120 MG/ML SOAJ INJECT 240MG  UNDER THE SKIN ONCE FOR A STARTING DOSE 03/18/23    Skeet Juliene SAUNDERS, DO  ezetimibe  (ZETIA ) 10 MG tablet Take 1 tablet (10 mg total) by mouth daily. 11/10/23   Katrinka Garnette KIDD, MD  hydrOXYzine  (ATARAX ) 25 MG tablet 1 tab po q hs prn insomnia 11/10/23   Katrinka Garnette KIDD, MD  losartan  (COZAAR ) 50 MG tablet TAKE 1 TABLET(50 MG) BY MOUTH DAILY 11/16/23   Katrinka Garnette KIDD, MD  methimazole  (TAPAZOLE ) 10 MG tablet Take 1 tablet (10 mg total) by mouth daily. 11/10/23   Katrinka Garnette KIDD, MD  metoprolol  succinate (TOPROL -XL) 50 MG 24 hr tablet Take 1 tablet (50 mg total) by mouth daily. Take with or immediately following a meal. 11/10/23   Katrinka Garnette KIDD, MD  ondansetron  (ZOFRAN -ODT) 4 MG disintegrating tablet Take 1 tablet (4 mg total) by mouth every 8 (eight) hours as needed. 01/27/23   Skeet Juliene SAUNDERS, DO  SUMAtriptan  (IMITREX ) 100 MG tablet Take 1 tablet (100 mg total) by mouth daily as needed for migraine. 11/10/23   Katrinka Garnette KIDD, MD  triamterene -hydrochlorothiazide  (MAXZIDE -25) 37.5-25 MG tablet Take 1 tablet by mouth daily. 11/10/23 11/04/24  Katrinka Garnette KIDD, MD  Vitamin D , Ergocalciferol , (DRISDOL ) 1.25 MG (50000 UNIT) CAPS capsule Take 1 capsule (50,000 Units total) by mouth every 7 (seven) days. 01/17/23   Katrinka Garnette KIDD, MD    Allergies: Elavil  [amitriptyline  hcl]    Review of Systems  Cardiovascular:  Positive for chest pain.  All other systems reviewed and are negative.   Updated Vital Signs BP (!) 160/98  Pulse 73   Temp 98.2 F (36.8 C) (Oral)   Resp 14   SpO2 100%   Physical Exam Vitals and nursing note reviewed.  Constitutional:      General: She is not in acute distress.    Appearance: Normal appearance. She is ill-appearing (Actively vomiting profusely). She is not diaphoretic.  HENT:     Head: Normocephalic and atraumatic.  Eyes:     General: No scleral icterus.       Right eye: No discharge.        Left eye: No discharge.     Extraocular Movements: Extraocular movements intact.     Conjunctiva/sclera: Conjunctivae  normal.  Cardiovascular:     Rate and Rhythm: Normal rate and regular rhythm.     Pulses: Normal pulses.     Heart sounds: Normal heart sounds. No murmur heard.    No friction rub. No gallop.  Pulmonary:     Effort: Pulmonary effort is normal. No respiratory distress.     Breath sounds: No stridor. No wheezing, rhonchi or rales.  Chest:     Chest wall: No tenderness.  Abdominal:     General: Abdomen is flat. There is no distension.     Palpations: Abdomen is soft.     Tenderness: There is abdominal tenderness (generalized). There is guarding. There is no right CVA tenderness, left CVA tenderness or rebound.  Musculoskeletal:        General: No swelling, deformity or signs of injury.     Cervical back: Normal range of motion. No rigidity.     Right lower leg: No edema.     Left lower leg: No edema.  Skin:    General: Skin is warm and dry.     Findings: No bruising, erythema or lesion.  Neurological:     General: No focal deficit present.     Mental Status: She is alert and oriented to person, place, and time. Mental status is at baseline.     Sensory: No sensory deficit.     Motor: No weakness.     Comments: No facial asymmetry, no ataxia, no apraxia, no aphasia, no arm drift, normal sensation to both upper and lower extremities bilaterally, normal grip strength bilaterally, normal strength to both flexion and extension to both upper lower extremities 5+ bilaterally, no visual field deficits, no nystagmus.   Psychiatric:        Mood and Affect: Mood normal.     (all labs ordered are listed, but only abnormal results are displayed) Labs Reviewed  CBC WITH DIFFERENTIAL/PLATELET  COMPREHENSIVE METABOLIC PANEL WITH GFR  LIPASE, BLOOD  URINALYSIS, ROUTINE W REFLEX MICROSCOPIC  TROPONIN I (HIGH SENSITIVITY)    EKG: EKG Interpretation Date/Time:  Saturday January 21 2024 17:01:56 EDT Ventricular Rate:  69 PR Interval:  241 QRS Duration:  86 QT Interval:  548 QTC  Calculation: 588 R Axis:   68  Text Interpretation: Sinus rhythm Prolonged PR interval Nonspecific T abnormalities, inferior leads , new since last tracing Prolonged QT interval Confirmed by Randol Simmonds [phone]) on 01/21/2024 5:05:33 PM  Radiology: No results found.  Procedures   Medications Ordered in the ED  morphine  (PF) 2 MG/ML injection 2 mg (has no administration in time range)  ondansetron  (ZOFRAN ) injection 4 mg (4 mg Intravenous Given 01/21/24 1701)    Clinical Course as of 01/21/24 2308  Sat Jan 21, 2024  2303 Spoke with Cardiology who said that she is borderline LVH but that from a cardiac  viewpoint, she does not have any concern for ACS.  [CB]    Clinical Course User Index [CB] Beola Terrall RAMAN, PA-C                               Medical Decision Making Amount and/or Complexity of Data Reviewed Labs: ordered. Radiology: ordered. ECG/medicine tests: ordered.  Risk OTC drugs. Prescription drug management.   This patient is a 63 year old female who initially presented to the ED for concern for chest pain, nausea, vomiting, headache, vertigo.  Stating that she had originally had chronic migraine headaches which she had been dealing with since this morning, where she then acutely had left-sided chest pain radiating to the backaccompanied with shortness of breath where she then began having nausea and vomiting while looking at an [facility] [employer].  She then notes that she started to begin feeling dizzy which she defined as both bilateral blurry vision as well as vertigo after the persistent vomiting.  On physical exam, patient is in no acute distress, afebrile, alert and orient x 4, speaking in full sentences, nontachypneic, nontachycardic.  Notably has normal neuroexam, nonfocal.  PERRL, LCTAB, RRR, no murmur.  Did note some epigastric abdominal tenderness and guarding.  No lower leg edema noted.  Oropharynx is clear and patent.  TMs normal bilaterally.  Exam was otherwise  unremarkable.  With patient initially presenting with chest pain and only complaining of this to EMS, suspect that this is likely the cause of her symptoms today with headache and vertigo seemingly secondary to nausea and vomiting, chest pain, shortness of breath, migraine.  Also noted that she had prolonged QT appearing to have ST depression on her ECG.  Consulted with attending who saw the patient as well and recommended that patient undergo CT angio of chest, abdomen, pelvis.  Initially provided Zofran  for ECG due to nausea and vomiting with no relief.  After QT prolongation was noted, patient was right again also noted did not have any relief.    On reevaluation, patient was still having a nonfocal neuroexam.  Complaining of headache despite the Decadron  and fluids.  Labs only noted a mild increase in creatinine as well as a mild anemia of hemoglobin 9.8. Differential diagnoses prior to evaluation: The emergent differential diagnosis includes, but is not limited to,  *** . This is not an exhaustive differential.   Past Medical History / Co-morbidities / Social History: ***  Additional history: Chart reviewed. Pertinent results include:   Last heart cath on 02/27/2019 by Dr. Lonni Cash  Last echo done on 02/27/2019  Lab Tests/Imaging studies: I personally interpreted labs/imaging and the pertinent results include:  ***.   ***I agree with the radiologist interpretation.  Cardiac monitoring: EKG obtained and interpreted by myself and attending physician which shows: ***   Medications: I ordered medication including ***.  I have reviewed the patients home medicines and have made adjustments as needed.  Critical Interventions:  Social Determinants of Health:  Disposition: After consideration of the diagnostic results and the patients response to treatment, I feel that the patient would benefit from ***.   ***emergency department workup does not suggest an emergent  condition requiring admission or immediate intervention beyond what has been performed at this time. The plan is: ***. The patient is safe for discharge and has been instructed to return immediately for worsening symptoms, change in symptoms or any other concerns.   {Document critical care time when appropriate  Document review of labs and clinical decision tools ie CHADS2VASC2, etc  Document your independent review of radiology images and any outside records  Document your discussion with family members, caretakers and with consultants  Document social determinants of health affecting pt's care  Document your decision making why or why not admission, treatments were needed:[id number]:::1}   Final diagnoses:  None    ED Discharge Orders     None

## 2024-01-21 NOTE — ED Triage Notes (Signed)
 Pt here from home with c/o chest pain along with some n/v ems gave 4mg  morphine  , 4mg  zofran  , and 1 nitroglycerin , hx of stemi

## 2024-01-21 NOTE — ED Notes (Signed)
 Patient transported to CT

## 2024-01-21 NOTE — Consult Note (Addendum)
 Cardiology Consultation   Patient ID: Carla West MRN: [id number]; DOB: 1961/03/24  Admit date: 01/21/2024 Date of Consult: 01/21/2024  PCP:  Katrinka Garnette KIDD, MD   [facility] Providers Cardiologist:  Darryle ONEIDA Decent, MD        Patient Profile: Carla West is a 63 y.o. female with a hx of NSTEMI s/p PCI to LCX (2020), HTN, HLD, hyperthyroidism and migraines who is being seen 01/21/2024 for the evaluation of chest pain at the request of Dr. Randol.  History of Present Illness: [name] is accompanied by her lovely family at bedside. She reports that she was in her usual state of health until ~2 pm this afternoon when she developed a headache. The HA was associated with nausea, vomiting, paraesthesias of her LUE and photophobia. She states that these are typical of her migraine symptoms. Additionally, she developed some upper back and chest pain as well. Her chest pain has since subsided, but her back pain has persisted. She took an excedrine without relief of her symptoms. She denies syncope, presyncope, swelling, PND and orthopnea. She has a history of migraines and her current symptoms are typical of her prior migraine pains. Her last major migraine was ~ 1 yr ago per her report.   She suffered an NSTEMI back in 2020 and underwent PCI of her LCX. Her current chest pain episode is reminiscent of her prior MI chest pain. Since her MI she followed with cardiology briefly, but has not been seen in several years. She takes all of her medications as prescribed. No issues since her MI until now. She denies tobacco, ETOH and illicit drug use.   In the ED her VS were afebrile, HR 75, BP 160/98, RR 16 and satting 100% on RA. Labs notable for troponins neg x2 (3 -> 3) and hbg 11.8. Otherwise labs were unremarkable. ECG showed nonspecific ST segment changes in the inferior and precordial leads. CT C/A/P was negative for acute cardiopulmonary pathology. She was loaded with ASA in the ED.  Cardiology is consulted for further evaluation.    Past Medical History:  Diagnosis Date   Abnormal Pap smear of cervix 08/03/2015   Anxiety    Benign essential HTN 08/03/2015   Depression    Graves disease    History of chicken pox    Migraine headache 08/03/2015    Past Surgical History:  Procedure Laterality Date   CORONARY STENT INTERVENTION N/A 02/27/2019   Procedure: CORONARY STENT INTERVENTION;  Surgeon: Verlin Lonni BIRCH, MD;  Location: [facility];  Service: Cardiovascular;  Laterality: N/A;   LEFT HEART CATH AND CORONARY ANGIOGRAPHY N/A 02/27/2019   Procedure: LEFT HEART CATH AND CORONARY ANGIOGRAPHY;  Surgeon: Verlin Lonni BIRCH, MD;  Location: [facility];  Service: Cardiovascular;  Laterality: N/A;   TUBAL LIGATION       Home Medications:  Prior to Admission medications   Medication Sig Start Date End Date Taking? Authorizing Provider  amLODipine  (NORVASC ) 5 MG tablet Take 1 tablet (5 mg total) by mouth daily. 11/10/23   Katrinka Garnette KIDD, MD  aspirin  EC 81 MG tablet Take 1 tablet (81 mg total) by mouth daily. 01/29/19   Krasowski, Robert J, MD  atorvastatin  (LIPITOR ) 80 MG tablet Take 1 tablet (80 mg total) by mouth daily at 6 PM. 11/10/23   Katrinka Garnette KIDD, MD  EMGALITY  120 MG/ML SOAJ INJECT 240MG  UNDER THE SKIN ONCE FOR A STARTING DOSE 03/18/23   Skeet Juliene SAUNDERS, DO  ezetimibe  (  ZETIA ) 10 MG tablet Take 1 tablet (10 mg total) by mouth daily. 11/10/23   Katrinka Garnette KIDD, MD  hydrOXYzine  (ATARAX ) 25 MG tablet 1 tab po q hs prn insomnia 11/10/23   Katrinka Garnette KIDD, MD  losartan  (COZAAR ) 50 MG tablet TAKE 1 TABLET(50 MG) BY MOUTH DAILY 11/16/23   Katrinka Garnette KIDD, MD  methimazole  (TAPAZOLE ) 10 MG tablet Take 1 tablet (10 mg total) by mouth daily. 11/10/23   Katrinka Garnette KIDD, MD  metoprolol  succinate (TOPROL -XL) 50 MG 24 hr tablet Take 1 tablet (50 mg total) by mouth daily. Take with or immediately following a meal. 11/10/23   Katrinka Garnette KIDD, MD   ondansetron  (ZOFRAN -ODT) 4 MG disintegrating tablet Take 1 tablet (4 mg total) by mouth every 8 (eight) hours as needed. 01/27/23   Skeet Juliene SAUNDERS, DO  SUMAtriptan  (IMITREX ) 100 MG tablet Take 1 tablet (100 mg total) by mouth daily as needed for migraine. 11/10/23   Katrinka Garnette KIDD, MD  triamterene -hydrochlorothiazide  (MAXZIDE -25) 37.5-25 MG tablet Take 1 tablet by mouth daily. 11/10/23 11/04/24  Katrinka Garnette KIDD, MD  Vitamin D , Ergocalciferol , (DRISDOL ) 1.25 MG (50000 UNIT) CAPS capsule Take 1 capsule (50,000 Units total) by mouth every 7 (seven) days. 01/17/23   Katrinka Garnette KIDD, MD    Scheduled Meds:  aspirin   324 mg Oral Once   meclizine   25 mg Oral Once   Continuous Infusions:  PRN Meds:   Allergies:    Allergies  Allergen Reactions   Elavil  [Amitriptyline  Hcl] Other (See Comments)    Jittery..the patient states she has not had any side effects     Social History:   Social History   Socioeconomic History   Marital status: Divorced    Spouse name: Not on file   Number of children: Not on file   Years of education: Not on file   Highest education level: Not on file  Occupational History   Not on file  Tobacco Use   Smoking status: Never   Smokeless tobacco: Never  Vaping Use   Vaping status: Never Used  Substance and Sexual Activity   Alcohol use: No   Drug use: No   Sexual activity: Not Currently    Comment:  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[employer]  Other Topics Concern   Not on file  Social History Narrative   Divorced 2018. 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- will be at [employer] and [employer]: Enjoys netflix, going to the [employer]   Social Drivers of Corporate investment banker Strain: Not on file  Food Insecurity: Not on file  Transportation Needs: Not on file  Physical Activity: Not on file  Stress: Not on file   Social Connections: Not on file  Intimate Partner Violence: Not on file    Family History:   Family History  Problem Relation Age of Onset   Hypertension Mother    Heart disease Mother        in late 40s   Hyperlipidemia Father    Stroke Father        later in life   Breast cancer Maternal Grandmother    Hyperlipidemia Sister    Other Brother        plan crash in [employer]   Migraines Son  Heart disease Brother        CHF, arrythmia   Liver disease Brother        alcohol   Hypertension Brother    Gout Brother    Hypertension Brother      ROS:  Please see the history of present illness.  All other ROS reviewed and negative.     Physical Exam/Data: Vitals:   01/21/24 2000 01/21/24 2129 01/21/24 2207 01/21/24 2245  BP: (!) 161/107  (!) 157/100 (!) 151/99  Pulse: 80  74 82  Resp: 14  14 20   Temp:  98 F (36.7 C)    TempSrc:      SpO2: 100%  100% 98%    Intake/Output Summary (Last 24 hours) at 01/21/2024 2305 Last data filed at 01/21/2024 2013 Gross per 24 hour  Intake 1000 ml  Output --  Net 1000 ml      11/16/2023    8:04 AM 11/10/2023    2:44 PM 09/06/2023    1:27 PM  Last 3 Weights  Weight (lbs) 166 lb 12.8 oz 168 lb 3.2 oz 168 lb  Weight (kg) 75.66 kg 76.295 kg 76.204 kg     There is no height or weight on file to calculate BMI.  General:  Well nourished, well developed, laying in bed with eyes closed holding head HEENT: normocephalic, atraumatic Neck: no JVD Vascular: No carotid bruits; radial pulses 2+ bilaterally Cardiac:  normal S1, S2; RRR; no murmur, rubs or gallops Lungs:  clear to auscultation bilaterally, no wheezing, rhonchi or rales  Abd: soft, nontender, no hepatomegaly  Ext: no edema Musculoskeletal:  No deformities, grossly normal Skin: warm and dry  Neuro:  moves all extremities Psych:  Normal affect   EKG:  The EKG was personally reviewed and demonstrates:  artifact, NSR with first degree AVB, non-specific ST changes in inferior and  precordial leads.     Telemetry:  Telemetry was personally reviewed and demonstrates:  NSR  Relevant CV Studies:   LHC 02/27/2019:  Prox RCA lesion is 30% stenosed. Prox Cx lesion is 99% stenosed. Mid LAD lesion is 20% stenosed. 2nd Mrg lesion is 40% stenosed. A drug-eluting stent was successfully placed using a STENT RESOLUTE ONYX 3.5X12. Post intervention, there is a 0% residual stenosis.   1. Mild non-obstructive disease in the LAD and RCA 2. 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  3. Successful PTCA/DES x 1 proximal Circumflex    TTE 02/27/2019:  IMPRESSIONS     1. The left ventricle has normal systolic function, with an ejection  fraction of 60-65%. The cavity size was normal. There is mildly increased  left ventricular wall thickness. No evidence of left ventricular regional  wall motion abnormalities.   2. The right ventricle has normal systolc function. The cavity was  normal. There is no increase in right ventricular wall thickness.   3. Left atrial size was moderately dilated.   4. The mitral valve is abnormal. Mild thickening of the mitral valve  leaflet. Mitral valve regurgitation is mild to moderate by color flow  Doppler.   5. The aortic valve is tricuspid Mild sclerosis of the aortic valve.   6. The ascending aorta and aortic root are normal in size and structure.   7. The inferior vena cava was normal in size with <50% respiratory  variability.   Laboratory Data: High Sensitivity Troponin:   Recent Labs  Lab 01/21/24 1707 01/21/24  1939  TROPONINIHS 3 3     Chemistry Recent Labs  Lab 01/21/24 1707 01/21/24 1939  NA 139  --   K 4.0  --   CL 105  --   CO2 19*  --   GLUCOSE 126*  --   BUN 19  --   CREATININE 1.05*  --   CALCIUM  9.0  --   MG  --  1.7  GFRNONAA >60  --   ANIONGAP 15  --     Recent Labs  Lab 01/21/24 1707  PROT 6.8  ALBUMIN 3.9  AST 29  ALT 24   ALKPHOS 63  BILITOT 0.9   Lipids No results for input(s): CHOL, TRIG, HDL, LABVLDL, LDLCALC, CHOLHDL in the last 168 hours.  Hematology Recent Labs  Lab 01/21/24 1707  WBC 7.5  RBC 4.08  HGB 11.8*  HCT 36.5  MCV 89.5  MCH 28.9  MCHC 32.3  RDW 13.6  PLT 234   Thyroid  No results for input(s): TSH, FREET4 in the last 168 hours.  BNPNo results for input(s): BNP, PROBNP in the last 168 hours.  DDimer No results for input(s): DDIMER in the last 168 hours.  Radiology/Studies:  CT Angio Chest/Abd/Pel for Dissection W and/or Wo Contrast Result Date: 01/21/2024 CLINICAL DATA:  Acute aortic syndrome suspected. Chest pain and back pain. EXAM: CT ANGIOGRAPHY CHEST, ABDOMEN AND PELVIS TECHNIQUE: Non-contrast CT of the chest was initially obtained. Multidetector CT imaging through the chest, abdomen and pelvis was performed using the standard protocol during bolus administration of intravenous contrast. Multiplanar reconstructed images and MIPs were obtained and reviewed to evaluate the vascular anatomy. RADIATION DOSE REDUCTION: This exam was performed according to the departmental dose-optimization program which includes automated exposure control, adjustment of the mA and/or kV according to patient size and/or use of iterative reconstruction technique. CONTRAST:  OMNIPAQUE  IOHEXOL  350 MG/ML SOLN COMPARISON:  None Available. FINDINGS: CTA CHEST FINDINGS Cardiovascular: Preferential opacification of the thoracic aorta. No evidence of thoracic aortic aneurysm or dissection. Heart is mildly enlarged. No pericardial effusion. Mediastinum/Nodes: No enlarged mediastinal, hilar, or axillary lymph nodes. Thyroid  gland, trachea, and esophagus demonstrate no significant findings. Lungs/Pleura: There is minimal atelectasis in the right lower lobe. The lungs are otherwise clear. There is no pleural effusion or pneumothorax. Musculoskeletal: No chest wall abnormality. No acute or  significant osseous findings. Review of the MIP images confirms the above findings. CTA ABDOMEN AND PELVIS FINDINGS VASCULAR Aorta: Normal caliber aorta without aneurysm, dissection, vasculitis or significant stenosis. Celiac: Patent without evidence of aneurysm, dissection, vasculitis or significant stenosis. SMA: Patent without evidence of aneurysm, dissection, vasculitis or significant stenosis. Renals: Both renal arteries are patent without evidence of aneurysm, dissection, vasculitis, fibromuscular dysplasia or significant stenosis. IMA: Patent without evidence of aneurysm, dissection, vasculitis or significant stenosis. Inflow: Patent without evidence of aneurysm, dissection, vasculitis or significant stenosis. Veins: No obvious venous abnormality within the limitations of this arterial phase study. Review of the MIP images confirms the above findings. NON-VASCULAR Hepatobiliary: No focal liver abnormality is seen. No gallstones, gallbladder wall thickening, or biliary dilatation. Pancreas: Unremarkable. No pancreatic ductal dilatation or surrounding inflammatory changes. Spleen: Normal in size without focal abnormality. Adrenals/Urinary Tract: Adrenal glands are unremarkable. Kidneys are normal, without renal calculi, focal lesion, or hydronephrosis. Bladder is unremarkable. Stomach/Bowel: Stomach is within normal limits. Appendix appears normal. No evidence of bowel wall thickening, distention, or inflammatory changes. There is diffuse colonic diverticulosis. Lymphatic: No enlarged lymph nodes are seen. Reproductive: Uterus and  bilateral adnexa are unremarkable. Other: No abdominal wall hernia or abnormality. No abdominopelvic ascites. Musculoskeletal: No acute or significant osseous findings. Review of the MIP images confirms the above findings. IMPRESSION: 1. No evidence for aortic dissection or aneurysm. 2. Mild cardiomegaly. 3. Colonic diverticulosis. Electronically Signed   By: Greig Pique M.D.   On:  01/21/2024 19:38   DG Chest 2 View Result Date: 01/21/2024 CLINICAL DATA:  Chest pain and shortness of breath EXAM: CHEST - 2 VIEW COMPARISON:  Chest x-ray 01/02/2022 FINDINGS: Patient is slightly rotated. The heart size and mediastinal contours are within normal limits. Both lungs are clear. The visualized skeletal structures are unremarkable. IMPRESSION: No active cardiopulmonary disease. Electronically Signed   By: Greig Pique M.D.   On: 01/21/2024 18:10     Assessment and Plan:  Carla West is a 63 y.o. female with a hx of NSTEMI s/p PCI to LCX (2020), HTN, HLD, hyperthyroidism and migraines who is being seen 01/21/2024 for the evaluation of chest pain at the request of Dr. Randol.  #Non-Cardiac Chest Pain #Migraine Headache w/ Aura ::Patient presenting with migraine headache with associated chest pain that has resolved. Troponins are negative x2. ECG with non-specific changes. CT imaging of chest without cardiopulmonary pathology. I suspect that all of her symptoms are attributable to her migraine and the fact that she was vomiting multiple times. Primary team ordered an echo which is not unreasonable. I recommend strong migraine cocktail for migraine relief. -no additional cardiac work up required at this time -I recommend strong migraine cocktail for treatment of migraine    Risk Assessment/Risk Scores:              For questions or updates, please contact  [facility] Please consult [email or web address] for contact info under    Signed, Georganna Archer, MD  01/21/2024 11:05 PM

## 2024-01-21 NOTE — H&P (Signed)
 History and Physical    Patient: Carla West FMW:[id number] DOB: 1961-01-20 DOA: 01/21/2024 DOS: the patient was seen and examined on 01/21/2024 PCP: Katrinka Garnette KIDD, MD  Patient coming from: Home  Chief Complaint:  Chief Complaint  Patient presents with   Chest Pain   HPI: Carla West is a 63 y.o. female with medical history significant of coronary artery disease status post NSTEMI back in 2020, history of Graves' disease with hypothyroidism, anxiety disorder, benign essential hypertension, history of chronic migraine headaches, who has not had close follow-up with cardiology since her MI but presented today with intractable nausea vomiting and chest pain.  Patient also having some headaches that initially thought this is her migraine headaches.  She has been given multiple doses of Zofran  and meclizine  but no relief.  Patient chest pain has persisted.  EKG showed some nonspecific lateral T wave changes and prolonged QTc.  Enzymes however are flat.  At this point cardiology was consulted.  Patient being admitted with chest pain but also manage of intractable nausea vomiting.   Review of Systems: As mentioned in the history of present illness. All other systems reviewed and are negative. Past Medical History:  Diagnosis Date   Abnormal Pap smear of cervix 08/03/2015   Anxiety    Benign essential HTN 08/03/2015   Depression    Graves disease    History of chicken pox    Migraine headache 08/03/2015   Past Surgical History:  Procedure Laterality Date   CORONARY STENT INTERVENTION N/A 02/27/2019   Procedure: CORONARY STENT INTERVENTION;  Surgeon: Verlin Lonni BIRCH, MD;  Location: [facility];  Service: Cardiovascular;  Laterality: N/A;   LEFT HEART CATH AND CORONARY ANGIOGRAPHY N/A 02/27/2019   Procedure: LEFT HEART CATH AND CORONARY ANGIOGRAPHY;  Surgeon: Verlin Lonni BIRCH, MD;  Location: [facility];  Service: Cardiovascular;  Laterality: N/A;   TUBAL  LIGATION     Social History:  reports that she has never smoked. She has never used smokeless tobacco. She reports that she does not drink alcohol and does not use drugs.  Allergies  Allergen Reactions   Elavil  [Amitriptyline  Hcl] Other (See Comments)    Jittery..the patient states she has not had any side effects     Family History  Problem Relation Age of Onset   Hypertension Mother    Heart disease Mother        in late 62s   Hyperlipidemia Father    Stroke Father        later in life   Breast cancer Maternal Grandmother    Hyperlipidemia Sister    Other Brother        plan crash in [employer]   Migraines Son    Heart disease Brother        CHF, arrythmia   Liver disease Brother        alcohol   Hypertension Brother    Gout Brother    Hypertension Brother     Prior to Admission medications   Medication Sig Start Date End Date Taking? Authorizing Provider  amLODipine  (NORVASC ) 5 MG tablet Take 1 tablet (5 mg total) by mouth daily. 11/10/23   Katrinka Garnette KIDD, MD  aspirin  EC 81 MG tablet Take 1 tablet (81 mg total) by mouth daily. 01/29/19   Krasowski, Robert J, MD  atorvastatin  (LIPITOR ) 80 MG tablet Take 1 tablet (80 mg total) by mouth daily at 6 PM. 11/10/23   Katrinka Garnette KIDD, MD  EMGALITY   120 MG/ML SOAJ INJECT 240MG  UNDER THE SKIN ONCE FOR A STARTING DOSE 03/18/23   Skeet, Adam R, DO  ezetimibe  (ZETIA ) 10 MG tablet Take 1 tablet (10 mg total) by mouth daily. 11/10/23   Katrinka Garnette KIDD, MD  hydrOXYzine  (ATARAX ) 25 MG tablet 1 tab po q hs prn insomnia 11/10/23   Katrinka Garnette KIDD, MD  losartan  (COZAAR ) 50 MG tablet TAKE 1 TABLET(50 MG) BY MOUTH DAILY 11/16/23   Katrinka Garnette KIDD, MD  methimazole  (TAPAZOLE ) 10 MG tablet Take 1 tablet (10 mg total) by mouth daily. 11/10/23   Katrinka Garnette KIDD, MD  metoprolol  succinate (TOPROL -XL) 50 MG 24 hr tablet Take 1 tablet (50 mg total) by mouth daily. Take with or immediately following a meal. 11/10/23   Katrinka Garnette KIDD, MD  ondansetron   (ZOFRAN -ODT) 4 MG disintegrating tablet Take 1 tablet (4 mg total) by mouth every 8 (eight) hours as needed. 01/27/23   Skeet Juliene SAUNDERS, DO  SUMAtriptan  (IMITREX ) 100 MG tablet Take 1 tablet (100 mg total) by mouth daily as needed for migraine. 11/10/23   Katrinka Garnette KIDD, MD  triamterene -hydrochlorothiazide  (MAXZIDE -25) 37.5-25 MG tablet Take 1 tablet by mouth daily. 11/10/23 11/04/24  Katrinka Garnette KIDD, MD  Vitamin D , Ergocalciferol , (DRISDOL ) 1.25 MG (50000 UNIT) CAPS capsule Take 1 capsule (50,000 Units total) by mouth every 7 (seven) days. 01/17/23   Katrinka Garnette KIDD, MD    Physical Exam: Vitals:   01/21/24 2000 01/21/24 2129 01/21/24 2207 01/21/24 2245  BP: (!) 161/107  (!) 157/100 (!) 151/99  Pulse: 80  74 82  Resp: 14  14 20   Temp:  98 F (36.7 C)    TempSrc:      SpO2: 100%  100% 98%   Constitutional: Acutely ill looking, NAD, calm, comfortable Eyes: PERRL, lids and conjunctivae normal ENMT: Mucous membranes are dry posterior pharynx clear of any exudate or lesions.Normal dentition.  Neck: normal, supple, no masses, no thyromegaly Respiratory: clear to auscultation bilaterally, no wheezing, no crackles. Normal respiratory effort. No accessory muscle use.  Cardiovascular: Sinus tachycardia, no murmurs / rubs / gallops. No extremity edema. 2+ pedal pulses. No carotid bruits.  Abdomen: no tenderness, no masses palpated. No hepatosplenomegaly. Bowel sounds positive.  Musculoskeletal: Good range of motion, no joint swelling or tenderness, Skin: no rashes, lesions, ulcers. No induration Neurologic: CN 2-12 grossly intact. Sensation intact, DTR normal. Strength 5/5 in all 4.  Psychiatric: Normal judgment and insight. Alert and oriented x 3. Normal mood  Data Reviewed:  Blood pressure 161/107, pulse 101, hemoglobin 11.8 CO2 of 19 creatinine 1.05.  EKG showed prolonged QT interval, evidence of LVH with lateral T wave inversions.  No old EKG To compare.  CT angiogram of the chest showed no  acute findings  Assessment and Plan:  #1 intractable nausea with vomiting: Probably multifactorial.  In the setting of chest pain acute coronary syndrome being considered.  Patient however has negative enzymes.  It could be related to her migraine headaches.  It could also be viral or any GI source.  At this point we will admit the patient for observation.  She has prolonged QT so we will be careful with the Zofran  and the Phenergan .  I will try Compazine .  She is also tried meclizine  with no relief.  IV fluid and supportive care.  #2 chest pain: In the setting of known coronary artery disease.  Cardiology has been consulted.  Will get echocardiogram.  Will defer to further recommendations to cardiology.  #  3 coronary artery disease: No coronary artery decompensation.  Continue to monitor  #4 hyperlipidemia: Continue statin once she is able to take p.o.'s  #5 essential hypertension: Blood pressure is elevated.  Resume home regimen.  We may try IV route if patient unable to take pills.  #6 migraine headaches: Patient is having headache at the moment.  Will try conservative measures.  If persistent will get neurology consult.    Advance Care Planning:   Code Status: Full Code   Consults: [place] cardiologist  Family Communication: No family at bedside  Severity of Illness: The appropriate patient status for this patient is OBSERVATION. Observation status is judged to be reasonable and necessary in order to provide the required intensity of service to ensure the patient's safety. The patient's presenting symptoms, physical exam findings, and initial radiographic and laboratory data in the context of their medical condition is felt to place them at decreased risk for further clinical deterioration. Furthermore, it is anticipated that the patient will be medically stable for discharge from the hospital within 2 midnights of admission.   AuthorBETHA SIM KNOLL, MD 01/21/2024 11:01 PM  For on call  review [email or web address].

## 2024-01-21 NOTE — ED Provider Notes (Signed)
 [place] [facility] AT [facility] Provider Note   CSN: [id number] Arrival date & time: 01/21/24  1644     Patient presents with: Chest Pain   Carla West is a 63 y.o. female.   Chest Pain  Patient is a 64 year old female presenting today for acute onset left-sided chest pain radiating into left shoulder, left upper back, left arm which she notes feels similar to her previous STEMI in the past, also reporting persistent nausea and vomiting for the last 4 hours.    States that she was at an [employer] in dealing with her chronic migraine headache since this morning when she had the chest pain, nausea, vomiting happened which was accompanied with some shortness of breath.  Noted that she then felt dizzy after vomiting where she had to sit down on the ground where she continued to vomit, noting to have a bowel movement on herself during the vomiting episode. Which she clarified as bilateral blurry vision. Bowel movement she reported looked to be normal with no hematochezia, melena, diarrhea. Story was repeated and confirmed for understanding.   Reported having recently seen cardiology 2 weeks ago and is compliant with all medications.  Noted to have previously been seeing Dr. Vernice with cardiology.  Denies fever, headache, vision changes, hemoptysis, cough, hematemesis, congestion, alcohol use, drug use, hematemesis, dysuria, lower leg swelling.    Prior to Admission medications   Medication Sig Start Date End Date Taking? Authorizing Provider  amLODipine  (NORVASC ) 5 MG tablet Take 1 tablet (5 mg total) by mouth daily. 11/10/23   Katrinka Garnette KIDD, MD  aspirin  EC 81 MG tablet Take 1 tablet (81 mg total) by mouth daily. 01/29/19   Krasowski, Robert J, MD  atorvastatin  (LIPITOR ) 80 MG tablet Take 1 tablet (80 mg total) by mouth daily at 6 PM. 11/10/23   Katrinka Garnette KIDD, MD  EMGALITY  120 MG/ML SOAJ INJECT 240MG  UNDER THE SKIN ONCE FOR A STARTING DOSE 03/18/23    Skeet Juliene SAUNDERS, DO  ezetimibe  (ZETIA ) 10 MG tablet Take 1 tablet (10 mg total) by mouth daily. 11/10/23   Katrinka Garnette KIDD, MD  hydrOXYzine  (ATARAX ) 25 MG tablet 1 tab po q hs prn insomnia 11/10/23   Katrinka Garnette KIDD, MD  losartan  (COZAAR ) 50 MG tablet TAKE 1 TABLET(50 MG) BY MOUTH DAILY 11/16/23   Katrinka Garnette KIDD, MD  methimazole  (TAPAZOLE ) 10 MG tablet Take 1 tablet (10 mg total) by mouth daily. 11/10/23   Katrinka Garnette KIDD, MD  metoprolol  succinate (TOPROL -XL) 50 MG 24 hr tablet Take 1 tablet (50 mg total) by mouth daily. Take with or immediately following a meal. 11/10/23   Katrinka Garnette KIDD, MD  ondansetron  (ZOFRAN -ODT) 4 MG disintegrating tablet Take 1 tablet (4 mg total) by mouth every 8 (eight) hours as needed. 01/27/23   Skeet Juliene SAUNDERS, DO  SUMAtriptan  (IMITREX ) 100 MG tablet Take 1 tablet (100 mg total) by mouth daily as needed for migraine. 11/10/23   Katrinka Garnette KIDD, MD  triamterene -hydrochlorothiazide  (MAXZIDE -25) 37.5-25 MG tablet Take 1 tablet by mouth daily. 11/10/23 11/04/24  Katrinka Garnette KIDD, MD  Vitamin D , Ergocalciferol , (DRISDOL ) 1.25 MG (50000 UNIT) CAPS capsule Take 1 capsule (50,000 Units total) by mouth every 7 (seven) days. 01/17/23   Katrinka Garnette KIDD, MD    Allergies: Elavil  [amitriptyline  hcl]    Review of Systems  Cardiovascular:  Positive for chest pain.  All other systems reviewed and are negative.   Updated Vital Signs BP (!) 162/92  Pulse 76   Temp 98 F (36.7 C)   Resp 16   Ht 5' 7 (1.702 m)   Wt 75.7 kg   SpO2 99%   BMI 26.14 kg/m   Physical Exam Vitals and nursing note reviewed.  Constitutional:      General: She is not in acute distress.    Appearance: Normal appearance. She is ill-appearing (Actively vomiting profusely). She is not diaphoretic.  HENT:     Head: Normocephalic and atraumatic.  Eyes:     General: No scleral icterus.       Right eye: No discharge.        Left eye: No discharge.     Extraocular Movements: Extraocular movements  intact.     Conjunctiva/sclera: Conjunctivae normal.  Cardiovascular:     Rate and Rhythm: Normal rate and regular rhythm.     Pulses: Normal pulses.     Heart sounds: Normal heart sounds. No murmur heard.    No friction rub. No gallop.  Pulmonary:     Effort: Pulmonary effort is normal. No respiratory distress.     Breath sounds: No stridor. No wheezing, rhonchi or rales.  Chest:     Chest wall: No tenderness.  Abdominal:     General: Abdomen is flat. There is no distension.     Palpations: Abdomen is soft.     Tenderness: There is abdominal tenderness (generalized). There is guarding. There is no right CVA tenderness, left CVA tenderness or rebound.  Musculoskeletal:        General: No swelling, deformity or signs of injury.     Cervical back: Normal range of motion. No rigidity.     Right lower leg: No edema.     Left lower leg: No edema.  Skin:    General: Skin is warm and dry.     Findings: No bruising, erythema or lesion.  Neurological:     General: No focal deficit present.     Mental Status: She is alert and oriented to person, place, and time. Mental status is at baseline.     Sensory: No sensory deficit.     Motor: No weakness.     Comments: No facial asymmetry, no ataxia, no apraxia, no aphasia, no arm drift, normal sensation to both upper and lower extremities bilaterally, normal grip strength bilaterally, normal strength to both flexion and extension to both upper lower extremities 5+ bilaterally, no visual field deficits, no nystagmus.   Psychiatric:        Mood and Affect: Mood normal.     (all labs ordered are listed, but only abnormal results are displayed) Labs Reviewed  CBC WITH DIFFERENTIAL/PLATELET - Abnormal; Notable for the following components:      Result Value   Hemoglobin 11.8 (*)    All other components within normal limits  COMPREHENSIVE METABOLIC PANEL WITH GFR - Abnormal; Notable for the following components:   CO2 19 (*)    Glucose, Bld 126  (*)    Creatinine, Ser 1.05 (*)    All other components within normal limits  LIPASE, BLOOD  MAGNESIUM   URINALYSIS, ROUTINE W REFLEX MICROSCOPIC  HIV ANTIBODY (ROUTINE TESTING W REFLEX)  CBC  CREATININE, SERUM  LIPID PANEL  TROPONIN I (HIGH SENSITIVITY)  TROPONIN I (HIGH SENSITIVITY)    EKG: EKG Interpretation Date/Time:  Saturday January 21 2024 23:21:31 EDT Ventricular Rate:  69 PR Interval:  261 QRS Duration:  87 QT Interval:  400 QTC Calculation: 429 R Axis:   55  Text Interpretation: Sinus rhythm Prolonged PR interval Nonspecific T abnormalities, inferior leads No significant change since last tracing Confirmed by Randol Simmonds [phone]) on 01/21/2024 11:27:09 PM  Radiology: CT Angio Chest/Abd/Pel for Dissection W and/or Wo Contrast Result Date: 01/21/2024 CLINICAL DATA:  Acute aortic syndrome suspected. Chest pain and back pain. EXAM: CT ANGIOGRAPHY CHEST, ABDOMEN AND PELVIS TECHNIQUE: Non-contrast CT of the chest was initially obtained. Multidetector CT imaging through the chest, abdomen and pelvis was performed using the standard protocol during bolus administration of intravenous contrast. Multiplanar reconstructed images and MIPs were obtained and reviewed to evaluate the vascular anatomy. RADIATION DOSE REDUCTION: This exam was performed according to the departmental dose-optimization program which includes automated exposure control, adjustment of the mA and/or kV according to patient size and/or use of iterative reconstruction technique. CONTRAST:  OMNIPAQUE  IOHEXOL  350 MG/ML SOLN COMPARISON:  None Available. FINDINGS: CTA CHEST FINDINGS Cardiovascular: Preferential opacification of the thoracic aorta. No evidence of thoracic aortic aneurysm or dissection. Heart is mildly enlarged. No pericardial effusion. Mediastinum/Nodes: No enlarged mediastinal, hilar, or axillary lymph nodes. Thyroid  gland, trachea, and esophagus demonstrate no significant findings. Lungs/Pleura: There is  minimal atelectasis in the right lower lobe. The lungs are otherwise clear. There is no pleural effusion or pneumothorax. Musculoskeletal: No chest wall abnormality. No acute or significant osseous findings. Review of the MIP images confirms the above findings. CTA ABDOMEN AND PELVIS FINDINGS VASCULAR Aorta: Normal caliber aorta without aneurysm, dissection, vasculitis or significant stenosis. Celiac: Patent without evidence of aneurysm, dissection, vasculitis or significant stenosis. SMA: Patent without evidence of aneurysm, dissection, vasculitis or significant stenosis. Renals: Both renal arteries are patent without evidence of aneurysm, dissection, vasculitis, fibromuscular dysplasia or significant stenosis. IMA: Patent without evidence of aneurysm, dissection, vasculitis or significant stenosis. Inflow: Patent without evidence of aneurysm, dissection, vasculitis or significant stenosis. Veins: No obvious venous abnormality within the limitations of this arterial phase study. Review of the MIP images confirms the above findings. NON-VASCULAR Hepatobiliary: No focal liver abnormality is seen. No gallstones, gallbladder wall thickening, or biliary dilatation. Pancreas: Unremarkable. No pancreatic ductal dilatation or surrounding inflammatory changes. Spleen: Normal in size without focal abnormality. Adrenals/Urinary Tract: Adrenal glands are unremarkable. Kidneys are normal, without renal calculi, focal lesion, or hydronephrosis. Bladder is unremarkable. Stomach/Bowel: Stomach is within normal limits. Appendix appears normal. No evidence of bowel wall thickening, distention, or inflammatory changes. There is diffuse colonic diverticulosis. Lymphatic: No enlarged lymph nodes are seen. Reproductive: Uterus and bilateral adnexa are unremarkable. Other: No abdominal wall hernia or abnormality. No abdominopelvic ascites. Musculoskeletal: No acute or significant osseous findings. Review of the MIP images confirms the  above findings. IMPRESSION: 1. No evidence for aortic dissection or aneurysm. 2. Mild cardiomegaly. 3. Colonic diverticulosis. Electronically Signed   By: Greig Pique M.D.   On: 01/21/2024 19:38   DG Chest 2 View Result Date: 01/21/2024 CLINICAL DATA:  Chest pain and shortness of breath EXAM: CHEST - 2 VIEW COMPARISON:  Chest x-ray 01/02/2022 FINDINGS: Patient is slightly rotated. The heart size and mediastinal contours are within normal limits. Both lungs are clear. The visualized skeletal structures are unremarkable. IMPRESSION: No active cardiopulmonary disease. Electronically Signed   By: Greig Pique M.D.   On: 01/21/2024 18:10    Procedures   Medications Ordered in the ED  aspirin  chewable tablet 324 mg (324 mg Oral Patient Refused/Not Given 01/21/24 1717)  meclizine  (ANTIVERT ) tablet 25 mg (25 mg Oral Patient Refused/Not Given 01/21/24 2128)  SUMAtriptan  (IMITREX ) tablet 100 mg (  has no administration in time range)  amLODipine  (NORVASC ) tablet 5 mg (has no administration in time range)  atorvastatin  (LIPITOR ) tablet 80 mg (has no administration in time range)  losartan  (COZAAR ) tablet 50 mg (has no administration in time range)  triamterene -hydrochlorothiazide  (MAXZIDE -25) 37.5-25 MG per tablet 1 tablet (has no administration in time range)  hydrOXYzine  (ATARAX ) tablet 25 mg (has no administration in time range)  methimazole  (TAPAZOLE ) tablet 10 mg (has no administration in time range)  acetaminophen  (TYLENOL ) tablet 650 mg (has no administration in time range)  ondansetron  (ZOFRAN ) injection 4 mg (has no administration in time range)  enoxaparin  (LOVENOX ) injection 40 mg (has no administration in time range)  pantoprazole  (PROTONIX ) injection 40 mg (has no administration in time range)  dextrose  5 % in lactated ringers  infusion (0 mLs Intravenous Hold 01/21/24 2328)  prochlorperazine  (COMPAZINE ) injection 10 mg (has no administration in time range)  ondansetron  (ZOFRAN ) injection 4 mg (4  mg Intravenous Given 01/21/24 1701)  morphine  (PF) 2 MG/ML injection 2 mg (2 mg Intravenous Given 01/21/24 1717)  trimethobenzamide  (TIGAN ) injection 200 mg (200 mg Intramuscular Given 01/21/24 1809)  lactated ringers  bolus 1,000 mL (0 mLs Intravenous Stopped 01/21/24 2013)  iohexol  (OMNIPAQUE ) 350 MG/ML injection 100 mL (100 mLs Intravenous Contrast Given 01/21/24 1923)  metoCLOPramide  (REGLAN ) injection 10 mg (10 mg Intravenous Given 01/21/24 2126)  lactated ringers  bolus 1,000 mL (1,000 mLs Intravenous New Bag/Given 01/21/24 2127)  hyoscyamine  (LEVSIN  SL) SL tablet 0.25 mg (0.25 mg Sublingual Given 01/21/24 2344)  diphenhydrAMINE  (BENADRYL ) injection 12.5 mg (12.5 mg Intravenous Given 01/21/24 2337)  ketorolac  (TORADOL ) 15 MG/ML injection 15 mg (15 mg Intravenous Given 01/21/24 2339)  prochlorperazine  (COMPAZINE ) injection 10 mg (10 mg Intravenous Given 01/21/24 2341)    Clinical Course as of 01/22/24 0006  Sat Jan 21, 2024  2303 Spoke with Cardiology who said that she is borderline LVH but that from a cardiac viewpoint, she does not have any concern for ACS.  [CB]    Clinical Course User Index [CB] Beola Terrall RAMAN, PA-C                               Medical Decision Making Amount and/or Complexity of Data Reviewed Labs: ordered. Radiology: ordered. ECG/medicine tests: ordered.  Risk OTC drugs. Prescription drug management.   This patient is a 63 year old female who initially presented to the ED for concern for chest pain, nausea, vomiting, headache, vertigo.  Stating that she had originally had chronic migraine headaches which she had been dealing with since this morning, where she then acutely had left-sided chest pain radiating to the backaccompanied with shortness of breath where she then began having nausea and vomiting while looking at an [employer].  She then notes that she started to begin feeling dizzy which she defined as both bilateral blurry vision as well as vertigo after the persistent  vomiting.  She reported seeing cardiology 2 weeks ago however upon review of her charts, does not appear that she had seen cardiology since 2021.  On physical exam, patient is in no acute distress, afebrile, alert and orient x 4, speaking in full sentences, nontachypneic, nontachycardic.  Notably has normal neuroexam, nonfocal.  PERRL, LCTAB, RRR, no murmur.  Did note some epigastric abdominal tenderness and guarding.  No lower leg edema noted.  Oropharynx is clear and patent.  TMs normal bilaterally.  Exam was otherwise unremarkable.  With patient initially presenting with chest  pain and only complaining of this to EMS, suspect that this is likely the cause of her symptoms today with headache and vertigo seemingly secondary to nausea and vomiting, chest pain, shortness of breath, migraine.  Also noted that she had prolonged QT appearing to have ST depression on her ECG.  Consulted with attending who saw the patient as well and recommended that patient undergo CT angio of chest, abdomen, pelvis.  Initially provided Zofran  for ECG due to nausea and vomiting with no relief.  After QT prolongation was noted, patient was right again also noted did not have any relief.    On reevaluation, patient was still having a nonfocal neuroexam.  Complaining of headache despite the Decadron  and fluids.  Labs only noted a mild increase in creatinine as well as a mild anemia of hemoglobin 9.8.  CTA did not show any acute findings IR as cause of patient symptoms today.  Cardiology, Dr. Floretta, was called and saw the patient as well who did not believe that the patient was undergoing any ACS or requiring further cardiac workup at this time.  With patient still experiencing persistent vertigo and headache, growing to be worse than her typical migraine headache, CT was ordered of the head however awaiting due to patient's recent CTA.  With patient's persistent vomiting as well as intractable headache and vertigo, with  CT pending, patient care was transferred to Dr. Sim at this time.  Differential diagnoses prior to evaluation: The emergent differential diagnosis includes, but is not limited to, gastritis, gastroenteritis, diverticulitis, pancreatitis, UTI, PID, ACS, AAS, Pulmonary Embolism, Tension Pneumothorax, Esophageal Rupture, Cardiac Tamponade, Pericarditis, Myocarditis, Pneumothorax, Pneumonia, Aortic Stenosis, CHF Exacerbation, GERD,  Esophageal Spasm,  Mallory-Weiss, Costochondritis, Musculoskeletal Chest Wall Pain, Anxiety / Panic Attack tension headache, migraine, polypharmacy, substance abuse, sinusitis, cervicogenic headache, dehydration, cluster headache, trigeminal neuralgia, IIH, PRES syndrome, intracranial bleed, CVA. This is not an exhaustive differential.   Past Medical History / Co-morbidities / Social History: HTN, migraine headaches, CAD, STEMI with cardiac stenting in 2020, palpitations, Graves' disease, anxiety  Additional history: Chart reviewed. Pertinent results include:   Last heart cath on 02/27/2019 by Dr. Lonni Cash  Last echo done on 02/27/2019  Lab Tests/Imaging studies: I personally interpreted labs/imaging and the pertinent results include:   CBC notes mild anemia CMP notes a mildly elevated creatinine of 0.05 Magnesium  unremarkable Lipase unremarkable Troponin negative with delta troponin also negative CT angiogram of the chest abdomen pelvis shows Cardiomegaly as well as colon diverticulosis with no other acute findings Chest x-ray unremarkable.I agree with the radiologist interpretation.  Cardiac monitoring: EKG obtained and interpreted by myself and attending physician which shows: Sinus rhythm with prolonged PR and QTc  EKG Interpretation Date/Time:  Saturday January 21 2024 23:21:31 EDT Ventricular Rate:  69 PR Interval:  261 QRS Duration:  87 QT Interval:  400 QTC Calculation: 429 R Axis:   55  Text Interpretation: Sinus rhythm Prolonged PR  interval Nonspecific T abnormalities, inferior leads No significant change since last tracing Confirmed by Randol Simmonds [phone]) on 01/21/2024 11:27:09 PM          Medications: I ordered medication including Decadron , morphine , LR.  I have reviewed the patients home medicines and have made adjustments as needed.  Critical Interventions: None  Social Determinants of Health: None  Consults: Spoke with cardiology, Dr. Floretta as well as internal medicine with speaking to Dr. Sim  Disposition: After consideration of the diagnostic results and the patients response to treatment, I feel that  the patient would benefit from admission, with further evaluation for persistent nausea and vomiting as well as intractable headache with CT scan pending for vertigo, patient care transferred to Dr. Sim at this time. Final diagnoses:  Precordial pain  Acute intractable headache, unspecified headache type  Prolonged Q-T interval on ECG  Nausea and vomiting, unspecified vomiting type    ED Discharge Orders     None       Portions of this report may have been transcribed using voice recognition software. Every effort was made to ensure accuracy; however, inadvertent computerized transcription errors may be present.    Beola Terrall RAMAN, PA-C 01/22/24 VALERY    Randol Simmonds, MD 01/23/24 [phone]

## 2024-01-21 NOTE — ED Provider Notes (Signed)
 I provided a substantive portion of the care of this patient.  I personally made/approved the management plan for this patient and take responsibility for the patient management.  EKG Interpretation Date/Time:  Saturday January 21 2024 17:01:56 EDT Ventricular Rate:  69 PR Interval:  241 QRS Duration:  86 QT Interval:  548 QTC Calculation: 588 R Axis:   68  Text Interpretation: Sinus rhythm Prolonged PR interval Nonspecific T abnormalities, inferior leads , new since last tracing Prolonged QT interval Confirmed by Randol Simmonds [phone]) on 01/21/2024 5:05:33 PM    Randol Simmonds, MD 01/21/24 2339

## 2024-01-22 ENCOUNTER — Observation Stay (HOSPITAL_COMMUNITY)

## 2024-01-22 DIAGNOSIS — I361 Nonrheumatic tricuspid (valve) insufficiency: Secondary | ICD-10-CM | POA: Diagnosis not present

## 2024-01-22 DIAGNOSIS — G43809 Other migraine, not intractable, without status migrainosus: Secondary | ICD-10-CM | POA: Diagnosis not present

## 2024-01-22 DIAGNOSIS — R079 Chest pain, unspecified: Secondary | ICD-10-CM

## 2024-01-22 DIAGNOSIS — Z79899 Other long term (current) drug therapy: Secondary | ICD-10-CM | POA: Diagnosis not present

## 2024-01-22 DIAGNOSIS — R112 Nausea with vomiting, unspecified: Secondary | ICD-10-CM | POA: Diagnosis not present

## 2024-01-22 DIAGNOSIS — Z8249 Family history of ischemic heart disease and other diseases of the circulatory system: Secondary | ICD-10-CM | POA: Diagnosis not present

## 2024-01-22 DIAGNOSIS — G43109 Migraine with aura, not intractable, without status migrainosus: Secondary | ICD-10-CM | POA: Diagnosis present

## 2024-01-22 DIAGNOSIS — Z888 Allergy status to other drugs, medicaments and biological substances status: Secondary | ICD-10-CM | POA: Diagnosis not present

## 2024-01-22 DIAGNOSIS — I2510 Atherosclerotic heart disease of native coronary artery without angina pectoris: Secondary | ICD-10-CM | POA: Diagnosis not present

## 2024-01-22 DIAGNOSIS — E785 Hyperlipidemia, unspecified: Secondary | ICD-10-CM | POA: Diagnosis present

## 2024-01-22 DIAGNOSIS — R519 Headache, unspecified: Secondary | ICD-10-CM | POA: Diagnosis not present

## 2024-01-22 DIAGNOSIS — I252 Old myocardial infarction: Secondary | ICD-10-CM | POA: Diagnosis not present

## 2024-01-22 DIAGNOSIS — E039 Hypothyroidism, unspecified: Secondary | ICD-10-CM | POA: Diagnosis present

## 2024-01-22 DIAGNOSIS — D649 Anemia, unspecified: Secondary | ICD-10-CM | POA: Diagnosis present

## 2024-01-22 DIAGNOSIS — Z83438 Family history of other disorder of lipoprotein metabolism and other lipidemia: Secondary | ICD-10-CM | POA: Diagnosis not present

## 2024-01-22 DIAGNOSIS — Z820 Family history of epilepsy and other diseases of the nervous system: Secondary | ICD-10-CM | POA: Diagnosis not present

## 2024-01-22 DIAGNOSIS — E0590 Thyrotoxicosis, unspecified without thyrotoxic crisis or storm: Secondary | ICD-10-CM | POA: Diagnosis not present

## 2024-01-22 DIAGNOSIS — Z7982 Long term (current) use of aspirin: Secondary | ICD-10-CM | POA: Diagnosis not present

## 2024-01-22 DIAGNOSIS — Z955 Presence of coronary angioplasty implant and graft: Secondary | ICD-10-CM | POA: Diagnosis not present

## 2024-01-22 DIAGNOSIS — I10 Essential (primary) hypertension: Secondary | ICD-10-CM | POA: Diagnosis not present

## 2024-01-22 DIAGNOSIS — R072 Precordial pain: Secondary | ICD-10-CM | POA: Diagnosis present

## 2024-01-22 DIAGNOSIS — Z803 Family history of malignant neoplasm of breast: Secondary | ICD-10-CM | POA: Diagnosis not present

## 2024-01-22 DIAGNOSIS — Z823 Family history of stroke: Secondary | ICD-10-CM | POA: Diagnosis not present

## 2024-01-22 LAB — URINALYSIS, ROUTINE W REFLEX MICROSCOPIC
Bilirubin Urine: NEGATIVE
Glucose, UA: NEGATIVE mg/dL
Ketones, ur: 20 mg/dL — AB
Nitrite: NEGATIVE
Protein, ur: NEGATIVE mg/dL
Specific Gravity, Urine: 1.017 (ref 1.005–1.030)
pH: 6 (ref 5.0–8.0)

## 2024-01-22 LAB — RENAL FUNCTION PANEL
Albumin: 3.6 g/dL (ref 3.5–5.0)
Anion gap: 12 (ref 5–15)
BUN: 15 mg/dL (ref 6–20)
CO2: 23 mmol/L (ref 22–32)
Calcium: 8.7 mg/dL — ABNORMAL LOW (ref 8.9–10.3)
Chloride: 103 mmol/L (ref 98–111)
Creatinine, Ser: 1.08 mg/dL — ABNORMAL HIGH (ref 0.44–1.00)
GFR, Estimated: 59 mL/min — ABNORMAL LOW (ref >60)
Glucose, Bld: 101 mg/dL — ABNORMAL HIGH (ref 70–99)
Phosphorus: 3.4 mg/dL (ref 2.5–4.6)
Potassium: 3.3 mmol/L — ABNORMAL LOW (ref 3.5–5.1)
Sodium: 138 mmol/L (ref 135–145)

## 2024-01-22 LAB — CBC
HCT: 34.7 % — ABNORMAL LOW (ref 36.0–46.0)
Hemoglobin: 10.9 g/dL — ABNORMAL LOW (ref 12.0–15.0)
MCH: 29.4 pg (ref 26.0–34.0)
MCHC: 31.4 g/dL (ref 30.0–36.0)
MCV: 93.5 fL (ref 80.0–100.0)
Platelets: 170 K/uL (ref 150–400)
RBC: 3.71 MIL/uL — ABNORMAL LOW (ref 3.87–5.11)
RDW: 13.6 % (ref 11.5–15.5)
WBC: 5.7 K/uL (ref 4.0–10.5)
nRBC: 0 % (ref 0.0–0.2)

## 2024-01-22 LAB — LIPID PANEL
Cholesterol: 189 mg/dL (ref 0–200)
HDL: 87 mg/dL (ref >40)
LDL Cholesterol: 98 mg/dL (ref 0–99)
Total CHOL/HDL Ratio: 2.2 ratio
Triglycerides: 18 mg/dL (ref <150)
VLDL: 4 mg/dL (ref 0–40)

## 2024-01-22 LAB — ECHOCARDIOGRAM COMPLETE
AR max vel: 2.32 cm2
AV Area VTI: 2.18 cm2
AV Area mean vel: 2.18 cm2
AV Mean grad: 5.4 mmHg
AV Peak grad: 10.3 mmHg
Ao pk vel: 1.6 m/s
Area-P 1/2: 3.5 cm2
Calc EF: 57.9 %
Height: 67 in
S' Lateral: 2.5 cm
Single Plane A2C EF: 53.8 %
Single Plane A4C EF: 60.6 %
Weight: 2670.21 [oz_av]

## 2024-01-22 LAB — CREATININE, SERUM
Creatinine, Ser: 0.86 mg/dL (ref 0.44–1.00)
GFR, Estimated: >60 mL/min (ref >60)

## 2024-01-22 LAB — HIV ANTIBODY (ROUTINE TESTING W REFLEX): HIV Screen 4th Generation wRfx: NONREACTIVE

## 2024-01-22 LAB — MAGNESIUM: Magnesium: 1.9 mg/dL (ref 1.7–2.4)

## 2024-01-22 MED ORDER — SUMATRIPTAN 20 MG/ACT NA SOLN
20.0000 mg | NASAL | Status: DC | PRN
  Administered 2024-01-22: 20 mg via NASAL
  Filled 2024-01-22: qty 1

## 2024-01-22 MED ORDER — MAGNESIUM SULFATE 2 GM/50ML IV SOLN
2.0000 g | Freq: Once | INTRAVENOUS | Status: AC
  Administered 2024-01-22: 2 g via INTRAVENOUS
  Filled 2024-01-22: qty 50

## 2024-01-22 MED ORDER — TRIMETHOBENZAMIDE HCL 100 MG/ML IM SOLN
200.0000 mg | Freq: Once | INTRAMUSCULAR | Status: AC
  Administered 2024-01-22: 200 mg via INTRAMUSCULAR
  Filled 2024-01-22: qty 2

## 2024-01-22 MED ORDER — HYDRALAZINE HCL 20 MG/ML IJ SOLN
10.0000 mg | Freq: Three times a day (TID) | INTRAMUSCULAR | Status: DC | PRN
  Administered 2024-01-22: 10 mg via INTRAVENOUS
  Filled 2024-01-22: qty 1

## 2024-01-22 MED ORDER — SCOPOLAMINE 1 MG/3DAYS TD PT72
1.0000 | MEDICATED_PATCH | TRANSDERMAL | Status: DC
  Administered 2024-01-22: 1.5 mg via TRANSDERMAL
  Filled 2024-01-22: qty 1

## 2024-01-22 NOTE — Progress Notes (Signed)
 PROGRESS NOTE    Carla West  FMW:[id number] DOB: 24-Nov-1960 DOA: 01/21/2024 PCP: Katrinka Garnette KIDD, MD  Outpatient Specialists:     Brief Narrative:  Patient is a 63 year old female with past medical history significant for NSTEMI in 2020, Graves' disease, anxiety, hypertension, and history of chronic migraine headaches.  Patient was admitted with persistent nausea and vomiting.  Patient is also having headache.  Patient initially reported chest pain, however, troponin was negative.  Cardiology team assessed patient turning for cardiac chest pain was noncardiac.  Nausea and vomiting persists.  Will manage migraine headache actively.  Nausea vomiting may be related to the migraines.   Assessment & Plan:   Principal Problem:   Intractable nausea and vomiting Active Problems:   Essential hypertension   Migraine headache   Hyperlipidemia   Hyperthyroidism   CAD (coronary artery disease)   Intractable nausea with vomiting:  -Etiology uncertain. - May be related to the migraine headaches.  Chest pain: - Negative troponin. - Cardiology input is appreciated.  Coronary artery disease:  - Stable. - Chest pain is deemed noncardiac. - Negative troponin.     Hyperlipidemia:  -Continue statin once she is able to tolerate orally.  Essential hypertension:  - Continue to monitor closely. - Continue to optimize.    Migraine headaches:  - Trial of nasal Imitrex .    DVT prophylaxis: Subcutaneous Lovenox  Code Status: Full code Family Communication: Patient's son. Disposition Plan: Inpatient   Consultants:  Cardiology  Procedures:  None  Antimicrobials:  None   Subjective: Nausea vomiting. Chest pain has resolved significantly.  Objective: Vitals:   01/22/24 0552 01/22/24 0900 01/22/24 0901 01/22/24 1112  BP:  (!) 166/103 (!) 166/103 (!) 158/100  Pulse:  68 69   Resp:  18 18   Temp: 98.5 F (36.9 C)  98.1 F (36.7 C)   TempSrc: Oral  Oral   SpO2:  100% 100%    Weight:      Height:        Intake/Output Summary (Last 24 hours) at 01/22/2024 1138 Last data filed at 01/22/2024 1116 Gross per 24 hour  Intake 2274.53 ml  Output --  Net 2274.53 ml   Filed Weights   01/21/24 2357  Weight: 75.7 kg    Examination:  General exam: Appears calm and comfortable  Respiratory system: Clear to auscultation. Cardiovascular system: S1 & S2 heard. Gastrointestinal system: Abdomen is soft and nontender. Central nervous system: Awake and alert. Extremities: No leg edema.  Data Reviewed: I have personally reviewed following labs and imaging studies  CBC: Recent Labs  Lab 01/21/24 1707 01/21/24 2342  WBC 7.5 5.7  NEUTROABS 6.1  --   HGB 11.8* 10.9*  HCT 36.5 34.7*  MCV 89.5 93.5  PLT 234 170   Basic Metabolic Panel: Recent Labs  Lab 01/21/24 1707 01/21/24 1939 01/21/24 2342  NA 139  --   --   K 4.0  --   --   CL 105  --   --   CO2 19*  --   --   GLUCOSE 126*  --   --   BUN 19  --   --   CREATININE 1.05*  --  0.86  CALCIUM  9.0  --   --   MG  --  1.7  --    GFR: Estimated Creatinine Clearance: 73.8 mL/min (by C-G formula based on SCr of 0.86 mg/dL). Liver Function Tests: Recent Labs  Lab 01/21/24 1707  AST 29  ALT 24  ALKPHOS 63  BILITOT 0.9  PROT 6.8  ALBUMIN 3.9   Recent Labs  Lab 01/21/24 1707  LIPASE 26   No results for input(s): AMMONIA in the last 168 hours. Coagulation Profile: No results for input(s): INR, PROTIME in the last 168 hours. Cardiac Enzymes: No results for input(s): CKTOTAL, CKMB, CKMBINDEX, TROPONINI in the last 168 hours. BNP (last 3 results) No results for input(s): PROBNP in the last 8760 hours. HbA1C: No results for input(s): HGBA1C in the last 72 hours. CBG: No results for input(s): GLUCAP in the last 168 hours. Lipid Profile: Recent Labs    01/22/24 0341  CHOL 189  HDL 87  LDLCALC 98  TRIG 18  CHOLHDL 2.2   Thyroid  Function Tests: No results for input(s):  TSH, T4TOTAL, FREET4, T3FREE, THYROIDAB in the last 72 hours. Anemia Panel: No results for input(s): VITAMINB12, FOLATE, FERRITIN, TIBC, IRON, RETICCTPCT in the last 72 hours. Urine analysis:    Component Value Date/Time   COLORURINE YELLOW 11/16/2023 0851   APPEARANCEUR CLEAR 11/16/2023 0851   LABSPEC 1.015 11/16/2023 0851   PHURINE 7.5 11/16/2023 0851   GLUCOSEU NEGATIVE 11/16/2023 0851   HGBUR NEGATIVE 11/16/2023 0851   BILIRUBINUR NEGATIVE 11/16/2023 0851   KETONESUR NEGATIVE 11/16/2023 0851   UROBILINOGEN 0.2 11/16/2023 0851   NITRITE NEGATIVE 11/16/2023 0851   LEUKOCYTESUR NEGATIVE 11/16/2023 0851   Sepsis Labs: @LABRCNTIP (procalcitonin:4,lacticidven:4)  )No results found for this or any previous visit (from the past 240 hours).       Radiology Studies: CT Head Wo Contrast Result Date: 01/22/2024 CLINICAL DATA:  Headache, sudden, severe EXAM: CT HEAD WITHOUT CONTRAST TECHNIQUE: Contiguous axial images were obtained from the base of the skull through the vertex without intravenous contrast. RADIATION DOSE REDUCTION: This exam was performed according to the departmental dose-optimization program which includes automated exposure control, adjustment of the mA and/or kV according to patient size and/or use of iterative reconstruction technique. COMPARISON:  None Available. FINDINGS: Brain: Normal anatomic configuration. No abnormal intra or extra-axial mass lesion or fluid collection. No abnormal mass effect or midline shift. No evidence of acute intracranial hemorrhage or infarct. Ventricular size is normal. Cerebellum unremarkable. Vascular: Unremarkable Skull: Intact Sinuses/Orbits: Paranasal sinuses are clear. Orbits are unremarkable. Other: Mastoid air cells and middle ear cavities are clear. IMPRESSION: 1. No acute intracranial abnormality. Electronically Signed   By: Dorethia Molt M.D.   On: 01/22/2024 02:29   CT Angio Chest/Abd/Pel for Dissection W  and/or Wo Contrast Result Date: 01/21/2024 CLINICAL DATA:  Acute aortic syndrome suspected. Chest pain and back pain. EXAM: CT ANGIOGRAPHY CHEST, ABDOMEN AND PELVIS TECHNIQUE: Non-contrast CT of the chest was initially obtained. Multidetector CT imaging through the chest, abdomen and pelvis was performed using the standard protocol during bolus administration of intravenous contrast. Multiplanar reconstructed images and MIPs were obtained and reviewed to evaluate the vascular anatomy. RADIATION DOSE REDUCTION: This exam was performed according to the departmental dose-optimization program which includes automated exposure control, adjustment of the mA and/or kV according to patient size and/or use of iterative reconstruction technique. CONTRAST:  OMNIPAQUE  IOHEXOL  350 MG/ML SOLN COMPARISON:  None Available. FINDINGS: CTA CHEST FINDINGS Cardiovascular: Preferential opacification of the thoracic aorta. No evidence of thoracic aortic aneurysm or dissection. Heart is mildly enlarged. No pericardial effusion. Mediastinum/Nodes: No enlarged mediastinal, hilar, or axillary lymph nodes. Thyroid  gland, trachea, and esophagus demonstrate no significant findings. Lungs/Pleura: There is minimal atelectasis in the right lower lobe. The lungs are otherwise clear. There is no pleural  effusion or pneumothorax. Musculoskeletal: No chest wall abnormality. No acute or significant osseous findings. Review of the MIP images confirms the above findings. CTA ABDOMEN AND PELVIS FINDINGS VASCULAR Aorta: Normal caliber aorta without aneurysm, dissection, vasculitis or significant stenosis. Celiac: Patent without evidence of aneurysm, dissection, vasculitis or significant stenosis. SMA: Patent without evidence of aneurysm, dissection, vasculitis or significant stenosis. Renals: Both renal arteries are patent without evidence of aneurysm, dissection, vasculitis, fibromuscular dysplasia or significant stenosis. IMA: Patent without  evidence of aneurysm, dissection, vasculitis or significant stenosis. Inflow: Patent without evidence of aneurysm, dissection, vasculitis or significant stenosis. Veins: No obvious venous abnormality within the limitations of this arterial phase study. Review of the MIP images confirms the above findings. NON-VASCULAR Hepatobiliary: No focal liver abnormality is seen. No gallstones, gallbladder wall thickening, or biliary dilatation. Pancreas: Unremarkable. No pancreatic ductal dilatation or surrounding inflammatory changes. Spleen: Normal in size without focal abnormality. Adrenals/Urinary Tract: Adrenal glands are unremarkable. Kidneys are normal, without renal calculi, focal lesion, or hydronephrosis. Bladder is unremarkable. Stomach/Bowel: Stomach is within normal limits. Appendix appears normal. No evidence of bowel wall thickening, distention, or inflammatory changes. There is diffuse colonic diverticulosis. Lymphatic: No enlarged lymph nodes are seen. Reproductive: Uterus and bilateral adnexa are unremarkable. Other: No abdominal wall hernia or abnormality. No abdominopelvic ascites. Musculoskeletal: No acute or significant osseous findings. Review of the MIP images confirms the above findings. IMPRESSION: 1. No evidence for aortic dissection or aneurysm. 2. Mild cardiomegaly. 3. Colonic diverticulosis. Electronically Signed   By: Greig Pique M.D.   On: 01/21/2024 19:38   DG Chest 2 View Result Date: 01/21/2024 CLINICAL DATA:  Chest pain and shortness of breath EXAM: CHEST - 2 VIEW COMPARISON:  Chest x-ray 01/02/2022 FINDINGS: Patient is slightly rotated. The heart size and mediastinal contours are within normal limits. Both lungs are clear. The visualized skeletal structures are unremarkable. IMPRESSION: No active cardiopulmonary disease. Electronically Signed   By: Greig Pique M.D.   On: 01/21/2024 18:10        Scheduled Meds:  amLODipine   5 mg Oral Daily   aspirin   324 mg Oral Once    atorvastatin   80 mg Oral q1800   enoxaparin  (LOVENOX ) injection  40 mg Subcutaneous Daily   losartan   50 mg Oral Daily   meclizine   25 mg Oral Once   methimazole   10 mg Oral Daily   pantoprazole  (PROTONIX ) IV  40 mg Intravenous Q24H   triamterene -hydrochlorothiazide   1 tablet Oral Daily   Continuous Infusions:  dextrose  5% lactated ringers  Stopped (01/22/24 0516)   magnesium  sulfate bolus IVPB       LOS: 0 days    Time spent: 35 minutes    Leatrice Chapel, MD  [facility] Pager #: [phone] 7PM-7AM contact night coverage as above

## 2024-01-22 NOTE — Progress Notes (Signed)
 Progress Note  Patient Name: Carla West Date of Encounter: 01/22/2024  Primary Cardiologist:   Darryle ONEIDA Decent, MD   Subjective   No chest pain.  Still with nausea and headache.  Unable to take POs.    Inpatient Medications    Scheduled Meds:  amLODipine   5 mg Oral Daily   aspirin   324 mg Oral Once   atorvastatin   80 mg Oral q1800   enoxaparin  (LOVENOX ) injection  40 mg Subcutaneous Daily   losartan   50 mg Oral Daily   meclizine   25 mg Oral Once   methimazole   10 mg Oral Daily   pantoprazole  (PROTONIX ) IV  40 mg Intravenous Q24H   triamterene -hydrochlorothiazide   1 tablet Oral Daily   Continuous Infusions:  dextrose  5% lactated ringers  100 mL/hr at 01/22/24 0228   PRN Meds: acetaminophen , hydrOXYzine , ondansetron  (ZOFRAN ) IV, prochlorperazine , SUMAtriptan    Vital Signs    Vitals:   01/22/24 0400 01/22/24 0415 01/22/24 0445 01/22/24 0552  BP: 132/85 115/79 126/79   Pulse: 78 64 65   Resp:   20   Temp:    98.5 F (36.9 C)  TempSrc:    Oral  SpO2: 98% 99% 99%   Weight:      Height:        Intake/Output Summary (Last 24 hours) at 01/22/2024 0839 Last data filed at 01/22/2024 0217 Gross per 24 hour  Intake 2000 ml  Output --  Net 2000 ml   Filed Weights   01/21/24 2357  Weight: 75.7 kg    Telemetry    NSR - Personally Reviewed  ECG    NSR, rate 76, axis WNL, no acute ST T wave changes.  T wave inversion unchanged from previous.  - Personally Reviewed  Physical Exam   GEN: No acute distress.   Neck: No  JVD Cardiac: RRR, no murmurs, rubs, or gallops.  Respiratory: Clear  to auscultation bilaterally. GI: Soft, nontender, non-distended  MS: No  edema; No deformity. Neuro:  Nonfocal  Psych: Normal affect   Labs    Chemistry Recent Labs  Lab 01/21/24 1707 01/21/24 2342  NA 139  --   K 4.0  --   CL 105  --   CO2 19*  --   GLUCOSE 126*  --   BUN 19  --   CREATININE 1.05* 0.86  CALCIUM  9.0  --   PROT 6.8  --   ALBUMIN 3.9  --    AST 29  --   ALT 24  --   ALKPHOS 63  --   BILITOT 0.9  --   GFRNONAA >60 >60  ANIONGAP 15  --      Hematology Recent Labs  Lab 01/21/24 1707 01/21/24 2342  WBC 7.5 5.7  RBC 4.08 3.71*  HGB 11.8* 10.9*  HCT 36.5 34.7*  MCV 89.5 93.5  MCH 28.9 29.4  MCHC 32.3 31.4  RDW 13.6 13.6  PLT 234 170    Cardiac EnzymesNo results for input(s): TROPONINI in the last 168 hours. No results for input(s): TROPIPOC in the last 168 hours.   BNPNo results for input(s): BNP, PROBNP in the last 168 hours.   DDimer No results for input(s): DDIMER in the last 168 hours.   Radiology    CT Head Wo Contrast Result Date: 01/22/2024 CLINICAL DATA:  Headache, sudden, severe EXAM: CT HEAD WITHOUT CONTRAST TECHNIQUE: Contiguous axial images were obtained from the base of the skull through the vertex without intravenous contrast. RADIATION DOSE  REDUCTION: This exam was performed according to the departmental dose-optimization program which includes automated exposure control, adjustment of the mA and/or kV according to patient size and/or use of iterative reconstruction technique. COMPARISON:  None Available. FINDINGS: Brain: Normal anatomic configuration. No abnormal intra or extra-axial mass lesion or fluid collection. No abnormal mass effect or midline shift. No evidence of acute intracranial hemorrhage or infarct. Ventricular size is normal. Cerebellum unremarkable. Vascular: Unremarkable Skull: Intact Sinuses/Orbits: Paranasal sinuses are clear. Orbits are unremarkable. Other: Mastoid air cells and middle ear cavities are clear. IMPRESSION: 1. No acute intracranial abnormality. Electronically Signed   By: Dorethia Molt M.D.   On: 01/22/2024 02:29   CT Angio Chest/Abd/Pel for Dissection W and/or Wo Contrast Result Date: 01/21/2024 CLINICAL DATA:  Acute aortic syndrome suspected. Chest pain and back pain. EXAM: CT ANGIOGRAPHY CHEST, ABDOMEN AND PELVIS TECHNIQUE: Non-contrast CT of the chest was  initially obtained. Multidetector CT imaging through the chest, abdomen and pelvis was performed using the standard protocol during bolus administration of intravenous contrast. Multiplanar reconstructed images and MIPs were obtained and reviewed to evaluate the vascular anatomy. RADIATION DOSE REDUCTION: This exam was performed according to the departmental dose-optimization program which includes automated exposure control, adjustment of the mA and/or kV according to patient size and/or use of iterative reconstruction technique. CONTRAST:  OMNIPAQUE  IOHEXOL  350 MG/ML SOLN COMPARISON:  None Available. FINDINGS: CTA CHEST FINDINGS Cardiovascular: Preferential opacification of the thoracic aorta. No evidence of thoracic aortic aneurysm or dissection. Heart is mildly enlarged. No pericardial effusion. Mediastinum/Nodes: No enlarged mediastinal, hilar, or axillary lymph nodes. Thyroid  gland, trachea, and esophagus demonstrate no significant findings. Lungs/Pleura: There is minimal atelectasis in the right lower lobe. The lungs are otherwise clear. There is no pleural effusion or pneumothorax. Musculoskeletal: No chest wall abnormality. No acute or significant osseous findings. Review of the MIP images confirms the above findings. CTA ABDOMEN AND PELVIS FINDINGS VASCULAR Aorta: Normal caliber aorta without aneurysm, dissection, vasculitis or significant stenosis. Celiac: Patent without evidence of aneurysm, dissection, vasculitis or significant stenosis. SMA: Patent without evidence of aneurysm, dissection, vasculitis or significant stenosis. Renals: Both renal arteries are patent without evidence of aneurysm, dissection, vasculitis, fibromuscular dysplasia or significant stenosis. IMA: Patent without evidence of aneurysm, dissection, vasculitis or significant stenosis. Inflow: Patent without evidence of aneurysm, dissection, vasculitis or significant stenosis. Veins: No obvious venous abnormality within the  limitations of this arterial phase study. Review of the MIP images confirms the above findings. NON-VASCULAR Hepatobiliary: No focal liver abnormality is seen. No gallstones, gallbladder wall thickening, or biliary dilatation. Pancreas: Unremarkable. No pancreatic ductal dilatation or surrounding inflammatory changes. Spleen: Normal in size without focal abnormality. Adrenals/Urinary Tract: Adrenal glands are unremarkable. Kidneys are normal, without renal calculi, focal lesion, or hydronephrosis. Bladder is unremarkable. Stomach/Bowel: Stomach is within normal limits. Appendix appears normal. No evidence of bowel wall thickening, distention, or inflammatory changes. There is diffuse colonic diverticulosis. Lymphatic: No enlarged lymph nodes are seen. Reproductive: Uterus and bilateral adnexa are unremarkable. Other: No abdominal wall hernia or abnormality. No abdominopelvic ascites. Musculoskeletal: No acute or significant osseous findings. Review of the MIP images confirms the above findings. IMPRESSION: 1. No evidence for aortic dissection or aneurysm. 2. Mild cardiomegaly. 3. Colonic diverticulosis. Electronically Signed   By: Greig Pique M.D.   On: 01/21/2024 19:38   DG Chest 2 View Result Date: 01/21/2024 CLINICAL DATA:  Chest pain and shortness of breath EXAM: CHEST - 2 VIEW COMPARISON:  Chest x-ray 01/02/2022 FINDINGS: Patient  is slightly rotated. The heart size and mediastinal contours are within normal limits. Both lungs are clear. The visualized skeletal structures are unremarkable. IMPRESSION: No active cardiopulmonary disease. Electronically Signed   By: Greig Pique M.D.   On: 01/21/2024 18:10    Cardiac Studies   Echo:   Prelim mild TR and NL LV function.   Final pending  Patient Profile     63 y.o. female with a hx of NSTEMI s/p PCI to LCX (2020), HTN, HLD, hyperthyroidism and migraines who is being seen 01/21/2024 for the evaluation of chest pain at the request of Dr. Randol.    Assessment & Plan    Chest pain:  Non anginal greater than anginal features.  No objective evidence of ischemia.    Echo final pending.  No further cardiac work up indicated.    HTN:  Unable to take POs.  Will give IV hydralazine  until taking POs.  Resume previous meds when able PO.    For questions or updates, please contact [facility] Please consult [email or web address] for contact info under Cardiology/STEMI.   Signed, Lynwood Schilling, MD  01/22/2024, 8:39 AM

## 2024-01-22 NOTE — ED Notes (Signed)
 ECHO at bedside.

## 2024-01-23 DIAGNOSIS — I2510 Atherosclerotic heart disease of native coronary artery without angina pectoris: Secondary | ICD-10-CM

## 2024-01-23 DIAGNOSIS — E0590 Thyrotoxicosis, unspecified without thyrotoxic crisis or storm: Secondary | ICD-10-CM | POA: Diagnosis not present

## 2024-01-23 DIAGNOSIS — I10 Essential (primary) hypertension: Secondary | ICD-10-CM | POA: Diagnosis not present

## 2024-01-23 DIAGNOSIS — R112 Nausea with vomiting, unspecified: Secondary | ICD-10-CM | POA: Diagnosis not present

## 2024-01-23 DIAGNOSIS — I361 Nonrheumatic tricuspid (valve) insufficiency: Secondary | ICD-10-CM

## 2024-01-23 DIAGNOSIS — G43809 Other migraine, not intractable, without status migrainosus: Secondary | ICD-10-CM | POA: Diagnosis not present

## 2024-01-23 DIAGNOSIS — R079 Chest pain, unspecified: Secondary | ICD-10-CM | POA: Diagnosis not present

## 2024-01-23 MED ORDER — PANTOPRAZOLE SODIUM 40 MG PO TBEC: 40.0000 mg | DELAYED_RELEASE_TABLET | Freq: Every day | ORAL | Status: DC

## 2024-01-23 MED ORDER — POTASSIUM CHLORIDE CRYS ER 20 MEQ PO TBCR
40.0000 meq | EXTENDED_RELEASE_TABLET | Freq: Once | ORAL | Status: AC
  Administered 2024-01-23 (×2): 40 meq via ORAL
  Filled 2024-01-23: qty 2

## 2024-01-23 MED ORDER — ROSUVASTATIN CALCIUM 20 MG PO TABS: 40.0000 mg | ORAL_TABLET | Freq: Every day | ORAL | 2 refills | Status: DC

## 2024-01-23 NOTE — Plan of Care (Signed)

## 2024-01-23 NOTE — Progress Notes (Signed)
 Progress Note  Patient Name: Carla West Date of Encounter: 01/23/2024  Primary Cardiologist:   [facility]   Subjective   Headache is better.  She is eating.  No chest pain  Inpatient Medications    Scheduled Meds:  amLODipine   5 mg Oral Daily   aspirin   324 mg Oral Once   atorvastatin   80 mg Oral q1800   enoxaparin  (LOVENOX ) injection  40 mg Subcutaneous Daily   losartan   50 mg Oral Daily   meclizine   25 mg Oral Once   methimazole   10 mg Oral Daily   pantoprazole  (PROTONIX ) IV  40 mg Intravenous Q24H   scopolamine   1 patch Transdermal Q72H   triamterene -hydrochlorothiazide   1 tablet Oral Daily   Continuous Infusions:   PRN Meds: acetaminophen , hydrALAZINE , hydrOXYzine , ondansetron  (ZOFRAN ) IV, prochlorperazine , SUMAtriptan    Vital Signs    Vitals:   01/22/24 1714 01/22/24 2112 01/22/24 2321 01/23/24 0530  BP: (!) 157/98 (!) 153/96 (!) 165/90 (!) 160/96  Pulse: 73 85 70 75  Resp: 18  18 16   Temp: 98.5 F (36.9 C) 98.4 F (36.9 C) 97.8 F (36.6 C) 99 F (37.2 C)  TempSrc: Oral Oral  Oral  SpO2: 95% 100% 94% 96%  Weight:      Height:        Intake/Output Summary (Last 24 hours) at 01/23/2024 0738 Last data filed at 01/22/2024 1116 Gross per 24 hour  Intake 274.53 ml  Output --  Net 274.53 ml   Filed Weights   01/21/24 2357  Weight: 75.7 kg    Telemetry    NSR - Personally Reviewed  ECG    NA  Physical Exam   GEN: No distress    Labs    Chemistry Recent Labs  Lab 01/21/24 1707 01/21/24 2342 01/22/24 1050  NA 139  --  138  K 4.0  --  3.3*  CL 105  --  103  CO2 19*  --  23  GLUCOSE 126*  --  101*  BUN 19  --  15  CREATININE 1.05* 0.86 1.08*  CALCIUM  9.0  --  8.7*  PROT 6.8  --   --   ALBUMIN 3.9  --  3.6  AST 29  --   --   ALT 24  --   --   ALKPHOS 63  --   --   BILITOT 0.9  --   --   GFRNONAA >60 >60 59*  ANIONGAP 15  --  12     Hematology Recent Labs  Lab 01/21/24 1707 01/21/24 2342  WBC 7.5 5.7  RBC  4.08 3.71*  HGB 11.8* 10.9*  HCT 36.5 34.7*  MCV 89.5 93.5  MCH 28.9 29.4  MCHC 32.3 31.4  RDW 13.6 13.6  PLT 234 170    Cardiac EnzymesNo results for input(s): TROPONINI in the last 168 hours. No results for input(s): TROPIPOC in the last 168 hours.   BNPNo results for input(s): BNP, PROBNP in the last 168 hours.   DDimer No results for input(s): DDIMER in the last 168 hours.   Radiology    ECHOCARDIOGRAM COMPLETE Result Date: 01/22/2024    ECHOCARDIOGRAM REPORT   Patient Name:   Carla West Date of Exam: 01/22/2024 Medical Rec #:  [id number]       Height:       67.0 in Accession #:    [id number]      Weight:       166.9 lb Date of  Birth:  07/31/1960       BSA:          1.873 m Patient Age:    62 years        BP:           126/79 mmHg Patient Gender: F               HR:           57 bpm. Exam Location:  Inpatient Procedure: 2D Echo, Cardiac Doppler and Color Doppler (Both Spectral and Color            Flow Doppler were utilized during procedure). Indications:    Chest PAin  History:        Patient has prior history of Echocardiogram examinations.                 Signs/Symptoms:Chest Pain; Risk Factors:Hypertension.  Sonographer:    Vella Key Referring Phys: [place].Coulter MOHAMMAD L GARBA IMPRESSIONS  1. Left ventricular ejection fraction, by estimation, is 60 to 65%. The left ventricle has normal function. The left ventricle has no regional wall motion abnormalities. There is moderate left ventricular hypertrophy. Left ventricular diastolic parameters were normal.  2. Right ventricular systolic function is normal. The right ventricular size is normal. There is normal pulmonary artery systolic pressure.  3. The mitral valve is normal in structure. No evidence of mitral valve regurgitation. No evidence of mitral stenosis.  4. The aortic valve is tricuspid. Aortic valve regurgitation is not visualized. No aortic stenosis is present.  5. The inferior vena cava is normal in size with greater than  50% respiratory variability, suggesting right atrial pressure of 3 mmHg. FINDINGS  Left Ventricle: Left ventricular ejection fraction, by estimation, is 60 to 65%. The left ventricle has normal function. The left ventricle has no regional wall motion abnormalities. The left ventricular internal cavity size was normal in size. There is  moderate left ventricular hypertrophy. Left ventricular diastolic parameters were normal. Right Ventricle: The right ventricular size is normal. Right vetricular wall thickness was not well visualized. Right ventricular systolic function is normal. There is normal pulmonary artery systolic pressure. The tricuspid regurgitant velocity is 2.43 m/s, and with an assumed right atrial pressure of 3 mmHg, the estimated right ventricular systolic pressure is 26.6 mmHg. Left [facility]: Left atrial size was normal in size. Right [facility]: Right atrial size was normal in size. Pericardium: There is no evidence of pericardial effusion. Mitral Valve: The mitral valve is normal in structure. No evidence of mitral valve regurgitation. No evidence of mitral valve stenosis. Tricuspid Valve: The tricuspid valve is normal in structure. Tricuspid valve regurgitation is mild . No evidence of tricuspid stenosis. Aortic Valve: The aortic valve is tricuspid. Aortic valve regurgitation is not visualized. No aortic stenosis is present. Aortic valve mean gradient measures 5.4 mmHg. Aortic valve peak gradient measures 10.3 mmHg. Aortic valve area, by VTI measures 2.18  cm. Pulmonic Valve: The pulmonic valve was not well visualized. Pulmonic valve regurgitation is mild. No evidence of pulmonic stenosis. Aorta: The aortic root and ascending aorta are structurally normal, with no evidence of dilitation. Venous: The inferior vena cava is normal in size with greater than 50% respiratory variability, suggesting right atrial pressure of 3 mmHg. IAS/Shunts: No atrial level shunt detected by color flow Doppler.  LEFT  VENTRICLE PLAX 2D LVIDd:         3.90 cm     Diastology LVIDs:  2.50 cm     LV e' medial:    11.10 cm/s LV PW:         1.20 cm     LV E/e' medial:  9.0 LV IVS:        1.30 cm     LV e' lateral:   16.50 cm/s LVOT diam:     1.80 cm     LV E/e' lateral: 6.0 LV SV:         78 LV SV Index:   41 LVOT Area:     2.54 cm  LV Volumes (MOD) LV vol d, MOD A2C: 88.8 ml LV vol d, MOD A4C: 95.7 ml LV vol s, MOD A2C: 41.0 ml LV vol s, MOD A4C: 37.7 ml LV SV MOD A2C:     47.8 ml LV SV MOD A4C:     95.7 ml LV SV MOD BP:      54.1 ml RIGHT VENTRICLE RV Basal diam:  3.40 cm RV S prime:     15.10 cm/s TAPSE (M-mode): 3.2 cm LEFT ATRIUM             Index        RIGHT ATRIUM           Index LA diam:        3.40 cm 1.82 cm/m   RA Area:     15.40 cm LA Vol (A2C):   68.4 ml 36.53 ml/m  RA Volume:   34.50 ml  18.42 ml/m LA Vol (A4C):   42.1 ml 22.48 ml/m LA Biplane Vol: 58.3 ml 31.13 ml/m  AORTIC VALVE AV Area (Vmax):    2.32 cm AV Area (Vmean):   2.18 cm AV Area (VTI):     2.18 cm AV Vmax:           160.42 cm/s AV Vmean:          109.598 cm/s AV VTI:            0.356 m AV Peak Grad:      10.3 mmHg AV Mean Grad:      5.4 mmHg LVOT Vmax:         146.00 cm/s LVOT Vmean:        94.100 cm/s LVOT VTI:          0.305 m LVOT/AV VTI ratio: 0.86  AORTA Ao Root diam: 3.10 cm Ao Asc diam:  3.40 cm MITRAL VALVE               TRICUSPID VALVE MV Area (PHT): 3.50 cm    TR Peak grad:   23.6 mmHg MV Decel Time: 217 msec    TR Vmax:        243.00 cm/s MV E velocity: 99.60 cm/s MV A velocity: 84.40 cm/s  SHUNTS MV E/A ratio:  1.18        Systemic VTI:  0.30 m                            Systemic Diam: 1.80 cm Dorn Ross MD Electronically signed by Dorn Ross MD Signature Date/Time: 01/22/2024/12:31:47 PM    Final    CT Head Wo Contrast Result Date: 01/22/2024 CLINICAL DATA:  Headache, sudden, severe EXAM: CT HEAD WITHOUT CONTRAST TECHNIQUE: Contiguous axial images were obtained from the base of the skull through the vertex without  intravenous contrast. RADIATION DOSE REDUCTION: This exam was performed according to the departmental dose-optimization program which  includes automated exposure control, adjustment of the mA and/or kV according to patient size and/or use of iterative reconstruction technique. COMPARISON:  None Available. FINDINGS: Brain: Normal anatomic configuration. No abnormal intra or extra-axial mass lesion or fluid collection. No abnormal mass effect or midline shift. No evidence of acute intracranial hemorrhage or infarct. Ventricular size is normal. Cerebellum unremarkable. Vascular: Unremarkable Skull: Intact Sinuses/Orbits: Paranasal sinuses are clear. Orbits are unremarkable. Other: Mastoid air cells and middle ear cavities are clear. IMPRESSION: 1. No acute intracranial abnormality. Electronically Signed   By: Dorethia Molt M.D.   On: 01/22/2024 02:29   CT Angio Chest/Abd/Pel for Dissection W and/or Wo Contrast Result Date: 01/21/2024 CLINICAL DATA:  Acute aortic syndrome suspected. Chest pain and back pain. EXAM: CT ANGIOGRAPHY CHEST, ABDOMEN AND PELVIS TECHNIQUE: Non-contrast CT of the chest was initially obtained. Multidetector CT imaging through the chest, abdomen and pelvis was performed using the standard protocol during bolus administration of intravenous contrast. Multiplanar reconstructed images and MIPs were obtained and reviewed to evaluate the vascular anatomy. RADIATION DOSE REDUCTION: This exam was performed according to the departmental dose-optimization program which includes automated exposure control, adjustment of the mA and/or kV according to patient size and/or use of iterative reconstruction technique. CONTRAST:  OMNIPAQUE  IOHEXOL  350 MG/ML SOLN COMPARISON:  None Available. FINDINGS: CTA CHEST FINDINGS Cardiovascular: Preferential opacification of the thoracic aorta. No evidence of thoracic aortic aneurysm or dissection. Heart is mildly enlarged. No pericardial effusion. Mediastinum/Nodes:  No enlarged mediastinal, hilar, or axillary lymph nodes. Thyroid  gland, trachea, and esophagus demonstrate no significant findings. Lungs/Pleura: There is minimal atelectasis in the right lower lobe. The lungs are otherwise clear. There is no pleural effusion or pneumothorax. Musculoskeletal: No chest wall abnormality. No acute or significant osseous findings. Review of the MIP images confirms the above findings. CTA ABDOMEN AND PELVIS FINDINGS VASCULAR Aorta: Normal caliber aorta without aneurysm, dissection, vasculitis or significant stenosis. Celiac: Patent without evidence of aneurysm, dissection, vasculitis or significant stenosis. SMA: Patent without evidence of aneurysm, dissection, vasculitis or significant stenosis. Renals: Both renal arteries are patent without evidence of aneurysm, dissection, vasculitis, fibromuscular dysplasia or significant stenosis. IMA: Patent without evidence of aneurysm, dissection, vasculitis or significant stenosis. Inflow: Patent without evidence of aneurysm, dissection, vasculitis or significant stenosis. Veins: No obvious venous abnormality within the limitations of this arterial phase study. Review of the MIP images confirms the above findings. NON-VASCULAR Hepatobiliary: No focal liver abnormality is seen. No gallstones, gallbladder wall thickening, or biliary dilatation. Pancreas: Unremarkable. No pancreatic ductal dilatation or surrounding inflammatory changes. Spleen: Normal in size without focal abnormality. Adrenals/Urinary Tract: Adrenal glands are unremarkable. Kidneys are normal, without renal calculi, focal lesion, or hydronephrosis. Bladder is unremarkable. Stomach/Bowel: Stomach is within normal limits. Appendix appears normal. No evidence of bowel wall thickening, distention, or inflammatory changes. There is diffuse colonic diverticulosis. Lymphatic: No enlarged lymph nodes are seen. Reproductive: Uterus and bilateral adnexa are unremarkable. Other: No abdominal  wall hernia or abnormality. No abdominopelvic ascites. Musculoskeletal: No acute or significant osseous findings. Review of the MIP images confirms the above findings. IMPRESSION: 1. No evidence for aortic dissection or aneurysm. 2. Mild cardiomegaly. 3. Colonic diverticulosis. Electronically Signed   By: Greig Pique M.D.   On: 01/21/2024 19:38   DG Chest 2 View Result Date: 01/21/2024 CLINICAL DATA:  Chest pain and shortness of breath EXAM: CHEST - 2 VIEW COMPARISON:  Chest x-ray 01/02/2022 FINDINGS: Patient is slightly rotated. The heart size and mediastinal contours are within normal  limits. Both lungs are clear. The visualized skeletal structures are unremarkable. IMPRESSION: No active cardiopulmonary disease. Electronically Signed   By: Greig Pique M.D.   On: 01/21/2024 18:10    Cardiac Studies   Echo:   See above.   Patient Profile     63 y.o. female with a hx of NSTEMI s/p PCI to LCX (2020), HTN, HLD, hyperthyroidism and migraines who is being seen 01/21/2024 for the evaluation of chest pain at the request of Dr. Randol.   Assessment & Plan    Chest pain:  Non anginal.  No findings on echo.  No further work up  CAD:  She has a cardiologist in [facility].  Follow with them.     HTN:  Now taking POs.  Resume previous meds.  Follow up with BP diary with PCP or primary cards  TR:  Mild.  Reviewed with the patient.  No clinical impact.  No change in therapy. Follow.   DYSLIPIDEMIA:  LDL is not at target.  I would suggest Crestor  40 mg PO daily instead of Lipitor  and follow up in 3 months with [facility] and repeat lipid.  However, she is not followed in our clinic and so might want to discuss this with her cardiologist.   For questions or updates, please contact [facility] Please consult [email or web address] for contact info under Cardiology/STEMI.   Signed, Lynwood Schilling, MD  01/23/2024, 7:38 AM

## 2024-01-23 NOTE — Progress Notes (Signed)
 PHARMACIST - PHYSICIAN COMMUNICATION  DR:   Darci  CONCERNING: IV to Oral Route Change Policy  RECOMMENDATION: This patient is receiving Protonix  by the intravenous route.  Based on criteria approved by the Pharmacy and Therapeutics Committee, the intravenous medication(s) is/are being converted to the equivalent oral dose form(s).   DESCRIPTION: These criteria include: The patient is eating (either orally or via tube) and/or has been taking other orally administered medications for a least 24 hours The patient has no evidence of active gastrointestinal bleeding or impaired GI absorption (gastrectomy, short bowel, patient on TNA or NPO).  If you have questions about this conversion, please contact the Pharmacy Department  []   [phone] )  [name] []   [phone] )  [name] [x]   [phone] )  [name] []   [phone] )  [name] []   [phone] )  [name]   Carla West Alert, [facility] 01/23/2024 9:39 AM

## 2024-01-23 NOTE — Discharge Summary (Signed)
 Physician Discharge Summary   Patient: Carla West MRN: [id number] DOB: [birth year]  Admit date:     01/21/2024  Discharge date: 01/23/24  Discharge Physician: Concepcion Riser   PCP: Katrinka Garnette KIDD, MD   Recommendations at discharge:    PCP follow up in 1 week. Neurology follow up suggested for migraine management. Cardiology follow up in 3 months.  Discharge Diagnoses: Principal Problem:   Intractable nausea and vomiting Active Problems:   Essential hypertension   Migraine headache   Hyperlipidemia   Hyperthyroidism   CAD (coronary artery disease)  Resolved Problems:   * No resolved hospital problems. *  Hospital Course: 63 year old female with past medical history significant for NSTEMI in 2020, Graves' disease, anxiety, hypertension, and history of chronic migraine headaches.  Patient was admitted with persistent nausea and vomiting.  Patient is also having headache.  Patient initially reported chest pain, however, troponin was negative.  Cardiology team assessed patient turning for cardiac chest pain was noncardiac.  Nausea and vomiting persists.  Treated fro symptoms related to the migraines. Cardiology advised no further work up, Lipitor  changed to Crestor  40mg  daily, follow up in 3 months advised.    Assessment and Plan: Intractable nausea with vomiting:  Related to the migraine headaches. Symptoms much better, able to tolerate diet. Advised Zofran  PRN. She wishes to go home. Advised PCP follow up in 1 week, she understands and agrees.   Chest pain: Negative troponin. Symptoms related to migraine Cardiology input is appreciated. No further work up, outpatient follow up in 3 months.  Migraine headaches:  Advised to follow neurology as outpatient for further management.   Hyperlipidemia:  Cardiology advised to change lipitor  to crestor  40mg . New script sent to pharmacy.  Essential hypertension:  BP higher side, resumed home medication regimen.          Consultants: Cardiology Procedures performed: none  Disposition: Home Diet recommendation:  Discharge Diet Orders (From admission, onward)     Start     Ordered   01/23/24 0000  Diet - low sodium heart healthy        01/23/24 1032           Cardiac diet DISCHARGE MEDICATION: Allergies as of 01/23/2024       Reactions   Elavil  [amitriptyline  Hcl] Other (See Comments)   Jittery..the patient states she has not had any side effects         Medication List     STOP taking these medications    atorvastatin  80 MG tablet Commonly known as: LIPITOR        TAKE these medications    almotriptan  12.5 MG tablet Commonly known as: AXERT  Take 12.5 mg by mouth as needed for migraine.   amLODipine  5 MG tablet Commonly known as: NORVASC  Take 1 tablet (5 mg total) by mouth daily.   aspirin  EC 81 MG tablet Take 1 tablet (81 mg total) by mouth daily.   Emgality  120 MG/ML Soaj Generic drug: Galcanezumab -gnlm INJECT 240MG  UNDER THE SKIN ONCE FOR A STARTING DOSE   ezetimibe  10 MG tablet Commonly known as: ZETIA  Take 1 tablet (10 mg total) by mouth daily.   hydrOXYzine  25 MG tablet Commonly known as: ATARAX  1 tab po q hs prn insomnia What changed:  how much to take how to take this when to take this reasons to take this additional instructions   losartan  50 MG tablet Commonly known as: COZAAR  TAKE 1 TABLET(50 MG) BY MOUTH DAILY   methimazole  10 MG tablet Commonly  known as: TAPAZOLE  Take 1 tablet (10 mg total) by mouth daily.   metoprolol  succinate 50 MG 24 hr tablet Commonly known as: TOPROL -XL Take 1 tablet (50 mg total) by mouth daily. Take with or immediately following a meal.   ondansetron  4 MG disintegrating tablet Commonly known as: ZOFRAN -ODT Take 1 tablet (4 mg total) by mouth every 8 (eight) hours as needed.   promethazine  25 MG tablet Commonly known as: PHENERGAN  Take 25 mg by mouth every 8 (eight) hours as needed for vomiting or nausea.    rosuvastatin  20 MG tablet Commonly known as: Crestor  Take 2 tablets (40 mg total) by mouth daily.   traZODone  50 MG tablet Commonly known as: DESYREL  Take 50 mg by mouth at bedtime as needed for sleep. Take one tablet (50mg ) by mouth at bedtime as needed for sleep.   triamterene -hydrochlorothiazide  37.5-25 MG tablet Commonly known as: MAXZIDE -25 Take 1 tablet by mouth daily.   Vitamin D  (Ergocalciferol ) 1.25 MG (50000 UNIT) Caps capsule Commonly known as: DRISDOL  Take 1 capsule (50,000 Units total) by mouth every 7 (seven) days. What changed: additional instructions        Discharge Exam: Filed Weights   01/21/24 2357  Weight: 75.7 kg      01/23/2024    8:33 AM 01/23/2024    5:30 AM 01/22/2024   11:21 PM  Vitals with BMI  Systolic 168 160 834  Diastolic 95 96 90  Pulse 64 75 70   General - Elderly African American female, no apparent distress HEENT - PERRLA, EOMI, atraumatic head, non tender sinuses. Lung - Clear, diffuse rales, rhonchi, wheezes. Heart - S1, S2 heard, no murmurs, rubs, trace pedal edema. Abdomen - Soft, non tender, bowel sounds good Neuro - Alert, awake and oriented x 3, non focal exam. Skin - Warm and dry.  Condition at discharge: stable  The results of significant diagnostics from this hospitalization (including imaging, microbiology, ancillary and laboratory) are listed below for reference.   Imaging Studies: ECHOCARDIOGRAM COMPLETE Result Date: 01/22/2024    ECHOCARDIOGRAM REPORT   Patient Name:   Carla West Date of Exam: 01/22/2024 Medical Rec #:  [id number]       Height:       67.0 in Accession #:    [id number]      Weight:       166.9 lb Date of Birth:  21-Jan-1961       BSA:          1.873 m Patient Age:    62 years        BP:           126/79 mmHg Patient Gender: F               HR:           57 bpm. Exam Location:  Inpatient Procedure: 2D Echo, Cardiac Doppler and Color Doppler (Both Spectral and Color            Flow Doppler were utilized  during procedure). Indications:    Chest PAin  History:        Patient has prior history of Echocardiogram examinations.                 Signs/Symptoms:Chest Pain; Risk Factors:Hypertension.  Sonographer:    Vella Key Referring Phys: [place].Coulter MOHAMMAD L GARBA IMPRESSIONS  1. Left ventricular ejection fraction, by estimation, is 60 to 65%. The left ventricle has normal function. The left ventricle has no regional wall motion  abnormalities. There is moderate left ventricular hypertrophy. Left ventricular diastolic parameters were normal.  2. Right ventricular systolic function is normal. The right ventricular size is normal. There is normal pulmonary artery systolic pressure.  3. The mitral valve is normal in structure. No evidence of mitral valve regurgitation. No evidence of mitral stenosis.  4. The aortic valve is tricuspid. Aortic valve regurgitation is not visualized. No aortic stenosis is present.  5. The inferior vena cava is normal in size with greater than 50% respiratory variability, suggesting right atrial pressure of 3 mmHg. FINDINGS  Left Ventricle: Left ventricular ejection fraction, by estimation, is 60 to 65%. The left ventricle has normal function. The left ventricle has no regional wall motion abnormalities. The left ventricular internal cavity size was normal in size. There is  moderate left ventricular hypertrophy. Left ventricular diastolic parameters were normal. Right Ventricle: The right ventricular size is normal. Right vetricular wall thickness was not well visualized. Right ventricular systolic function is normal. There is normal pulmonary artery systolic pressure. The tricuspid regurgitant velocity is 2.43 m/s, and with an assumed right atrial pressure of 3 mmHg, the estimated right ventricular systolic pressure is 26.6 mmHg. Left Atrium: Left atrial size was normal in size. Right Atrium: Right atrial size was normal in size. Pericardium: There is no evidence of pericardial effusion. Mitral  Valve: The mitral valve is normal in structure. No evidence of mitral valve regurgitation. No evidence of mitral valve stenosis. Tricuspid Valve: The tricuspid valve is normal in structure. Tricuspid valve regurgitation is mild . No evidence of tricuspid stenosis. Aortic Valve: The aortic valve is tricuspid. Aortic valve regurgitation is not visualized. No aortic stenosis is present. Aortic valve mean gradient measures 5.4 mmHg. Aortic valve peak gradient measures 10.3 mmHg. Aortic valve area, by VTI measures 2.18  cm. Pulmonic Valve: The pulmonic valve was not well visualized. Pulmonic valve regurgitation is mild. No evidence of pulmonic stenosis. Aorta: The aortic root and ascending aorta are structurally normal, with no evidence of dilitation. Venous: The inferior vena cava is normal in size with greater than 50% respiratory variability, suggesting right atrial pressure of 3 mmHg. IAS/Shunts: No atrial level shunt detected by color flow Doppler.  LEFT VENTRICLE PLAX 2D LVIDd:         3.90 cm     Diastology LVIDs:         2.50 cm     LV e' medial:    11.10 cm/s LV PW:         1.20 cm     LV E/e' medial:  9.0 LV IVS:        1.30 cm     LV e' lateral:   16.50 cm/s LVOT diam:     1.80 cm     LV E/e' lateral: 6.0 LV SV:         78 LV SV Index:   41 LVOT Area:     2.54 cm  LV Volumes (MOD) LV vol d, MOD A2C: 88.8 ml LV vol d, MOD A4C: 95.7 ml LV vol s, MOD A2C: 41.0 ml LV vol s, MOD A4C: 37.7 ml LV SV MOD A2C:     47.8 ml LV SV MOD A4C:     95.7 ml LV SV MOD BP:      54.1 ml RIGHT VENTRICLE RV Basal diam:  3.40 cm RV S prime:     15.10 cm/s TAPSE (M-mode): 3.2 cm LEFT ATRIUM  Index        RIGHT ATRIUM           Index LA diam:        3.40 cm 1.82 cm/m   RA Area:     15.40 cm LA Vol (A2C):   68.4 ml 36.53 ml/m  RA Volume:   34.50 ml  18.42 ml/m LA Vol (A4C):   42.1 ml 22.48 ml/m LA Biplane Vol: 58.3 ml 31.13 ml/m  AORTIC VALVE AV Area (Vmax):    2.32 cm AV Area (Vmean):   2.18 cm AV Area (VTI):      2.18 cm AV Vmax:           160.42 cm/s AV Vmean:          109.598 cm/s AV VTI:            0.356 m AV Peak Grad:      10.3 mmHg AV Mean Grad:      5.4 mmHg LVOT Vmax:         146.00 cm/s LVOT Vmean:        94.100 cm/s LVOT VTI:          0.305 m LVOT/AV VTI ratio: 0.86  AORTA Ao Root diam: 3.10 cm Ao Asc diam:  3.40 cm MITRAL VALVE               TRICUSPID VALVE MV Area (PHT): 3.50 cm    TR Peak grad:   23.6 mmHg MV Decel Time: 217 msec    TR Vmax:        243.00 cm/s MV E velocity: 99.60 cm/s MV A velocity: 84.40 cm/s  SHUNTS MV E/A ratio:  1.18        Systemic VTI:  0.30 m                            Systemic Diam: 1.80 cm Dorn Ross MD Electronically signed by Dorn Ross MD Signature Date/Time: 01/22/2024/12:31:47 PM    Final    CT Head Wo Contrast Result Date: 01/22/2024 CLINICAL DATA:  Headache, sudden, severe EXAM: CT HEAD WITHOUT CONTRAST TECHNIQUE: Contiguous axial images were obtained from the base of the skull through the vertex without intravenous contrast. RADIATION DOSE REDUCTION: This exam was performed according to the departmental dose-optimization program which includes automated exposure control, adjustment of the mA and/or kV according to patient size and/or use of iterative reconstruction technique. COMPARISON:  None Available. FINDINGS: Brain: Normal anatomic configuration. No abnormal intra or extra-axial mass lesion or fluid collection. No abnormal mass effect or midline shift. No evidence of acute intracranial hemorrhage or infarct. Ventricular size is normal. Cerebellum unremarkable. Vascular: Unremarkable Skull: Intact Sinuses/Orbits: Paranasal sinuses are clear. Orbits are unremarkable. Other: Mastoid air cells and middle ear cavities are clear. IMPRESSION: 1. No acute intracranial abnormality. Electronically Signed   By: Dorethia Molt M.D.   On: 01/22/2024 02:29   CT Angio Chest/Abd/Pel for Dissection W and/or Wo Contrast Result Date: 01/21/2024 CLINICAL DATA:  Acute aortic  syndrome suspected. Chest pain and back pain. EXAM: CT ANGIOGRAPHY CHEST, ABDOMEN AND PELVIS TECHNIQUE: Non-contrast CT of the chest was initially obtained. Multidetector CT imaging through the chest, abdomen and pelvis was performed using the standard protocol during bolus administration of intravenous contrast. Multiplanar reconstructed images and MIPs were obtained and reviewed to evaluate the vascular anatomy. RADIATION DOSE REDUCTION: This exam was performed according to the departmental dose-optimization program which includes automated  exposure control, adjustment of the mA and/or kV according to patient size and/or use of iterative reconstruction technique. CONTRAST:  OMNIPAQUE  IOHEXOL  350 MG/ML SOLN COMPARISON:  None Available. FINDINGS: CTA CHEST FINDINGS Cardiovascular: Preferential opacification of the thoracic aorta. No evidence of thoracic aortic aneurysm or dissection. Heart is mildly enlarged. No pericardial effusion. Mediastinum/Nodes: No enlarged mediastinal, hilar, or axillary lymph nodes. Thyroid  gland, trachea, and esophagus demonstrate no significant findings. Lungs/Pleura: There is minimal atelectasis in the right lower lobe. The lungs are otherwise clear. There is no pleural effusion or pneumothorax. Musculoskeletal: No chest wall abnormality. No acute or significant osseous findings. Review of the MIP images confirms the above findings. CTA ABDOMEN AND PELVIS FINDINGS VASCULAR Aorta: Normal caliber aorta without aneurysm, dissection, vasculitis or significant stenosis. Celiac: Patent without evidence of aneurysm, dissection, vasculitis or significant stenosis. SMA: Patent without evidence of aneurysm, dissection, vasculitis or significant stenosis. Renals: Both renal arteries are patent without evidence of aneurysm, dissection, vasculitis, fibromuscular dysplasia or significant stenosis. IMA: Patent without evidence of aneurysm, dissection, vasculitis or significant stenosis. Inflow:  Patent without evidence of aneurysm, dissection, vasculitis or significant stenosis. Veins: No obvious venous abnormality within the limitations of this arterial phase study. Review of the MIP images confirms the above findings. NON-VASCULAR Hepatobiliary: No focal liver abnormality is seen. No gallstones, gallbladder wall thickening, or biliary dilatation. Pancreas: Unremarkable. No pancreatic ductal dilatation or surrounding inflammatory changes. Spleen: Normal in size without focal abnormality. Adrenals/Urinary Tract: Adrenal glands are unremarkable. Kidneys are normal, without renal calculi, focal lesion, or hydronephrosis. Bladder is unremarkable. Stomach/Bowel: Stomach is within normal limits. Appendix appears normal. No evidence of bowel wall thickening, distention, or inflammatory changes. There is diffuse colonic diverticulosis. Lymphatic: No enlarged lymph nodes are seen. Reproductive: Uterus and bilateral adnexa are unremarkable. Other: No abdominal wall hernia or abnormality. No abdominopelvic ascites. Musculoskeletal: No acute or significant osseous findings. Review of the MIP images confirms the above findings. IMPRESSION: 1. No evidence for aortic dissection or aneurysm. 2. Mild cardiomegaly. 3. Colonic diverticulosis. Electronically Signed   By: Greig Pique M.D.   On: 01/21/2024 19:38   DG Chest 2 View Result Date: 01/21/2024 CLINICAL DATA:  Chest pain and shortness of breath EXAM: CHEST - 2 VIEW COMPARISON:  Chest x-ray 01/02/2022 FINDINGS: Patient is slightly rotated. The heart size and mediastinal contours are within normal limits. Both lungs are clear. The visualized skeletal structures are unremarkable. IMPRESSION: No active cardiopulmonary disease. Electronically Signed   By: Greig Pique M.D.   On: 01/21/2024 18:10    Microbiology: Results for orders placed or performed during the hospital encounter of 05/28/22  Resp Panel by RT-PCR (Flu A&B, Covid) Anterior Nasal Swab     Status:  Abnormal   Collection Time: 05/28/22 11:06 AM   Specimen: Anterior Nasal Swab  Result Value Ref Range Status   SARS Coronavirus 2 by RT PCR NEGATIVE NEGATIVE Final    Comment: (NOTE) SARS-CoV-2 target nucleic acids are NOT DETECTED.  The SARS-CoV-2 RNA is generally detectable in upper respiratory specimens during the acute phase of infection. The lowest concentration of SARS-CoV-2 viral copies this assay can detect is 138 copies/mL. A negative result does not preclude SARS-Cov-2 infection and should not be used as the sole basis for treatment or other patient management decisions. A negative result may occur with  improper specimen collection/handling, submission of specimen other than nasopharyngeal swab, presence of viral mutation(s) within the areas targeted by this assay, and inadequate number of viral copies(<138 copies/mL).  A negative result must be combined with clinical observations, patient history, and epidemiological information. The expected result is Negative.  Fact Sheet for Patients:  BloggerCourse.com  Fact Sheet for Healthcare Providers:  SeriousBroker.it  This test is no t yet approved or cleared by the [place]  FDA and  has been authorized for detection and/or diagnosis of SARS-CoV-2 by FDA under an Emergency Use Authorization (EUA). This EUA will remain  in effect (meaning this test can be used) for the duration of the COVID-19 declaration under Section 564(b)(1) of the Act, 21 U.S.C.section 360bbb-3(b)(1), unless the authorization is terminated  or revoked sooner.       Influenza A by PCR POSITIVE (A) NEGATIVE Final   Influenza B by PCR NEGATIVE NEGATIVE Final    Comment: (NOTE) The Xpert Xpress SARS-CoV-2/FLU/RSV plus assay is intended as an aid in the diagnosis of influenza from Nasopharyngeal swab specimens and should not be used as a sole basis for treatment. Nasal washings and aspirates are  unacceptable for Xpert Xpress SARS-CoV-2/FLU/RSV testing.  Fact Sheet for Patients: BloggerCourse.com  Fact Sheet for Healthcare Providers: SeriousBroker.it  This test is not yet approved or cleared by the [place]  FDA and has been authorized for detection and/or diagnosis of SARS-CoV-2 by FDA under an Emergency Use Authorization (EUA). This EUA will remain in effect (meaning this test can be used) for the duration of the COVID-19 declaration under Section 564(b)(1) of the Act, 21 U.S.C. section 360bbb-3(b)(1), unless the authorization is terminated or revoked.  Performed at [facility], [address] [place], [place] [address]     Labs: CBC: Recent Labs  Lab 01/21/24 1707 01/21/24 2342  WBC 7.5 5.7  NEUTROABS 6.1  --   HGB 11.8* 10.9*  HCT 36.5 34.7*  MCV 89.5 93.5  PLT 234 170   Basic Metabolic Panel: Recent Labs  Lab 01/21/24 1707 01/21/24 1939 01/21/24 2342 01/22/24 1050  NA 139  --   --  138  K 4.0  --   --  3.3*  CL 105  --   --  103  CO2 19*  --   --  23  GLUCOSE 126*  --   --  101*  BUN 19  --   --  15  CREATININE 1.05*  --  0.86 1.08*  CALCIUM  9.0  --   --  8.7*  MG  --  1.7  --  1.9  PHOS  --   --   --  3.4   Liver Function Tests: Recent Labs  Lab 01/21/24 1707 01/22/24 1050  AST 29  --   ALT 24  --   ALKPHOS 63  --   BILITOT 0.9  --   PROT 6.8  --   ALBUMIN 3.9 3.6   CBG: No results for input(s): GLUCAP in the last 168 hours.  Discharge time spent: 35 minutes.  Signed: Concepcion Riser, MD [facility] 01/23/2024

## 2024-01-24 ENCOUNTER — Telehealth: Payer: Self-pay | Admitting: Family Medicine

## 2024-01-24 ENCOUNTER — Telehealth: Payer: Self-pay

## 2024-01-24 NOTE — Telephone Encounter (Signed)
 Please contact patient to schedule hospital follow up within 14 days if possible.   We will address referral during appt with Dr. Katrinka.    Copied from CRM [phone]. Topic: Appointments - Appointment Scheduling >> Jan 24, 2024  4:30 PM Charolett L wrote: Patient/patient representative is calling to schedule an appointment.  Patient was just discharged from the hospital 08/11 and needs a follow up appt within 14 days  Also needs a referral for a cardiologist

## 2024-01-24 NOTE — Transitions of Care (Post Inpatient/ED Visit) (Signed)
   01/24/2024  Name: Carla West MRN: [id number] DOB: 1960-08-07  Today's TOC FU Call Status: Today's TOC FU Call Status:: Unsuccessful Call (1st Attempt) Unsuccessful Call (1st Attempt) Date: 01/24/24  Attempted to reach the patient regarding the most recent Inpatient/ED visit.  Follow Up Plan: Additional outreach attempts will be made to reach the patient to complete the Transitions of Care (Post Inpatient/ED visit) call.   Signature Julian Lemmings, LPN [facility] Nurse Health Advisor Direct Dial [phone]

## 2024-01-25 NOTE — Transitions of Care (Post Inpatient/ED Visit) (Signed)
   01/25/2024  Name: Carla West MRN: [id number] DOB: 03-Feb-1961  Today's TOC FU Call Status: Today's TOC FU Call Status:: Unsuccessful Call (2nd Attempt) Unsuccessful Call (1st Attempt) Date: 01/24/24 Unsuccessful Call (2nd Attempt) Date: 01/25/24  Attempted to reach the patient regarding the most recent Inpatient/ED visit.  Follow Up Plan: Additional outreach attempts will be made to reach the patient to complete the Transitions of Care (Post Inpatient/ED visit) call.   Signature Julian Lemmings, LPN [facility] Nurse Health Advisor Direct Dial [phone]

## 2024-01-26 NOTE — Transitions of Care (Post Inpatient/ED Visit) (Signed)
   01/26/2024  Name: Sheina Mcleish MRN: [id number] DOB: January 25, 1961  Today's TOC FU Call Status: Today's TOC FU Call Status:: Unsuccessful Call (3rd Attempt) Unsuccessful Call (1st Attempt) Date: 01/24/24 Unsuccessful Call (2nd Attempt) Date: 01/25/24 Unsuccessful Call (3rd Attempt) Date: 01/26/24  Attempted to reach the patient regarding the most recent Inpatient/ED visit.  Follow Up Plan: No further outreach attempts will be made at this time. We have been unable to contact the patient.  Signature Julian Lemmings, LPN [facility] Nurse Health Advisor Direct Dial [phone]

## 2024-02-06 ENCOUNTER — Inpatient Hospital Stay: Admitting: Family Medicine

## 2024-02-10 ENCOUNTER — Other Ambulatory Visit: Payer: Self-pay | Admitting: Family Medicine

## 2024-02-10 ENCOUNTER — Ambulatory Visit (INDEPENDENT_AMBULATORY_CARE_PROVIDER_SITE_OTHER): Admitting: Family Medicine

## 2024-02-10 ENCOUNTER — Encounter: Payer: Self-pay | Admitting: Family Medicine

## 2024-02-10 VITALS — BP 114/70 | HR 82 | Temp 98.0°F | Ht 67.0 in | Wt 168.0 lb

## 2024-02-10 DIAGNOSIS — G43809 Other migraine, not intractable, without status migrainosus: Secondary | ICD-10-CM

## 2024-02-10 DIAGNOSIS — E0590 Thyrotoxicosis, unspecified without thyrotoxic crisis or storm: Secondary | ICD-10-CM | POA: Diagnosis not present

## 2024-02-10 DIAGNOSIS — I2510 Atherosclerotic heart disease of native coronary artery without angina pectoris: Secondary | ICD-10-CM | POA: Diagnosis not present

## 2024-02-10 MED ORDER — TRAZODONE HCL 50 MG PO TABS: 50.0000 mg | ORAL_TABLET | Freq: Every evening | ORAL | 5 refills | Status: DC | PRN

## 2024-02-10 MED ORDER — HYDROXYZINE HCL 25 MG PO TABS: 25.0000 mg | ORAL_TABLET | Freq: Four times a day (QID) | ORAL | 3 refills | Status: DC | PRN

## 2024-02-10 MED ORDER — ATORVASTATIN CALCIUM 80 MG PO TABS
80.0000 mg | ORAL_TABLET | Freq: Every day | ORAL | 3 refills | Status: AC
Start: 2024-02-10 — End: ?

## 2024-02-10 NOTE — Patient Instructions (Addendum)
 Please stop by lab before you go If you have mychart- we will send your results within 3 business days of us  receiving them.  If you do not have mychart- we will call you about results within 5 business days of us  receiving them.  *please also note that you will see labs on mychart as soon as they post. I will later go in and write notes on them- will say notes from Dr. Katrinka   We have placed a referral for you today to neurology Dr. Skeet and cardiology- trying Dr. Cherrie and otherwise Dr. Burton- please call their # if you do not hear within a week (may be listed below or you may see mychart message within a few days with #).   I do think the vomiting was related to migraines- I do not think cardiac related but referring back priamrily to reestablish  Recommended follow up: Return for next already scheduled visit or sooner if needed.

## 2024-02-10 NOTE — Progress Notes (Signed)
 Phone [phone] In person visit   Subjective:   Carla West is a 63 y.o. year old very pleasant female patient who presents for/with See problem oriented charting Chief Complaint  Patient presents with   Hospitalization Follow-up   Headache   Past Medical History-  Patient Active Problem List   Diagnosis Date Noted   CAD (coronary artery disease) 11/07/2020    Priority: High   Hyperthyroidism 02/26/2019    Priority: High   Essential hypertension 08/03/2015    Priority: High   Vitamin D  deficiency 11/16/2023    Priority: Medium    Hyperglycemia 08/05/2022    Priority: Medium    Palpitations 04/11/2018    Priority: Medium    Hyperlipidemia 12/28/2017    Priority: Medium    Osteopenia of lumbar spine 12/07/2017    Priority: Medium    Migraine headache 08/03/2015    Priority: Medium    Skin lesion 11/05/2020    Priority: Low   Abnormal Pap smear of cervix 08/03/2015    Priority: Low   Intractable nausea and vomiting 01/21/2024    Medications- reviewed and updated Current Outpatient Medications  Medication Sig Dispense Refill   amLODipine  (NORVASC ) 5 MG tablet Take 1 tablet (5 mg total) by mouth daily. 90 tablet 3   aspirin  EC 81 MG tablet Take 1 tablet (81 mg total) by mouth daily. 90 tablet 3   atorvastatin  (LIPITOR ) 80 MG tablet Take 1 tablet (80 mg total) by mouth daily. 90 tablet 3   ezetimibe  (ZETIA ) 10 MG tablet Take 1 tablet (10 mg total) by mouth daily. 90 tablet 3   losartan  (COZAAR ) 50 MG tablet TAKE 1 TABLET(50 MG) BY MOUTH DAILY 90 tablet 3   methimazole  (TAPAZOLE ) 10 MG tablet Take 1 tablet (10 mg total) by mouth daily. 90 tablet 3   metoprolol  succinate (TOPROL -XL) 50 MG 24 hr tablet Take 1 tablet (50 mg total) by mouth daily. Take with or immediately following a meal. 90 tablet 3   ondansetron  (ZOFRAN -ODT) 4 MG disintegrating tablet Take 1 tablet (4 mg total) by mouth every 8 (eight) hours as needed. 20 tablet 5   promethazine  (PHENERGAN )  25 MG tablet Take 25 mg by mouth every 8 (eight) hours as needed for vomiting or nausea.     triamterene -hydrochlorothiazide  (MAXZIDE -25) 37.5-25 MG tablet Take 1 tablet by mouth daily. 90 tablet 3   hydrOXYzine  (ATARAX ) 25 MG tablet Take 1 tablet (25 mg total) by mouth every 6 (six) hours as needed for anxiety. 1 tab po q hs prn insomnia 90 tablet 3   traZODone  (DESYREL ) 50 MG tablet Take 1 tablet (50 mg total) by mouth at bedtime as needed for sleep. Take one tablet (50mg ) by mouth at bedtime as needed for sleep. 30 tablet 5   No current facility-administered medications for this visit.     Objective:  BP 114/70   Pulse 82   Temp 98 F (36.7 C) (Temporal)   Ht 5' 7 (1.702 m)   Wt 168 lb (76.2 kg)   SpO2 98%   BMI 26.31 kg/m  Gen: NAD, resting comfortably CV: RRR no murmurs rubs or gallops Lungs: CTAB no crackles, wheeze, rhonchi Abdomen: soft/nontender/nondistended/normal bowel sounds.  Skin: warm, dry     Assessment and Plan   # Hospital follow-up for intractable nausea and vomiting  S: Patient presented to the hospital on 01/21/2024 and was discharged on 01/23/2024 for intractable nausea and vomiting.  She initially reported chest pain as well but thankfully  troponin trend was negative.  Cardiology evaluated patient especially with her history of stent even though that occurred around the time of thyrotoxicosis including with reassuring echocardiogram and deemed symptoms to be noncardiac.  Nausea and vomiting persisted. CT head 01/22/24 with no acute intracranial abnormality. Reports she had woken up with a headache that Saturday- went to an event and knew she was going to get a migraine. She filled 2 stanley cups with emesis. Also had CT angio chest/abdomen/pelvis with no dissection or aneurysm noted.   She was having headaches as noted above and ultimately they suspected episodes were related to migraines.  She was given Zofran  as needed and discharged with this.  She had not had  recent migraines as long as on amlodipine . She had bene on emgality  over a year ago.  She was advised outpatient follow-up with neurology- last saw Dr. Skeet about a year ago - prior also given samples of Ubrelvy with plan to stop sumatriptan  due to coronary artery disease  Chronic conditions her Lipitor  was changed to rosuvastatin  40 mg with hopes to push LDL under 70 at least with prior history of NSTEMI in 2020.  Dr. Lavona also recommended LPA outpatient   Blood pressure appeared to be elevated at times during the hospitalization but has improved outpatient  Since being back home has remained anxious waking up each am after how difficult these recent events were. Has mild abdominal discomfort and worries could haverecurrence but so far has not and no vomiting. Trying to be intentional about eating in AM and that does seem to help.  -she reports still on atorvastatin  80 mg, not rosuvastatin  40 mg - no agranulocytosis on methmiazole- thyroid  was not checked in hospital though   A/P: nausea and vomiting could have bene migraine related or abdominal migraine related -refer back to Dr. Skeet. May need to restart emgality  -reestablish care with cardiology though had reassuring echocardiogram in hospital as well as troponin trend and no recurrent chest pain -They did not check her thyroid  labs in the hospital and I want to update those in case off and potentially triggering symptoms  Other chronic issues listed:    #CAD with history of stent 02/27/2019  At time of active graves disease #hyperlipidemia-LDL goal under 70 S: Medication: aspirin  81mg , atorvastatin  80mg , zetia  added 11/07/2020 Lab Results  Component Value Date   CHOL 189 01/22/2024   HDL 87 01/22/2024   LDLCALC 98 01/22/2024   LDLDIRECT 90.0 11/10/2023   TRIG 18 01/22/2024   CHOLHDL 2.2 01/22/2024    A/P: Patient with history of stent during time of active Graves' disease/thyrotoxicosis and has not seen cardiology recently-we  opted to refer her back.  She is specifically requesting seeing Dr. Bettyjane if possible as her mother and brother were cared for by him but she is also very willing to see Dr. Vernice as well who she reports did a great job previously - In the hospital they changed her atorvastatin  to rosuvastatin  but she was not aware of the change and she continues to take atorvastatin  80 mg-we updated med list.  Referring back to cardiology and may need to get established with lipid clinic with LDL not at goal of 70 or less despite atorvastatin  80 mg and Zetia  10 mg -Also check LPA as per inpatient cardiology recommendations  #Resistant hypertension- hyperthyroidism possibly contributing in the past S: medication: losartan  50mg  AM, metoprolol  50mg  XR PM, triamterene -hctz 37.5-25mg  PM, amlodipine  restart mid 2025 at 5 mg- prior 10 mg but  blood pressure came down -normal renal arteries 03/12/20 A/P: Blood pressure well-controlled today.  Was elevated in the hospital when in pain-hopefully we can prevent further migraines or abdominal migraines  #hyperthyroidism/graves disease-follows with Dr. Sam but I had most recently prescribed methimazole  S: compliant On thyroid  medication-methimazole  10mg  - RAI or surgery in active consideration in the past but she has wanted to hold off  A/P: Hyperthyroidism hopefully adequately treated-update TSH, T3 and T4  # Migraines-established with Dr. Skeet.  S:Emgality  trial January 27, 2023 with [place] as backup 3 per week. Worse with BP higher. BP possibly higher from graves initially - Prior almotriptan  prn - was told ok in past as nstemi related to thyrotoxicosis with Dr. Skeet that would be safer to avoid this A/P: Migraines potentially poorly controlled with possible recent abdominal migraine episode and prolonged hospitalization-refer back to neurology as above  # Insomnia-request refill of hydroxyzine  which she uses for daytime anxiety sometimes as well as trazodone   which is helpful for sleep-refilled both today  Recommended follow up: Return for next already scheduled visit or sooner if needed. Future Appointments  Date Time Provider Department Center  05/17/2024  8:20 AM Katrinka Garnette KIDD, MD [facility]  11/22/2024  8:00 AM Katrinka Garnette KIDD, MD [facility]    Lab/Order associations:   ICD-10-CM   1. Coronary artery disease involving native coronary artery of native heart without angina pectoris  I25.10 Ambulatory referral to Cardiology    CBC with Differential/Platelet    Comprehensive metabolic panel with GFR    Lipoprotein A (LPA)    Lipoprotein A (LPA)    Comprehensive metabolic panel with GFR    CBC with Differential/Platelet    CANCELED: Lipoprotein A (LPA)    CANCELED: Comprehensive metabolic panel with GFR    CANCELED: CBC with Differential/Platelet    2. Other migraine without status migrainosus, not intractable  G43.809 Ambulatory referral to Neurology    CBC with Differential/Platelet    Comprehensive metabolic panel with GFR    Comprehensive metabolic panel with GFR    CBC with Differential/Platelet    CANCELED: Comprehensive metabolic panel with GFR    CANCELED: CBC with Differential/Platelet    3. Hyperthyroidism  E05.90 T3, free    T4, free    TSH    TSH    T4, free    T3, free    CANCELED: TSH    CANCELED: T4, free    CANCELED: T3, free      Meds ordered this encounter  Medications   hydrOXYzine  (ATARAX ) 25 MG tablet    Sig: Take 1 tablet (25 mg total) by mouth every 6 (six) hours as needed for anxiety. 1 tab po q hs prn insomnia    Dispense:  90 tablet    Refill:  3   traZODone  (DESYREL ) 50 MG tablet    Sig: Take 1 tablet (50 mg total) by mouth at bedtime as needed for sleep. Take one tablet (50mg ) by mouth at bedtime as needed for sleep.    Dispense:  30 tablet    Refill:  5   atorvastatin  (LIPITOR ) 80 MG tablet    Sig: Take 1 tablet (80 mg total) by mouth daily.    Dispense:  90 tablet     Refill:  3   I personally spent a total of 45 minutes in the care of the patient today including preparing to see the patient including reviewing and summarizing hospital discharge summary and notes including cardiology inpatient  consultation, getting/reviewing separately obtained history, performing a medically appropriate exam/evaluation, counseling and educating particularly how migraines could be associated with her symptoms and importance of both neurology and cardiology follow-up, placing orders, and documenting clinical information in the EHR.   Return precautions advised.  Garnette Lukes, MD

## 2024-02-11 ENCOUNTER — Ambulatory Visit: Payer: Self-pay | Admitting: Family Medicine

## 2024-02-11 DIAGNOSIS — E7841 Elevated Lipoprotein(a): Secondary | ICD-10-CM

## 2024-02-14 ENCOUNTER — Other Ambulatory Visit: Payer: Self-pay | Admitting: Family Medicine

## 2024-02-14 LAB — COMPREHENSIVE METABOLIC PANEL WITH GFR
AG Ratio: 1.6 (calc) (ref 1.0–2.5)
ALT: 25 U/L (ref 6–29)
AST: 20 U/L (ref 10–35)
Albumin: 4.1 g/dL (ref 3.6–5.1)
Alkaline phosphatase (APISO): 68 U/L (ref 37–153)
BUN/Creatinine Ratio: 23 (calc) — ABNORMAL HIGH (ref 6–22)
BUN: 25 mg/dL (ref 7–25)
CO2: 25 mmol/L (ref 20–32)
Calcium: 9.1 mg/dL (ref 8.6–10.4)
Chloride: 106 mmol/L (ref 98–110)
Creat: 1.1 mg/dL — ABNORMAL HIGH (ref 0.50–1.05)
Globulin: 2.5 g/dL (ref 1.9–3.7)
Glucose, Bld: 96 mg/dL (ref 65–99)
Potassium: 3.8 mmol/L (ref 3.5–5.3)
Sodium: 141 mmol/L (ref 135–146)
Total Bilirubin: 0.3 mg/dL (ref 0.2–1.2)
Total Protein: 6.6 g/dL (ref 6.1–8.1)
eGFR: 57 mL/min/1.73m2 — ABNORMAL LOW (ref >=60)

## 2024-02-14 LAB — CBC WITH DIFFERENTIAL/PLATELET
Absolute Lymphocytes: 1364 {cells}/uL (ref 850–3900)
Absolute Monocytes: 426 {cells}/uL (ref 200–950)
Basophils Absolute: 19 {cells}/uL (ref 0–200)
Basophils Relative: 0.5 %
Eosinophils Absolute: 42 {cells}/uL (ref 15–500)
Eosinophils Relative: 1.1 %
HCT: 37 % (ref 35.0–45.0)
Hemoglobin: 12 g/dL (ref 11.7–15.5)
MCH: 29 pg (ref 27.0–33.0)
MCHC: 32.4 g/dL (ref 32.0–36.0)
MCV: 89.4 fL (ref 80.0–100.0)
MPV: 9.3 fL (ref 7.5–12.5)
Monocytes Relative: 11.2 %
Neutro Abs: 1949 {cells}/uL (ref 1500–7800)
Neutrophils Relative %: 51.3 %
Platelets: 238 Thousand/uL (ref 140–400)
RBC: 4.14 Million/uL (ref 3.80–5.10)
RDW: 12.8 % (ref 11.0–15.0)
Total Lymphocyte: 35.9 %
WBC: 3.8 Thousand/uL (ref 3.8–10.8)

## 2024-02-14 LAB — LIPOPROTEIN A (LPA): Lipoprotein (a): 264 nmol/L — ABNORMAL HIGH (ref <75)

## 2024-02-14 LAB — T3, FREE: T3, Free: 3 pg/mL (ref 2.3–4.2)

## 2024-02-14 LAB — T4, FREE: Free T4: 1.2 ng/dL (ref 0.8–1.8)

## 2024-02-14 LAB — TSH: TSH: 1.09 m[IU]/L (ref 0.40–4.50)

## 2024-03-07 ENCOUNTER — Other Ambulatory Visit: Payer: Self-pay

## 2024-03-07 ENCOUNTER — Ambulatory Visit
Admission: RE | Admit: 2024-03-07 | Discharge: 2024-03-07 | Disposition: A | Discharge status: Home / Self Care | Attending: Physician Assistant | Admitting: Physician Assistant

## 2024-03-07 VITALS — BP 133/91 | HR 61 | Temp 98.3°F | Resp 17 | Ht 67.0 in | Wt 166.0 lb

## 2024-03-07 DIAGNOSIS — M436 Torticollis: Secondary | ICD-10-CM | POA: Diagnosis not present

## 2024-03-07 MED ORDER — TRIAMCINOLONE ACETONIDE 40 MG/ML IJ SUSP
40.0000 mg | Freq: Once | INTRAMUSCULAR | Status: AC
  Administered 2024-03-07: 40 mg via INTRAMUSCULAR

## 2024-03-07 MED ORDER — METHOCARBAMOL 500 MG PO TABS: 500.0000 mg | ORAL_TABLET | Freq: Two times a day (BID) | ORAL | 0 refills | Status: AC

## 2024-03-07 NOTE — Discharge Instructions (Addendum)
 VISIT SUMMARY:  You came in today with severe right-sided neck pain that started this morning and has been radiating to your shoulder and back. The pain is sharp and worsens with movement, and you also feel tingling sensations. You have tried using a heating pad and Aleve, but they did not provide significant relief.  YOUR PLAN:  -RIGHT NECK AND SHOULDER MUSCLE SPASM (TORTICOLLIS): This condition involves a sudden, severe muscle spasm in the neck, causing pain and difficulty in moving the head. To help reduce the inflammation and muscle spasm, you received a steroid injection today. You were informed about potential side effects such as increased blood sugar, hunger, irritability, and possible sleeplessness. Additionally, you were prescribed a muscle relaxer to help with the pain, but be cautious as it can cause drowsiness. You should continue using a heating pad and avoid ice- 15 minutes on, at least 30 minutes off to avoid skin irritation or burns. Gentle stretches and massage to the affected area are recommended, and you may consider medical massage therapy for further relief.  INSTRUCTIONS:  Please follow up if your symptoms do not improve or if they worsen. Continue using the heating pad and perform gentle stretches as advised. Be cautious with the muscle relaxer and avoid activities that require full alertness. Consider scheduling a medical massage therapy session for additional relief.

## 2024-03-07 NOTE — ED Triage Notes (Signed)
 Pt presents with a chief complaint of posterior neck pain that is radiating down into the back. Pain began this morning. Has gotten worse throughout the day. Currently rates overall neck pain a 9/10. Pain increases with movement of the body. Describes as shooting and burning. Two Aleve taken today + heat applied with no improvement/relief.

## 2024-03-07 NOTE — ED Provider Notes (Signed)
 [facility]    CSN: [id number] Arrival date & time: 03/07/24  1657      History   Chief Complaint Chief Complaint  Patient presents with  . Neck Pain    HPI Carla West is a 63 y.o. female.  has a past medical history of Abnormal Pap smear of cervix (08/03/2015), Anxiety, Benign essential HTN (08/03/2015), Depression, Graves disease, History of chicken pox, and Migraine headache (08/03/2015).   HPI  Discussed the use of AI scribe software for clinical note transcription with the patient, who gave verbal consent to proceed.     Past Medical History:  Diagnosis Date  . Abnormal Pap smear of cervix 08/03/2015  . Anxiety   . Benign essential HTN 08/03/2015  . Depression   . Graves disease   . History of chicken pox   . Migraine headache 08/03/2015    Patient Active Problem List   Diagnosis Date Noted  . Intractable nausea and vomiting 01/21/2024  . Vitamin D  deficiency 11/16/2023  . Hyperglycemia 08/05/2022  . CAD (coronary artery disease) 11/07/2020  . Skin lesion 11/05/2020  . Hyperthyroidism 02/26/2019  . Palpitations 04/11/2018  . Hyperlipidemia 12/28/2017  . Osteopenia of lumbar spine 12/07/2017  . Essential hypertension 08/03/2015  . Migraine headache 08/03/2015  . Abnormal Pap smear of cervix 08/03/2015    Past Surgical History:  Procedure Laterality Date  . CORONARY STENT INTERVENTION N/A 02/27/2019   Procedure: CORONARY STENT INTERVENTION;  Surgeon: Verlin Lonni BIRCH, MD;  Location: [facility];  Service: Cardiovascular;  Laterality: N/A;  . LEFT HEART CATH AND CORONARY ANGIOGRAPHY N/A 02/27/2019   Procedure: LEFT HEART CATH AND CORONARY ANGIOGRAPHY;  Surgeon: Verlin Lonni BIRCH, MD;  Location: [facility];  Service: Cardiovascular;  Laterality: N/A;  . TUBAL LIGATION      OB History   No obstetric history on file.      Home Medications    Prior to Admission medications   Medication Sig Start Date  End Date Taking? Authorizing Provider  methocarbamol  (ROBAXIN ) 500 MG tablet Take 1 tablet (500 mg total) by mouth 2 (two) times daily. 03/07/24  Yes Mahum Betten E, PA-C  amLODipine  (NORVASC ) 5 MG tablet Take 1 tablet (5 mg total) by mouth daily. 11/10/23   Katrinka Garnette KIDD, MD  aspirin  EC 81 MG tablet Take 1 tablet (81 mg total) by mouth daily. 01/29/19   Krasowski, Robert J, MD  atorvastatin  (LIPITOR ) 80 MG tablet Take 1 tablet (80 mg total) by mouth daily. 02/10/24   Katrinka Garnette KIDD, MD  ezetimibe  (ZETIA ) 10 MG tablet Take 1 tablet (10 mg total) by mouth daily. 11/10/23   Katrinka Garnette KIDD, MD  hydrOXYzine  (ATARAX ) 25 MG tablet TAKE 1 TABLET BY MOUTH EVERY 6 (SIX) HOURS AS NEEDED FOR ANXIETY. TAKE 1 TABLET BY MOUTH EVERY DAY AT BEDTIME FOR INSOMNIA 02/14/24   Katrinka Garnette KIDD, MD  losartan  (COZAAR ) 50 MG tablet TAKE 1 TABLET(50 MG) BY MOUTH DAILY 11/16/23   Katrinka Garnette KIDD, MD  methimazole  (TAPAZOLE ) 10 MG tablet Take 1 tablet (10 mg total) by mouth daily. 11/10/23   Katrinka Garnette KIDD, MD  metoprolol  succinate (TOPROL -XL) 50 MG 24 hr tablet Take 1 tablet (50 mg total) by mouth daily. Take with or immediately following a meal. 11/10/23   Katrinka Garnette KIDD, MD  ondansetron  (ZOFRAN -ODT) 4 MG disintegrating tablet Take 1 tablet (4 mg total) by mouth every 8 (eight) hours as needed. 01/27/23   Skeet Juliene SAUNDERS,  DO  promethazine  (PHENERGAN ) 25 MG tablet Take 25 mg by mouth every 8 (eight) hours as needed for vomiting or nausea.    [provider]  traZODone  (DESYREL ) 50 MG tablet Take 1 tablet (50 mg total) by mouth at bedtime as needed for sleep. Take one tablet (50mg ) by mouth at bedtime as needed for sleep. 02/10/24   Katrinka Garnette KIDD, MD  triamterene -hydrochlorothiazide  (MAXZIDE -25) 37.5-25 MG tablet Take 1 tablet by mouth daily. 11/10/23 11/04/24  Katrinka Garnette KIDD, MD    Family History Family History  Problem Relation Age of Onset  . Hypertension Mother   . Heart disease Mother        in late  41s  . Hyperlipidemia Father   . Stroke Father        later in life  . Breast cancer Maternal Grandmother   . Hyperlipidemia Sister   . Other Brother        plan crash in [employer]  . Migraines Son   . Heart disease Brother        CHF, arrythmia  . Liver disease Brother        alcohol  . Hypertension Brother   . Gout Brother   . Hypertension Brother     Social History Social History   Tobacco Use  . Smoking status: Never  . Smokeless tobacco: Never  Vaping Use  . Vaping status: Never Used  Substance Use Topics  . Alcohol use: No  . Drug use: No     Allergies   Elavil  [amitriptyline  hcl]   Review of Systems Review of Systems  Constitutional:  Negative for fever.  Gastrointestinal:  Negative for nausea and vomiting.  Musculoskeletal:  Positive for arthralgias, back pain, neck pain and neck stiffness.  Neurological:  Negative for dizziness, weakness, light-headedness, numbness and headaches.     Physical Exam Triage Vital Signs ED Triage Vitals  Encounter Vitals Group     BP 03/07/24 1717 (!) 133/91     Girls Systolic BP Percentile --      Girls Diastolic BP Percentile --      Boys Systolic BP Percentile --      Boys Diastolic BP Percentile --      Pulse Rate 03/07/24 1717 61     Resp 03/07/24 1717 17     Temp 03/07/24 1717 98.3 F (36.8 C)     Temp Source 03/07/24 1717 Oral     SpO2 03/07/24 1717 96 %     Weight 03/07/24 1719 166 lb (75.3 kg)     Height 03/07/24 1719 5' 7 (1.702 m)     Head Circumference --      Peak Flow --      Pain Score 03/07/24 1718 9     Pain Loc --      Pain Education --      Exclude from Growth Chart --    No data found.  Updated Vital Signs BP (!) 133/91 (BP Location: Right Arm)   Pulse 61   Temp 98.3 F (36.8 C) (Oral)   Resp 17   Ht 5' 7 (1.702 m)   Wt 166 lb (75.3 kg)   SpO2 96%   BMI 26.00 kg/m   Visual Acuity Right Eye Distance:   Left Eye Distance:   Bilateral Distance:    Right Eye Near:   Left  Eye Near:    Bilateral Near:     Physical Exam Vitals reviewed.  Constitutional:  General: She is awake.     Appearance: Normal appearance. She is well-developed and well-groomed.  HENT:     Head: Normocephalic and atraumatic.  Eyes:     General: Lids are normal. Gaze aligned appropriately.     Extraocular Movements: Extraocular movements intact.     Conjunctiva/sclera: Conjunctivae normal.  Neck:     Comments: Notable spasms present along the right paraspinal muscles.  Patient has tenderness along the right paraspinal muscles with radiation into the right shoulder.  Patient is freely moving her right arm and does not appear to have decreased range of motion. Pulmonary:     Effort: Pulmonary effort is normal.  Musculoskeletal:     Cervical back: Spasms, torticollis and tenderness present. Pain with movement and muscular tenderness present. Decreased range of motion.  Neurological:     Mental Status: She is alert and oriented to person, place, and time.  Psychiatric:        Attention and Perception: Attention and perception normal.        Mood and Affect: Mood and affect normal.        Speech: Speech normal.        Behavior: Behavior normal. Behavior is cooperative.      UC Treatments / Results  Labs (all labs ordered are listed, but only abnormal results are displayed) Labs Reviewed - No data to display  EKG   Radiology No results found.  Procedures Procedures (including critical care time)  Medications Ordered in UC Medications  triamcinolone  acetonide (KENALOG -40) injection 40 mg (has no administration in time range)    Initial Impression / Assessment and Plan / UC Course  I have reviewed the triage vital signs and the nursing notes.  Pertinent labs & imaging results that were available during my care of the patient were reviewed by me and considered in my medical decision making (see chart for details).      Final Clinical Impressions(s) / UC Diagnoses    Final diagnoses:  Torticollis, acute     Discharge Instructions      VISIT SUMMARY:  You came in today with severe right-sided neck pain that started this morning and has been radiating to your shoulder and back. The pain is sharp and worsens with movement, and you also feel tingling sensations. You have tried using a heating pad and Aleve, but they did not provide significant relief.  YOUR PLAN:  -RIGHT NECK AND SHOULDER MUSCLE SPASM (TORTICOLLIS): This condition involves a sudden, severe muscle spasm in the neck, causing pain and difficulty in moving the head. To help reduce the inflammation and muscle spasm, you received a steroid injection today. You were informed about potential side effects such as increased blood sugar, hunger, irritability, and possible sleeplessness. Additionally, you were prescribed a muscle relaxer to help with the pain, but be cautious as it can cause drowsiness. You should continue using a heating pad and avoid ice- 15 minutes on, at least 30 minutes off to avoid skin irritation or burns. Gentle stretches and massage to the affected area are recommended, and you may consider medical massage therapy for further relief.  INSTRUCTIONS:  Please follow up if your symptoms do not improve or if they worsen. Continue using the heating pad and perform gentle stretches as advised. Be cautious with the muscle relaxer and avoid activities that require full alertness. Consider scheduling a medical massage therapy session for additional relief.     ED Prescriptions     Medication Sig Dispense Auth. Provider  methocarbamol  (ROBAXIN ) 500 MG tablet Take 1 tablet (500 mg total) by mouth 2 (two) times daily. 20 tablet Shamon Lobo E, PA-C      PDMP not reviewed this encounter.

## 2024-03-13 ENCOUNTER — Ambulatory Visit (HOSPITAL_BASED_OUTPATIENT_CLINIC_OR_DEPARTMENT_OTHER): Admitting: Internal Medicine

## 2024-03-13 ENCOUNTER — Telehealth: Payer: Self-pay | Admitting: Pharmacy Technician

## 2024-03-13 ENCOUNTER — Other Ambulatory Visit (HOSPITAL_COMMUNITY): Payer: Self-pay

## 2024-03-13 ENCOUNTER — Encounter (HOSPITAL_BASED_OUTPATIENT_CLINIC_OR_DEPARTMENT_OTHER): Payer: Self-pay | Admitting: Internal Medicine

## 2024-03-13 VITALS — BP 150/92 | HR 59 | Ht 67.0 in | Wt 168.3 lb

## 2024-03-13 DIAGNOSIS — E7841 Elevated Lipoprotein(a): Secondary | ICD-10-CM | POA: Diagnosis not present

## 2024-03-13 DIAGNOSIS — I10 Essential (primary) hypertension: Secondary | ICD-10-CM

## 2024-03-13 DIAGNOSIS — I2510 Atherosclerotic heart disease of native coronary artery without angina pectoris: Secondary | ICD-10-CM | POA: Diagnosis not present

## 2024-03-13 DIAGNOSIS — E785 Hyperlipidemia, unspecified: Secondary | ICD-10-CM | POA: Diagnosis not present

## 2024-03-13 MED ORDER — REPATHA SURECLICK 140 MG/ML ~~LOC~~ SOAJ: 140.0000 mg | SUBCUTANEOUS | 6 refills | Status: DC

## 2024-03-13 NOTE — Telephone Encounter (Signed)
 Pharmacy Patient Advocate Encounter   Received notification from Physician's Office- [place] that prior authorization for Repatha is required/requested.   Insurance verification completed.   The patient is insured through [facility] .   Per test claim: PA required; PA submitted to above mentioned insurance via Latent Key/confirmation #/EOC [facility] Status is pending

## 2024-03-13 NOTE — Telephone Encounter (Signed)
 Pt aware of PA approval for Repatha and rx with attached PA information sent to her preferred pharmacy of choice.  Pt verbalized understanding and agrees with this plan.

## 2024-03-13 NOTE — Addendum Note (Signed)
 Addended by: GLADIS PORTER HERO on: 03/13/2024 11:44 AM   Modules accepted: Orders

## 2024-03-13 NOTE — Patient Instructions (Signed)
 Medication Instructions:   Dr. Mona recommends REPATHA (PCSK9). This is an injectable cholesterol medication self-administered once every 14 days. This medication will likely need prior approval with your insurance company, which we will work on. If the medication is not approved initially, we may need to do an appeal with your insurance. If approved, we will provide you with copay and cost information. We'll then send the prescription to your pharmacy. We would have you complete another set of fasting labs between 3-4 months to reassess cholesterol.   REPATHA is self-injected once every 14 days in subcutaneous or fatty tissue - such as belly or side/outer/upper thigh. It is best stored in the refrigerator but is stable at room temp up to 28 days. Please take the pen-injector out of fridge about 30 minutes - 1 hour prior to injection, to allow it to warm closer to room temperature.   This medication is very effective in lowering LDL and can lower LPa, as Dr. Mona mentioned. It is also generally well tolerated -- most common reaction may be cold-like symptoms such as runny nose, scratchy throat, as this is an antibody therapy. It is generally self-limiting and after a few doses, your body should have normalized to the medication.   Here is a demo video: [email or web address]   If you need a co-pay card for Repatha: Lawsponsor.fr If you need a co-pay card for Praluent: [email or web address]  Patient Assistance:    These foundations have funds at various times.   The [employer] Foundation: https://www.panfoundation.org/disease-funds/hypercholesterolemia/ -- can sign up for wait list  [facility] offers assistance to help pay for medication copays.  They will cover copays for all cholesterol lowering meds, including statins, fibrates, omega-3 fish oils like Vascepa, ezetimibe , Repatha, Praluent, Nexletol,  Nexlizet.  The cards are usually good for $2,500 or 12 months, whichever comes first. Our fax # is [phone] (you will need this to apply) Go to healthwellfoundation.org Click on "Apply Now" Answer questions as to whom is applying (patient or representative) Your disease fund will be "hypercholesterolemia - Medicare access" They will ask questions about finances and which medications you are taking for cholesterol When you submit, the approval is usually within minutes.  You will need to print the card information from the site You will need to show this information to your pharmacy, they will bill your Medicare Part D plan first -then bill Health Well --for the copay.   You can also call them at [phone], although the hold times can be quite long.     *If you need a refill on your cardiac medications before your next appointment, please call your pharmacy*    You have been referred to [facility]ION [facility] TO BE SEEN--PLEASE SCHEDULE AT NEXT AVAILABLE    Lab Work:  IN 3 MONTHS (AROUND 12/30 AND AFTER)  HERE AT OUR [facility] ON THE 3RD FLOOR [address]--NMR LIPOPROFILE AND LIPOPROTEIN A--PLEASE COME FASTING TO THIS LAB APPOINTMENT  If you have labs (blood work) drawn today and your tests are completely normal, you will receive your results only by: MyChart Message (if you have MyChart) OR A paper copy in the mail If you have any lab test that is abnormal or we need to change your treatment, we will call you to review the results.    Follow-Up:  WITH HYPERTENSION CLINIC AT NEXT AVAILABLE APPOINTMENT PER DR. HILTY

## 2024-03-13 NOTE — Progress Notes (Signed)
 LIPID CLINIC CONSULT NOTE  Chief Complaint:  Manage dyslipidemia  Primary Care Physician: Katrinka Garnette KIDD, MD  Primary Cardiologist:  Darryle ONEIDA Decent, MD  HPI:  Carla West is a 63 y.o. female who is being seen today for the evaluation of dyslipidemia at the request of Katrinka Garnette KIDD, MD. is a pleasant 63 year old female kindly referred for evaluation management of dyslipidemia.  She has a history of coronary artery disease and underwent PCI to left circumflex in 2020.  There was also residual coronary disease at that time.  She ultimately was found to have an elevated LP(a) recently at 264 nmol/L.  Labs in August showed total cholesterol 189, triglycerides 18, HDL 87 and LDL 98.  She is on combination therapy with atorvastatin  80 mg daily and ezetimibe  10 mg daily.  She was primarily referred for elevated LP(a).  Today she also reported labile blood pressure issues.  She has been in emergency department several times for elevated blood pressure.  Primarily her issues are elevated blood pressures in the morning.  She is on a regimen of amlodipine , losartan , metoprolol  and Maxide.  PMHx:  Past Medical History:  Diagnosis Date   Abnormal Pap smear of cervix 08/03/2015   Anxiety    Benign essential HTN 08/03/2015   Depression    Graves disease    History of chicken pox    Migraine headache 08/03/2015    Past Surgical History:  Procedure Laterality Date   CORONARY STENT INTERVENTION N/A 02/27/2019   Procedure: CORONARY STENT INTERVENTION;  Surgeon: Verlin Lonni BIRCH, MD;  Location: [facility] INVASIVE CV LAB;  Service: Cardiovascular;  Laterality: N/A;   LEFT HEART CATH AND CORONARY ANGIOGRAPHY N/A 02/27/2019   Procedure: LEFT HEART CATH AND CORONARY ANGIOGRAPHY;  Surgeon: Verlin Lonni BIRCH, MD;  Location: [facility] INVASIVE CV LAB;  Service: Cardiovascular;  Laterality: N/A;   TUBAL LIGATION      FAMHx:  Family History  Problem Relation Age of Onset   Hypertension  Mother    Heart disease Mother        in late 54s   Hyperlipidemia Father    Stroke Father        later in life   Breast cancer Maternal Grandmother    Hyperlipidemia Sister    Other Brother        plan crash in [employer]   Migraines Son    Heart disease Brother        CHF, arrythmia   Liver disease Brother        alcohol   Hypertension Brother    Gout Brother    Hypertension Brother     SOCHx:   reports that she has never smoked. She has never used smokeless tobacco. She reports that she does not drink alcohol and does not use drugs.  ALLERGIES:  Allergies  Allergen Reactions   Elavil  [Amitriptyline  Hcl] Other (See Comments)    Jittery..the patient states she has not had any side effects     ROS: Pertinent items noted in HPI and remainder of comprehensive ROS otherwise negative.  HOME MEDS: Current Outpatient Medications on File Prior to Visit  Medication Sig Dispense Refill   aspirin  EC 81 MG tablet Take 1 tablet (81 mg total) by mouth daily. 90 tablet 3   atorvastatin  (LIPITOR ) 80 MG tablet Take 1 tablet (80 mg total) by mouth daily. 90 tablet 3   ezetimibe  (ZETIA ) 10 MG tablet Take 1 tablet (10 mg total) by mouth daily.  90 tablet 3   hydrOXYzine  (ATARAX ) 25 MG tablet TAKE 1 TABLET BY MOUTH EVERY 6 (SIX) HOURS AS NEEDED FOR ANXIETY. TAKE 1 TABLET BY MOUTH EVERY DAY AT BEDTIME FOR INSOMNIA 90 tablet 3   losartan  (COZAAR ) 50 MG tablet TAKE 1 TABLET(50 MG) BY MOUTH DAILY 90 tablet 3   methimazole  (TAPAZOLE ) 10 MG tablet Take 1 tablet (10 mg total) by mouth daily. 90 tablet 3   methocarbamol  (ROBAXIN ) 500 MG tablet Take 1 tablet (500 mg total) by mouth 2 (two) times daily. 20 tablet 0   metoprolol  succinate (TOPROL -XL) 50 MG 24 hr tablet Take 1 tablet (50 mg total) by mouth daily. Take with or immediately following a meal. 90 tablet 3   ondansetron  (ZOFRAN -ODT) 4 MG disintegrating tablet Take 1 tablet (4 mg total) by mouth every 8 (eight) hours as needed. 20 tablet 5    promethazine  (PHENERGAN ) 25 MG tablet Take 25 mg by mouth every 8 (eight) hours as needed for vomiting or nausea.     traZODone  (DESYREL ) 50 MG tablet Take 1 tablet (50 mg total) by mouth at bedtime as needed for sleep. Take one tablet (50mg ) by mouth at bedtime as needed for sleep. 30 tablet 5   triamterene -hydrochlorothiazide  (MAXZIDE -25) 37.5-25 MG tablet Take 1 tablet by mouth daily. 90 tablet 3   amLODipine  (NORVASC ) 5 MG tablet Take 1 tablet (5 mg total) by mouth daily. 90 tablet 3   No current facility-administered medications on file prior to visit.    LABS/IMAGING: No results found for this or any previous visit (from the past 48 hours). No results found.  LIPID PANEL:    Component Value Date/Time   CHOL 189 01/22/2024 0341   CHOL 154 04/04/2019 1010   TRIG 18 01/22/2024 0341   HDL 87 01/22/2024 0341   HDL 83 04/04/2019 1010   CHOLHDL 2.2 01/22/2024 0341   VLDL 4 01/22/2024 0341   LDLCALC 98 01/22/2024 0341   LDLCALC 194 (H) 01/01/2022 1631   LDLDIRECT 90.0 11/10/2023 1543    Lipoprotein (a)  Date/Time Value Ref Range Status  02/10/2024 03:20 PM 264 (H) <75 nmol/L Final    Comment:    . Risk Category   Optimal        < 75 nmol/L   Moderate   75 - 125 nmol/L   High          > 125 nmol/L . Cardiovascular event risk category cut points (optimal, moderate, high) are based on Tsimika S. JACC 2017;69:692-711. .      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   [name] has coronary artery disease and had a prior stent to the  circumflex artery in 2020.  She has an elevated LP(a) and it remains above target LDL less than 55 on combination high intensity statin and ezetimibe .  She is a good candidate to add PCSK9 inhibitor therapy to to reach her target LDL less than 55.  Would  recommend adding Repatha.  Will reach out for prior authorization.  This may also help lower LP(a).  She would not qualify for our upcoming preventative LP(a) lowering trial because of prior coronary intervention.  That being said, however she may benefit from specific LP(a) lowering agents in the next few years if they are FDA approved and on the market.  Given her labile blood pressures I have referred her to our hypertension clinic per her request.  Thanks again for the kind referral.  Vinie KYM Maxcy, MD, [facility], FNLA, FACP  [place]  [facility]  Medical Director of the [facility] &  Cardiovascular Risk Reduction Clinic Diplomate of the American Board of Clinical Lipidology Attending Cardiologist  Direct Dial: [phone]  Fax: [phone]  Website:  [email or web address] Vinie BROCKS Brittane Grudzinski 03/13/2024, 10:41 AM

## 2024-03-13 NOTE — Telephone Encounter (Signed)
 Pharmacy Patient Advocate Encounter  Received notification from [facility] that Prior Authorization for Repatha has been APPROVED from 03/13/24 to 03/13/25. Ran test claim, Copay is $0.00- one month. This test claim was processed through [facility]- copay amounts may vary at other pharmacies due to pharmacy/plan contracts, or as the patient moves through the different stages of their insurance plan.   PA #/Case ID/Reference #: [id number]

## 2024-03-20 ENCOUNTER — Telehealth (HOSPITAL_BASED_OUTPATIENT_CLINIC_OR_DEPARTMENT_OTHER): Payer: Self-pay | Admitting: *Deleted

## 2024-03-20 NOTE — Telephone Encounter (Signed)
-----   Message from Dallas S sent at 03/13/2024 11:59 AM EDT ----- Regarding: RE: REFERRAL TO HYPERTENSION CLINIC PER HILTY She did stop by, new referrals have to be scheduled by the scheduling team helane Hopkins, whom is also included in this message.  Sorry! ----- Message ----- From: Gladis Porter HERO, LPN Sent: [id number]/[id number]  11:52 AM EDT To: Hopkins Burrow; Alan Paras; Leita A Hal# Subject: REFERRAL TO HYPERTENSION CLINIC PER HILTY      Dr. Mona saw this pt in clinic today and referred her to HTN Clinic  I told her to stop by to see you guys, but I don't think she did.  Referral is in the system  Can you guys give her a call back to arrange this appt and let me know when you do?  Thanks, [employer]

## 2024-03-20 NOTE — Telephone Encounter (Signed)
 03/14/24 Called 1x, LVMTCB to schedule HTN clinic consult. yo

## 2024-03-20 NOTE — Progress Notes (Deleted)
 NEUROLOGY FOLLOW UP OFFICE NOTE  Carla West [id number]  Assessment/Plan:   Migraine without aura, without status migrainosus, not intractable Hypertension Coronary artery disease    Migraine prevention:  Plan to start Emgality .  Would avoid Aimovig as she has history of hypertension that has been difficult to control Migraine rescue:  She will try samples of Ubrelvy with Zofran  ODT 4mg .  She will stop sumatriptan  due to coronary artery disease Limit use of pain relievers to no more than 2 days out of week to prevent risk of rebound or medication-overuse headache. Keep headache diary Follow up 6 months.    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  History supplemented by ED note and CT head personally reviewed.  UPDATE: Last seen in initial consultation in August 2024.  At that time, started on Emgality  and provided samples of Ubrelvy.  ***  Due to worsening headaches, she had a CT of head on 01/22/2024 which was unremarkable.  Intensity:  *** Duration:  *** Frequency:  *** Frequency of abortive medication: *** Current NSAIDS/analgesics:  ASA 81mg , Excedrin Migraine Current triptans:  sumatriptan  100mg  Current ergotamine:  none Current anti-emetic:  Zofran -ODT 4mg  *** Current muscle relaxants:  methocarbamol  500mg  Current Antihypertensive medications:  metoprolol  succinate 50mg , losartan , amlodipine , Maxzide  Current Antidepressant medications:  trazodone  50mg  at bedtime PRN (insomnia) Current Anticonvulsant medications:  none Current anti-CGRP:  none Current Vitamins/Herbal/Supplements:  D Current Antihistamines/Decongestants:  none Other therapy:  none   Caffeine :  1 large cup of coffee in morning, sometimes a couple of sips to treat a headache.  Sometimes sweet tea Alcohol:  rarely  Diet:  40 oz water  daily at most.  No soda.  Does not skip meals Exercise:  no exercise since July due to migraines Depression:  stable; Anxiety:  improved Other pain:  neck pain Sleep hygiene:  Trouble staying asleep.  Wakes up frequently.  Total 5 hours a night.  HISTORY: Onset:  in her 10s.  Off and on throughout the years.  Returned in July 2024.   Location:  frontal/temples bilaterally Quality:  pounding  Intensity:  4-5/10 if treated early, otherwise 8-9/10.   Aura:  absent Prodrome:  nausea, lightheadedness Postdrome:  feels drugged and fatigued Associated symptoms:  nausea, vomiting, photophobia, phonophobia, osmophobia, left eye twitching, bilateral eye drooping, tremulous.  She denies associated unilateral numbness or weakness. Duration:  if treated, aborts fairly quickly, otherwise all day Frequency:  once a week Frequency of abortive medication: 2 to 3 days a week Triggers/aggravating factors:  sleep deprivation, poor diet, skipped meals, stress, alcohol Relieving factors:  rest, taking abortive medication early (Excedrin or sumatriptan ), coffee Activity:  movement and activity aggravates  She takes sumatriptan  which is helpful.  She was prescribed sumatriptan  despite having coronary artery disease because there possibly may be less risk as her cardiac disease is thought to be secondary to thyrotoxicosis.  Prior workup includes CT head from 01/14/2016, personally reviewed, which was normal.   Past NSAIDS/analgesics:  Fioricet, ibuprofen , naproxen, diclofenac Past abortive triptans:  rizatriptan, almotriptan , zolmitriptan  Past abortive ergotamine:  none Past muscle relaxants:  cyclobenzaprine , tizanidine  Past anti-emetic:  promethazine  Past antihypertensive medications:  lisinopril , clonidine , hydralazine  Past antidepressant medications:  amitriptyline  Past anticonvulsant medications:  topiramate, Depakote Past anti-CGRP:  Emgality  *** Past vitamins/Herbal/Supplements:  none Past  antihistamines/decongestants:  none Other past therapies:  none  Family history of migraines:  son  PAST MEDICAL HISTORY: Past Medical History:  Diagnosis Date   Abnormal Pap smear of cervix 08/03/2015   Anxiety    Benign essential HTN 08/03/2015   Depression    Graves disease    History of chicken pox    Migraine headache 08/03/2015    MEDICATIONS: Current Outpatient Medications on File Prior to Visit  Medication Sig Dispense Refill   amLODipine  (NORVASC ) 5 MG tablet Take 1 tablet (5 mg total) by mouth daily. 90 tablet 3   aspirin  EC 81 MG tablet Take 1 tablet (81 mg total) by mouth daily. 90 tablet 3   atorvastatin  (LIPITOR ) 80 MG tablet Take 1 tablet (80 mg total) by mouth daily. 90 tablet 3   Evolocumab (REPATHA SURECLICK) 140 MG/ML SOAJ Inject 140 mg into the skin every 14 (fourteen) days. 2 mL 6   ezetimibe  (ZETIA ) 10 MG tablet Take 1 tablet (10 mg total) by mouth daily. 90 tablet 3   hydrOXYzine  (ATARAX ) 25 MG tablet TAKE 1 TABLET BY MOUTH EVERY 6 (SIX) HOURS AS NEEDED FOR ANXIETY. TAKE 1 TABLET BY MOUTH EVERY DAY AT BEDTIME FOR INSOMNIA 90 tablet 3   losartan  (COZAAR ) 50 MG tablet TAKE 1 TABLET(50 MG) BY MOUTH DAILY 90 tablet 3   methimazole  (TAPAZOLE ) 10 MG tablet Take 1 tablet (10 mg total) by mouth daily. 90 tablet 3   methocarbamol  (ROBAXIN ) 500 MG tablet Take 1 tablet (500 mg total) by mouth 2 (two) times daily. 20 tablet 0   metoprolol  succinate (TOPROL -XL) 50 MG 24 hr tablet Take 1 tablet (50 mg total) by mouth daily. Take with or immediately following a meal. 90 tablet 3   ondansetron  (ZOFRAN -ODT) 4 MG disintegrating tablet Take 1 tablet (4 mg total) by mouth every 8 (eight) hours as needed. 20 tablet 5   promethazine  (PHENERGAN ) 25 MG tablet Take 25 mg by mouth every 8 (eight) hours as needed for vomiting or nausea.     traZODone  (DESYREL ) 50 MG tablet Take 1 tablet (50 mg total) by mouth at bedtime as needed for sleep. Take one tablet (50mg ) by mouth at bedtime as  needed for sleep. 30 tablet 5   triamterene -hydrochlorothiazide  (MAXZIDE -25) 37.5-25 MG tablet Take 1 tablet by mouth daily. 90 tablet 3   No current facility-administered medications on file prior to visit.    ALLERGIES: Allergies  Allergen Reactions   Elavil  [Amitriptyline  Hcl] Other (See Comments)    Jittery..the patient states she has not had any side effects     FAMILY HISTORY: Family History  Problem Relation Age of Onset   Hypertension Mother    Heart disease Mother        in late 66s   Hyperlipidemia Father    Stroke Father        later in life   Breast cancer Maternal Grandmother    Hyperlipidemia Sister    Other Brother        plan crash in [employer]   Migraines Son    Heart disease Brother        CHF, arrythmia   Liver disease Brother        alcohol   Hypertension Brother    Gout Brother    Hypertension Brother       Objective:  *** General: No acute distress.  Patient appears ***-groomed.   Head:  Normocephalic/atraumatic Eyes:  Fundi examined but not visualized Neck: supple, no paraspinal tenderness, full range of motion Heart:  Regular  rate and rhythm Neurological Exam: alert and oriented.  Speech fluent and not dysarthric, language intact.  CN II-XII intact. Bulk and tone normal, muscle strength 5/5 throughout.  Sensation to light touch intact.  Deep tendon reflexes 2+ throughout, toes downgoing.  Finger to nose testing intact.  Gait normal, Romberg negative.   Juliene Dunnings, DO  CC: ***

## 2024-03-21 ENCOUNTER — Ambulatory Visit: Admitting: Neurology

## 2024-03-27 ENCOUNTER — Encounter: Admitting: Family Medicine

## 2024-04-05 ENCOUNTER — Institutional Professional Consult (permissible substitution) (HOSPITAL_BASED_OUTPATIENT_CLINIC_OR_DEPARTMENT_OTHER): Admitting: Internal Medicine

## 2024-04-20 NOTE — Progress Notes (Deleted)
 NEUROLOGY FOLLOW UP OFFICE NOTE  Raelynn Corron [id number]  Assessment/Plan:   Migraine without aura, without status migrainosus, not intractable Hypertension Coronary artery disease    Migraine prevention:  Plan to start Emgality .  Would avoid Aimovig as she has history of hypertension that has been difficult to control Migraine rescue:  She will try samples of Ubrelvy with Zofran  ODT 4mg .  She will stop sumatriptan  due to coronary artery disease Limit use of pain relievers to no more than 2 days out of week to prevent risk of rebound or medication-overuse headache. Keep headache diary Follow up 6 months.    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  History supplemented by ED note and CT head personally reviewed.  UPDATE: Last seen in initial consultation in August 2024.  At that time, started on Emgality  and provided samples of Ubrelvy.  ***  Due to worsening headaches, she had a CT of head on 01/22/2024 which was unremarkable.  Intensity:  *** Duration:  *** Frequency:  *** Frequency of abortive medication: *** Current NSAIDS/analgesics:  ASA 81mg , Excedrin Migraine Current triptans:  sumatriptan  100mg  Current ergotamine:  none Current anti-emetic:  Zofran -ODT 4mg  *** Current muscle relaxants:  methocarbamol  500mg  Current Antihypertensive medications:  metoprolol  succinate 50mg , losartan , amlodipine , Maxzide  Current Antidepressant medications:  trazodone  50mg  at bedtime PRN (insomnia) Current Anticonvulsant medications:  none Current anti-CGRP:  none Current Vitamins/Herbal/Supplements:  D Current Antihistamines/Decongestants:  none Other therapy:  none   Caffeine :  1 large cup of coffee in morning, sometimes a couple of sips to treat a headache.  Sometimes sweet tea Alcohol:  rarely  Diet:  40 oz water  daily at most.  No soda.  Does not skip meals Exercise:  no exercise since July due to migraines Depression:  stable; Anxiety:  improved Other pain:  neck pain Sleep hygiene:  Trouble staying asleep.  Wakes up frequently.  Total 5 hours a night.  HISTORY: Onset:  in her 18s.  Off and on throughout the years.  Returned in July 2024.   Location:  frontal/temples bilaterally Quality:  pounding  Intensity:  4-5/10 if treated early, otherwise 8-9/10.   Aura:  absent Prodrome:  nausea, lightheadedness Postdrome:  feels drugged and fatigued Associated symptoms:  nausea, vomiting, photophobia, phonophobia, osmophobia, left eye twitching, bilateral eye drooping, tremulous.  She denies associated unilateral numbness or weakness. Duration:  if treated, aborts fairly quickly, otherwise all day Frequency:  once a week Frequency of abortive medication: 2 to 3 days a week Triggers/aggravating factors:  sleep deprivation, poor diet, skipped meals, stress, alcohol Relieving factors:  rest, taking abortive medication early (Excedrin or sumatriptan ), coffee Activity:  movement and activity aggravates  She takes sumatriptan  which is helpful.  She was prescribed sumatriptan  despite having coronary artery disease because there possibly may be less risk as her cardiac disease is thought to be secondary to thyrotoxicosis.  Prior workup includes CT head from 01/14/2016, personally reviewed, which was normal.   Past NSAIDS/analgesics:  Fioricet, ibuprofen , naproxen, diclofenac Past abortive triptans:  rizatriptan, almotriptan , zolmitriptan  Past abortive ergotamine:  none Past muscle relaxants:  cyclobenzaprine , tizanidine  Past anti-emetic:  promethazine  Past antihypertensive medications:  lisinopril , clonidine , hydralazine  Past antidepressant medications:  amitriptyline  Past anticonvulsant medications:  topiramate, Depakote Past anti-CGRP:  Emgality  *** Past vitamins/Herbal/Supplements:  none Past  antihistamines/decongestants:  none Other past therapies:  none  Family history of migraines:  son  PAST MEDICAL HISTORY: Past Medical History:  Diagnosis Date   Abnormal Pap smear of cervix 08/03/2015   Anxiety    Benign essential HTN 08/03/2015   Depression    Graves disease    History of chicken pox    Migraine headache 08/03/2015    MEDICATIONS: Current Outpatient Medications on File Prior to Visit  Medication Sig Dispense Refill   amLODipine  (NORVASC ) 5 MG tablet Take 1 tablet (5 mg total) by mouth daily. 90 tablet 3   aspirin  EC 81 MG tablet Take 1 tablet (81 mg total) by mouth daily. 90 tablet 3   atorvastatin  (LIPITOR ) 80 MG tablet Take 1 tablet (80 mg total) by mouth daily. 90 tablet 3   Evolocumab (REPATHA SURECLICK) 140 MG/ML SOAJ Inject 140 mg into the skin every 14 (fourteen) days. 2 mL 6   ezetimibe  (ZETIA ) 10 MG tablet Take 1 tablet (10 mg total) by mouth daily. 90 tablet 3   hydrOXYzine  (ATARAX ) 25 MG tablet TAKE 1 TABLET BY MOUTH EVERY 6 (SIX) HOURS AS NEEDED FOR ANXIETY. TAKE 1 TABLET BY MOUTH EVERY DAY AT BEDTIME FOR INSOMNIA 90 tablet 3   losartan  (COZAAR ) 50 MG tablet TAKE 1 TABLET(50 MG) BY MOUTH DAILY 90 tablet 3   methimazole  (TAPAZOLE ) 10 MG tablet Take 1 tablet (10 mg total) by mouth daily. 90 tablet 3   methocarbamol  (ROBAXIN ) 500 MG tablet Take 1 tablet (500 mg total) by mouth 2 (two) times daily. 20 tablet 0   metoprolol  succinate (TOPROL -XL) 50 MG 24 hr tablet Take 1 tablet (50 mg total) by mouth daily. Take with or immediately following a meal. 90 tablet 3   ondansetron  (ZOFRAN -ODT) 4 MG disintegrating tablet Take 1 tablet (4 mg total) by mouth every 8 (eight) hours as needed. 20 tablet 5   promethazine  (PHENERGAN ) 25 MG tablet Take 25 mg by mouth every 8 (eight) hours as needed for vomiting or nausea.     traZODone  (DESYREL ) 50 MG tablet Take 1 tablet (50 mg total) by mouth at bedtime as needed for sleep. Take one tablet (50mg ) by mouth at bedtime as  needed for sleep. 30 tablet 5   triamterene -hydrochlorothiazide  (MAXZIDE -25) 37.5-25 MG tablet Take 1 tablet by mouth daily. 90 tablet 3   No current facility-administered medications on file prior to visit.    ALLERGIES: Allergies  Allergen Reactions   Elavil  [Amitriptyline  Hcl] Other (See Comments)    Jittery..the patient states she has not had any side effects     FAMILY HISTORY: Family History  Problem Relation Age of Onset   Hypertension Mother    Heart disease Mother        in late 104s   Hyperlipidemia Father    Stroke Father        later in life   Breast cancer Maternal Grandmother    Hyperlipidemia Sister    Other Brother        plan crash in [employer]   Migraines Son    Heart disease Brother        CHF, arrythmia   Liver disease Brother        alcohol   Hypertension Brother    Gout Brother    Hypertension Brother       Objective:  *** General: No acute distress.  Patient appears ***-groomed.   Head:  Normocephalic/atraumatic Eyes:  Fundi examined but not visualized Neck: supple, no paraspinal tenderness, full range of motion Heart:  Regular  rate and rhythm Neurological Exam: alert and oriented.  Speech fluent and not dysarthric, language intact.  CN II-XII intact. Bulk and tone normal, muscle strength 5/5 throughout.  Sensation to light touch intact.  Deep tendon reflexes 2+ throughout, toes downgoing.  Finger to nose testing intact.  Gait normal, Romberg negative.   Juliene Dunnings, DO  CC: Garnette MALVA Lukes, MD

## 2024-04-26 ENCOUNTER — Ambulatory Visit: Admitting: Neurology

## 2024-05-17 ENCOUNTER — Encounter: Payer: Self-pay | Admitting: Family Medicine

## 2024-05-17 ENCOUNTER — Ambulatory Visit: Admitting: Family Medicine

## 2024-05-17 ENCOUNTER — Ambulatory Visit: Payer: Self-pay | Admitting: Family Medicine

## 2024-05-17 VITALS — BP 118/72 | HR 52 | Temp 98.0°F | Ht 67.0 in | Wt 166.2 lb

## 2024-05-17 DIAGNOSIS — G43809 Other migraine, not intractable, without status migrainosus: Secondary | ICD-10-CM

## 2024-05-17 DIAGNOSIS — R739 Hyperglycemia, unspecified: Secondary | ICD-10-CM | POA: Diagnosis not present

## 2024-05-17 DIAGNOSIS — Z23 Encounter for immunization: Secondary | ICD-10-CM | POA: Diagnosis not present

## 2024-05-17 DIAGNOSIS — I2510 Atherosclerotic heart disease of native coronary artery without angina pectoris: Secondary | ICD-10-CM

## 2024-05-17 DIAGNOSIS — I10 Essential (primary) hypertension: Secondary | ICD-10-CM | POA: Diagnosis not present

## 2024-05-17 DIAGNOSIS — Z131 Encounter for screening for diabetes mellitus: Secondary | ICD-10-CM | POA: Diagnosis not present

## 2024-05-17 DIAGNOSIS — Z780 Asymptomatic menopausal state: Secondary | ICD-10-CM

## 2024-05-17 DIAGNOSIS — F5101 Primary insomnia: Secondary | ICD-10-CM | POA: Diagnosis not present

## 2024-05-17 LAB — CBC WITH DIFFERENTIAL/PLATELET
Basophils Absolute: 0 K/uL (ref 0.0–0.1)
Basophils Relative: 1.2 % (ref 0.0–3.0)
Eosinophils Absolute: 0 K/uL (ref 0.0–0.7)
Eosinophils Relative: 1 % (ref 0.0–5.0)
HCT: 37.5 % (ref 36.0–46.0)
Hemoglobin: 12.4 g/dL (ref 12.0–15.0)
Lymphocytes Relative: 27.7 % (ref 12.0–46.0)
Lymphs Abs: 0.9 K/uL (ref 0.7–4.0)
MCHC: 33.1 g/dL (ref 30.0–36.0)
MCV: 87.9 fl (ref 78.0–100.0)
Monocytes Absolute: 0.3 K/uL (ref 0.1–1.0)
Monocytes Relative: 9 % (ref 3.0–12.0)
Neutro Abs: 2 K/uL (ref 1.4–7.7)
Neutrophils Relative %: 61.1 % (ref 43.0–77.0)
Platelets: 259 K/uL (ref 150.0–400.0)
RBC: 4.27 Mil/uL (ref 3.87–5.11)
RDW: 14.8 % (ref 11.5–15.5)
WBC: 3.2 K/uL — ABNORMAL LOW (ref 4.0–10.5)

## 2024-05-17 LAB — COMPREHENSIVE METABOLIC PANEL WITH GFR
ALT: 14 U/L (ref 0–35)
AST: 17 U/L (ref 0–37)
Albumin: 4.4 g/dL (ref 3.5–5.2)
Alkaline Phosphatase: 61 U/L (ref 39–117)
BUN: 21 mg/dL (ref 6–23)
CO2: 26 meq/L (ref 19–32)
Calcium: 9.3 mg/dL (ref 8.4–10.5)
Chloride: 104 meq/L (ref 96–112)
Creatinine, Ser: 1.02 mg/dL (ref 0.40–1.20)
GFR: 59.46 mL/min — ABNORMAL LOW (ref >60.00)
Glucose, Bld: 86 mg/dL (ref 70–99)
Potassium: 3.9 meq/L (ref 3.5–5.1)
Sodium: 140 meq/L (ref 135–145)
Total Bilirubin: 0.5 mg/dL (ref 0.2–1.2)
Total Protein: 6.8 g/dL (ref 6.0–8.3)

## 2024-05-17 LAB — MICROALBUMIN / CREATININE URINE RATIO
Creatinine,U: 190.2 mg/dL
Microalb Creat Ratio: 16.9 mg/g (ref 0.0–30.0)
Microalb, Ur: 3.2 mg/dL — ABNORMAL HIGH (ref 0.0–1.9)

## 2024-05-17 LAB — HEMOGLOBIN A1C: Hgb A1c MFr Bld: 5.7 % (ref 4.6–6.5)

## 2024-05-17 MED ORDER — TRAZODONE HCL 50 MG PO TABS: 75.0000 mg | ORAL_TABLET | Freq: Every evening | ORAL | 3 refills | Status: AC | PRN

## 2024-05-17 NOTE — Progress Notes (Signed)
 Phone [phone] In person visit   Subjective:   Carla West is a 63 y.o. year old very pleasant female patient who presents for/with See problem oriented charting Chief Complaint  Patient presents with   Medical Management of Chronic Issues    6 month follow up;    Insomnia    Past Medical History-  Patient Active Problem List   Diagnosis Date Noted   CAD (coronary artery disease) 11/07/2020    Priority: High   Hyperthyroidism 02/26/2019    Priority: High   Essential hypertension 08/03/2015    Priority: High   Vitamin D  deficiency 11/16/2023    Priority: Medium    Hyperglycemia 08/05/2022    Priority: Medium    Palpitations 04/11/2018    Priority: Medium    Hyperlipidemia 12/28/2017    Priority: Medium    Osteopenia of lumbar spine 12/07/2017    Priority: Medium    Migraine headache 08/03/2015    Priority: Medium    Skin lesion 11/05/2020    Priority: Low   Abnormal Pap smear of cervix 08/03/2015    Priority: Low   Intractable nausea and vomiting 01/21/2024    Medications- reviewed and updated Current Outpatient Medications  Medication Sig Dispense Refill   aspirin  EC 81 MG tablet Take 1 tablet (81 mg total) by mouth daily. 90 tablet 3   atorvastatin  (LIPITOR ) 80 MG tablet Take 1 tablet (80 mg total) by mouth daily. 90 tablet 3   Evolocumab  (REPATHA  SURECLICK) 140 MG/ML SOAJ Inject 140 mg into the skin every 14 (fourteen) days. 2 mL 6   ezetimibe  (ZETIA ) 10 MG tablet Take 1 tablet (10 mg total) by mouth daily. 90 tablet 3   hydrOXYzine  (ATARAX ) 25 MG tablet TAKE 1 TABLET BY MOUTH EVERY 6 (SIX) HOURS AS NEEDED FOR ANXIETY. TAKE 1 TABLET BY MOUTH EVERY DAY AT BEDTIME FOR INSOMNIA 90 tablet 3   losartan  (COZAAR ) 50 MG tablet TAKE 1 TABLET(50 MG) BY MOUTH DAILY 90 tablet 3   methimazole  (TAPAZOLE ) 10 MG tablet Take 1 tablet (10 mg total) by mouth daily. 90 tablet 3   methocarbamol  (ROBAXIN ) 500 MG tablet Take 1 tablet (500 mg total) by mouth 2 (two)  times daily. 20 tablet 0   metoprolol  succinate (TOPROL -XL) 50 MG 24 hr tablet Take 1 tablet (50 mg total) by mouth daily. Take with or immediately following a meal. 90 tablet 3   ondansetron  (ZOFRAN -ODT) 4 MG disintegrating tablet Take 1 tablet (4 mg total) by mouth every 8 (eight) hours as needed. 20 tablet 5   promethazine  (PHENERGAN ) 25 MG tablet Take 25 mg by mouth every 8 (eight) hours as needed for vomiting or nausea.     SUMAtriptan  (IMITREX ) 100 MG tablet Take by mouth.     triamterene -hydrochlorothiazide  (MAXZIDE -25) 37.5-25 MG tablet Take 1 tablet by mouth daily. 90 tablet 3   traZODone  (DESYREL ) 50 MG tablet Take 1.5-2 tablets (75-100 mg total) by mouth at bedtime as needed for sleep. Take one tablet (50mg ) by mouth at bedtime as needed for sleep. 180 tablet 3   No current facility-administered medications for this visit.     Objective:  BP 118/72 (BP Location: Left Arm, Patient Position: Sitting, Cuff Size: Normal)   Pulse (!) 52   Temp 98 F (36.7 C) (Temporal)   Ht 5' 7 (1.702 m)   Wt 166 lb 3.2 oz (75.4 kg)   SpO2 97%   BMI 26.03 kg/m  Gen: NAD, resting comfortably CV: RRR no murmurs rubs  or gallops Lungs: CTAB no crackles, wheeze, rhonchi Ext: no edema Skin: warm, dry     Assessment and Plan   #health maintenance-plan annual influenza vaccination after labs today.  Ended up with emergency department visit after last influenza vaccination so we are obviously opting out of that -Agrees to update bone density  # Prior nausea and vomiting in August-concern migraine related to abdominal migraine related.  Consideration of restarting Emgality  but she had been on previously-we had referred back to Dr. Skeet - no further issues thankfully but she plans to schedule follow up regardless  # Insomnia S: last visit we refilled hydroxyzine  which has been helpful or alternatively uses trazodone .  Hydroxyzine  also works for daytime anxiety if needed - recently has not used  the hydroxyzine  and prefers not to A/P: Poor control.  Considered ramelteon versus increasing trazodone  and we opted for trazodone  75 to 100 mg as next up  #CAD with history of stent 02/27/2019  At time of active graves disease #hyperlipidemia-LDL goal under 70. LPA high  S: Medication: aspirin  81mg , atorvastatin  80mg , zetia  added 11/07/2020 -Repatha  140 mg every 2 weeks started September 2025 by Dr. [facility] clinic -No chest pain or shortness of breath  A/P: CAD asymptomatic-continue current medications Lipids suspect improving on Repatha  but has only had 1 injection so we will hold off on rechecking at this time  #Resistant hypertension- hyperthyroidism possibly contributing # Microalbuminuria noted June 2025  S: medication: losartan  50mg  AM with microalbuminuria, metoprolol  50mg  XR PM, triamterene -hctz 37.5-25mg  PM, amlodipine  restart mid 2025 at 5 mg- prior 10 mg but blood pressure came down -normal renal arteries 03/12/20 A/P: Blood pressure very well-controlled today!  Continue current medication For microalbuminuria update UACR today-hopefully improving on losartan   #hyperthyroidism/graves disease-follows with Dr. Sam S: compliant On thyroid  medication-methimazole  10mg   A/P: Well-controlled on most recent check-continue current medicine  # Migraines-established with Dr. Skeet.  Symptoms seem to still be better but she has been off of Emgality -she plans to schedule follow-up.  For now continue sumatriptan -some risk with prior CAD/stent though primarily thought related to thyroid  and could be LPA related as well but I still think it would be great if we could transition her back to alternates  # Hyperglycemia/insulin resistance/prediabetes- peak a1c of 6.1 S:  Medication: None Lab Results  Component Value Date   HGBA1C 6.1 11/10/2023   HGBA1C 6.0 02/03/2023   HGBA1C 6.0 08/05/2022  A/P: hopefully stable- update a1c today. Continue without meds for now   Recommended follow up:  Return for next already scheduled visit or sooner if needed. Future Appointments  Date Time Provider Department Center  06/21/2024  8:25 AM Vannie Reche RAMAN, NP [facility] [phone] [facility]  11/22/2024  8:00 AM Katrinka Garnette KIDD, MD [facility]    Lab/Order associations:   ICD-10-CM   1. Primary insomnia  F51.01     2. Postmenopausal  Z78.0 DG Bone Density [[id number]]    3. Hyperglycemia  R73.9 Hemoglobin A1c    4. Screening for diabetes mellitus  Z13.1 Hemoglobin A1c    5. Essential hypertension  I10 Microalbumin / creatinine urine ratio    CBC with Differential/Platelet    Comprehensive metabolic panel with GFR    6. Coronary artery disease involving native coronary artery of native heart without angina pectoris  I25.10 CBC with Differential/Platelet    Comprehensive metabolic panel with GFR    7. Other migraine without status migrainosus, not intractable  G43.809 CBC with Differential/Platelet    Comprehensive metabolic  panel with GFR      Meds ordered this encounter  Medications   traZODone  (DESYREL ) 50 MG tablet    Sig: Take 1.5-2 tablets (75-100 mg total) by mouth at bedtime as needed for sleep. Take one tablet (50mg ) by mouth at bedtime as needed for sleep.    Dispense:  180 tablet    Refill:  3    Return precautions advised.  Garnette Lukes, MD

## 2024-05-17 NOTE — Addendum Note (Signed)
 Addended by: Colvin Blatt on: 05/17/2024 09:36 AM   Modules accepted: Orders

## 2024-05-17 NOTE — Patient Instructions (Addendum)
 Health Maintenance Due  Topic Date Due   Bone Density Scan  04/28/2021  Schedule your bone density test at check out desk.  - located [address] across the [address] from [place] - in the basement - you DO NEED an appointment for the bone density tests.   Trial 75-100 mg of trazodone  50 mg- so 1.5 to 2 tablets. If not effective let me know- I have an alternative in mind  Schedule follow up with Dr. Skeet  Flu shot before you leave  Please stop by lab before you go If you have mychart- we will send your results within 3 business days of us  receiving them.  If you do not have mychart- we will call you about results within 5 business days of us  receiving them.  *please also note that you will see labs on mychart as soon as they post. I will later go in and write notes on them- will say notes from Dr. Katrinka   Recommended follow up: Return for next already scheduled visit or sooner if needed.

## 2024-05-18 NOTE — Progress Notes (Signed)
 Left detailed message per DPR, also informed patient Dr. Katrinka sent message via MyChart.

## 2024-06-13 ENCOUNTER — Ambulatory Visit (INDEPENDENT_AMBULATORY_CARE_PROVIDER_SITE_OTHER): Admitting: Family

## 2024-06-13 ENCOUNTER — Encounter (HOSPITAL_BASED_OUTPATIENT_CLINIC_OR_DEPARTMENT_OTHER): Payer: Self-pay | Admitting: Family

## 2024-06-13 VITALS — BP 165/107 | HR 67 | Ht 67.0 in | Wt 170.6 lb

## 2024-06-13 DIAGNOSIS — E7849 Other hyperlipidemia: Secondary | ICD-10-CM | POA: Diagnosis not present

## 2024-06-13 DIAGNOSIS — R0683 Snoring: Secondary | ICD-10-CM

## 2024-06-13 DIAGNOSIS — R400 Somnolence: Secondary | ICD-10-CM

## 2024-06-13 DIAGNOSIS — I10 Essential (primary) hypertension: Secondary | ICD-10-CM | POA: Diagnosis not present

## 2024-06-13 MED ORDER — REPATHA SURECLICK 140 MG/ML ~~LOC~~ SOAJ: 140.0000 mg | SUBCUTANEOUS | 6 refills | Status: AC

## 2024-06-13 MED ORDER — OLMESARTAN MEDOXOMIL 40 MG PO TABS: 40.0000 mg | ORAL_TABLET | Freq: Every day | ORAL | 2 refills | Status: AC

## 2024-06-13 NOTE — Progress Notes (Signed)
 "  Advanced Hypertension Clinic Initial Assessment:    Date:  06/13/2024   ID:  Carla West, DOB 1960/07/22, MRN [id number]  PCP:  Katrinka Garnette KIDD, MD  Cardiologist:  Darryle ONEIDA Decent, MD  Nephrologist:  Referring MD: Mona Vinie BROCKS, MD   CC: Hypertension  History of Present Illness:    Carla West is a 63 y.o. female with a hx of CAD s/p PCI to LCx 2020, familial hyperlipidemia, labile hypertension, Grave's disease here to establish care in the Advanced Hypertension Clinic.   Seen 03/13/24 by Dr. Mona in lipid clinic noting labile blood pressure and referred for further blood pressure management.   Carla West was diagnosed with hypertension in her 89s and has been difficult to control.Blood pressure checked with arm cuff at home which was provided by her insurance company. Readings have been 150-160/80-90. she reports tobacco use never.. 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 Does note having to take breaks in her aerobics or pickleball due to headaches.  she eats at home or meals prepared by family and does follow low sodium diet. She limits pork to 1-2 times per week.   Notes falling asleep easily but difficulty staying asleep, often waking up at 3am. Previously tried sleeping pills with her PCP which she uses about once per week. Reports sleep study >10 years ago. She does snore at night wand wakes feeling tired.   Previous antihypertensives: Amlodipine -  BP better controlled, previously discontinued. No adverse effects Lisinopril   - changed to ARB   Past Medical History:  Diagnosis Date   Abnormal Pap smear of cervix 08/03/2015   Anxiety    Benign essential HTN 08/03/2015   Depression    Graves disease    History of chicken pox    Migraine headache 08/03/2015    Past Surgical History:  Procedure Laterality Date   CORONARY STENT INTERVENTION N/A 02/27/2019   Procedure: CORONARY STENT  INTERVENTION;  Surgeon: Verlin Lonni BIRCH, MD;  Location: [facility];  Service: Cardiovascular;  Laterality: N/A;   LEFT HEART CATH AND CORONARY ANGIOGRAPHY N/A 02/27/2019   Procedure: LEFT HEART CATH AND CORONARY ANGIOGRAPHY;  Surgeon: Verlin Lonni BIRCH, MD;  Location: [facility] CV [facility];  Service: Cardiovascular;  Laterality: N/A;   TUBAL LIGATION      Current Medications: Active Medications[1]   Allergies:   Elavil  [amitriptyline  hcl]   Social History   Socioeconomic History   Marital status: Divorced    Spouse name: Not on file   Number of children: Not on file   Years of education: Not on file   Highest education level: Not on file  Occupational History   Not on file  Tobacco Use   Smoking status: Never   Smokeless tobacco: Never  Vaping Use   Vaping status: Never Used  Substance and Sexual Activity   Alcohol use: No   Drug use: No   Sexual activity: Not Currently    Comment:  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[employer]  Other Topics Concern   Not on file  Social History Narrative   Divorced 2018. 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- will be at [employer] and [employer]: Enjoys netflix, going to the [employer]  Social Drivers of Health   Tobacco Use: Low Risk (06/13/2024)   Patient History    Smoking Tobacco Use: Never    Smokeless Tobacco Use: Never    Passive Exposure: Not on file  Financial Resource Strain: Low Risk (06/13/2024)   Overall Financial Resource Strain (CARDIA)    Difficulty of Paying Living Expenses: Not very hard  Food Insecurity: No Food Insecurity (01/22/2024)   Epic    Worried About Programme Researcher, Broadcasting/film/video in the Last Year: Never true    Ran Out of Food in the Last Year: Never true  Transportation Needs: No Transportation Needs (01/22/2024)   Epic    Lack of Transportation (Medical): No     Lack of Transportation (Non-Medical): No  Physical Activity: Insufficiently Active (06/13/2024)   Exercise Vital Sign    Days of Exercise per Week: 3 days    Minutes of Exercise per Session: 40 min  Stress: Stress Concern Present (06/13/2024)   [employer] of Occupational Health - Occupational Stress Questionnaire    Feeling of Stress: Very much  Social Connections: Moderately Isolated (06/13/2024)   Social Connection and Isolation Panel    Frequency of Communication with Friends and Family: More than three times a week    Frequency of Social Gatherings with Friends and Family: More than three times a week    Attends Religious Services: More than 4 times per year    Active Member of Clubs or Organizations: No    Attends Banker Meetings: Never    Marital Status: Divorced  Depression (PHQ2-9): Medium Risk (05/17/2024)   Depression (PHQ2-9)    PHQ-2 Score: 7  Alcohol Screen: Low Risk (06/13/2024)   Alcohol Screen    Last Alcohol Screening Score (AUDIT): 1  Housing: Low Risk (06/13/2024)   Epic    Unable to Pay for Housing in the Last Year: No    Number of Times Moved in the Last Year: 0    Homeless in the Last Year: No  Utilities: Not At Risk (06/13/2024)   Epic    Threatened with loss of utilities: No  Health Literacy: Adequate Health Literacy (06/13/2024)   B1300 Health Literacy    Frequency of need for help with medical instructions: Never     Family History: The patient's family history includes Breast cancer in her maternal grandmother; Gout in her brother; Heart disease in her brother and mother; Hyperlipidemia in her father and sister; Hypertension in her brother, brother, and mother; Liver disease in her brother; Migraines in her son; Other in her brother; Stroke in her father.  ROS:   Please see the history of present illness.     All other systems reviewed and are negative.  EKGs/Labs/Other Studies Reviewed:         Recent Labs: 01/22/2024:  Magnesium  1.9 02/10/2024: TSH 1.09 05/17/2024: ALT 14; BUN 21; Creatinine, Ser 1.02; Hemoglobin 12.4; Platelets 259.0; Potassium 3.9; Sodium 140   Recent Lipid Panel    Component Value Date/Time   CHOL 189 01/22/2024 0341   CHOL 154 04/04/2019 1010   TRIG 18 01/22/2024 0341   HDL 87 01/22/2024 0341   HDL 83 04/04/2019 1010   CHOLHDL 2.2 01/22/2024 0341   VLDL 4 01/22/2024 0341   LDLCALC 98 01/22/2024 0341   LDLCALC 194 (H) 01/01/2022 1631   LDLDIRECT 90.0 11/10/2023 1543    Physical Exam:   VS:  BP (!) 165/107 (BP Location: Left Arm)   Pulse 67   Ht 5' 7 (1.702  m)   Wt 170 lb 9.6 oz (77.4 kg)   SpO2 98%   BMI 26.72 kg/m  , BMI Body mass index is 26.72 kg/m. GENERAL:  Well appearing HEENT: Pupils equal round and reactive, fundi not visualized, oral mucosa unremarkable NECK:  No jugular venous distention, waveform within normal limits, carotid upstroke brisk and symmetric, no bruits, no thyromegaly LYMPHATICS:  No cervical adenopathy LUNGS:  Clear to auscultation bilaterally HEART:  RRR.  PMI not displaced or sustained,S1 and S2 within normal limits, no S3, no S4, no clicks, no rubs, no murmurs ABD:  Flat, positive bowel sounds normal in frequency in pitch, no bruits, no rebound, no guarding, no midline pulsatile mass, no hepatomegaly, no splenomegaly EXT:  2 plus pulses throughout, no edema, no cyanosis no clubbing SKIN:  No rashes no nodules NEURO:  Cranial nerves II through XII grossly intact, motor grossly intact throughout PSYCH:  Cognitively intact, oriented to person place and time  ASSESSMENT/PLAN:    HTN -BP not at goal less than 130/80.   Medication management:  Stop losartan .  Start olmesartan  40 mg daily.   BMP in 1 to 2 weeks for monitoring.   Continue Toprol  50mg  daily, triamterene -hydrochlorothiazide  37.5-25mg  daily. If BP persistently elevated, plan to transition Toprol  to Nebivolol.  MyChart message in 2 weeks to check in on blood pressure.   Discussed  to monitor BP at home at least 2 hours after medications and sitting for 5-10 minutes.  Secondary hypertension workup Renin aldosterone ratio today  Sleep study ordered, as below. If BP persistently difficult to control consider renal artery duplex as last performed 2021 with no stenosis.   Snores / daytime somnolence -STOP-BANG 4.  Itamar home sleep study ordered.  CAD / Familial hyperlipidemia - Stable with no anginal symptoms. No indication for ischemic evaluation.  Seen by Dr. Mona 02/2024 recommended to start Repatha . She was unaware prior auth approved and will pick up from the pharmacy. Lab orders already in place for LPA, NMR after 3 months of treatment  Screening for Secondary Hypertension:     06/13/2024    1:27 PM  Causes  Renovascular HTN Screened     - Comments 02/2020 normal renal arteries  Sleep Apnea Screened     - Comments 05/2024 renal duplex ordered  Thyroid  Disease Screened     - Comments Graves' disease managed by PCP  Pheochromocytoma N/A     - Comments 01/2024 CT normal adrenals  Cushing's Syndrome N/A     - Comments Non-cushingoid appearance  Coarctation of the Aorta N/A     - Comments BP symmetrical  Compliance Screened     - Comments Takes medications routinely    Relevant Labs/Studies:    Latest Ref Rng & Units 05/17/2024    9:10 AM 02/10/2024    3:20 PM 01/22/2024   10:50 AM  Basic Labs  Sodium 135 - 145 mEq/L 140  141  138   Potassium 3.5 - 5.1 mEq/L 3.9  3.8  3.3   Creatinine 0.40 - 1.20 mg/dL 8.97  8.89  8.91        Latest Ref Rng & Units 02/10/2024    3:20 PM 11/10/2023    3:43 PM  Thyroid    TSH 0.40 - 4.50 mIU/L 1.09  2.08                 03/12/2020    8:59 AM  Renovascular   Renal Artery US  Completed Yes   Disposition:  FU with MD/APP/PharmD in 2 months    Medication Adjustments/Labs and Tests Ordered: Current medicines are reviewed at length with the patient today.  Concerns regarding medicines are outlined above.  Orders  Placed This Encounter  Procedures   Aldosterone + renin activity w/ ratio   Basic metabolic panel with GFR   Itamar Sleep Study   Meds ordered this encounter  Medications   olmesartan  (BENICAR ) 40 MG tablet    Sig: Take 1 tablet (40 mg total) by mouth daily.    Dispense:  30 tablet    Refill:  2    Stop Losartan     Supervising Provider:   LONNI SLAIN [[id number]]   Evolocumab  (REPATHA  SURECLICK) 140 MG/ML SOAJ    Sig: Inject 140 mg into the skin every 14 (fourteen) days.    Dispense:  2 mL    Refill:  6    PA#/CaseID/Reference#:[id number]    Supervising Provider:   LONNI SLAIN [[id number]]     Signed, Reche GORMAN Finder, NP  06/13/2024 1:32 PM    [facility]     [1]  Current Meds  Medication Sig   aspirin  EC 81 MG tablet Take 1 tablet (81 mg total) by mouth daily.   atorvastatin  (LIPITOR ) 80 MG tablet Take 1 tablet (80 mg total) by mouth daily.   ezetimibe  (ZETIA ) 10 MG tablet Take 1 tablet (10 mg total) by mouth daily.   hydrOXYzine  (ATARAX ) 25 MG tablet TAKE 1 TABLET BY MOUTH EVERY 6 (SIX) HOURS AS NEEDED FOR ANXIETY. TAKE 1 TABLET BY MOUTH EVERY DAY AT BEDTIME FOR INSOMNIA   methimazole  (TAPAZOLE ) 10 MG tablet Take 1 tablet (10 mg total) by mouth daily.   methocarbamol  (ROBAXIN ) 500 MG tablet Take 1 tablet (500 mg total) by mouth 2 (two) times daily.   metoprolol  succinate (TOPROL -XL) 50 MG 24 hr tablet Take 1 tablet (50 mg total) by mouth daily. Take with or immediately following a meal.   olmesartan  (BENICAR ) 40 MG tablet Take 1 tablet (40 mg total) by mouth daily.   ondansetron  (ZOFRAN -ODT) 4 MG disintegrating tablet Take 1 tablet (4 mg total) by mouth every 8 (eight) hours as needed.   promethazine  (PHENERGAN ) 25 MG tablet Take 25 mg by mouth every 8 (eight) hours as needed for vomiting or nausea.   SUMAtriptan  (IMITREX ) 100 MG tablet Take by mouth.   traZODone  (DESYREL ) 50 MG tablet Take 1.5-2 tablets (75-100 mg total) by  mouth at bedtime as needed for sleep. Take one tablet (50mg ) by mouth at bedtime as needed for sleep.   triamterene -hydrochlorothiazide  (MAXZIDE -25) 37.5-25 MG tablet Take 1 tablet by mouth daily.   [DISCONTINUED] losartan  (COZAAR ) 50 MG tablet TAKE 1 TABLET(50 MG) BY MOUTH DAILY   "

## 2024-06-13 NOTE — Patient Instructions (Addendum)
 Medication Instructions:   STOP Losartan   START Olmesartan  40mg  daily  START Repatha  140mg  subcutaneously every 14 days   Labwork: Your physician recommends that you return for lab work today: renin-aldosterone ratio  Your physician recommends that you return for lab work in 1-2 weeks for BMP   Testing/Procedures:    Follow-Up: Please follow up in 2 months in ADV HTN CLINIC with Dr. Raford, Reche Finder, NP or Allean Mink PharmD    Special Instructions:    Reche GORMAN Finder, NP will send you a MyChart message in 2 weeks to check in on blood pressure  Tips to Measure your Blood Pressure Correctly   Here's what you can do to ensure a correct reading:  Don't drink a caffeinated beverage or smoke during the 30 minutes before the test.  Sit quietly for five minutes before the test begins.  During the measurement, sit in a chair with your feet on the floor and your arm supported so your elbow is at about heart level.  The inflatable part of the cuff should completely cover at least 80% of your upper arm, and the cuff should be placed on bare skin, not over a shirt.  Don't talk during the measurement.  Have your blood pressure measured twice, with a brief break in between. If the readings are different by 5 points or more, have it done a third time.   Blood pressure categories  Blood pressure category SYSTOLIC (upper number)  DIASTOLIC (lower number)  Normal Less than 120 mm Hg and Less than 80 mm Hg  Elevated 120-129 mm Hg and Less than 80 mm Hg  High blood pressure: Stage 1 hypertension 130-139 mm Hg or 80-89 mm Hg  High blood pressure: Stage 2 hypertension 140 mm Hg or higher or 90 mm Hg or higher  Hypertensive crisis (consult your doctor immediately) Higher than 180 mm Hg and/or Higher than 120 mm Hg  Source: American Heart Association and American Stroke Association. For more on getting your blood pressure under control, buy Controlling Your Blood Pressure, a Special  Health Report from [facility].   Blood Pressure Log   Date   Time  Blood Pressure  Position  Example: Nov 1 9 AM 124/78 sitting

## 2024-06-20 ENCOUNTER — Ambulatory Visit (HOSPITAL_BASED_OUTPATIENT_CLINIC_OR_DEPARTMENT_OTHER): Payer: Self-pay | Admitting: Family

## 2024-06-20 LAB — ALDOSTERONE + RENIN ACTIVITY W/ RATIO
Aldos/Renin Ratio: 4.4 (ref 0.0–30.0)
Aldosterone: 2.9 ng/dL (ref 0.0–30.0)
Renin Activity, Plasma: 0.659 ng/mL/h (ref 0.167–5.380)

## 2024-06-21 ENCOUNTER — Institutional Professional Consult (permissible substitution) (HOSPITAL_BASED_OUTPATIENT_CLINIC_OR_DEPARTMENT_OTHER): Admitting: Family

## 2024-06-28 ENCOUNTER — Encounter (HOSPITAL_BASED_OUTPATIENT_CLINIC_OR_DEPARTMENT_OTHER): Payer: Self-pay | Admitting: Cardiology

## 2024-06-28 DIAGNOSIS — R0683 Snoring: Secondary | ICD-10-CM | POA: Diagnosis not present

## 2024-07-02 NOTE — Procedures (Signed)
" ° °  SLEEP STUDY REPORT Patient Information Study Date: 06/28/2024 Patient Name: Carla West Patient ID: [id number] Birth Date: May 13, 1961 Age: 64 Gender: Female BMI: 26.6 (W=170 lb, H=5' 7'') Referring Physician: Reche Finder, NP  TEST DESCRIPTION: Home sleep apnea testing was completed using the WatchPat, a Type 1 device, utilizing peripheral arterial tonometry (PAT), chest movement, actigraphy, pulse oximetry, pulse rate, body position and snore. AHI was calculated with apnea and hypopnea using valid sleep time as the denominator. RDI includes apneas, hypopneas, and RERAs. The data acquired and the scoring of sleep and all associated events were performed in accordance with the recommended standards and specifications as outlined in the AASM Manual for the Scoring of Sleep and Associated Events 2.2.0 (2015).  FINDINGS: 1. No evidence of Obstructive Sleep Apnea with AHI 4/hr. 2. No Central Sleep Apnea. 3. Oxygen desaturations as low as 86%. 4. Mild to moderate snoring was present. O2 sats were < 88% for 0.2 minutes. 5. Total sleep time was 6 hrs and 16 min. 6. 29.6% of total sleep time was spent in REM sleep. 7. Normal sleep onset latency at 10 min. 8. Shortened REM sleep onset latency at 59 min. 9. Total awakenings were 9.  DIAGNOSIS: Normal study with no significant sleep disordered breathing.  RECOMMENDATIONS: 1. Normal study with no significant sleep disordered breathing. 2. Healthy sleep recommendations include: adequate nightly sleep (normal 7-9 hrs/night), avoidance of caffeine  after noon and alcohol near bedtime, and maintaining a sleep environment that is cool, dark and quiet. 3. Weight loss for overweight patients is recommended. 4. Snoring recommendations include: weight loss where appropriate, side sleeping, and avoidance of alcohol before bed. 5. Operation of motor vehicle or dangerous equipment must be avoided when feeling drowsy, excessively sleepy,  or mentally fatigued. 6. An ENT consultation which may be useful for specific causes of and possible treatment of bothersome snoring . 7. Weight loss may be of benefit in reducing the severity of snoring.   Signature: Wilbert Bihari, MD; [facility]; Diplomat, American Board of Sleep Medicine Electronically Signed: 07/02/2024 6:36:32 PM "

## 2024-07-03 ENCOUNTER — Telehealth: Payer: Self-pay | Admitting: *Deleted

## 2024-07-03 NOTE — Telephone Encounter (Signed)
-----   Message from Wilbert Bihari, MD sent at 07/02/2024  6:38 PM EST ----- Please let patient know that sleep study showed no significant sleep apnea.

## 2024-07-03 NOTE — Telephone Encounter (Signed)
 Patient notified of result via her mychart.

## 2024-07-04 ENCOUNTER — Telehealth: Payer: Self-pay | Admitting: Family Medicine

## 2024-07-04 NOTE — Telephone Encounter (Signed)
 LVM for pt to reschedule their June physical since Dr Katrinka will no longer be in office that day.

## 2024-07-15 NOTE — Progress Notes (Unsigned)
 " Cardiology Office Note:  .   Date:  07/17/2024  ID:  Carla West, DOB 01-17-1961, MRN [id number] PCP: Katrinka Garnette KIDD, MD  [facility] Providers Cardiologist:  Darryle ONEIDA Decent, MD   History of Present Illness: .    Chief Complaint  Patient presents with   Follow-up    Carla West is a 64 y.o. female with below history who presents for follow-up.   History of Present Illness   Carla West is a 64 year old female with coronary artery disease, hypertension, and hyperlipidemia who presents for follow-up on her cardiovascular health.  She has a history of coronary artery disease and experienced a non-ST elevation myocardial infarction (non-STEMI) in September 2020, which led to a percutaneous coronary intervention (PCI) to the proximal circumflex artery. She is currently on Lipitor  80 mg and Repatha  for hyperlipidemia, with her most recent LDL at 98, indicating her lipids are not at goal.  Her hypertension is managed with olmesartan  40 mg, triamterene /hydrochlorothiazide , and metoprolol  50 mg. Recent blood pressure readings have been 144/96, with home readings in the 130s to 140s. She was recently switched from losartan  to olmesartan  by Caitlyn, a nurse practitioner. She experiences headaches during regular exercise, which she associates with blood pressure fluctuations.  In August, she was hospitalized for persistent vomiting and elevated blood pressure, initially suspected to be related to drug use, but all tests were clear. She associates these symptoms with migraines, which begin with vomiting and elevated blood pressure.  She has a history of thyroid  issues, previously overactive, and is currently managed with methimazole . She recently underwent a sleep study to evaluate for sleep apnea, but results are pending. She reports difficulty staying asleep and was prescribed trazodone , which she has not been using regularly.  She is actively managing  her diet by avoiding salt and was exercising regularly until mid-January. 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 She does not smoke or consume alcohol.           Problem List: 1. CAD -NSTEMI 02/27/2019: PCI to pLCX 2. HLD -T chol 189, HDL 87, LDL 98, TG 18 -Lpa 264 3. HTN 4. Hyperthyroidism     ROS: All other ROS reviewed and negative. Pertinent positives noted in the HPI.     Studies Reviewed: SABRA       TTE 01/22/2024  1. Left ventricular ejection fraction, by estimation, is 60 to 65%. The  left ventricle has normal function. The left ventricle has no regional  wall motion abnormalities. There is moderate left ventricular hypertrophy.  Left ventricular diastolic  parameters were normal.   2. Right ventricular systolic function is normal. The right ventricular  size is normal. There is normal pulmonary artery systolic pressure.   3. The mitral valve is normal in structure. No evidence of mitral valve  regurgitation. No evidence of mitral stenosis.   4. The aortic valve is tricuspid. Aortic valve regurgitation is not  visualized. No aortic stenosis is present.   5. The inferior vena cava is normal in size with greater than 50%  respiratory variability, suggesting right atrial pressure of 3 mmHg.   Physical Exam:   VS:  BP (!) 144/96   Pulse 67   Ht 5' 7 (1.702 m)   Wt 167 lb 1.6 oz (75.8 kg)   SpO2 97%   BMI 26.17 kg/m    Wt Readings from Last 3 Encounters:  07/17/24 167 lb 1.6 oz (75.8 kg)  06/13/24  170 lb 9.6 oz (77.4 kg)  05/17/24 166 lb 3.2 oz (75.4 kg)    GEN: Well nourished, well developed in no acute distress NECK: No JVD; No carotid bruits CARDIAC: RRR, no murmurs, rubs, gallops RESPIRATORY:  Clear to auscultation without rales, wheezing or rhonchi  ABDOMEN: Soft, non-tender, non-distended EXTREMITIES:  No edema; No deformity  ASSESSMENT AND PLAN: .   Assessment and Plan    Coronary artery disease without angina pectoris Coronary artery  disease stable, no angina symptoms, post-PCI. - Continue aspirin  therapy.  Hypertension Blood pressure elevated at 144/96 mmHg. Current regimen suboptimal. Nebivolol  recommended for better control. - Discontinued metoprolol  succinate. - Initiate nebivolol  10 mg daily. - Monitor blood pressure daily, goal <130/80 mmHg. - Follow up with Caitlyn at month's end for management.  Mixed hyperlipidemia LDL cholesterol at 98 mg/dL, not at goal. Current regimen includes Lipitor , Repatha , and Zetia . No recent lipid panel since Repatha  initiation. - Ordered fasting lipid panel. - Continue Lipitor  80 mg, Repatha , and Zetia  10 mg daily.  History of non-ST elevation myocardial infarction status post PCI Post-PCI status, no angina or dyspnea. Risk factor control emphasized. - Continue current management and monitoring.  Thyrotoxicosis (history of, on treatment) Thyroid  function controlled on methimazole . - Continue methimazole  therapy.                Follow-up: Return in about 1 year (around 07/17/2025).  Signed, Darryle DASEN. Barbaraann, MD, [facility]  [facility]  [address] [place], [place] [address] [phone]  8:46 AM   "

## 2024-07-17 ENCOUNTER — Encounter (HOSPITAL_BASED_OUTPATIENT_CLINIC_OR_DEPARTMENT_OTHER): Payer: Self-pay | Admitting: Cardiovascular Disease

## 2024-07-17 ENCOUNTER — Ambulatory Visit (HOSPITAL_BASED_OUTPATIENT_CLINIC_OR_DEPARTMENT_OTHER): Admitting: Cardiovascular Disease

## 2024-07-17 VITALS — BP 144/96 | HR 67 | Ht 67.0 in | Wt 167.1 lb

## 2024-07-17 DIAGNOSIS — I150 Renovascular hypertension: Secondary | ICD-10-CM

## 2024-07-17 DIAGNOSIS — E782 Mixed hyperlipidemia: Secondary | ICD-10-CM | POA: Diagnosis not present

## 2024-07-17 DIAGNOSIS — I2510 Atherosclerotic heart disease of native coronary artery without angina pectoris: Secondary | ICD-10-CM | POA: Diagnosis not present

## 2024-07-17 MED ORDER — NEBIVOLOL HCL 10 MG PO TABS: 10.0000 mg | ORAL_TABLET | Freq: Every day | ORAL | 3 refills | Status: AC

## 2024-07-17 NOTE — Patient Instructions (Addendum)
 Medication Instructions:  STOP METOPROLOL    START NEBIVOLOL  10 MG DAILY   *If you need a refill on your cardiac medications before your next appointment, please call your pharmacy*  Lab Work: FASTING LIPIDS SOON   If you have labs (blood work) drawn today and your tests are completely normal, you will receive your results only by: MyChart Message (if you have MyChart) OR A paper copy in the mail If you have any lab test that is abnormal or we need to change your treatment, we will call you to review the results.  Testing/Procedures: NONE  Follow-Up: At [facility], you and your health needs are our priority.  As part of our continuing mission to provide you with exceptional heart care, our providers are all part of one team.  This team includes your primary Cardiologist (physician) and Advanced Practice Providers or APPs (Physician Assistants and Nurse Practitioners) who all work together to provide you with the care you need, when you need it.  Your next appointment:   12 month(s)  Provider:   Darryle ONEIDA Decent, MD    KEEP FOLLOW UP AS SCHEDULED WITH CAITLIN   We recommend signing up for the patient portal called MyChart.  Sign up information is provided on this After Visit Summary.  MyChart is used to connect with patients for Virtual Visits (Telemedicine).  Patients are able to view lab/test results, encounter notes, upcoming appointments, etc.  Non-urgent messages can be sent to your provider as well.   To learn more about what you can do with MyChart, go to [email or web address].   Other Instructions MONITOR AND LOG YOUR BLOOD PRESSURE. BRING YOUR LOG AND MACHINE TO FOLLOW UP WITH CAITLIN W NP. YOUR GOAL IS LESS THAN 130/80

## 2024-08-09 ENCOUNTER — Encounter (HOSPITAL_BASED_OUTPATIENT_CLINIC_OR_DEPARTMENT_OTHER): Admitting: Family

## 2024-11-22 ENCOUNTER — Encounter: Admitting: Family Medicine
